# Patient Record
Sex: Male | Born: 1935 | Race: White | Hispanic: No | Marital: Single | Smoking: Former smoker
Health system: Southern US, Community
[De-identification: ages and names within clinical notes are randomized; demographics above are authoritative.]

## PROBLEM LIST (undated history)

## (undated) DIAGNOSIS — IMO0001 Reserved for inherently not codable concepts without codable children: Secondary | ICD-10-CM

## (undated) DIAGNOSIS — C801 Malignant (primary) neoplasm, unspecified: Secondary | ICD-10-CM

## (undated) DIAGNOSIS — K5730 Diverticulosis of large intestine without perforation or abscess without bleeding: Secondary | ICD-10-CM

## (undated) DIAGNOSIS — Z85118 Personal history of other malignant neoplasm of bronchus and lung: Secondary | ICD-10-CM

## (undated) DIAGNOSIS — H9190 Unspecified hearing loss, unspecified ear: Secondary | ICD-10-CM

## (undated) DIAGNOSIS — J449 Chronic obstructive pulmonary disease, unspecified: Secondary | ICD-10-CM

## (undated) DIAGNOSIS — Z87442 Personal history of urinary calculi: Secondary | ICD-10-CM

## (undated) DIAGNOSIS — E559 Vitamin D deficiency, unspecified: Secondary | ICD-10-CM

## (undated) DIAGNOSIS — D494 Neoplasm of unspecified behavior of bladder: Secondary | ICD-10-CM

## (undated) DIAGNOSIS — I499 Cardiac arrhythmia, unspecified: Secondary | ICD-10-CM

## (undated) DIAGNOSIS — T7840XA Allergy, unspecified, initial encounter: Secondary | ICD-10-CM

## (undated) DIAGNOSIS — E785 Hyperlipidemia, unspecified: Secondary | ICD-10-CM

## (undated) DIAGNOSIS — I10 Essential (primary) hypertension: Secondary | ICD-10-CM

## (undated) HISTORY — DX: Cardiac arrhythmia, unspecified: I49.9

## (undated) HISTORY — PX: PROSTATE SURGERY: SHX751

## (undated) HISTORY — PX: OTHER SURGICAL HISTORY: SHX169

## (undated) HISTORY — DX: Diverticulosis of large intestine without perforation or abscess without bleeding: K57.30

## (undated) HISTORY — DX: Essential (primary) hypertension: I10

## (undated) HISTORY — DX: Vitamin D deficiency, unspecified: E55.9

## (undated) HISTORY — DX: Allergy, unspecified, initial encounter: T78.40XA

---

## 1998-10-27 ENCOUNTER — Encounter: Payer: Self-pay | Admitting: Internal Medicine

## 1998-10-27 ENCOUNTER — Ambulatory Visit (HOSPITAL_COMMUNITY): Admission: RE | Admit: 1998-10-27 | Discharge: 1998-10-27 | Payer: Self-pay | Admitting: Internal Medicine

## 1999-11-16 ENCOUNTER — Ambulatory Visit (HOSPITAL_COMMUNITY): Admission: RE | Admit: 1999-11-16 | Discharge: 1999-11-16 | Payer: Self-pay | Admitting: Internal Medicine

## 1999-11-16 ENCOUNTER — Encounter: Payer: Self-pay | Admitting: Internal Medicine

## 2000-11-19 ENCOUNTER — Encounter: Payer: Self-pay | Admitting: Internal Medicine

## 2000-11-19 ENCOUNTER — Ambulatory Visit (HOSPITAL_COMMUNITY): Admission: RE | Admit: 2000-11-19 | Discharge: 2000-11-19 | Payer: Self-pay | Admitting: Internal Medicine

## 2001-12-31 ENCOUNTER — Encounter: Payer: Self-pay | Admitting: Internal Medicine

## 2001-12-31 ENCOUNTER — Ambulatory Visit (HOSPITAL_COMMUNITY): Admission: RE | Admit: 2001-12-31 | Discharge: 2001-12-31 | Payer: Self-pay | Admitting: Internal Medicine

## 2003-01-07 ENCOUNTER — Ambulatory Visit (HOSPITAL_COMMUNITY): Admission: RE | Admit: 2003-01-07 | Discharge: 2003-01-07 | Payer: Self-pay | Admitting: Internal Medicine

## 2004-01-14 ENCOUNTER — Ambulatory Visit (HOSPITAL_COMMUNITY): Admission: RE | Admit: 2004-01-14 | Discharge: 2004-01-14 | Payer: Self-pay | Admitting: Internal Medicine

## 2004-02-07 LAB — HM COLONOSCOPY: HM Colonoscopy: NEGATIVE

## 2004-04-21 ENCOUNTER — Ambulatory Visit: Payer: Self-pay | Admitting: Gastroenterology

## 2004-05-09 ENCOUNTER — Ambulatory Visit: Payer: Self-pay | Admitting: Gastroenterology

## 2004-05-09 HISTORY — PX: COLONOSCOPY: SHX174

## 2006-02-06 HISTORY — PX: LUNG REMOVAL, PARTIAL: SHX233

## 2006-08-22 ENCOUNTER — Ambulatory Visit (HOSPITAL_COMMUNITY): Admission: RE | Admit: 2006-08-22 | Discharge: 2006-08-22 | Payer: Self-pay | Admitting: Internal Medicine

## 2006-08-24 ENCOUNTER — Ambulatory Visit (HOSPITAL_COMMUNITY): Admission: RE | Admit: 2006-08-24 | Discharge: 2006-08-24 | Payer: Self-pay | Admitting: Internal Medicine

## 2006-08-31 ENCOUNTER — Ambulatory Visit (HOSPITAL_COMMUNITY): Admission: RE | Admit: 2006-08-31 | Discharge: 2006-08-31 | Payer: Self-pay | Admitting: Internal Medicine

## 2006-09-11 ENCOUNTER — Ambulatory Visit: Payer: Self-pay | Admitting: Thoracic Surgery

## 2006-09-21 ENCOUNTER — Inpatient Hospital Stay (HOSPITAL_COMMUNITY): Admission: RE | Admit: 2006-09-21 | Discharge: 2006-09-28 | Payer: Self-pay | Admitting: Thoracic Surgery

## 2006-09-21 ENCOUNTER — Encounter: Payer: Self-pay | Admitting: Thoracic Surgery

## 2006-09-21 ENCOUNTER — Ambulatory Visit: Payer: Self-pay | Admitting: Thoracic Surgery

## 2006-10-10 ENCOUNTER — Ambulatory Visit: Payer: Self-pay | Admitting: Thoracic Surgery

## 2006-10-10 ENCOUNTER — Encounter: Admission: RE | Admit: 2006-10-10 | Discharge: 2006-10-10 | Payer: Self-pay | Admitting: Thoracic Surgery

## 2006-10-10 ENCOUNTER — Ambulatory Visit: Payer: Self-pay | Admitting: Internal Medicine

## 2006-10-16 LAB — COMPREHENSIVE METABOLIC PANEL
ALT: 20 U/L (ref 0–53)
AST: 18 U/L (ref 0–37)
Albumin: 4.3 g/dL (ref 3.5–5.2)
Alkaline Phosphatase: 88 U/L (ref 39–117)
BUN: 15 mg/dL (ref 6–23)
CO2: 25 mEq/L (ref 19–32)
Calcium: 9.2 mg/dL (ref 8.4–10.5)
Chloride: 106 mEq/L (ref 96–112)
Creatinine, Ser: 0.83 mg/dL (ref 0.40–1.50)
Glucose, Bld: 96 mg/dL (ref 70–99)
Potassium: 4 mEq/L (ref 3.5–5.3)
Sodium: 140 mEq/L (ref 135–145)
Total Bilirubin: 0.5 mg/dL (ref 0.3–1.2)
Total Protein: 7 g/dL (ref 6.0–8.3)

## 2006-10-16 LAB — CBC WITH DIFFERENTIAL/PLATELET
Basophils Absolute: 0 10*3/uL (ref 0.0–0.1)
EOS%: 2.8 % (ref 0.0–7.0)
HCT: 36.1 % — ABNORMAL LOW (ref 38.7–49.9)
HGB: 12.8 g/dL — ABNORMAL LOW (ref 13.0–17.1)
MCH: 32.6 pg (ref 28.0–33.4)
MCV: 92.3 fL (ref 81.6–98.0)
MONO%: 7.4 % (ref 0.0–13.0)
NEUT%: 44 % (ref 40.0–75.0)

## 2006-11-07 ENCOUNTER — Ambulatory Visit: Payer: Self-pay | Admitting: Thoracic Surgery

## 2006-11-07 ENCOUNTER — Encounter: Admission: RE | Admit: 2006-11-07 | Discharge: 2006-11-07 | Payer: Self-pay | Admitting: Thoracic Surgery

## 2007-01-09 ENCOUNTER — Ambulatory Visit: Payer: Self-pay | Admitting: Thoracic Surgery

## 2007-01-09 ENCOUNTER — Encounter: Admission: RE | Admit: 2007-01-09 | Discharge: 2007-01-09 | Payer: Self-pay | Admitting: Thoracic Surgery

## 2007-04-05 ENCOUNTER — Ambulatory Visit: Payer: Self-pay | Admitting: Internal Medicine

## 2007-04-09 LAB — CBC WITH DIFFERENTIAL/PLATELET
Basophils Absolute: 0.1 10*3/uL (ref 0.0–0.1)
EOS%: 1.2 % (ref 0.0–7.0)
HCT: 40 % (ref 38.7–49.9)
HGB: 13.9 g/dL (ref 13.0–17.1)
MCH: 31.7 pg (ref 28.0–33.4)
MCV: 91.1 fL (ref 81.6–98.0)
MONO%: 7.8 % (ref 0.0–13.0)
NEUT%: 47.5 % (ref 40.0–75.0)
RDW: 13.1 % (ref 11.2–14.6)

## 2007-04-09 LAB — COMPREHENSIVE METABOLIC PANEL
AST: 21 U/L (ref 0–37)
Alkaline Phosphatase: 80 U/L (ref 39–117)
BUN: 14 mg/dL (ref 6–23)
Creatinine, Ser: 0.86 mg/dL (ref 0.40–1.50)

## 2007-04-11 ENCOUNTER — Ambulatory Visit (HOSPITAL_COMMUNITY): Admission: RE | Admit: 2007-04-11 | Discharge: 2007-04-11 | Payer: Self-pay | Admitting: Internal Medicine

## 2007-04-24 ENCOUNTER — Ambulatory Visit: Payer: Self-pay | Admitting: Thoracic Surgery

## 2007-10-04 ENCOUNTER — Ambulatory Visit: Payer: Self-pay | Admitting: Internal Medicine

## 2007-10-09 LAB — CBC WITH DIFFERENTIAL/PLATELET
BASO%: 0.7 % (ref 0.0–2.0)
Basophils Absolute: 0 10*3/uL (ref 0.0–0.1)
EOS%: 1.8 % (ref 0.0–7.0)
HGB: 13.6 g/dL (ref 13.0–17.1)
MCH: 32.7 pg (ref 28.0–33.4)
MCHC: 34.6 g/dL (ref 32.0–35.9)
MCV: 94.4 fL (ref 81.6–98.0)
MONO%: 7.7 % (ref 0.0–13.0)
RBC: 4.16 10*6/uL — ABNORMAL LOW (ref 4.20–5.71)
RDW: 12.9 % (ref 11.2–14.6)

## 2007-10-09 LAB — COMPREHENSIVE METABOLIC PANEL
ALT: 21 U/L (ref 0–53)
AST: 23 U/L (ref 0–37)
Albumin: 4.4 g/dL (ref 3.5–5.2)
Alkaline Phosphatase: 90 U/L (ref 39–117)
BUN: 12 mg/dL (ref 6–23)
Potassium: 4.1 mEq/L (ref 3.5–5.3)

## 2007-10-11 ENCOUNTER — Ambulatory Visit (HOSPITAL_COMMUNITY): Admission: RE | Admit: 2007-10-11 | Discharge: 2007-10-11 | Payer: Self-pay | Admitting: Internal Medicine

## 2007-10-30 ENCOUNTER — Ambulatory Visit: Payer: Self-pay | Admitting: Thoracic Surgery

## 2008-04-03 ENCOUNTER — Ambulatory Visit: Payer: Self-pay | Admitting: Internal Medicine

## 2008-04-07 ENCOUNTER — Ambulatory Visit (HOSPITAL_COMMUNITY): Admission: RE | Admit: 2008-04-07 | Discharge: 2008-04-07 | Payer: Self-pay | Admitting: Internal Medicine

## 2008-04-07 LAB — CBC WITH DIFFERENTIAL/PLATELET
EOS%: 1.6 % (ref 0.0–7.0)
HGB: 13.7 g/dL (ref 13.0–17.1)
MCH: 32.3 pg (ref 27.2–33.4)
MCV: 93.9 fL (ref 79.3–98.0)
MONO%: 6.8 % (ref 0.0–14.0)
NEUT#: 3.4 10*3/uL (ref 1.5–6.5)
RBC: 4.24 10*6/uL (ref 4.20–5.82)
RDW: 13 % (ref 11.0–14.6)
lymph#: 3 10*3/uL (ref 0.9–3.3)

## 2008-04-07 LAB — COMPREHENSIVE METABOLIC PANEL
ALT: 25 U/L (ref 0–53)
AST: 24 U/L (ref 0–37)
Albumin: 3.8 g/dL (ref 3.5–5.2)
Alkaline Phosphatase: 76 U/L (ref 39–117)
Calcium: 9.3 mg/dL (ref 8.4–10.5)
Chloride: 106 mEq/L (ref 96–112)
Potassium: 3.8 mEq/L (ref 3.5–5.3)
Sodium: 138 mEq/L (ref 135–145)
Total Protein: 6.6 g/dL (ref 6.0–8.3)

## 2008-10-05 ENCOUNTER — Ambulatory Visit: Payer: Self-pay | Admitting: Internal Medicine

## 2008-10-07 ENCOUNTER — Ambulatory Visit (HOSPITAL_COMMUNITY): Admission: RE | Admit: 2008-10-07 | Discharge: 2008-10-07 | Payer: Self-pay | Admitting: Internal Medicine

## 2008-10-07 LAB — CBC WITH DIFFERENTIAL/PLATELET
Basophils Absolute: 0 10*3/uL (ref 0.0–0.1)
Eosinophils Absolute: 0.2 10*3/uL (ref 0.0–0.5)
HGB: 13.1 g/dL (ref 13.0–17.1)
MONO%: 9.5 % (ref 0.0–14.0)
NEUT#: 1.8 10*3/uL (ref 1.5–6.5)
RBC: 4.11 10*6/uL — ABNORMAL LOW (ref 4.20–5.82)
RDW: 13.4 % (ref 11.0–14.6)
WBC: 4.9 10*3/uL (ref 4.0–10.3)
lymph#: 2.4 10*3/uL (ref 0.9–3.3)

## 2008-10-07 LAB — COMPREHENSIVE METABOLIC PANEL
AST: 27 U/L (ref 0–37)
Albumin: 3.9 g/dL (ref 3.5–5.2)
Alkaline Phosphatase: 77 U/L (ref 39–117)
BUN: 12 mg/dL (ref 6–23)
Calcium: 9.2 mg/dL (ref 8.4–10.5)
Chloride: 110 mEq/L (ref 96–112)
Glucose, Bld: 99 mg/dL (ref 70–99)
Potassium: 3.7 mEq/L (ref 3.5–5.3)
Sodium: 142 mEq/L (ref 135–145)
Total Protein: 6.8 g/dL (ref 6.0–8.3)

## 2008-10-21 ENCOUNTER — Ambulatory Visit: Payer: Self-pay | Admitting: Thoracic Surgery

## 2008-10-28 ENCOUNTER — Encounter: Payer: Self-pay | Admitting: Thoracic Surgery

## 2008-10-28 ENCOUNTER — Ambulatory Visit (HOSPITAL_COMMUNITY): Admission: RE | Admit: 2008-10-28 | Discharge: 2008-10-28 | Payer: Self-pay | Admitting: Thoracic Surgery

## 2008-10-28 ENCOUNTER — Ambulatory Visit: Payer: Self-pay | Admitting: Thoracic Surgery

## 2008-10-28 HISTORY — PX: FIBEROPTIC BRONCHOSCOPY: SHX5367

## 2008-11-03 ENCOUNTER — Ambulatory Visit: Payer: Self-pay | Admitting: Thoracic Surgery

## 2009-01-02 ENCOUNTER — Emergency Department (HOSPITAL_COMMUNITY): Admission: EM | Admit: 2009-01-02 | Discharge: 2009-01-02 | Payer: Self-pay | Admitting: Emergency Medicine

## 2009-04-06 ENCOUNTER — Ambulatory Visit: Payer: Self-pay | Admitting: Internal Medicine

## 2009-04-09 ENCOUNTER — Ambulatory Visit (HOSPITAL_COMMUNITY): Admission: RE | Admit: 2009-04-09 | Discharge: 2009-04-09 | Payer: Self-pay | Admitting: Internal Medicine

## 2009-04-09 LAB — COMPREHENSIVE METABOLIC PANEL
CO2: 28 mEq/L (ref 19–32)
Calcium: 9.1 mg/dL (ref 8.4–10.5)
Chloride: 106 mEq/L (ref 96–112)
Creatinine, Ser: 0.98 mg/dL (ref 0.40–1.50)
Glucose, Bld: 106 mg/dL — ABNORMAL HIGH (ref 70–99)
Sodium: 141 mEq/L (ref 135–145)
Total Bilirubin: 0.7 mg/dL (ref 0.3–1.2)
Total Protein: 6.9 g/dL (ref 6.0–8.3)

## 2009-04-09 LAB — CBC WITH DIFFERENTIAL/PLATELET
Eosinophils Absolute: 0.2 10*3/uL (ref 0.0–0.5)
HCT: 39.5 % (ref 38.4–49.9)
LYMPH%: 46.4 % (ref 14.0–49.0)
MONO#: 0.5 10*3/uL (ref 0.1–0.9)
NEUT#: 2.8 10*3/uL (ref 1.5–6.5)
NEUT%: 43.1 % (ref 39.0–75.0)
Platelets: 194 10*3/uL (ref 140–400)
WBC: 6.5 10*3/uL (ref 4.0–10.3)
lymph#: 3 10*3/uL (ref 0.9–3.3)

## 2009-04-21 ENCOUNTER — Ambulatory Visit: Payer: Self-pay | Admitting: Thoracic Surgery

## 2009-10-06 ENCOUNTER — Ambulatory Visit: Payer: Self-pay | Admitting: Internal Medicine

## 2009-10-08 ENCOUNTER — Ambulatory Visit (HOSPITAL_COMMUNITY): Admission: RE | Admit: 2009-10-08 | Discharge: 2009-10-08 | Payer: Self-pay | Admitting: Internal Medicine

## 2009-10-08 LAB — CBC WITH DIFFERENTIAL/PLATELET
Basophils Absolute: 0 10*3/uL (ref 0.0–0.1)
Eosinophils Absolute: 0.1 10*3/uL (ref 0.0–0.5)
HCT: 39.3 % (ref 38.4–49.9)
LYMPH%: 46.9 % (ref 14.0–49.0)
MCHC: 34.3 g/dL (ref 32.0–36.0)
MONO#: 0.5 10*3/uL (ref 0.1–0.9)
NEUT#: 2.4 10*3/uL (ref 1.5–6.5)
NEUT%: 42.3 % (ref 39.0–75.0)
Platelets: 188 10*3/uL (ref 140–400)
WBC: 5.8 10*3/uL (ref 4.0–10.3)

## 2009-10-08 LAB — COMPREHENSIVE METABOLIC PANEL
BUN: 11 mg/dL (ref 6–23)
CO2: 28 mEq/L (ref 19–32)
Creatinine, Ser: 0.89 mg/dL (ref 0.40–1.50)
Glucose, Bld: 98 mg/dL (ref 70–99)
Total Bilirubin: 0.8 mg/dL (ref 0.3–1.2)

## 2010-02-06 HISTORY — PX: LITHOTRIPSY: SUR834

## 2010-02-26 ENCOUNTER — Other Ambulatory Visit: Payer: Self-pay | Admitting: Internal Medicine

## 2010-02-26 DIAGNOSIS — C3490 Malignant neoplasm of unspecified part of unspecified bronchus or lung: Secondary | ICD-10-CM

## 2010-02-27 ENCOUNTER — Encounter: Payer: Self-pay | Admitting: Thoracic Surgery

## 2010-02-27 ENCOUNTER — Encounter: Payer: Self-pay | Admitting: Sports Medicine

## 2010-05-13 LAB — COMPREHENSIVE METABOLIC PANEL
ALT: 23 U/L (ref 0–53)
AST: 29 U/L (ref 0–37)
Albumin: 4 g/dL (ref 3.5–5.2)
Alkaline Phosphatase: 77 U/L (ref 39–117)
CO2: 28 mEq/L (ref 19–32)
Chloride: 107 mEq/L (ref 96–112)
Creatinine, Ser: 0.85 mg/dL (ref 0.4–1.5)
GFR calc Af Amer: >60 mL/min (ref >60)
GFR calc non Af Amer: >60 mL/min (ref >60)
Potassium: 4 mEq/L (ref 3.5–5.1)
Sodium: 143 mEq/L (ref 135–145)
Total Bilirubin: 0.7 mg/dL (ref 0.3–1.2)

## 2010-05-13 LAB — CBC
MCV: 94.5 fL (ref 78.0–100.0)
Platelets: 167 10*3/uL (ref 150–400)
RBC: 4.14 MIL/uL — ABNORMAL LOW (ref 4.22–5.81)
WBC: 6.2 10*3/uL (ref 4.0–10.5)

## 2010-05-13 LAB — CULTURE, RESPIRATORY W GRAM STAIN

## 2010-06-21 NOTE — Letter (Signed)
October 10, 2006   Lucky Cowboy, M.D.  [address], [address]  [place], [place] [address]   Re:  KEMOND, AMORIN              DOB:  03-18-1935   Dear Oneta Rack:   I saw the patient back today.  He is stage IA non-small cell lung cancer  in which we did a wedge resection and node dissection on him.  His  cancer was a T1 NO MO.  His incisions are well-healed.  He is doing well  overall.  His chest x-ray showed normal postoperative changes.  I am  referring to Dr. Arbutus Ped for his evaluation and we will see him back  again in 4 weeks with a chest x-ray.   Sincerely,   Ines Bloomer, M.D.  Electronically Signed   DPB/MEDQ  D:  10/10/2006  T:  10/10/2006  Job:  [id number]

## 2010-06-21 NOTE — Discharge Summary (Signed)
NAME:  Raymond Patrick, Raymond Patrick NO.:  [id number]   MEDICAL RECORD NO.:  [id number]          PATIENT TYPE:  INP   LOCATION:  2035                         FACILITY:  [facility]   PHYSICIAN:  Ines Bloomer, M.D. DATE OF BIRTH:  01-13-1936   DATE OF ADMISSION:  09/21/2006  DATE OF DISCHARGE:  09/28/2006                               DISCHARGE SUMMARY   HISTORY OF PRESENT ILLNESS:  The patient is a 75 year old male, former  smoker, who was referred to Dr. Edwyna Shell in thoracic surgical opinion due  to findings of a right upper lobe lesion.  Multiple studies were  obtained, including CT and PET scan.  The PET scan showed an SUV of 3.3  and a right upper lobe lesion.  Additionally, there was a small  subcarinal node that had an SUV of 2.9 that was felt not to be  significant.  It was Dr. Scheryl Darter opinion that he would best be served  by surgical resection, and he was admitted at this hospitalization for  the procedure.   PAST MEDICAL HISTORY:  1. Hypercholesterolemia.  2. Mild chronic obstructive pulmonary disease.  3. Benign prostatic hyperplasia.   MEDICATIONS PRIOR TO ADMISSION:  1. Lipitor 10 mg daily.  2. Avodart 0.5 mg daily.  3. Baby aspirin 81 mg daily.  4. Terazosin 10 mg q.h.s.  5. Vitamins.  6. Lorazepam 7.5 mg once or twice weekly.   For family history, social history, review of systems, and physical  exam, please see the history and physical done at the time of admission.   HOSPITAL COURSE:  The patient was admitted electively, and on September 21, 2006 he was taken to the operating room where he underwent the following  procedure:  Right video-assisted thoracoscopy with mini thoracotomy and  wedge resection of the right upper lobe with lymph node biopsy.  He  tolerated this procedure well and was taken to the postanesthesia care  unit in stable condition.   Postoperatively, overall he has done well.  He has maintained stable  hemodynamics.  His incisions are  healing well, without evidence of  infection.  He has been weaned from oxygen and maintains good  saturations on room air.  His pathology has returned, and it revealed an  adenocarcinoma, acinar, moderately differentiated, 1.2 cm.  The tumor  extends but not through the visceral pleura.  His parenchymal margin was  negative for tumor.  Also findings were significant for emphysematous  changes.  Multiple lymph node biopsies at level 10R, level 4, and level  11R were all negative for tumor.   The patient did have difficulty with postoperative voiding.  A urology  consultation has been obtained, and he will require a Foley at the time  of discharge due to significant retention with overflow.  Urinalysis was  negative.   Currently, he is felt to be tentatively stable for transfer to a nursing  facility on today's date, September 28, 2006.   DISCHARGE MEDICATIONS:  1. Lipitor 10 mg daily.  2. Aspirin 81 mg daily.  3. Niacin 500 mg two times daily.  4. Avodart 0.5  mg daily.  5. Hytrin 10 mg daily.  6. For pain, Tylox 1-2 q.6h. as needed.   DISCHARGE INSTRUCTIONS:  The patient received written instructions  regarding medications, activity, diet, wound care, and followup.  Followup will include Dr. Edwyna Shell in 2 weeks.  The office will arrange  this appointment.  Initially at that time he will have a chest x-ray  taken prior to his appointment to see Dr. Edwyna Shell.   </FINAL DIAGNOSIS>  Lung cancer, as described by above report of pathology, status post  right upper lobe wedge resection.   OTHER DIAGNOSES:  1. Benign prostatic hyperplasia with postoperative urinary retention,      now with Foley in place to be managed as an outpatient with voiding      trials by Dr. Lenoria Chime office.  His office number is [phone].      He is instructed to call for an appointment next week.  2. Hyperlipidemia.  3. Chronic obstructive pulmonary disease.  4. Postoperative acute blood loss anemia.  Most  recent hemoglobin and      hematocrit dated September 26, 2006 is 10.2 and 29.6, respectively.   WOUND CARE:  The patient may clean his incisions gently with soap and  water.  The sutures will be removed from the chest tube site prior to  discharge.      Rowe Clack, P.A.-C.      Ines Bloomer, M.D.  Electronically Signed    WEG/MEDQ  D:  09/28/2006  T:  09/28/2006  Job:  [id number]   cc:   Ines Bloomer, M.D.  Bertram Millard. Dahlstedt, M.D.

## 2010-06-21 NOTE — Assessment & Plan Note (Signed)
OFFICE VISIT   Raymond, Patrick  DOB:  Nov 19, 1935                                        November 07, 2006  CHART #:  [id number]   [name] returned today and his blood pressure was 121/74, pulse 90.  Respirations were 18. Sats were 94%. Incisions were well healed. Chest x-  ray showed normal postoperative changes. He is doing well six weeks  after his surgery. Will see him back again in two months.   LUNGS:  Clear to auscultation and percussion.   Raymond Patrick, M.D.  Electronically Signed   DPB/MEDQ  D:  11/07/2006  T:  11/07/2006  Job:  [id number]

## 2010-06-21 NOTE — H&P (Signed)
NAME:  Raymond Patrick, Raymond Patrick NO.:  [id number]   MEDICAL RECORD NO.:  [id number]          PATIENT TYPE:  INP   LOCATION:  NA                           FACILITY:  [facility]   PHYSICIAN:  Ines Bloomer, M.D. DATE OF BIRTH:  23-Sep-1935   DATE OF ADMISSION:  09/19/2006  DATE OF DISCHARGE:                              HISTORY & PHYSICAL   HISTORY OF PRESENT ILLNESS:  This 75 year old patient quit smoking two  years ago but was found to have a normal right upper lobe lesion with no  adenopathy.  A PET scan showed an SUV of 3.3 and a right upper lobe  lesion.  There was a small subcarinal node that had an SUV of 2.9 that  was felt to be not significant.  The pulmonary function tests showed an  FVC of 2.68 with an FEV-1 of 1.73.  He has had no hemoptysis, fevers,  chills, excessive sputum, or weight loss.   PAST MEDICAL HISTORY:  The past medical history is significant for  hypercholesterolemia and mild chronic obstructive pulmonary disease.   MEDICATIONS:  The medications include Lipitor 10 mg a day, Avodart 0.5  every other day, baby aspirin, terazosin 10 mg, one at night, aspirin 81  mg a day, and vitamins.  The patient also takes lorazepam 7.5 mg once or  twice weekly.   FAMILY HISTORY:  The family history is negative for heart disease and  cancer.   SOCIAL HISTORY:  The patient is single and is retired, quit smoking two  years ago, has a 50 pack year history.  The patient does not drink  alcohol on a regular basis.   REVIEW OF SYSTEMS:  GENERAL:  He is 165 pounds and he is 5 feet 7  inches.  CARDIAC:  No angina or atrial fibrillation.  PULMONARY:  No  wheezing, see history of present illness.  GASTROINTESTINAL:  No  gastroesophageal reflux disease, nausea, vomiting, constipation, or  diarrhea.  Previous history of diverticulosis though and hyperplastic  sigmoid polyps.  GU:  The patient has prostate disease.  No kidney  disease.  No dysuria but frequent urination.   VASCULAR:  No  claudication, deep venous thrombosis, or transient ischemic attacks.  NEUROLOGICAL:  No dizziness, headaches, blackouts, or seizures.  MUSCULOSKELETAL:  No joint pain.  PSYCHIATRIC:  No psychiatric  illnesses.  EENT:  No change in her eyesight or hearing.  HEMATOLOGICAL:  No problems with bleeding or clotting disorders.   PHYSICAL EXAMINATION:  VITAL SIGNS:  Blood pressure 135/78, pulse 81,  respirations 18, and sats were 94%.  GENERAL:  He is a thin Caucasian male in no acute distress.  HEAD:  The head is atraumatic.  EYES:  Pupils equal, reactive to light and accommodation.  Extraocular  movements are normal.  EARS:  Tympanic membranes intact.  NOSE:  There is no septal deviation.  THROAT:  The throat is without lesion.  The uvula is in the midline.  NECK:  The neck is supple without thyromegaly.  There is no  supraclavicular or axillary adenopathy, no carotid bruits.  CHEST:  The chest is  clear to auscultation and percussion.  HEART:  Regular sinus rhythm, no murmurs.  ABDOMEN:  The abdomen is soft.  There is no hepatosplenomegaly, pulse  are 2+.  EXTREMITIES:  There is no clubbing or edema.  NEUROLOGICAL:  He is oriented times three.  Sensory and motor are  intact.  Cranial nerves are intact.   IMPRESSION:  1. Right upper lobe mass, probable non-small cell lung cancer, stage      1A or 1B.  2. History of tobacco abuse.  3. Benign prostatic hypertrophy.  4. Diverticulosis.   PLAN:  The plan is for a possible right vats, possible right upper  lobectomy.      Ines Bloomer, M.D.  Electronically Signed     DPB/MEDQ  D:  09/19/2006  T:  09/20/2006  Job:  [id number]   cc:   Ines Bloomer, M.D.

## 2010-06-21 NOTE — Assessment & Plan Note (Signed)
OFFICE VISIT   Raymond Patrick, Raymond Patrick  DOB:  15-Aug-1935                                        April 21, 2009  CHART #:  [id number]   REASON FOR OFFICE VISIT:  Further surveillance for adenocarcinoma of the  right upper lobe (status post right upper lobe wedge resection on September 21, 2006.)   HISTORY OF PRESENT ILLNESS:  This is a 75 year old Caucasian male who  was found to have a right upper lobe lung mass.  He underwent right  VATS, right minithoracotomy, wedge resection of right upper lobe as well  as lymph node sampling by Dr. Edwyna Shell on September 21, 2006.  Pathology was  consistent with adenocarcinoma.  The lymph nodes were negative for  tumor.  The patient has been closely followed by both Dr. Edwyna Shell and Dr.  Arbutus Ped over the last couple of years.  He was last seen in the office  on November 03, 2008.  The patient had a recent CT scan which showed no  evidence recurrence of his cancer, but it did show questionable nodule  at the orifices of the right mainstem bronchus (mucus plug versus  nodule).  The patient underwent a bronchoscopy on October 28, 2008.  Pathology showed no malignant cells seen.  He is without complaints at  this office visit.  He has had a CAT scan of the chest done on April 09, 2009.  Again, no findings of recurrent malignancy.  In addition, he has  already been seen by Dr. Arbutus Ped last week.   PHYSICAL EXAMINATION:  GENERAL:  This is a pleasant 73-year Caucasian  male who is in no acute distress, who is alert and cooperative.  VITAL SIGNS:  Latest vital signs are as follows:  BP 113/75, pulse rate  84, respirations 18, O2 saturation 94% on room air.  CARDIOVASCULAR:  Regular rate and rhythm.  S1, S2 without murmurs,  gallops, or rubs.  PULMONARY:  Clear to auscultation bilaterally.  No rales, wheeze, or  rhonchi.  ABDOMEN:  Soft, nontender.   IMPRESSION AND PLAN:  The patient continues to show no evidence of  recurrent  malignancy from previous right upper lobe lung cancer.  He  will be seen on a p.r.n. basis by Dr. Edwyna Shell,  however, continue to be followed closely by Dr. Arbutus Ped.  The patient  has an appointment to see Dr. Arbutus Ped with a chest x-ray in September.  He is to contact our office if he has any further problems or questions.   Doree Fudge, PA   DZ/MEDQ  D:  04/21/2009  T:  04/22/2009  Job:  [id number]

## 2010-06-21 NOTE — Consult Note (Signed)
NAME:  Raymond Patrick, HERREN NO.:  [id number]   MEDICAL RECORD NO.:  [id number]          PATIENT TYPE:  INP   LOCATION:  2035                         FACILITY:  [facility]   PHYSICIAN:  Mark C. Vernie Ammons, M.D.  DATE OF BIRTH:  1935/07/10   DATE OF CONSULTATION:  DATE OF DISCHARGE:                                 CONSULTATION   Mr. Musial is a pleasant 75 year old white male patient of Dr.  Retta Diones, who was found to have a right upper lobe abnormality, and  underwent right upper lobe lobectomy.  He was found to have non-small  cell cancer. He is a patient of Dr. Lenoria Chime, who has a longstanding  history of benign prostatic hypertrophy with bladder outlet obstruction.  He has been on maximum medical management, on terazosin 10 mg and  Avodart 0.5 mg, and had been voiding without difficulty prior to his  admission.  He then underwent surgery and left the operating room with a  Foley catheter, and when that was removed 2 days ago he began to have  difficulty with significant urinary frequency, some urgency, a little  mild initial dysuria, and small, frequent voidings throughout the day.  At night, he said that he was getting up every hour.  If he took his  pain medicine, it would allow him to sleep but he would have nocturnal  enuresis.  He has not seen any hematuria. He does have a prior history  of microscopic hematuria, which has been worked up by Dr. Retta Diones, and  no significant abnormality was found.  He has no prior history of  urinary retention either.   PAST MEDICAL HISTORY:  Positive for hypercholesterolemia and chronic  obstructive pulmonary disease.   MEDICATIONS:  Lipitor, Avodart, terazosin, aspirin, vitamins, as well as  lorazepam.   ALLERGIES:  No known drug allergies.   FAMILY HISTORY:  Negative for true malignancy, renal disease.   SOCIAL HISTORY:  The patient is retired, single, and used to smoke.  He  has a 50 pack/year smoking history, and denies  alcohol use.   REVIEW OF SYSTEMS:  Negative, other than as noted above.   EXAMINATION:  VITAL SIGNS:  Temperature 98.8, blood pressure 125/75,  pulse 88.  GENERAL:  Well-developed, well-nourished white male in no apparent  distress.  HEENT:  Normocephalic, atraumatic.  Oropharynx clear.  NECK:  Supple, with midline trachea.  CHEST:  Normal respiratory effort.  CARDIOVASCULAR:  Regular rate and rhythm.  ABDOMEN:  Soft, nontender, without mass or hepatosplenomegaly.  Normal  phallus with normal glans, meatus.  His scrotum is normal, his testicles  are descended bilaterally.  There is mild atrophy of the right testicle.  . There is a large left varicocele noted.  He has normal anus and  perineum and normal rectal tone.  His prostate is 2+ to 3+ enlarged,  smooth, symmetric, and has no suspicious nodularity, induration, and no  seminal vesicle abnormalities.  SKIN:  Warm and dry.  EXTREMITIES:  Without clubbing, cyanosis or edema.  NEUROLOGIC:  He is alert and oriented with appropriate mood and affect,  and has no gross  focal neurologic deficit.   LABORATORY RESULTS:  Creatinine normal at 1.4.  His urinalysis on  admission had 21-50 red cells, otherwise negative.   IMPRESSION:  1. Benign prostatic hypertrophy with outlet obstruction on maximum      medical management with 10 mg of terazosin and 0.5 mg of Avodart.  2. Frequent small voids and nocturnal enuresis.  His history is      consistent with possible retention with overflow.  Need to rule      this out with catheterization to check a PVR.  3. His right testicular atrophy is of no significance.  4. He has a grade 4 left varicocele that is asymptomatic.  5. He has microscopic hematuria that has been worked up in the past by      Dr. Retta Diones.   PLAN:  1. Would continue current benign prostatic hypertrophy medications.  2. Send urine for UA.  3. I & O catheterization, and leave Foley if his residual is greater      than  250.  4. He will be able to go in the morning, and follow with Dr. Retta Diones      as an outpatient next week if his catheter needs to be left in      place.      Mark C. Vernie Ammons, M.D.  Electronically Signed     MCO/MEDQ  D:  09/27/2006  T:  09/27/2006  Job:  [id number]   cc:   Ines Bloomer, M.D.

## 2010-06-21 NOTE — Letter (Signed)
October 21, 2008   Mohamed K. Arbutus Ped, MD  [address]  [place], [place] [address]   Re:  Raymond Patrick, Raymond Patrick              DOB:  1935/02/25   Dear Arbutus Ped,   I saw the patient back today.  His CT scan done recently showed evidence  of recurrence of his cancer, but it did show a questionable nodule at  the orifices of the right mainstem bronchus, it could be mucus or could  be a definite nodule.  Because of this, I think he does need to have a  bronchoscopy, and I have scheduled this for September 22 at [facility].  I will let you know the result.   Ines Bloomer, M.D.  Electronically Signed   DPB/MEDQ  D:  10/21/2008  T:  10/22/2008  Job:  [id number]

## 2010-06-21 NOTE — Op Note (Signed)
NAME:  Raymond Patrick, Raymond Patrick NO.:  [id number]   MEDICAL RECORD NO.:  [id number]          PATIENT TYPE:  INP   LOCATION:  3316                         FACILITY:  [facility]   PHYSICIAN:  Ines Bloomer, M.D. DATE OF BIRTH:  1935/03/03   DATE OF PROCEDURE:  09/21/2006  DATE OF DISCHARGE:                               OPERATIVE REPORT   PREOPERATIVE DIAGNOSIS:  Right upper lobe mass.   POSTOPERATIVE DIAGNOSIS:  1. Non-small cell lung cancer right upper lobe.  2. Severe chronic obstructive pulmonary disease.   DESCRIPTION OF PROCEDURE:  After percutaneous insertion of all  monitoring lines, the patient was turned to the right lateral  thoracotomy position; and was prepped and draped in the usual sterile  manner.  Two trocar sites were made and a dual-lumen tube was inserted.  The right lung was deflated.  Two trocar sites were made in the anterior  and posterior axillary line in the seventh intercostal space.  Two  trocars were inserted.  The lesion was seen in the posterior segment of  the right upper lobe.   A small incision was made over the triangle of auscultation partially  dividing the latissimus and entering the triangle of auscultation to the  fifth intercostal space.  Through the fifth intercostal space we  palpated the lesion in the posterior segment of the right upper lobe.  Then coming anteriorly with a auto suture 60-stapler which required  several applications to do a wide resection of this lesion.  We chose to  do this rather than lobectomy because the tumor was small in size; and  the patient had a moderate-to-severe COPD.  After the lesion had been  resected, we then dissected out the hilum, dissecting out several 10-R,  4-R, and 11-R nodes from around the hilum.  They appeared to be normal  nodes.   Two chest tubes were brought into the trocar and tied; and tied in place  with #0 silk.  A Marcaine block was done in the usual fashion.  The  single OnQue  was inserted in the usual fashion. The chest was closed  with 1 pericostal drilling through the sixth rib and passed around the  fifth.  The area was closed with #1 Vicryl, 2-0 Vicryl in the  subcutaneous tissues, and Dermabond for the skin.  The patient was  returned to the recovery room in stable condition.      Ines Bloomer, M.D.  Electronically Signed     DPB/MEDQ  D:  09/21/2006  T:  09/21/2006  Job:  [id number]

## 2010-06-21 NOTE — Assessment & Plan Note (Signed)
OFFICE VISIT   [name], [name]  DOB:  02-Mar-1935                                        January 09, 2007  CHART #:  [id number]   [name] came for followup.  He is 3 months after his surgery.  He is  doing well overall.  His blood pressure is 113/77, pulse 90,  respirations 18, saturations are 92%.  He has a CT scan scheduled in  March.  His chest x-ray today showed no evidence of recurrent cancer.   Overall, he is doing well and I plan to see him back again in March  after his CT scan.   Ines Bloomer, M.D.  Electronically Signed   DPB/MEDQ  D:  01/09/2007  T:  01/09/2007  Job:  [id number]

## 2010-06-21 NOTE — Letter (Signed)
November 03, 2008   Lajuana Matte, MD  [phone] [address]  [place], [place] [address]   Re:  LEMONTE, AL              DOB:  [birth year]   Dear Dr. Shirline Frees:   The patient returned today after his bronchoscopy, and his biopsies and  washings were all negative.  There was nothing but probably a mucus plug  at the takeoff of his left upper lobe, I have informed the patient of  this.  Today, his blood pressure was 100/60, pulse 80, respirations 18,  sats were 95%.  I plan to see him back again in 6 months with another CT  scan.   Sincerely,   Ines Bloomer, M.D.  Electronically Signed   DPB/MEDQ  D:  11/03/2008  T:  11/04/2008  Job:  [id number]

## 2010-06-21 NOTE — Letter (Signed)
September 12, 2006   Lucky Cowboy, M.D.  [address]  [place], [place] [address]   Re:  JERUSALEM, Raymond Patrick              DOB:  Jan 08, 1936   Dear Annette Stable:   I saw Mr. Gehret in the office today. I appreciate you sending him for  consultation. This 75 year old patient quit smoking 2 years ago and was  found on chest x-ray to have a right upper lobe lesion, a CT scan  revealed a right upper lobe lesion with no adenopathy. A PET scan was  done that showed an SUV of 3.3 in the right upper lobe lesion with  __________; there is 1 small subcarinal node that had an SUV of 2.9 but  is probably not significant. His pulmonary function test showed an FVC  of 2.68 with a an FEV 1 of 1.73. He has had no hemoptysis, fevers,  chills, excessive sputum, or weight loss.   PAST MEDICAL HISTORY:  Significant for hypercholesterolemia and some  mild chronic obstructive pulmonary disease.   His medications include:  1. __________ 10 mg a day.  2. Lipitor 10 mg every other day.  3. Baby aspirin 81 mg a day.  4. Niacin SR 500 mg twice a day.  5. Avodart 0.5 daily.  6. Lorazepam 7.5 mg once or twice weekly.   FAMILY HISTORY:  Negative for heart disease and cancers.   SOCIAL HISTORY:  He is single. He is retired. Quit smoking 2 years ago  but smoked for 50 years. Does not drink alcohol on a regular basis.   REVIEW OF SYSTEMS:  His weight is 165 pounds, he is 5 feet 7 inches.  CARDIAC: No angina or atrial fibrillation.  PULMONARY: No wheezing. See history of present illness.  GI: No GERD, nausea, or vomiting, constipation, or diarrhea. He has had  previous history of diverticulosis and hyperplastic sigmoid polyps.  GU: No prostate disease.  VASCULAR: No claudication, DVT, TIAs.  NEUROLOGICAL: No dizziness, headaches, blackouts, or seizures.  MUSCULOSKELETAL: No joint pain or lesion.  No psychiatric illnesses. No changes in eye sight or hearing. No  problems with bleeding or clotting  disorders.   PHYSICAL EXAMINATION:  Blood pressure 125/78, pulse 81, respirations 18,  sats were 94%. HEAD, EYES, EARS, NOSE, AND THROAT: Unremarkable. NECK:  Supple without thyromegaly. There is no supraclavicular or axillary  adenopathy. No carotid bruits. CHEST: Clear to auscultation and  percussion. HEART: Regular sinus rhythm. ABDOMEN: Soft. There is no  hepatosplenomegaly. Pulses are 2 + . There is no clubbing or edema.   IMPRESSION:  1. Right upper lobe lesion, positive PET scan, rule out cancer.  2. Chronic obstructive pulmonary disease.  3. History of tobacco abuse.  4. Dyslipidemia.  5. Hypertension.   PLAN:  Right VATS, right segmentectomy or right upper lobectomy.   Ines Bloomer, M.D.  Electronically Signed   DPB/MEDQ  D:  09/12/2006  T:  09/12/2006  Job:  [id number]

## 2010-06-21 NOTE — Assessment & Plan Note (Signed)
OFFICE VISIT   Raymond Patrick, Raymond Patrick  DOB:  09/06/1935                                        October 30, 2007  CHART #:  [id number]   The patient came today now 1 year after he did his surgery.  His CT scan  showed no evidence of recurrence.  His blood pressure 104/58, pulse 81,  respirations 18, and sats were 95% .  I will see Dr. Arbutus Ped again in 6  months and with the CT scan and we will let Dr. Arbutus Ped follow him.  We  will see him again, if he has any future problems.   Ines Bloomer, M.D.  Electronically Signed   DPB/MEDQ  D:  10/30/2007  T:  10/30/2007  Job:  [id number]

## 2010-06-21 NOTE — Assessment & Plan Note (Signed)
OFFICE VISIT   Raymond Patrick, Raymond Patrick  DOB:  19-Jun-1935                                        April 24, 2007  CHART #:  [id number]   Patient came in today.  His CT scan shows no evidence of recurrence of  his cancer.   His blood pressure is 123/76, pulse 90, respirations 18, sats 96%.   He will be seeing Dr. Arbutus Ped in September with another CT scan.  We  will see him at that time.  He is doing well now, approximately eight  months since he had a wedge resection for lung cancer.   Ines Bloomer, M.D.  Electronically Signed   DPB/MEDQ  D:  04/24/2007  T:  04/24/2007  Job:  [id number]

## 2010-07-16 ENCOUNTER — Inpatient Hospital Stay (INDEPENDENT_AMBULATORY_CARE_PROVIDER_SITE_OTHER)
Admission: RE | Admit: 2010-07-16 | Discharge: 2010-07-16 | Disposition: A | Discharge status: Home / Self Care | Payer: Medicare Other | Source: Ambulatory Visit | Attending: Emergency Medicine | Admitting: Emergency Medicine

## 2010-07-16 DIAGNOSIS — M799 Soft tissue disorder, unspecified: Secondary | ICD-10-CM

## 2010-07-16 DIAGNOSIS — Z23 Encounter for immunization: Secondary | ICD-10-CM

## 2010-07-16 DIAGNOSIS — R229 Localized swelling, mass and lump, unspecified: Secondary | ICD-10-CM

## 2010-07-16 DIAGNOSIS — IMO0002 Reserved for concepts with insufficient information to code with codable children: Secondary | ICD-10-CM

## 2010-07-16 DIAGNOSIS — T148XXA Other injury of unspecified body region, initial encounter: Secondary | ICD-10-CM

## 2010-07-21 ENCOUNTER — Inpatient Hospital Stay (HOSPITAL_COMMUNITY)
Admission: RE | Admit: 2010-07-21 | Discharge: 2010-07-21 | Disposition: A | Discharge status: Home / Self Care | Payer: Medicare Other | Source: Ambulatory Visit | Attending: Family Medicine | Admitting: Family Medicine

## 2010-10-05 ENCOUNTER — Encounter (HOSPITAL_BASED_OUTPATIENT_CLINIC_OR_DEPARTMENT_OTHER): Payer: Medicare Other | Admitting: Internal Medicine

## 2010-10-05 ENCOUNTER — Ambulatory Visit (HOSPITAL_COMMUNITY)
Admission: RE | Admit: 2010-10-05 | Discharge: 2010-10-05 | Disposition: A | Discharge status: Home / Self Care | Payer: Medicare Other | Source: Ambulatory Visit | Attending: Internal Medicine | Admitting: Internal Medicine

## 2010-10-05 ENCOUNTER — Other Ambulatory Visit: Payer: Self-pay | Admitting: Internal Medicine

## 2010-10-05 ENCOUNTER — Encounter (HOSPITAL_COMMUNITY): Payer: Self-pay

## 2010-10-05 DIAGNOSIS — C3410 Malignant neoplasm of upper lobe, unspecified bronchus or lung: Secondary | ICD-10-CM

## 2010-10-05 DIAGNOSIS — C3490 Malignant neoplasm of unspecified part of unspecified bronchus or lung: Secondary | ICD-10-CM | POA: Insufficient documentation

## 2010-10-05 DIAGNOSIS — I2510 Atherosclerotic heart disease of native coronary artery without angina pectoris: Secondary | ICD-10-CM | POA: Insufficient documentation

## 2010-10-05 HISTORY — DX: Malignant (primary) neoplasm, unspecified: C80.1

## 2010-10-05 LAB — CMP (CANCER CENTER ONLY)
BUN, Bld: 11 mg/dL (ref 7–22)
CO2: 29 mEq/L (ref 18–33)
Calcium: 9.2 mg/dL (ref 8.0–10.3)
Chloride: 102 mEq/L (ref 98–108)
Creat: 0.9 mg/dl (ref 0.6–1.2)
Glucose, Bld: 100 mg/dL (ref 73–118)

## 2010-10-05 LAB — CBC WITH DIFFERENTIAL/PLATELET
Basophils Absolute: 0 10*3/uL (ref 0.0–0.1)
EOS%: 1.7 % (ref 0.0–7.0)
Eosinophils Absolute: 0.1 10*3/uL (ref 0.0–0.5)
HCT: 39.9 % (ref 38.4–49.9)
HGB: 13.7 g/dL (ref 13.0–17.1)
MCH: 32 pg (ref 27.2–33.4)
MONO#: 0.4 10*3/uL (ref 0.1–0.9)
NEUT%: 47.4 % (ref 39.0–75.0)
lymph#: 2.9 10*3/uL (ref 0.9–3.3)

## 2010-10-05 MED ORDER — IOHEXOL 300 MG/ML  SOLN
80.0000 mL | Freq: Once | INTRAMUSCULAR | Status: AC | PRN
  Administered 2010-10-05: 80 mL via INTRAVENOUS

## 2010-10-12 ENCOUNTER — Other Ambulatory Visit: Payer: Self-pay | Admitting: Internal Medicine

## 2010-10-12 ENCOUNTER — Encounter: Payer: Medicare Other | Admitting: Internal Medicine

## 2010-10-12 DIAGNOSIS — C3490 Malignant neoplasm of unspecified part of unspecified bronchus or lung: Secondary | ICD-10-CM

## 2010-11-07 ENCOUNTER — Ambulatory Visit (HOSPITAL_COMMUNITY)
Admission: RE | Admit: 2010-11-07 | Discharge: 2010-11-07 | Disposition: A | Discharge status: Home / Self Care | Payer: Medicare Other | Source: Ambulatory Visit | Attending: Urology | Admitting: Urology

## 2010-11-07 DIAGNOSIS — C3490 Malignant neoplasm of unspecified part of unspecified bronchus or lung: Secondary | ICD-10-CM | POA: Insufficient documentation

## 2010-11-07 DIAGNOSIS — N400 Enlarged prostate without lower urinary tract symptoms: Secondary | ICD-10-CM | POA: Insufficient documentation

## 2010-11-07 DIAGNOSIS — Z01812 Encounter for preprocedural laboratory examination: Secondary | ICD-10-CM | POA: Insufficient documentation

## 2010-11-07 DIAGNOSIS — I4949 Other premature depolarization: Secondary | ICD-10-CM | POA: Insufficient documentation

## 2010-11-07 DIAGNOSIS — N200 Calculus of kidney: Secondary | ICD-10-CM | POA: Insufficient documentation

## 2010-11-18 LAB — BLOOD GAS, ARTERIAL
Acid-base deficit: 1.4
O2 Content: 2
O2 Saturation: 99.4
Patient temperature: 98.6
pO2, Arterial: 141 — ABNORMAL HIGH

## 2010-11-18 LAB — BASIC METABOLIC PANEL
BUN: 11
BUN: 5 — ABNORMAL LOW
BUN: 9
CO2: 27
CO2: 28
CO2: 28
Calcium: 8 — ABNORMAL LOW
Calcium: 9.1
Calcium: 9.2
Chloride: 106
Creatinine, Ser: 0.65
Creatinine, Ser: 0.73
Creatinine, Ser: 1.32
Creatinine, Ser: 1.4
GFR calc Af Amer: >60
GFR calc Af Amer: >60
GFR calc Af Amer: >60
GFR calc non Af Amer: 50 — ABNORMAL LOW
GFR calc non Af Amer: >60
GFR calc non Af Amer: >60
Glucose, Bld: 116 — ABNORMAL HIGH
Glucose, Bld: 97
Potassium: 3.6
Potassium: 3.8
Sodium: 140
Sodium: 142

## 2010-11-18 LAB — COMPREHENSIVE METABOLIC PANEL
ALT: 19
Calcium: 8.3 — ABNORMAL LOW
GFR calc Af Amer: >60
Glucose, Bld: 146 — ABNORMAL HIGH
Sodium: 136
Total Protein: 5.7 — ABNORMAL LOW

## 2010-11-18 LAB — CBC
HCT: 28.7 — ABNORMAL LOW
HCT: 31.7 — ABNORMAL LOW
Hemoglobin: 11.1 — ABNORMAL LOW
Hemoglobin: 9.9 — ABNORMAL LOW
MCHC: 34
MCHC: 34.5
MCHC: 34.6
MCHC: 34.6
MCV: 93.1
MCV: 94.5
RBC: 3.08 — ABNORMAL LOW
RBC: 3.38 — ABNORMAL LOW
RDW: 13.5
RDW: 13.7
RDW: 13.9
WBC: 9.2

## 2010-11-18 LAB — URINE MICROSCOPIC-ADD ON

## 2010-11-18 LAB — URINALYSIS, ROUTINE W REFLEX MICROSCOPIC
Bilirubin Urine: NEGATIVE
Nitrite: NEGATIVE
Protein, ur: NEGATIVE
Specific Gravity, Urine: 1.006
Urobilinogen, UA: 0.2

## 2010-11-21 LAB — TYPE AND SCREEN
ABO/RH(D): O POS
Antibody Screen: NEGATIVE

## 2010-11-21 LAB — CBC
HCT: 38.4 — ABNORMAL LOW
Hemoglobin: 13.2
MCHC: 34.4
MCV: 93.6
RBC: 4.1 — ABNORMAL LOW
WBC: 6.6

## 2010-11-21 LAB — BLOOD GAS, ARTERIAL
Acid-base deficit: 1.1
Drawn by: 206361
O2 Saturation: 95.1
TCO2: 23.4
pCO2 arterial: 33.2 — ABNORMAL LOW
pO2, Arterial: 72 — ABNORMAL LOW

## 2010-11-21 LAB — COMPREHENSIVE METABOLIC PANEL
AST: 27
BUN: 9
CO2: 22
Calcium: 8.9
Chloride: 109
Creatinine, Ser: 0.75
GFR calc Af Amer: >60
GFR calc non Af Amer: >60
Glucose, Bld: 98
Total Bilirubin: 0.9

## 2010-11-21 LAB — URINALYSIS, ROUTINE W REFLEX MICROSCOPIC
Bilirubin Urine: NEGATIVE
Ketones, ur: NEGATIVE
Nitrite: NEGATIVE
Protein, ur: NEGATIVE
pH: 7.5

## 2010-11-21 LAB — PROTIME-INR: Prothrombin Time: 13.1

## 2010-11-21 LAB — APTT: aPTT: 20 — ABNORMAL LOW

## 2011-02-16 DIAGNOSIS — Z79899 Other long term (current) drug therapy: Secondary | ICD-10-CM | POA: Diagnosis not present

## 2011-02-16 DIAGNOSIS — E782 Mixed hyperlipidemia: Secondary | ICD-10-CM | POA: Diagnosis not present

## 2011-02-16 DIAGNOSIS — E559 Vitamin D deficiency, unspecified: Secondary | ICD-10-CM | POA: Diagnosis not present

## 2011-02-16 DIAGNOSIS — I10 Essential (primary) hypertension: Secondary | ICD-10-CM | POA: Diagnosis not present

## 2011-05-18 DIAGNOSIS — E782 Mixed hyperlipidemia: Secondary | ICD-10-CM | POA: Diagnosis not present

## 2011-05-18 DIAGNOSIS — N3000 Acute cystitis without hematuria: Secondary | ICD-10-CM | POA: Diagnosis not present

## 2011-05-18 DIAGNOSIS — I10 Essential (primary) hypertension: Secondary | ICD-10-CM | POA: Diagnosis not present

## 2011-05-18 DIAGNOSIS — E559 Vitamin D deficiency, unspecified: Secondary | ICD-10-CM | POA: Diagnosis not present

## 2011-05-18 DIAGNOSIS — Z79899 Other long term (current) drug therapy: Secondary | ICD-10-CM | POA: Diagnosis not present

## 2011-06-13 DIAGNOSIS — R3915 Urgency of urination: Secondary | ICD-10-CM | POA: Diagnosis not present

## 2011-06-13 DIAGNOSIS — R319 Hematuria, unspecified: Secondary | ICD-10-CM | POA: Diagnosis not present

## 2011-06-13 DIAGNOSIS — R300 Dysuria: Secondary | ICD-10-CM | POA: Diagnosis not present

## 2011-06-13 DIAGNOSIS — N410 Acute prostatitis: Secondary | ICD-10-CM | POA: Diagnosis not present

## 2011-07-05 DIAGNOSIS — N419 Inflammatory disease of prostate, unspecified: Secondary | ICD-10-CM | POA: Diagnosis not present

## 2011-07-10 DIAGNOSIS — N3000 Acute cystitis without hematuria: Secondary | ICD-10-CM | POA: Diagnosis not present

## 2011-07-10 DIAGNOSIS — N200 Calculus of kidney: Secondary | ICD-10-CM | POA: Diagnosis not present

## 2011-07-10 DIAGNOSIS — R3129 Other microscopic hematuria: Secondary | ICD-10-CM | POA: Diagnosis not present

## 2011-07-10 DIAGNOSIS — N401 Enlarged prostate with lower urinary tract symptoms: Secondary | ICD-10-CM | POA: Diagnosis not present

## 2011-08-31 ENCOUNTER — Emergency Department (HOSPITAL_COMMUNITY)
Admission: EM | Admit: 2011-08-31 | Discharge: 2011-08-31 | Disposition: A | Discharge status: Home / Self Care | Payer: Medicare Other | Attending: Emergency Medicine | Admitting: Emergency Medicine

## 2011-08-31 ENCOUNTER — Encounter (HOSPITAL_COMMUNITY): Payer: Self-pay | Admitting: *Deleted

## 2011-08-31 ENCOUNTER — Emergency Department (HOSPITAL_COMMUNITY): Payer: Medicare Other

## 2011-08-31 DIAGNOSIS — R109 Unspecified abdominal pain: Secondary | ICD-10-CM | POA: Diagnosis not present

## 2011-08-31 DIAGNOSIS — Z85118 Personal history of other malignant neoplasm of bronchus and lung: Secondary | ICD-10-CM | POA: Diagnosis not present

## 2011-08-31 DIAGNOSIS — Z87891 Personal history of nicotine dependence: Secondary | ICD-10-CM | POA: Diagnosis not present

## 2011-08-31 DIAGNOSIS — N200 Calculus of kidney: Secondary | ICD-10-CM | POA: Diagnosis not present

## 2011-08-31 DIAGNOSIS — R339 Retention of urine, unspecified: Secondary | ICD-10-CM | POA: Insufficient documentation

## 2011-08-31 DIAGNOSIS — N201 Calculus of ureter: Secondary | ICD-10-CM | POA: Diagnosis not present

## 2011-08-31 DIAGNOSIS — N1330 Unspecified hydronephrosis: Secondary | ICD-10-CM | POA: Diagnosis not present

## 2011-08-31 DIAGNOSIS — R1032 Left lower quadrant pain: Secondary | ICD-10-CM | POA: Diagnosis not present

## 2011-08-31 DIAGNOSIS — R310 Gross hematuria: Secondary | ICD-10-CM | POA: Diagnosis not present

## 2011-08-31 LAB — CBC WITH DIFFERENTIAL/PLATELET
Hemoglobin: 14 g/dL (ref 13.0–17.0)
Lymphocytes Relative: 8 % — ABNORMAL LOW (ref 12–46)
Lymphs Abs: 1.3 10*3/uL (ref 0.7–4.0)
Monocytes Relative: 4 % (ref 3–12)
Neutro Abs: 13.3 10*3/uL — ABNORMAL HIGH (ref 1.7–7.7)
Neutrophils Relative %: 88 % — ABNORMAL HIGH (ref 43–77)
RBC: 4.44 MIL/uL (ref 4.22–5.81)
WBC: 15.2 10*3/uL — ABNORMAL HIGH (ref 4.0–10.5)

## 2011-08-31 LAB — URINALYSIS, ROUTINE W REFLEX MICROSCOPIC
Bilirubin Urine: NEGATIVE
Leukocytes, UA: NEGATIVE
Nitrite: NEGATIVE
Specific Gravity, Urine: >1.03 — ABNORMAL HIGH (ref 1.005–1.030)
pH: 5.5 (ref 5.0–8.0)

## 2011-08-31 LAB — URINE MICROSCOPIC-ADD ON

## 2011-08-31 LAB — COMPREHENSIVE METABOLIC PANEL
ALT: 26 U/L (ref 0–53)
Alkaline Phosphatase: 81 U/L (ref 39–117)
BUN: 18 mg/dL (ref 6–23)
Chloride: 106 mEq/L (ref 96–112)
GFR calc Af Amer: 73 mL/min — ABNORMAL LOW (ref >90)
Glucose, Bld: 130 mg/dL — ABNORMAL HIGH (ref 70–99)
Potassium: 4.4 mEq/L (ref 3.5–5.1)
Sodium: 142 mEq/L (ref 135–145)
Total Bilirubin: 0.5 mg/dL (ref 0.3–1.2)

## 2011-08-31 MED ORDER — MORPHINE SULFATE 4 MG/ML IJ SOLN
4.0000 mg | Freq: Once | INTRAMUSCULAR | Status: AC
  Administered 2011-08-31: 4 mg via INTRAVENOUS
  Filled 2011-08-31: qty 1

## 2011-08-31 MED ORDER — OXYCODONE-ACETAMINOPHEN 5-325 MG PO TABS: ORAL_TABLET | ORAL | Status: AC

## 2011-08-31 MED ORDER — ONDANSETRON HCL 4 MG/2ML IJ SOLN
4.0000 mg | INTRAMUSCULAR | Status: AC
  Administered 2011-08-31: 4 mg via INTRAVENOUS
  Filled 2011-08-31: qty 2

## 2011-08-31 MED ORDER — ONDANSETRON HCL 4 MG PO TABS: 4.0000 mg | ORAL_TABLET | Freq: Three times a day (TID) | ORAL | Status: AC | PRN

## 2011-08-31 MED ORDER — SODIUM CHLORIDE 0.9 % IV BOLUS (SEPSIS)
500.0000 mL | Freq: Once | INTRAVENOUS | Status: AC
  Administered 2011-08-31: 500 mL via INTRAVENOUS

## 2011-08-31 NOTE — ED Provider Notes (Signed)
Medical screening examination/treatment/procedure(s) were conducted as a shared visit with non-physician practitioner(s) and myself.  I personally evaluated the patient during the encounter  Left flank pain with difficulty with urination since this morning. Abdomen soft and nontender Obstructive UPJ stone seen on CT scan. Discussed with urology per PA-C.  Glynn Octave, MD 08/31/11 [phone]

## 2011-08-31 NOTE — ED Notes (Signed)
Leg bag placed on Foley cath. Along with leg strap.  Clear yellow urine returned, pt tolerated  well

## 2011-08-31 NOTE — ED Notes (Signed)
Patient stated he has been feeling like he has to urinate.  Had a few catheters left over from when he had to cath himself and tried to cath himself this morning and only had a small return.  Feels like he has to urinate and if he could he might feel better.

## 2011-08-31 NOTE — ED Notes (Signed)
Pt awoke at to am with LLQ and L flank pain and urinary retention.  PT states he did cath himself to attempt to relieve urinary retention with some urine/blood (Pt had catheter left-over from problem with urinary retention post lung-surgery in 2008). He states he finished cipro for bladder infection 1 week ago.  Hx of lithotripsy 11/2010.

## 2011-08-31 NOTE — ED Provider Notes (Signed)
History     CSN: [id number]  Arrival date & time 08/31/11  0610   None     Chief Complaint  Patient presents with  . Flank Pain  . Urinary Retention    (Consider location/radiation/quality/duration/timing/severity/associated sxs/prior treatment) The history is provided by the patient.    76 year old diabetic male (Lung Ca ion remission 5 years)  in no acute distress complaining of urinary retention and left flank pain acute onset this a.m. Patient denies fever, nausea and vomiting. Left flank plain pain is rated at 8/10 and radiates to the lower left quadrant. Patient performed self-catheterization this a.m. Which produced small volume of dark urine. Patient was treated with 7 days of Cipro for UTI, course finished 7 days ago. Managed by Dahlstedt at [facility] urology, Pt had lithotripsy in 2012.    Past Medical History  Diagnosis Date  . Cancer     lung ca  . Renal disorder     renal calculi    Past Surgical History  Procedure Date  . Lung removal, partial 2008  . Lithotripsy 2012    No family history on file.  History  Substance Use Topics  . Smoking status: Former Games developer  . Smokeless tobacco: Not on file  . Alcohol Use: Yes     socially      Review of Systems  Constitutional: Negative for fever.  Eyes: Negative for visual disturbance.  Respiratory: Negative for cough and shortness of breath.   Cardiovascular: Negative for chest pain.  Gastrointestinal: Negative for nausea and vomiting.  Genitourinary: Positive for flank pain and decreased urine volume. Negative for hematuria.  Neurological: Negative for weakness.  All other systems reviewed and are negative.    Allergies  Review of patient's allergies indicates no known allergies.  Home Medications  No current outpatient prescriptions on file.  BP 137/59  Pulse 80  Temp 97.5 F (36.4 C) (Oral)  Resp 20  SpO2 94%  Physical Exam  Vitals reviewed. Constitutional: He is oriented to person,  place, and time. He appears well-developed and well-nourished. No distress.  HENT:  Head: Normocephalic.  Eyes: Conjunctivae and EOM are normal.  Cardiovascular: Normal rate.   Pulmonary/Chest: Effort normal.  Abdominal: Soft. Bowel sounds are normal. He exhibits no distension and no mass. There is tenderness. There is no rebound and no guarding.       Mild tenderness to left lower quadrant. Mild left CVA tenderness  Musculoskeletal: Normal range of motion.  Neurological: He is alert and oriented to person, place, and time.  Psychiatric: He has a normal mood and affect.    ED Course  Procedures (including critical care time)  Labs Reviewed  URINALYSIS, ROUTINE W REFLEX MICROSCOPIC - Abnormal; Notable for the following:    Color, Urine BROWN (*)  BIOCHEMICALS MAY BE AFFECTED BY COLOR   APPearance CLOUDY (*)     Specific Gravity, Urine >1.030 (*)     Hgb urine dipstick LARGE (*)     Protein, ur 100 (*)     All other components within normal limits  CBC WITH DIFFERENTIAL - Abnormal; Notable for the following:    WBC 15.2 (*)     Neutrophils Relative 88 (*)     Neutro Abs 13.3 (*)     Lymphocytes Relative 8 (*)     All other components within normal limits  COMPREHENSIVE METABOLIC PANEL - Abnormal; Notable for the following:    Glucose, Bld 130 (*)     GFR calc non Af Denyse Dago  63 (*)     GFR calc Af Amer 73 (*)     All other components within normal limits  URINE MICROSCOPIC-ADD ON - Abnormal; Notable for the following:    Squamous Epithelial / LPF FEW (*)     Bacteria, UA FEW (*)     Casts HYALINE CASTS (*)     Crystals CA OXALATE CRYSTALS (*)     All other components within normal limits  URINE CULTURE   Ct Abdomen Pelvis Wo Contrast  08/31/2011  *RADIOLOGY REPORT*  Clinical Data: Left flank pain.  Left lower quadrant pain. Dysuria.  History of renal stones.  CT ABDOMEN AND PELVIS WITHOUT CONTRAST  Technique:  Multidetector CT imaging of the abdomen and pelvis was performed  following the standard protocol without intravenous contrast.  Comparison: Radiographs 07/10/2011.  Findings: Lung Bases: Mild basilar atelectasis and scarring.  Liver:  Grossly normal.  Spleen:  Normal.  Gallbladder:  Biliary sludge layering dependently.  Common bile duct:  No calcified stones.  Pancreas:  Normal.  Adrenal glands:  Normal.  Kidneys:  Moderate left hydronephrosis is present extending to the left UPJ.  There is a 5 mm x 4 mm calculus lodged at the UPJ producing obstruction.  There is a larger left inferior pole renal collecting system calculus measuring 7 mm x 9 mm with smaller other punctate calculi in the left renal collecting system.  No right renal calculi are identified.  11 mm interpolar right renal cyst. Punctate right renal collecting system calculi are present.  Right ureter normal.  Stomach:  Grossly normal.  Small bowel:  Grossly normal.  Colon:   Prominent right-sided stool burden.  No inflammatory changes. Colonic diverticulosis.  Pelvic Genitourinary:  Decompressed urinary bladder. Even for decompressed urinary bladder, the bladder wall is markedly thickened, suggesting the possibility of chronic bladder outflow obstruction.  Inferior bladder wall measures up to 14 mm.  Prostate gland shows benign calcifications.  Left spermatic cord lipoma.  Bones:  No aggressive osseous lesions.  Likely chronic T11 and T12 compression fractures with 25% loss of vertebral body height.  Vasculature: Aortoiliac atherosclerosis.  Dilated left common iliac artery distally measuring 17 mm.  IMPRESSION:  1.  Moderate left hydroureteronephrosis with 4 mm x 5 mm obstructing left UPJ stone.  Multiple other nonobstructing left sided nonobstructing renal collecting system calculi are present with the largest stone measuring 7 mm x 9 mm in the inferior pole. 2.  Although the urinary bladder is decompressed, there appears the prominent thickening of the bladder wall, measuring up to 14 mm. Question chronic bladder  outflow obstruction or chronic cystitis. 3.  Atherosclerosis.  Original Report Authenticated By: Andreas Newport, M.D.     1. Urinary retention   2. Nephrolithiasis   3. Hydronephrosis       MDM  76 year old male complaining of urinary retention onset this morning. Patient has left CVA tenderness, with no abdominal tenderness. In and out catheter produced a very small amount of dark colored urine in the ED. UA shows red blood cells. White count of 15.2. Stone protocol CT shows mild left hydronephrosis with obstructing UPJ stone. Also bladder wall thickening to 14 mm. I will consult urology.   Patient drove to the ED but states he will walk home following administration of narcotic analgesic.  Consult from nephrology Dr. Retta Diones appreciated: He advised [place] to insert Foley catheter and DC patient with followup in his office as an outpatient tomorrow or Monday. Patient has history of urinary retention  and BPH. Discussed case with attending who agrees with plan and stability to d/c to home.  Pt verbalized understanding and agrees with care plan. Outpatient follow-up and return precautions given.     Shared Visit with Attending Dr. Manus Gunning.          Wynetta Emery, PA-C 08/31/11 [phone]

## 2011-09-01 LAB — URINE CULTURE: Culture: NO GROWTH

## 2011-09-05 DIAGNOSIS — N401 Enlarged prostate with lower urinary tract symptoms: Secondary | ICD-10-CM | POA: Diagnosis not present

## 2011-09-05 DIAGNOSIS — N201 Calculus of ureter: Secondary | ICD-10-CM | POA: Diagnosis not present

## 2011-09-05 DIAGNOSIS — R339 Retention of urine, unspecified: Secondary | ICD-10-CM | POA: Diagnosis not present

## 2011-09-05 DIAGNOSIS — N200 Calculus of kidney: Secondary | ICD-10-CM | POA: Diagnosis not present

## 2011-09-21 DIAGNOSIS — N3000 Acute cystitis without hematuria: Secondary | ICD-10-CM | POA: Diagnosis not present

## 2011-09-21 DIAGNOSIS — Z125 Encounter for screening for malignant neoplasm of prostate: Secondary | ICD-10-CM | POA: Diagnosis not present

## 2011-09-21 DIAGNOSIS — R7309 Other abnormal glucose: Secondary | ICD-10-CM | POA: Diagnosis not present

## 2011-09-21 DIAGNOSIS — I10 Essential (primary) hypertension: Secondary | ICD-10-CM | POA: Diagnosis not present

## 2011-09-21 DIAGNOSIS — Z79899 Other long term (current) drug therapy: Secondary | ICD-10-CM | POA: Diagnosis not present

## 2011-09-21 DIAGNOSIS — Z1212 Encounter for screening for malignant neoplasm of rectum: Secondary | ICD-10-CM | POA: Diagnosis not present

## 2011-09-21 DIAGNOSIS — E559 Vitamin D deficiency, unspecified: Secondary | ICD-10-CM | POA: Diagnosis not present

## 2011-09-21 DIAGNOSIS — E782 Mixed hyperlipidemia: Secondary | ICD-10-CM | POA: Diagnosis not present

## 2011-09-21 DIAGNOSIS — N529 Male erectile dysfunction, unspecified: Secondary | ICD-10-CM | POA: Diagnosis not present

## 2011-10-04 DIAGNOSIS — N200 Calculus of kidney: Secondary | ICD-10-CM | POA: Diagnosis not present

## 2011-10-04 DIAGNOSIS — N401 Enlarged prostate with lower urinary tract symptoms: Secondary | ICD-10-CM | POA: Diagnosis not present

## 2011-10-04 DIAGNOSIS — N201 Calculus of ureter: Secondary | ICD-10-CM | POA: Diagnosis not present

## 2011-10-04 DIAGNOSIS — C679 Malignant neoplasm of bladder, unspecified: Secondary | ICD-10-CM | POA: Diagnosis not present

## 2011-10-05 ENCOUNTER — Telehealth: Payer: Self-pay | Admitting: Internal Medicine

## 2011-10-05 ENCOUNTER — Other Ambulatory Visit: Payer: Self-pay | Admitting: Urology

## 2011-10-05 NOTE — Telephone Encounter (Signed)
lmonvm for pt re appts for 9/9 and 9/16 including ct appt. Schedule mailed.

## 2011-10-10 ENCOUNTER — Encounter (HOSPITAL_COMMUNITY): Payer: Self-pay | Admitting: Pharmacy Technician

## 2011-10-16 ENCOUNTER — Other Ambulatory Visit (HOSPITAL_BASED_OUTPATIENT_CLINIC_OR_DEPARTMENT_OTHER): Payer: Medicare Other

## 2011-10-16 ENCOUNTER — Ambulatory Visit (HOSPITAL_COMMUNITY)
Admission: RE | Admit: 2011-10-16 | Discharge: 2011-10-16 | Disposition: A | Discharge status: Home / Self Care | Payer: Medicare Other | Source: Ambulatory Visit | Attending: Internal Medicine | Admitting: Internal Medicine

## 2011-10-16 DIAGNOSIS — I2510 Atherosclerotic heart disease of native coronary artery without angina pectoris: Secondary | ICD-10-CM | POA: Diagnosis not present

## 2011-10-16 DIAGNOSIS — C3410 Malignant neoplasm of upper lobe, unspecified bronchus or lung: Secondary | ICD-10-CM

## 2011-10-16 DIAGNOSIS — C3490 Malignant neoplasm of unspecified part of unspecified bronchus or lung: Secondary | ICD-10-CM | POA: Diagnosis not present

## 2011-10-16 DIAGNOSIS — M948X9 Other specified disorders of cartilage, unspecified sites: Secondary | ICD-10-CM | POA: Diagnosis not present

## 2011-10-16 LAB — COMPREHENSIVE METABOLIC PANEL (CC13)
AST: 20 U/L (ref 5–34)
Albumin: 3.5 g/dL (ref 3.5–5.0)
BUN: 12 mg/dL (ref 7.0–26.0)
Calcium: 9.5 mg/dL (ref 8.4–10.4)
Chloride: 108 mEq/L — ABNORMAL HIGH (ref 98–107)
Creatinine: 0.9 mg/dL (ref 0.7–1.3)
Glucose: 100 mg/dl — ABNORMAL HIGH (ref 70–99)
Potassium: 3.9 mEq/L (ref 3.5–5.1)

## 2011-10-16 LAB — CBC WITH DIFFERENTIAL/PLATELET
Basophils Absolute: 0.1 10*3/uL (ref 0.0–0.1)
Eosinophils Absolute: 0.7 10*3/uL — ABNORMAL HIGH (ref 0.0–0.5)
HCT: 39.2 % (ref 38.4–49.9)
HGB: 13.3 g/dL (ref 13.0–17.1)
MCH: 31.5 pg (ref 27.2–33.4)
MONO#: 0.5 10*3/uL (ref 0.1–0.9)
NEUT#: 3.6 10*3/uL (ref 1.5–6.5)
NEUT%: 49.5 % (ref 39.0–75.0)
RDW: 13 % (ref 11.0–14.6)
lymph#: 2.3 10*3/uL (ref 0.9–3.3)

## 2011-10-16 MED ORDER — IOHEXOL 300 MG/ML  SOLN
80.0000 mL | Freq: Once | INTRAMUSCULAR | Status: AC | PRN
  Administered 2011-10-16: 80 mL via INTRAVENOUS

## 2011-10-23 ENCOUNTER — Telehealth: Payer: Self-pay | Admitting: Internal Medicine

## 2011-10-23 ENCOUNTER — Ambulatory Visit (HOSPITAL_BASED_OUTPATIENT_CLINIC_OR_DEPARTMENT_OTHER): Payer: Medicare Other | Admitting: Internal Medicine

## 2011-10-23 VITALS — BP 123/67 | HR 81 | Temp 96.9°F | Resp 20 | Wt 175.0 lb

## 2011-10-23 DIAGNOSIS — C3410 Malignant neoplasm of upper lobe, unspecified bronchus or lung: Secondary | ICD-10-CM

## 2011-10-23 DIAGNOSIS — C3490 Malignant neoplasm of unspecified part of unspecified bronchus or lung: Secondary | ICD-10-CM

## 2011-10-23 NOTE — Progress Notes (Signed)
[facility] [phone]   Fax:([phone]  OFFICE PROGRESS NOTE  [name], MD [address] [place] [address]  DIAGNOSIS: Stage IA (T1a., N0, MX) non-small cell lung cancer, adenocarcinoma diagnosed in July 2008.  PRIOR THERAPY: Status post wedge resection of the right upper lobe with lymph node biopsy under the care of Dr. Edwyna Shell on 09/21/2006  CURRENT THERAPY: Observation.  INTERVAL HISTORY: Raymond Patrick 76 y.o. male returns to the clinic today for annual followup visit. The patient is feeling fine today with no specific complaints. He scheduled for lithotripsy in the next few days for a kidney stone. He denied having any significant weight loss or night sweats. He denied having any chest pain, shortness breath, cough or hemoptysis. The patient has repeat CT scan of the chest performed recently and he is here for evaluation and discussion of his scan results.  MEDICAL HISTORY: Past Medical History  Diagnosis Date  . Cancer     lung ca  . Renal disorder     renal calculi    ALLERGIES:  is allergic to nitrofuran derivatives.  MEDICATIONS:  Current Outpatient Prescriptions  Medication Sig Dispense Refill  . Cholecalciferol (VITAMIN D) 2000 UNITS CAPS Take 4,000 Units by mouth daily.      . finasteride (PROSCAR) 5 MG tablet Take 5 mg by mouth at bedtime.      . Multiple Vitamin (MULTIVITAMIN WITH MINERALS) TABS Take 1 tablet by mouth daily.      . pravastatin (PRAVACHOL) 40 MG tablet Take 40 mg by mouth at bedtime.      Marland Kitchen terazosin (HYTRIN) 10 MG capsule Take 10 mg by mouth at bedtime.        SURGICAL HISTORY:  Past Surgical History  Procedure Date  . Lung removal, partial 2008  . Lithotripsy 2012    REVIEW OF SYSTEMS:  A comprehensive review of systems was negative.   PHYSICAL EXAMINATION: General appearance: alert, cooperative and no distress Neck: no adenopathy Lymph nodes: Cervical,  supraclavicular, and axillary nodes normal. Resp: clear to auscultation bilaterally Cardio: regular rate and rhythm, S1, S2 normal, no murmur, click, rub or gallop GI: soft, non-tender; bowel sounds normal; no masses,  no organomegaly Extremities: extremities normal, atraumatic, no cyanosis or edema  ECOG PERFORMANCE STATUS: 0 - Asymptomatic  Blood pressure 123/67, pulse 81, temperature 96.9 F (36.1 C), resp. rate 20, weight 175 lb (79.379 kg).  LABORATORY DATA: Lab Results  Component Value Date   WBC 7.2 10/16/2011   HGB 13.3 10/16/2011   HCT 39.2 10/16/2011   MCV 92.7 10/16/2011   PLT 182 10/16/2011      Chemistry      Component Value Date/Time   NA 141 10/16/2011 0828   NA 142 08/31/2011 0708   NA 141 10/05/2010 0918   K 3.9 10/16/2011 0828   K 4.4 08/31/2011 0708   K 4.3 10/05/2010 0918   CL 108* 10/16/2011 0828   CL 106 08/31/2011 0708   CL 102 10/05/2010 0918   CO2 23 10/16/2011 0828   CO2 26 08/31/2011 0708   CO2 29 10/05/2010 0918   BUN 12.0 10/16/2011 0828   BUN 18 08/31/2011 0708   BUN 11 10/05/2010 0918   CREATININE 0.9 10/16/2011 0828   CREATININE 1.12 08/31/2011 0708   CREATININE 0.9 10/05/2010 0918      Component Value Date/Time   CALCIUM 9.5 10/16/2011 0828   CALCIUM 9.8 08/31/2011 0708   CALCIUM 9.2 10/05/2010  0918   ALKPHOS 88 10/16/2011 0828   ALKPHOS 81 08/31/2011 0708   ALKPHOS 83 10/05/2010 0918   AST 20 10/16/2011 0828   AST 27 08/31/2011 0708   AST 31 10/05/2010 0918   ALT 17 10/16/2011 0828   ALT 26 08/31/2011 0708   BILITOT 0.70 10/16/2011 0828   BILITOT 0.5 08/31/2011 0708   BILITOT 0.80 10/05/2010 0918       RADIOGRAPHIC STUDIES: Ct Chest W Contrast  10/16/2011  *RADIOLOGY REPORT*  Clinical Data: Lung cancer.  CT CHEST WITH CONTRAST  Technique:  Multidetector CT imaging of the chest was performed following the standard protocol during bolus administration of intravenous contrast.  Contrast: 80mL OMNIPAQUE IOHEXOL 300 MG/ML  SOLN  Comparison: CT abdomen pelvis 08/31/2011 and CT  chest 10/05/2010.  Findings: No pathologically enlarged mediastinal, hilar or axillary lymph nodes.  Atherosclerotic calcification of the arterial vasculature, including coronary arteries.  Heart size normal.  No pericardial effusion.  Emphysema.  Postoperative changes are seen in the right upper lobe. A tiny faint nodular density along the minor fissure is unchanged and likely a subpleural lymph node.  Scarring in the left lower lobe.  Subpleural atelectasis in both lower lobes.  No pleural fluid. Debris is seen dependently in the lower trachea.  Incidental imaging of the upper abdomen shows residual dilatation of the upper pole collecting system on the left.  Left kidney is incompletely imaged.  A focal sclerotic lesion in the lateral aspect of the left seventh rib is unchanged and likely a bone island.  No worrisome lytic or sclerotic lesions.  Degenerative changes are seen in the spine. Mild compression deformities in the lower thoracic and upper lumbar spine are unchanged.  IMPRESSION:  1.  Postoperative changes and volume loss in the right upper lobe without evidence of recurrent or metastatic disease. 2.  Coronary artery calcification. 3.  Residual dilatation of the upper pole collecting system in the left kidney, incompletely imaged.   Original Report Authenticated By: Reyes Ivan, M.D.     ASSESSMENT: This is a very pleasant 75 years old white male with history of stage IA non-small cell lung cancer diagnosed more than 5 years ago status post wedge resection of the right upper lobe. The patient has been observation for the last 5 years with no evidence for disease recurrence.  PLAN: I discussed the scan results with the patient and recommended for him to continue on observation for now. I gave him the option of followup visit with his primary care physician and repeat imaging annually versus continuing with annual followup visit at the cancer Center with repeat CT scan of the chest. The patient  would like to continue his surveillance at the cancer Center. He would come back for followup visit in one year with repeat CT scan of the chest without contrast. He was advised to call me immediately if he has any concerning symptoms in the interval.  All questions were answered. The patient knows to call the clinic with any problems, questions or concerns. We can certainly see the patient much sooner if necessary.

## 2011-10-23 NOTE — Patient Instructions (Signed)
Your CT scan of the chest showed no evidence for disease recurrence. Followup in one year with repeat CT scan. Please call if you have any questions in the interval

## 2011-10-23 NOTE — Telephone Encounter (Signed)
Gave pt appt for 1 year for lab and CT then MD visit, September 2014

## 2011-10-25 ENCOUNTER — Encounter (HOSPITAL_COMMUNITY): Payer: Self-pay | Admitting: *Deleted

## 2011-10-25 NOTE — Pre-Procedure Instructions (Signed)
Asked to bring blue folder the day of the procedure,insurance card,I.D. driver's license,wear comfortable clothing and have a driver for the day. Asked not to take Advil,Motrin,Ibuprofen,Aleve or any NSAIDS, Aspirin, or Toradol for 72 hours prior to procedure,  No vitamins or herbal medications 7 days prior to procedure. Instructed to take laxative per doctor's office instructions and eat a light dinner the evening before procedure.   To arrive at 0645 for lithotripsy procedure.  

## 2011-10-26 DIAGNOSIS — N419 Inflammatory disease of prostate, unspecified: Secondary | ICD-10-CM | POA: Diagnosis not present

## 2011-10-30 ENCOUNTER — Ambulatory Visit (HOSPITAL_COMMUNITY)
Admission: RE | Admit: 2011-10-30 | Discharge: 2011-10-30 | Disposition: A | Discharge status: Home / Self Care | Payer: Medicare Other | Source: Ambulatory Visit | Attending: Urology | Admitting: Urology

## 2011-10-30 ENCOUNTER — Encounter (HOSPITAL_COMMUNITY)
Admission: RE | Disposition: A | Discharge status: Home / Self Care | Payer: Self-pay | Source: Ambulatory Visit | Attending: Urology

## 2011-10-30 ENCOUNTER — Other Ambulatory Visit: Payer: Self-pay | Admitting: Urology

## 2011-10-30 ENCOUNTER — Encounter (HOSPITAL_COMMUNITY): Payer: Self-pay

## 2011-10-30 ENCOUNTER — Ambulatory Visit (HOSPITAL_COMMUNITY): Payer: Medicare Other

## 2011-10-30 DIAGNOSIS — N201 Calculus of ureter: Secondary | ICD-10-CM | POA: Insufficient documentation

## 2011-10-30 SURGERY — LITHOTRIPSY, ESWL
Anesthesia: LOCAL | Laterality: Left

## 2011-10-30 MED ORDER — DIPHENHYDRAMINE HCL 25 MG PO CAPS: 25.0000 mg | ORAL_CAPSULE | ORAL | Status: DC

## 2011-10-30 MED ORDER — DIAZEPAM 5 MG PO TABS: 10.0000 mg | ORAL_TABLET | ORAL | Status: DC

## 2011-10-30 MED ORDER — CIPROFLOXACIN IN D5W 400 MG/200ML IV SOLN: 400.0000 mg | INTRAVENOUS | Status: DC

## 2011-10-30 MED ORDER — DEXTROSE-NACL 5-0.45 % IV SOLN: INTRAVENOUS | Status: DC

## 2011-10-30 NOTE — Progress Notes (Signed)
Litho truck broken, delay in procedure

## 2011-10-30 NOTE — Progress Notes (Signed)
procedure canceled due to broken truck

## 2011-10-30 NOTE — H&P (Signed)
  Urology History and Physical Exam  CC: Left kidney stone  HPI: 76 year old male presents for lithotripsy for a persistent 6 mm left sided ureteral calculus. This was first diagnosed in July 2013, and at last visit in late August, it was still present in his left upper ureter. He has also been found to have a bladder tumor and will have a TUR-BT as well as a TURP in the near future.  PMH: Past Medical History  Diagnosis Date  . Renal disorder     renal calculi  . Cancer     lung ca    PSH: Past Surgical History  Procedure Date  . Lung removal, partial 2008  . Lithotripsy 2012    Allergies: Allergies  Allergen Reactions  . Nitrofuran Derivatives Shortness Of Breath    Medications: Prescriptions prior to admission  Medication Sig Dispense Refill  . Cholecalciferol (VITAMIN D) 2000 UNITS CAPS Take 4,000 Units by mouth daily.      . finasteride (PROSCAR) 5 MG tablet Take 5 mg by mouth at bedtime.      . Multiple Vitamin (MULTIVITAMIN WITH MINERALS) TABS Take 1 tablet by mouth daily.      . pravastatin (PRAVACHOL) 40 MG tablet Take 40 mg by mouth at bedtime.      Marland Kitchen terazosin (HYTRIN) 10 MG capsule Take 10 mg by mouth at bedtime.         Social History: History   Social History  . Marital Status: Single    Spouse Name: N/A    Number of Children: N/A  . Years of Education: N/A   Occupational History  . Not on file.   Social History Main Topics  . Smoking status: Former Games developer  . Smokeless tobacco: Never Used  . Alcohol Use: Yes     socially  . Drug Use: No  . Sexually Active:    Other Topics Concern  . Not on file   Social History Narrative  . No narrative on file    Family History: History reviewed. No pertinent family history.  Review of Systems: Positive: Blood in urine, decreased FOS, BOO sx's. Negative:  A further 10 point review of systems was negative except what is listed in the HPI.  Physical Exam: @VITALS2 @ General: No acute distress.   Awake. Head:  Normocephalic.  Atraumatic. ENT:  EOMI.  Mucous membranes moist Neck:  Supple.  No lymphadenopathy. CV:  S1 present. S2 present. Regular rate. Pulmonary: Equal effort bilaterally.  Clear to auscultation bilaterally. Abdomen: Soft.  Nontender to palpation. Skin:  Normal turgor.  No visible rash. Extremity: No gross deformity of bilateral upper extremities.  No gross deformity of    bilateral lower extremities. Neurologic: Alert. Appropriate mood.   Studies:  No results found for this basename: HGB:2,WBC:2,PLT:2 in the last 72 hours  No results found for this basename: NA:2,K:2,CL:2,CO2:2,BUN:2,CREATININE:2,CALCIUM:2,MAGNESIUM:2,GFRNONAA:2,GFRAA:2 in the last 72 hours   No results found for this basename: PT:2,INR:2,APTT:2 in the last 72 hours   No components found with this basename: ABG:2    Assessment:  1. Left ureteral, renal calculi  2. BPH, bladder tumor  Plan: Left ESL

## 2011-10-31 ENCOUNTER — Encounter (HOSPITAL_COMMUNITY): Payer: Self-pay | Admitting: Pharmacy Technician

## 2011-10-31 ENCOUNTER — Encounter (HOSPITAL_COMMUNITY): Payer: Self-pay | Admitting: *Deleted

## 2011-10-31 NOTE — Pre-Procedure Instructions (Signed)
Asked to bring blue folder the day of the procedure,insurance card,I.D. driver's license,wear comfortable clothing and have a driver for the day. Asked not to take Advil,Motrin,Ibuprofen,Aleve or any NSAIDS, Aspirin, or Toradol for 72 hours prior to procedure,  No vitamins or herbal medications 7 days prior to procedure. Instructed to take laxative per doctor's office instructions and eat a light dinner the evening before procedure.   To arrive at 0530  for lithotripsy procedure. 

## 2011-11-02 ENCOUNTER — Encounter (HOSPITAL_COMMUNITY): Payer: Self-pay | Admitting: *Deleted

## 2011-11-02 ENCOUNTER — Encounter (HOSPITAL_COMMUNITY)
Admission: RE | Disposition: A | Discharge status: Home / Self Care | Payer: Self-pay | Source: Ambulatory Visit | Attending: Urology

## 2011-11-02 ENCOUNTER — Ambulatory Visit (HOSPITAL_COMMUNITY)
Admission: RE | Admit: 2011-11-02 | Discharge: 2011-11-02 | Disposition: A | Discharge status: Home / Self Care | Payer: Medicare Other | Source: Ambulatory Visit | Attending: Urology | Admitting: Urology

## 2011-11-02 ENCOUNTER — Ambulatory Visit (HOSPITAL_COMMUNITY): Payer: Medicare Other

## 2011-11-02 DIAGNOSIS — D494 Neoplasm of unspecified behavior of bladder: Secondary | ICD-10-CM | POA: Diagnosis not present

## 2011-11-02 DIAGNOSIS — N201 Calculus of ureter: Secondary | ICD-10-CM | POA: Diagnosis not present

## 2011-11-02 DIAGNOSIS — Z01818 Encounter for other preprocedural examination: Secondary | ICD-10-CM | POA: Diagnosis not present

## 2011-11-02 DIAGNOSIS — C3490 Malignant neoplasm of unspecified part of unspecified bronchus or lung: Secondary | ICD-10-CM | POA: Diagnosis not present

## 2011-11-02 DIAGNOSIS — N200 Calculus of kidney: Secondary | ICD-10-CM | POA: Diagnosis not present

## 2011-11-02 DIAGNOSIS — Z85118 Personal history of other malignant neoplasm of bronchus and lung: Secondary | ICD-10-CM | POA: Diagnosis not present

## 2011-11-02 DIAGNOSIS — N400 Enlarged prostate without lower urinary tract symptoms: Secondary | ICD-10-CM | POA: Insufficient documentation

## 2011-11-02 SURGERY — LITHOTRIPSY, ESWL
Anesthesia: LOCAL | Laterality: Left

## 2011-11-02 MED ORDER — SODIUM CHLORIDE 0.9 % IV SOLN: 250.0000 mL | INTRAVENOUS | Status: DC | PRN

## 2011-11-02 MED ORDER — SODIUM CHLORIDE 0.9 % IJ SOLN: 3.0000 mL | Freq: Two times a day (BID) | INTRAMUSCULAR | Status: DC

## 2011-11-02 MED ORDER — CIPROFLOXACIN IN D5W 400 MG/200ML IV SOLN
400.0000 mg | INTRAVENOUS | Status: AC
  Administered 2011-11-02: 400 mg via INTRAVENOUS
  Filled 2011-11-02: qty 200

## 2011-11-02 MED ORDER — ACETAMINOPHEN 650 MG RE SUPP
650.0000 mg | RECTAL | Status: DC | PRN
  Filled 2011-11-02: qty 1

## 2011-11-02 MED ORDER — OXYCODONE HCL 5 MG PO TABS: 5.0000 mg | ORAL_TABLET | ORAL | Status: DC | PRN

## 2011-11-02 MED ORDER — MORPHINE SULFATE 10 MG/ML IJ SOLN: 2.0000 mg | INTRAMUSCULAR | Status: DC | PRN

## 2011-11-02 MED ORDER — OXYCODONE-ACETAMINOPHEN 5-325 MG PO TABS: 1.0000 | ORAL_TABLET | ORAL | Status: DC | PRN

## 2011-11-02 MED ORDER — SODIUM CHLORIDE 0.9 % IJ SOLN: 3.0000 mL | INTRAMUSCULAR | Status: DC | PRN

## 2011-11-02 MED ORDER — ACETAMINOPHEN 10 MG/ML IV SOLN: 1000.0000 mg | Freq: Four times a day (QID) | INTRAVENOUS | Status: DC

## 2011-11-02 MED ORDER — ACETAMINOPHEN 325 MG PO TABS: 650.0000 mg | ORAL_TABLET | ORAL | Status: DC | PRN

## 2011-11-02 MED ORDER — DEXTROSE-NACL 5-0.45 % IV SOLN
INTRAVENOUS | Status: DC
  Administered 2011-11-02: 07:00:00 via INTRAVENOUS

## 2011-11-02 MED ORDER — ONDANSETRON HCL 4 MG/2ML IJ SOLN: 4.0000 mg | Freq: Four times a day (QID) | INTRAMUSCULAR | Status: DC | PRN

## 2011-11-02 MED ORDER — DIAZEPAM 5 MG PO TABS
10.0000 mg | ORAL_TABLET | ORAL | Status: AC
  Administered 2011-11-02: 10 mg via ORAL
  Filled 2011-11-02: qty 2

## 2011-11-02 MED ORDER — DIPHENHYDRAMINE HCL 25 MG PO CAPS
25.0000 mg | ORAL_CAPSULE | ORAL | Status: AC
  Administered 2011-11-02: 25 mg via ORAL
  Filled 2011-11-02: qty 1

## 2011-11-02 NOTE — Interval H&P Note (Signed)
History and Physical Interval Note:  11/02/2011 8:04 AM  Raymond Patrick  has presented today for surgery, with the diagnosis of LEFT URETERAL STONE  The various methods of treatment have been discussed with the patient and family. After consideration of risks, benefits and other options for treatment, the patient has consented to  Procedure(s) (LRB) with comments: EXTRACORPOREAL SHOCK WAVE LITHOTRIPSY (ESWL) (Left) as a surgical intervention .  The patient's history has been reviewed, patient examined, no change in status, stable for surgery.  I have reviewed the patient's chart and labs.  Questions were answered to the patient's satisfaction.     Chelsea Aus

## 2011-11-02 NOTE — H&P (View-Only) (Signed)
  Urology History and Physical Exam  CC: Left kidney stone  HPI: 75 year old male presents for lithotripsy for a persistent 6 mm left sided ureteral calculus. This was first diagnosed in July 2013, and at last visit in late August, it was still present in his left upper ureter. He has also been found to have a bladder tumor and will have a TUR-BT as well as a TURP in the near future.  PMH: Past Medical History  Diagnosis Date  . Renal disorder     renal calculi  . Cancer     lung ca    PSH: Past Surgical History  Procedure Date  . Lung removal, partial 2008  . Lithotripsy 2012    Allergies: Allergies  Allergen Reactions  . Nitrofuran Derivatives Shortness Of Breath    Medications: Prescriptions prior to admission  Medication Sig Dispense Refill  . Cholecalciferol (VITAMIN D) 2000 UNITS CAPS Take 4,000 Units by mouth daily.      . finasteride (PROSCAR) 5 MG tablet Take 5 mg by mouth at bedtime.      . Multiple Vitamin (MULTIVITAMIN WITH MINERALS) TABS Take 1 tablet by mouth daily.      . pravastatin (PRAVACHOL) 40 MG tablet Take 40 mg by mouth at bedtime.      . terazosin (HYTRIN) 10 MG capsule Take 10 mg by mouth at bedtime.         Social History: History   Social History  . Marital Status: Single    Spouse Name: N/A    Number of Children: N/A  . Years of Education: N/A   Occupational History  . Not on file.   Social History Main Topics  . Smoking status: Former Smoker  . Smokeless tobacco: Never Used  . Alcohol Use: Yes     socially  . Drug Use: No  . Sexually Active:    Other Topics Concern  . Not on file   Social History Narrative  . No narrative on file    Family History: History reviewed. No pertinent family history.  Review of Systems: Positive: Blood in urine, decreased FOS, BOO sx's. Negative:  A further 10 point review of systems was negative except what is listed in the HPI.  Physical Exam: @VITALS2@ General: No acute distress.   Awake. Head:  Normocephalic.  Atraumatic. ENT:  EOMI.  Mucous membranes moist Neck:  Supple.  No lymphadenopathy. CV:  S1 present. S2 present. Regular rate. Pulmonary: Equal effort bilaterally.  Clear to auscultation bilaterally. Abdomen: Soft.  Nontender to palpation. Skin:  Normal turgor.  No visible rash. Extremity: No gross deformity of bilateral upper extremities.  No gross deformity of    bilateral lower extremities. Neurologic: Alert. Appropriate mood.   Studies:  No results found for this basename: HGB:2,WBC:2,PLT:2 in the last 72 hours  No results found for this basename: NA:2,K:2,CL:2,CO2:2,BUN:2,CREATININE:2,CALCIUM:2,MAGNESIUM:2,GFRNONAA:2,GFRAA:2 in the last 72 hours   No results found for this basename: PT:2,INR:2,APTT:2 in the last 72 hours   No components found with this basename: ABG:2    Assessment:  1. Left ureteral, renal calculi  2. BPH, bladder tumor  Plan: Left ESL    

## 2011-11-21 DIAGNOSIS — N201 Calculus of ureter: Secondary | ICD-10-CM | POA: Diagnosis not present

## 2011-11-29 DIAGNOSIS — C675 Malignant neoplasm of bladder neck: Secondary | ICD-10-CM | POA: Diagnosis not present

## 2011-11-29 DIAGNOSIS — N401 Enlarged prostate with lower urinary tract symptoms: Secondary | ICD-10-CM | POA: Diagnosis not present

## 2011-11-29 DIAGNOSIS — Z87442 Personal history of urinary calculi: Secondary | ICD-10-CM | POA: Diagnosis not present

## 2011-12-01 ENCOUNTER — Other Ambulatory Visit: Payer: Self-pay | Admitting: Urology

## 2011-12-08 DIAGNOSIS — Z23 Encounter for immunization: Secondary | ICD-10-CM | POA: Diagnosis not present

## 2011-12-19 ENCOUNTER — Encounter (HOSPITAL_BASED_OUTPATIENT_CLINIC_OR_DEPARTMENT_OTHER): Payer: Self-pay | Admitting: *Deleted

## 2011-12-19 NOTE — Progress Notes (Signed)
NPO AFTER MN. ARRIVES AT 0700. NEEDS HG. CURRENT CHEST CT IN EPIC AND CHART. REVIEWED [facility] GUIDELINES. PT COUSIN WILL PICK-UP AT DISCHARGE ,  KERRY TRENT.

## 2011-12-22 DIAGNOSIS — E782 Mixed hyperlipidemia: Secondary | ICD-10-CM | POA: Diagnosis not present

## 2011-12-22 DIAGNOSIS — E559 Vitamin D deficiency, unspecified: Secondary | ICD-10-CM | POA: Diagnosis not present

## 2011-12-22 DIAGNOSIS — Z79899 Other long term (current) drug therapy: Secondary | ICD-10-CM | POA: Diagnosis not present

## 2011-12-22 DIAGNOSIS — I10 Essential (primary) hypertension: Secondary | ICD-10-CM | POA: Diagnosis not present

## 2011-12-25 ENCOUNTER — Encounter (HOSPITAL_BASED_OUTPATIENT_CLINIC_OR_DEPARTMENT_OTHER): Payer: Self-pay | Admitting: Anesthesiology

## 2011-12-25 ENCOUNTER — Ambulatory Visit (HOSPITAL_BASED_OUTPATIENT_CLINIC_OR_DEPARTMENT_OTHER)
Admission: RE | Admit: 2011-12-25 | Discharge: 2011-12-26 | Disposition: A | Discharge status: Home / Self Care | Payer: Medicare Other | Source: Ambulatory Visit | Attending: Urology | Admitting: Urology

## 2011-12-25 ENCOUNTER — Ambulatory Visit (HOSPITAL_BASED_OUTPATIENT_CLINIC_OR_DEPARTMENT_OTHER): Payer: Medicare Other | Admitting: Anesthesiology

## 2011-12-25 ENCOUNTER — Encounter (HOSPITAL_BASED_OUTPATIENT_CLINIC_OR_DEPARTMENT_OTHER): Payer: Self-pay | Admitting: *Deleted

## 2011-12-25 ENCOUNTER — Encounter (HOSPITAL_BASED_OUTPATIENT_CLINIC_OR_DEPARTMENT_OTHER)
Admission: RE | Disposition: A | Discharge status: Home / Self Care | Payer: Self-pay | Source: Ambulatory Visit | Attending: Urology

## 2011-12-25 DIAGNOSIS — C679 Malignant neoplasm of bladder, unspecified: Secondary | ICD-10-CM | POA: Insufficient documentation

## 2011-12-25 DIAGNOSIS — E785 Hyperlipidemia, unspecified: Secondary | ICD-10-CM | POA: Insufficient documentation

## 2011-12-25 DIAGNOSIS — N138 Other obstructive and reflux uropathy: Secondary | ICD-10-CM | POA: Insufficient documentation

## 2011-12-25 DIAGNOSIS — C675 Malignant neoplasm of bladder neck: Secondary | ICD-10-CM | POA: Diagnosis not present

## 2011-12-25 DIAGNOSIS — Z85118 Personal history of other malignant neoplasm of bronchus and lung: Secondary | ICD-10-CM | POA: Insufficient documentation

## 2011-12-25 DIAGNOSIS — R339 Retention of urine, unspecified: Secondary | ICD-10-CM | POA: Insufficient documentation

## 2011-12-25 DIAGNOSIS — J4489 Other specified chronic obstructive pulmonary disease: Secondary | ICD-10-CM | POA: Insufficient documentation

## 2011-12-25 DIAGNOSIS — N400 Enlarged prostate without lower urinary tract symptoms: Secondary | ICD-10-CM | POA: Diagnosis not present

## 2011-12-25 DIAGNOSIS — D494 Neoplasm of unspecified behavior of bladder: Secondary | ICD-10-CM | POA: Diagnosis not present

## 2011-12-25 DIAGNOSIS — C61 Malignant neoplasm of prostate: Secondary | ICD-10-CM | POA: Insufficient documentation

## 2011-12-25 DIAGNOSIS — N401 Enlarged prostate with lower urinary tract symptoms: Secondary | ICD-10-CM | POA: Diagnosis not present

## 2011-12-25 DIAGNOSIS — J449 Chronic obstructive pulmonary disease, unspecified: Secondary | ICD-10-CM | POA: Diagnosis not present

## 2011-12-25 HISTORY — DX: Personal history of other malignant neoplasm of bronchus and lung: Z85.118

## 2011-12-25 HISTORY — DX: Personal history of urinary calculi: Z87.442

## 2011-12-25 HISTORY — PX: TRANSURETHRAL RESECTION OF BLADDER TUMOR: SHX2575

## 2011-12-25 HISTORY — DX: Chronic obstructive pulmonary disease, unspecified: J44.9

## 2011-12-25 HISTORY — DX: Unspecified hearing loss, unspecified ear: H91.90

## 2011-12-25 HISTORY — DX: Hyperlipidemia, unspecified: E78.5

## 2011-12-25 HISTORY — PX: TRANSURETHRAL RESECTION OF PROSTATE: SHX73

## 2011-12-25 HISTORY — DX: Neoplasm of unspecified behavior of bladder: D49.4

## 2011-12-25 HISTORY — DX: Reserved for inherently not codable concepts without codable children: IMO0001

## 2011-12-25 SURGERY — TRANSURETHRAL RESECTION OF THE PROSTATE WITH GYRUS INSTRUMENTS
Anesthesia: General | Site: Prostate | Wound class: Clean Contaminated

## 2011-12-25 MED ORDER — FENTANYL CITRATE 0.05 MG/ML IJ SOLN
INTRAMUSCULAR | Status: DC | PRN
  Administered 2011-12-25: 25 ug via INTRAVENOUS
  Administered 2011-12-25: 50 ug via INTRAVENOUS
  Administered 2011-12-25: 25 ug via INTRAVENOUS

## 2011-12-25 MED ORDER — EPHEDRINE SULFATE 50 MG/ML IJ SOLN
INTRAMUSCULAR | Status: DC | PRN
  Administered 2011-12-25 (×2): 10 mg via INTRAVENOUS

## 2011-12-25 MED ORDER — ONDANSETRON HCL 4 MG/2ML IJ SOLN
4.0000 mg | INTRAMUSCULAR | Status: DC | PRN
  Filled 2011-12-25: qty 2

## 2011-12-25 MED ORDER — BELLADONNA ALKALOIDS-OPIUM 16.2-60 MG RE SUPP
RECTAL | Status: DC | PRN
  Administered 2011-12-25: 1 via RECTAL

## 2011-12-25 MED ORDER — TERAZOSIN HCL 5 MG PO CAPS
10.0000 mg | ORAL_CAPSULE | Freq: Every day | ORAL | Status: DC
  Filled 2011-12-25 (×2): qty 2

## 2011-12-25 MED ORDER — BELLADONNA ALKALOIDS-OPIUM 16.2-60 MG RE SUPP
1.0000 | Freq: Four times a day (QID) | RECTAL | Status: DC | PRN
  Filled 2011-12-25: qty 1

## 2011-12-25 MED ORDER — SIMVASTATIN 5 MG PO TABS
5.0000 mg | ORAL_TABLET | Freq: Every day | ORAL | Status: DC
Start: 2011-12-25 — End: 2011-12-26
  Administered 2011-12-25: 5 mg via ORAL
  Filled 2011-12-25 (×2): qty 1

## 2011-12-25 MED ORDER — LIDOCAINE HCL (CARDIAC) 20 MG/ML IV SOLN
INTRAVENOUS | Status: DC | PRN
  Administered 2011-12-25: 60 mg via INTRAVENOUS

## 2011-12-25 MED ORDER — CIPROFLOXACIN HCL 250 MG PO TABS
250.0000 mg | ORAL_TABLET | Freq: Two times a day (BID) | ORAL | Status: AC
  Administered 2011-12-25: 250 mg via ORAL
  Filled 2011-12-25 (×3): qty 1

## 2011-12-25 MED ORDER — SODIUM CHLORIDE 0.45 % IV SOLN
INTRAVENOUS | Status: DC
  Administered 2011-12-26: 01:00:00 via INTRAVENOUS
  Filled 2011-12-25: qty 1000

## 2011-12-25 MED ORDER — HYDROMORPHONE HCL PF 1 MG/ML IJ SOLN
0.2500 mg | INTRAMUSCULAR | Status: DC | PRN
  Filled 2011-12-25: qty 1

## 2011-12-25 MED ORDER — LACTATED RINGERS IV SOLN
INTRAVENOUS | Status: DC
  Administered 2011-12-25: 12:00:00 via INTRAVENOUS
  Filled 2011-12-25: qty 1000

## 2011-12-25 MED ORDER — SODIUM CHLORIDE 0.9 % IR SOLN
Status: DC | PRN
  Administered 2011-12-25: 12000 mL via INTRAVESICAL

## 2011-12-25 MED ORDER — CIPROFLOXACIN IN D5W 400 MG/200ML IV SOLN
400.0000 mg | INTRAVENOUS | Status: AC
  Administered 2011-12-25: 400 mg via INTRAVENOUS
  Filled 2011-12-25: qty 200

## 2011-12-25 MED ORDER — PROPOFOL 10 MG/ML IV BOLUS
INTRAVENOUS | Status: DC | PRN
  Administered 2011-12-25: 160 mg via INTRAVENOUS

## 2011-12-25 MED ORDER — STERILE WATER FOR IRRIGATION IR SOLN
Status: DC | PRN
  Administered 2011-12-25: 1

## 2011-12-25 MED ORDER — DEXAMETHASONE SODIUM PHOSPHATE 4 MG/ML IJ SOLN
INTRAMUSCULAR | Status: DC | PRN
  Administered 2011-12-25: 10 mg via INTRAVENOUS

## 2011-12-25 MED ORDER — LACTATED RINGERS IV SOLN
INTRAVENOUS | Status: DC
  Administered 2011-12-25 (×2): via INTRAVENOUS
  Filled 2011-12-25: qty 1000

## 2011-12-25 MED ORDER — ZOLPIDEM TARTRATE 5 MG PO TABS
5.0000 mg | ORAL_TABLET | Freq: Every evening | ORAL | Status: DC | PRN
  Filled 2011-12-25: qty 1

## 2011-12-25 MED ORDER — HYDROCODONE-ACETAMINOPHEN 5-325 MG PO TABS
1.0000 | ORAL_TABLET | ORAL | Status: DC | PRN
  Filled 2011-12-25: qty 2

## 2011-12-25 MED ORDER — CIPROFLOXACIN HCL 250 MG PO TABS: 250.0000 mg | ORAL_TABLET | Freq: Two times a day (BID) | ORAL | Status: DC

## 2011-12-25 MED ORDER — PROMETHAZINE HCL 25 MG/ML IJ SOLN
6.2500 mg | INTRAMUSCULAR | Status: DC | PRN
  Filled 2011-12-25: qty 1

## 2011-12-25 MED ORDER — DOCUSATE SODIUM 100 MG PO CAPS
100.0000 mg | ORAL_CAPSULE | Freq: Two times a day (BID) | ORAL | Status: DC
  Administered 2011-12-25: 100 mg via ORAL
  Filled 2011-12-25 (×2): qty 1

## 2011-12-25 SURGICAL SUPPLY — 36 items
BAG DRAIN URO-CYSTO SKYTR STRL (DRAIN) ×3 IMPLANT
BAG URINE DRAINAGE (UROLOGICAL SUPPLIES) ×3 IMPLANT
BAG URINE LEG 19OZ MD ST LTX (BAG) IMPLANT
CANISTER SUCT LVC 12 LTR MEDI- (MISCELLANEOUS) ×6 IMPLANT
CATH FOLEY 2WAY SLVR  5CC 20FR (CATHETERS)
CATH FOLEY 2WAY SLVR  5CC 22FR (CATHETERS)
CATH FOLEY 2WAY SLVR 30CC 22FR (CATHETERS) IMPLANT
CATH FOLEY 2WAY SLVR 5CC 20FR (CATHETERS) IMPLANT
CATH FOLEY 2WAY SLVR 5CC 22FR (CATHETERS) IMPLANT
CATH FOLEY 3WAY 30CC 22F (CATHETERS) ×3 IMPLANT
CATH HEMA 3WAY 30CC 24FR COUDE (CATHETERS) IMPLANT
CATH HEMA 3WAY 30CC 24FR RND (CATHETERS) IMPLANT
CLOTH BEACON ORANGE TIMEOUT ST (SAFETY) ×3 IMPLANT
DRAPE CAMERA CLOSED 9X96 (DRAPES) ×3 IMPLANT
ELECT BUTTON BIOP 24F 90D PLAS (MISCELLANEOUS) IMPLANT
ELECT BUTTON HF 24-28F 2 30DE (ELECTRODE) ×3 IMPLANT
ELECT LOOP HF 26F 30D .35MM (CUTTING LOOP) IMPLANT
ELECT LOOP MED HF 24F 12D CBL (CLIP) IMPLANT
ELECT NEEDLE 45D HF 24-28F 12D (CUTTING LOOP) IMPLANT
ELECT REM PT RETURN 9FT ADLT (ELECTROSURGICAL) ×3
ELECTRODE REM PT RTRN 9FT ADLT (ELECTROSURGICAL) ×2 IMPLANT
EVACUATOR MICROVAS BLADDER (UROLOGICAL SUPPLIES) IMPLANT
GLOVE BIO SURGEON STRL SZ8 (GLOVE) ×3 IMPLANT
GLOVE INDICATOR 6.5 STRL GRN (GLOVE) ×3 IMPLANT
GOWN PREVENTION PLUS LG XLONG (DISPOSABLE) ×3 IMPLANT
GOWN STRL REIN XL XLG (GOWN DISPOSABLE) ×3 IMPLANT
GOWN XL W/COTTON TOWEL STD (GOWNS) ×3 IMPLANT
HOLDER FOLEY CATH W/STRAP (MISCELLANEOUS) ×3 IMPLANT
IV NS IRRIG 3000ML ARTHROMATIC (IV SOLUTION) ×12 IMPLANT
KIT ASPIRATION TUBING (SET/KITS/TRAYS/PACK) ×3 IMPLANT
LOOP CUTTING 24FR OLYMPUS (CUTTING LOOP) IMPLANT
PACK CYSTOSCOPY (CUSTOM PROCEDURE TRAY) ×3 IMPLANT
PLUG CATH AND CAP STER (CATHETERS) IMPLANT
SET ASPIRATION TUBING (TUBING) IMPLANT
SYR 30ML LL (SYRINGE) ×3 IMPLANT
SYRINGE IRR TOOMEY STRL 70CC (SYRINGE) ×3 IMPLANT

## 2011-12-25 NOTE — Anesthesia Postprocedure Evaluation (Signed)
Anesthesia Post Note  Patient: Raymond Patrick  Procedure(s) Performed: Procedure(s) (LRB): TRANSURETHRAL RESECTION OF THE PROSTATE WITH GYRUS INSTRUMENTS (N/A) TRANSURETHRAL RESECTION OF BLADDER TUMOR (TURBT) (N/A)  Anesthesia type: General  Patient location: PACU  Post pain: Pain level controlled  Post assessment: Post-op Vital signs reviewed  Last Vitals:  Filed Vitals:   12/25/11 1025  BP: 114/71  Pulse: 62  Temp: 36.1 C  Resp: 12    Post vital signs: Reviewed  Level of consciousness: sedated  Complications: No apparent anesthesia complications

## 2011-12-25 NOTE — Anesthesia Procedure Notes (Signed)
Procedure Name: LMA Insertion Date/Time: 12/25/2011 8:30 AM Performed by: Fran Lowes Pre-anesthesia Checklist: Patient identified, Emergency Drugs available, Suction available and Patient being monitored Patient Re-evaluated:Patient Re-evaluated prior to inductionOxygen Delivery Method: Circle System Utilized Preoxygenation: Pre-oxygenation with 100% oxygen Intubation Type: IV induction Ventilation: Mask ventilation without difficulty LMA: LMA inserted LMA Size: 4.0 Number of attempts: 1 Airway Equipment and Method: bite block Placement Confirmation: positive ETCO2 Dental Injury: Teeth and Oropharynx as per pre-operative assessment

## 2011-12-25 NOTE — Interval H&P Note (Signed)
History and Physical Interval Note:  12/25/2011 8:17 AM  Raymond Patrick  has presented today for surgery, with the diagnosis of BENIGN PROSTATIC HYPERTROPHY, BLADDER TUMOR  The various methods of treatment have been discussed with the patient and family. After consideration of risks, benefits and other options for treatment, the patient has consented to  Procedure(s) (LRB) with comments: TRANSURETHRAL RESECTION OF THE PROSTATE WITH GYRUS INSTRUMENTS (N/A) - 2 HRS  TRANSURETHRAL RESECTION OF BLADDER TUMOR (TURBT) (N/A) as a surgical intervention .  The patient's history has been reviewed, patient examined, no change in status, stable for surgery.  I have reviewed the patient's chart and labs.  Questions were answered to the patient's satisfaction.     Chelsea Aus

## 2011-12-25 NOTE — Progress Notes (Signed)
Tolerated lunch well.  Ate soup and crackers. [place].

## 2011-12-25 NOTE — Transfer of Care (Signed)
Immediate Anesthesia Transfer of Care Note  Patient: Raymond Patrick  Procedure(s) Performed: Procedure(s) (LRB): TRANSURETHRAL RESECTION OF THE PROSTATE WITH GYRUS INSTRUMENTS (N/A) TRANSURETHRAL RESECTION OF BLADDER TUMOR (TURBT) (N/A)  Patient Location: Patient transported to PACU with oxygen via face mask at 4 Liters / Min  Anesthesia Type: General  Level of Consciousness: awake and alert   Airway & Oxygen Therapy: Patient Spontanous Breathing and Patient connected to face mask oxygen  Post-op Assessment: Report given to PACU RN and Post -op Vital signs reviewed and stable  Post vital signs: Reviewed and stable  Dentition: Teeth and oropharynx remain in pre-op condition  Complications: No apparent anesthesia complications

## 2011-12-25 NOTE — Anesthesia Preprocedure Evaluation (Signed)
Anesthesia Evaluation  Patient identified by MRN, date of birth, ID band Patient awake    Reviewed: Allergy & Precautions, H&P , NPO status , Patient's Chart, lab work & pertinent test results  Airway Mallampati: II TM Distance: >3 FB Neck ROM: Full    Dental  (+) Teeth Intact, Caps and Dental Advisory Given   Pulmonary neg pulmonary ROS, COPD S/P right upper lobectomy secondary to lung CA breath sounds clear to auscultation  Pulmonary exam normal       Cardiovascular negative cardio ROS  Rhythm:Regular Rate:Normal     Neuro/Psych negative neurological ROS  negative psych ROS   GI/Hepatic negative GI ROS, Neg liver ROS,   Endo/Other  negative endocrine ROS  Renal/GU negative Renal ROS  negative genitourinary   Musculoskeletal negative musculoskeletal ROS (+)   Abdominal   Peds negative pediatric ROS (+)  Hematology negative hematology ROS (+)   Anesthesia Other Findings   Reproductive/Obstetrics negative OB ROS                           Anesthesia Physical Anesthesia Plan  ASA: III  Anesthesia Plan: General   Post-op Pain Management:    Induction: Intravenous  Airway Management Planned: LMA  Additional Equipment:   Intra-op Plan:   Post-operative Plan: Extubation in OR  Informed Consent: I have reviewed the patients History and Physical, chart, labs and discussed the procedure including the risks, benefits and alternatives for the proposed anesthesia with the patient or authorized representative who has indicated his/her understanding and acceptance.   Dental advisory given  Plan Discussed with: CRNA  Anesthesia Plan Comments:         Anesthesia Quick Evaluation

## 2011-12-25 NOTE — H&P (Signed)
Urology History and Physical Exam  CC: Bladder tumor and enlarged prostate  HPI: 76 year old male presents for TUR-BT and TUR-P. His history is detailed below:   LEFT RENAL/URETERAL CALCULI  He underwent lithotripsy on 11/02/2011. Prior treatment, he had an upper ureteral stone on the left, and a lower pole stone. At the time of lithotripsy, the ureteral stone was in the pelvic ureter. He underwent lithotripsy of both of these. He had no significant issues following treatment, and passed multiple stones without pain.  BPH  The patient has significant lower urinary tract symptomatology with a history of urinary retention. He is on both an alpha-blocker and 5 alpha reductase inhibitor. With him having the need for a TUR-BT and with his history of urinary retention, he will be undergoing a TUR-P.  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 He is not terribly symptomatic from this, but it was recommended that he have this taken care of following treatment of his symptomatic ureteral stones.    PMH: Past Medical History  Diagnosis Date  . BPH (benign prostatic hypertrophy)   . Hyperlipidemia   . COPD (chronic obstructive pulmonary disease)   . History of kidney stones   . Bladder tumor   . Impaired hearing bilateral aids  . History of lung cancer 2008  S/P RIGHT UPPER LOPECTOMY     STAGE I  NON-SMALL CELL LUNG CANCER--  ONCOLOGIST- DR Arbutus Ped-   NO RECURRENCE    PSH: Past Surgical History  Procedure Date  . Lung removal, partial 2008  . Lithotripsy 2012  . Fiberoptic bronchoscopy 10-28-2008  . Right upper lung lobectomy 09-21-2006  DR BURNEY    NON-SMALL CELL CANCER/ SEVERE COPD    Allergies: Allergies  Allergen Reactions  . Nitrofuran Derivatives Shortness Of Breath    Medications: No prescriptions prior to  admission     Social History: History   Social History  . Marital Status: Single    Spouse Name: N/A    Number of Children: N/A  . Years of Education: N/A   Occupational History  . Not on file.   Social History Main Topics  . Smoking status: Former Smoker -- 1.0 packs/day for 35 years    Types: Cigarettes  . Smokeless tobacco: Never Used  . Alcohol Use: Yes     Comment: socially  . Drug Use: No  . Sexually Active:    Other Topics Concern  . Not on file   Social History Narrative  . No narrative on file    Family History: History reviewed. No pertinent family history.  Review of Systems: Positive: Slow stream, frequency, feeling of incomplete emptying, intermittency, nocturia Negative:   A further 10 point review of systems was negative except what is listed in the HPI.  Physical Exam: @VITALS2 @ General: No acute distress.  Awake. Head:  Normocephalic.  Atraumatic. ENT:  EOMI.  Mucous membranes moist Neck:  Supple.  No lymphadenopathy. CV:  S1 present. S2 present. Regular rate. Pulmonary: Equal effort bilaterally.  Clear to auscultation bilaterally. Abdomen: Soft.  Non tender to palpation. Skin:  Normal turgor.  No visible rash. Extremity: No gross deformity of bilateral upper extremities.  No gross deformity of    bilateral lower extremities. Neurologic: Alert. Appropriate mood.   Studies:  No results found for this basename: HGB:2,WBC:2,PLT:2 in the last 72 hours  No results found for this basename: NA:2,K:2,CL:2,CO2:2,BUN:2,CREATININE:2,CALCIUM:2,MAGNESIUM:2,GFRNONAA:2,GFRAA:2 in the last 72 hours   No results found for this basename: PT:2,INR:2,APTT:2 in the last 72 hours   No components found with this basename: ABG:2    Assessment:   1. BPH--significantly symptomatic on maximal medical therapy  2. 2 cm papillary bladder tumor  Plan: Combined TUR-BT and TUR-P with overnight stay

## 2011-12-25 NOTE — Progress Notes (Signed)
Pt transferred to [facility] via stretcher.  Pt able to transfer to bed w/ minimal assistance.  VSS.  CBI continued.  Urine clear, pink.  Foley patent.  Tolerating fluids well.  Oriented to unit. Pt states he didn't bring home meds. Will continue to monitor.

## 2011-12-25 NOTE — Progress Notes (Signed)
Assessment complete patient able to verbalise needs no complaint of pain or discomfort. Call light in reach.

## 2011-12-25 NOTE — Op Note (Signed)
Preoperative diagnosis: Bladder neck tumor, BPH  Postoperative diagnosis: Same   Procedure: TUR bladder neck tumor, 3 cm, TURP    Surgeon: Bertram Millard. Tashica Provencio, M.D.   Anesthesia: Gen.   Complications: None  Specimen(s): 1. Bladder neck lesion  2. Prostate chips  Drain(s): 22 [place], three-way Foley catheter to CBI  Indications: 76 year old male with an extensive urologic history recently including recurrent urolithiasis. Approximately 2-3 months ago, for a history of retention and hematuria, he underwent CT and cystoscopy. This was normal except for a large prostate and cystoscopically and obstructive prostate with a tumor on the median lobe. He has significant lower urinary tract symptoms, and is on finasteride and now for blocker for these. Despite this, he has had retention. With his bladder neck tumor and BPH, it was recommended that he undergo a combined procedure of TURBT and TURP. Risks and complications of the procedure have been discussed with the patient. He understands these and desires to proceed.   Technique and findings: the patient was properly identified in the holding area and received preoperative IV antibiotics. He was taken to the operating room where general anesthesia was administered with the LMA. He was placed in the dorsolithotomy position. Genitalia and perineum were prepped and draped. Proper timeout was then performed.  I then passed a 36 French resectoscope sheath with the obturator. The cystoscope was placed within this, and systematic survey of the bladder was performed. He had scattered 1+ trabeculations. Ureteral orifices were normal in configuration and location. To the right, on the intravesical median lobe, there was a tumor approximately 3 cm in size. This was fairly smooth, and not necessarily papillary. This may have been a bladder tumor, or an eccentric bladder neck growth/BPH. There was a large median lobe as well, without shoes which lateral lobes. I  passed the resectoscope element and cutting loop. The bladder neck tumor/growth was then resected down to the prostatic tissue with the cutting loop. This was then sent as "bladder neck lesion". I then resected the median lobe down to the surgical capsule, from the bladder neck all the way to the verumontanum. The verumontanum was left intact as a landmark. The smaller median lobes were then resected down to surgical capsule as well, first the left and then the right, with a small amount of anterior tissue resected. Again, all resection was taken down to the surgical capsule. Small bleeders were then likewise dilated after the majority of the prostatic tissue was resected. The chips were then irrigated from the bladder and sent as "prostate chips". I then reinspected the prostatic fossa. Smaller vessels were electrocoagulated until adequate hemostasis was identified. The scope was then removed. I passed a 22 French Foley catheter, three-way, with 30 cc balloon with the help of a catheter guide. This was hooked to CBI with normal saline. Direct the patient tolerated procedure well. He was awakened and taken to the PACU in stable condition. A B. and O. suppository was placed prior to the initiation of the procedure.

## 2011-12-26 ENCOUNTER — Encounter (HOSPITAL_BASED_OUTPATIENT_CLINIC_OR_DEPARTMENT_OTHER): Payer: Self-pay | Admitting: Urology

## 2011-12-26 LAB — POCT HEMOGLOBIN-HEMACUE: Hemoglobin: 14 g/dL (ref 13.0–17.0)

## 2011-12-26 MED ORDER — CIPROFLOXACIN HCL 250 MG PO TABS
250.0000 mg | ORAL_TABLET | Freq: Two times a day (BID) | ORAL | Status: DC
Start: 2011-12-26 — End: 2012-10-22

## 2011-12-26 NOTE — Progress Notes (Signed)
Foley cath with CBI removed without difficulty clear pink urine in drainage bag.

## 2011-12-26 NOTE — Progress Notes (Signed)
Patient ID: Raymond Patrick, male   DOB: 17-Dec-1935, 76 y.o.   MRN: [id number] 1 Day Post-Op Subjective: Patient reports having a good night. He was able to void on several occasions this morning with light pink urine. No complications or problems. We will be discharged this morning in good condition.  Objective: Vital signs in last 24 hours: Temp:  [96.8 F (36 C)-98.1 F (36.7 C)] 98.1 F (36.7 C) (11/19 0507) Pulse Rate:  [62-90] 82  (11/19 0507) Resp:  [11-18] 16  (11/19 0507) BP: (99-143)/(54-80) 99/54 mmHg (11/19 0507) SpO2:  [93 %-100 %] 94 % (11/19 0507)  Intake/Output from previous day: 11/18 0701 - 11/19 0700 In: 6810 [P.O.:1560; I.V.:850] Out: 9700 [Urine:9700] Intake/Output this shift:    Physical Exam:  Constitutional: Vital signs reviewed. WD WN in NAD   Eyes: PERRL, No scleral icterus.   Cardiovascular: RRR Pulmonary/Chest: Normal effort Abdominal: Soft. Non-tender, non-distended, bowel sounds are normal, no masses, organomegaly, or guarding present.  Extremities: No cyanosis or edema   Lab Results: No results found for this basename: HGB:3,HCT:3 in the last 72 hours BMET No results found for this basename: NA:2,K:2,CL:2,CO2:2,GLUCOSE:2,BUN:2,CREATININE:2,CALCIUM:2 in the last 72 hours No results found for this basename: LABPT:3,INR:3 in the last 72 hours No results found for this basename: LABURIN:1 in the last 72 hours Results for orders placed during the hospital encounter of 08/31/11  URINE CULTURE     Status: Normal   Collection Time   08/31/11  7:19 AM      Component Value Range Status Comment   Specimen Description URINE, CATHETERIZED   Final    Special Requests NONE   Final    Culture  Setup Time 08/31/2011 08:23   Final    Colony Count NO GROWTH   Final    Culture NO GROWTH   Final    Report Status 09/01/2011 FINAL   Final     Studies/Results: No results found.  Assessment/Plan:   Patient is doing well. He will be discharged home today  with appropriate follow up with Dr. Rodman Pickle stent.   LOS: 1 day   Raymond Patrick S 12/26/2011, 7:47 AM

## 2012-01-22 DIAGNOSIS — N200 Calculus of kidney: Secondary | ICD-10-CM | POA: Diagnosis not present

## 2012-04-24 DIAGNOSIS — Z8551 Personal history of malignant neoplasm of bladder: Secondary | ICD-10-CM | POA: Diagnosis not present

## 2012-04-24 DIAGNOSIS — N401 Enlarged prostate with lower urinary tract symptoms: Secondary | ICD-10-CM | POA: Diagnosis not present

## 2012-04-24 DIAGNOSIS — C679 Malignant neoplasm of bladder, unspecified: Secondary | ICD-10-CM | POA: Diagnosis not present

## 2012-05-29 DIAGNOSIS — I10 Essential (primary) hypertension: Secondary | ICD-10-CM | POA: Diagnosis not present

## 2012-05-29 DIAGNOSIS — N3000 Acute cystitis without hematuria: Secondary | ICD-10-CM | POA: Diagnosis not present

## 2012-05-29 DIAGNOSIS — Z79899 Other long term (current) drug therapy: Secondary | ICD-10-CM | POA: Diagnosis not present

## 2012-05-29 DIAGNOSIS — E782 Mixed hyperlipidemia: Secondary | ICD-10-CM | POA: Diagnosis not present

## 2012-09-04 DIAGNOSIS — C679 Malignant neoplasm of bladder, unspecified: Secondary | ICD-10-CM | POA: Diagnosis not present

## 2012-09-04 DIAGNOSIS — N401 Enlarged prostate with lower urinary tract symptoms: Secondary | ICD-10-CM | POA: Diagnosis not present

## 2012-09-23 DIAGNOSIS — I10 Essential (primary) hypertension: Secondary | ICD-10-CM | POA: Diagnosis not present

## 2012-09-23 DIAGNOSIS — Z79899 Other long term (current) drug therapy: Secondary | ICD-10-CM | POA: Diagnosis not present

## 2012-09-23 DIAGNOSIS — Z23 Encounter for immunization: Secondary | ICD-10-CM | POA: Diagnosis not present

## 2012-09-23 DIAGNOSIS — E782 Mixed hyperlipidemia: Secondary | ICD-10-CM | POA: Diagnosis not present

## 2012-09-23 DIAGNOSIS — Z1212 Encounter for screening for malignant neoplasm of rectum: Secondary | ICD-10-CM | POA: Diagnosis not present

## 2012-09-23 DIAGNOSIS — E559 Vitamin D deficiency, unspecified: Secondary | ICD-10-CM | POA: Diagnosis not present

## 2012-09-23 DIAGNOSIS — R7309 Other abnormal glucose: Secondary | ICD-10-CM | POA: Diagnosis not present

## 2012-09-23 DIAGNOSIS — C61 Malignant neoplasm of prostate: Secondary | ICD-10-CM | POA: Diagnosis not present

## 2012-09-26 ENCOUNTER — Telehealth: Payer: Self-pay | Admitting: *Deleted

## 2012-09-26 NOTE — Telephone Encounter (Signed)
Lab work from [employer] 09/23/12 given to Dr Donnald Garre to review.  SLJ

## 2012-10-18 ENCOUNTER — Encounter (HOSPITAL_COMMUNITY): Payer: Self-pay

## 2012-10-18 ENCOUNTER — Ambulatory Visit (HOSPITAL_COMMUNITY)
Admission: RE | Admit: 2012-10-18 | Discharge: 2012-10-18 | Disposition: A | Discharge status: Home / Self Care | Payer: Medicare Other | Source: Ambulatory Visit | Attending: Internal Medicine | Admitting: Internal Medicine

## 2012-10-18 ENCOUNTER — Other Ambulatory Visit (HOSPITAL_BASED_OUTPATIENT_CLINIC_OR_DEPARTMENT_OTHER): Payer: Medicare Other

## 2012-10-18 DIAGNOSIS — Z902 Acquired absence of lung [part of]: Secondary | ICD-10-CM | POA: Insufficient documentation

## 2012-10-18 DIAGNOSIS — J438 Other emphysema: Secondary | ICD-10-CM | POA: Diagnosis not present

## 2012-10-18 DIAGNOSIS — I700 Atherosclerosis of aorta: Secondary | ICD-10-CM | POA: Insufficient documentation

## 2012-10-18 DIAGNOSIS — C3490 Malignant neoplasm of unspecified part of unspecified bronchus or lung: Secondary | ICD-10-CM | POA: Diagnosis not present

## 2012-10-18 DIAGNOSIS — N2889 Other specified disorders of kidney and ureter: Secondary | ICD-10-CM | POA: Diagnosis not present

## 2012-10-18 LAB — CBC WITH DIFFERENTIAL/PLATELET
BASO%: 1.1 % (ref 0.0–2.0)
HCT: 41.3 % (ref 38.4–49.9)
MCHC: 34.5 g/dL (ref 32.0–36.0)
MONO#: 0.6 10*3/uL (ref 0.1–0.9)
RBC: 4.49 10*6/uL (ref 4.20–5.82)
RDW: 13.1 % (ref 11.0–14.6)
WBC: 8 10*3/uL (ref 4.0–10.3)
lymph#: 3.1 10*3/uL (ref 0.9–3.3)

## 2012-10-18 LAB — COMPREHENSIVE METABOLIC PANEL (CC13)
ALT: 23 U/L (ref 0–55)
AST: 23 U/L (ref 5–34)
CO2: 27 mEq/L (ref 22–29)
Calcium: 9.5 mg/dL (ref 8.4–10.4)
Chloride: 108 mEq/L (ref 98–109)
Potassium: 4.2 mEq/L (ref 3.5–5.1)
Sodium: 143 mEq/L (ref 136–145)
Total Protein: 6.6 g/dL (ref 6.4–8.3)

## 2012-10-22 ENCOUNTER — Ambulatory Visit (HOSPITAL_BASED_OUTPATIENT_CLINIC_OR_DEPARTMENT_OTHER): Payer: Medicare Other | Admitting: Internal Medicine

## 2012-10-22 ENCOUNTER — Telehealth: Payer: Self-pay | Admitting: Internal Medicine

## 2012-10-22 ENCOUNTER — Encounter: Payer: Self-pay | Admitting: Internal Medicine

## 2012-10-22 VITALS — BP 119/67 | HR 75 | Temp 97.3°F | Resp 18 | Ht 65.5 in | Wt 179.1 lb

## 2012-10-22 DIAGNOSIS — C3490 Malignant neoplasm of unspecified part of unspecified bronchus or lung: Secondary | ICD-10-CM | POA: Diagnosis not present

## 2012-10-22 NOTE — Telephone Encounter (Signed)
gv pt appt schedule for September 2015.  °

## 2012-10-22 NOTE — Progress Notes (Signed)
[facility] Telephone:([phone]) [phone]   Fax:([phone]) [phone]  OFFICE PROGRESS NOTE  Raymond Corwin, MD [address] [place] [address]  DIAGNOSIS: Stage IA (T1a., N0, MX) non-small cell lung cancer, adenocarcinoma diagnosed in July 2008.   PRIOR THERAPY: Status post wedge resection of the right upper lobe with lymph node biopsy under the care of Dr. Edwyna Shell on 09/21/2006   CURRENT THERAPY: Observation.  INTERVAL HISTORY: Raymond Patrick 77 y.o. male returns to the clinic today for annual followup visit with repeat CT scan of the chest. The patient is feeling fine today with no specific complaints. He denied having any significant chest pain, shortness breath, cough or hemoptysis. He denied having any weight loss or night sweats. He had repeat CT scan of the chest performed recently and he is here for evaluation and discussion of his scan results.  MEDICAL HISTORY: Past Medical History  Diagnosis Date  . BPH (benign prostatic hypertrophy)   . Hyperlipidemia   . COPD (chronic obstructive pulmonary disease)   . History of kidney stones   . Bladder tumor   . Impaired hearing bilateral aids  . History of lung cancer 2008  S/P RIGHT UPPER LOPECTOMY     STAGE I  NON-SMALL CELL LUNG CANCER--  ONCOLOGIST- DR Anjel Pardo-   NO RECURRENCE    ALLERGIES:  is allergic to nitrofuran derivatives.  MEDICATIONS:  Current Outpatient Prescriptions  Medication Sig Dispense Refill  . aspirin 81 MG tablet Take 81 mg by mouth daily.      . Cholecalciferol (VITAMIN D) 2000 UNITS CAPS Take 4,000 Units by mouth daily.      . Multiple Vitamin (MULTIVITAMIN WITH MINERALS) TABS Take 1 tablet by mouth every evening.       . Omega-3 Fatty Acids (FISH OIL) 1000 MG CAPS Take 2 capsules by mouth every evening.      . pravastatin (PRAVACHOL) 40 MG tablet Take 40 mg by mouth at bedtime.      Marland Kitchen terazosin (HYTRIN) 10 MG capsule Take 10 mg by mouth daily.       No current  facility-administered medications for this visit.    SURGICAL HISTORY:  Past Surgical History  Procedure Laterality Date  . Lung removal, partial  2008  . Lithotripsy  2012  . Fiberoptic bronchoscopy  10-28-2008  . Right upper lung lobectomy  09-21-2006  DR BURNEY    NON-SMALL CELL CANCER/ SEVERE COPD  . Transurethral resection of prostate  12/25/2011    Procedure: TRANSURETHRAL RESECTION OF THE PROSTATE WITH GYRUS INSTRUMENTS;  Surgeon: Marcine Matar, MD;  Location: [facility];  Service: Urology;  Laterality: N/A;  2 HRS   . Transurethral resection of bladder tumor  12/25/2011    Procedure: TRANSURETHRAL RESECTION OF BLADDER TUMOR (TURBT);  Surgeon: Marcine Matar, MD;  Location: [facility];  Service: Urology;  Laterality: N/A;    REVIEW OF SYSTEMS:  A comprehensive review of systems was negative.   PHYSICAL EXAMINATION: General appearance: alert, cooperative and no distress Head: Normocephalic, without obvious abnormality, atraumatic Neck: no adenopathy Lymph nodes: Cervical, supraclavicular, and axillary nodes normal. Resp: clear to auscultation bilaterally Cardio: regular rate and rhythm, S1, S2 normal, no murmur, click, rub or gallop GI: soft, non-tender; bowel sounds normal; no masses,  no organomegaly Extremities: extremities normal, atraumatic, no cyanosis or edema  ECOG PERFORMANCE STATUS: 1 - Symptomatic but completely ambulatory  Blood pressure 119/67, pulse 75, temperature 97.3 F (36.3 C), temperature source  Oral, resp. rate 18, height 5' 5.5" (1.664 m), weight 179 lb 1.6 oz (81.239 kg).  LABORATORY DATA: Lab Results  Component Value Date   WBC 8.0 10/18/2012   HGB 14.2 10/18/2012   HCT 41.3 10/18/2012   MCV 92.0 10/18/2012   PLT 177 10/18/2012      Chemistry      Component Value Date/Time   NA 143 10/18/2012 0830   NA 142 08/31/2011 0708   NA 141 10/05/2010 0918   K 4.2 10/18/2012 0830   K 4.4 08/31/2011 0708   K 4.3  10/05/2010 0918   CL 108* 10/16/2011 0828   CL 106 08/31/2011 0708   CL 102 10/05/2010 0918   CO2 27 10/18/2012 0830   CO2 26 08/31/2011 0708   CO2 29 10/05/2010 0918   BUN 10.6 10/18/2012 0830   BUN 18 08/31/2011 0708   BUN 11 10/05/2010 0918   CREATININE 0.9 10/18/2012 0830   CREATININE 1.12 08/31/2011 0708   CREATININE 0.9 10/05/2010 0918      Component Value Date/Time   CALCIUM 9.5 10/18/2012 0830   CALCIUM 9.8 08/31/2011 0708   CALCIUM 9.2 10/05/2010 0918   ALKPHOS 82 10/18/2012 0830   ALKPHOS 81 08/31/2011 0708   ALKPHOS 83 10/05/2010 0918   AST 23 10/18/2012 0830   AST 27 08/31/2011 0708   AST 31 10/05/2010 0918   ALT 23 10/18/2012 0830   ALT 26 08/31/2011 0708   ALT 28 10/05/2010 0918   BILITOT 0.71 10/18/2012 0830   BILITOT 0.5 08/31/2011 0708   BILITOT 0.80 10/05/2010 0918       RADIOGRAPHIC STUDIES: Ct Chest Wo Contrast  10/18/2012   CLINICAL DATA:  History of lung cancer diagnosed in 2008 status post surgical resection. Followup study.  EXAM: CT CHEST WITHOUT CONTRAST  TECHNIQUE: Multidetector CT imaging of the chest was performed following the standard protocol without IV contrast.  COMPARISON:  Multiple priors, most recently chest CT 10/16/2011.  FINDINGS: Mediastinum: Heart size is normal. There is no significant pericardial fluid, thickening or pericardial calcification. There is atherosclerosis of the thoracic aorta, the great vessels of the mediastinum and the coronary arteries, including calcified atherosclerotic plaque in the left anterior descending and left circumflex coronary arteries. No pathologically enlarged mediastinal or hilar lymph nodes. Please note that accurate exclusion of hilar adenopathy is limited on noncontrast CT scans. Esophagus is unremarkable in appearance.  Lungs/Pleura: Postoperative changes of wedge resection in the right upper lobe region noted. There is a background of mild to moderate centrilobular emphysema. No suspicious appearing pulmonary nodules or masses  are identified. No acute consolidative airspace disease. No pleural effusions.  Upper Abdomen: 2 mm calcification in the upper pole of the left kidney may represent a vascular calcification or a tiny nonobstructive calculus.  Musculoskeletal: Mild compression of the anterior aspects of T11 and T12 is unchanged compared to the prior study, with approximately 15% loss of anterior vertebral body height at both levels. There are no aggressive appearing lytic or blastic lesions noted in the visualized portions of the skeleton.  IMPRESSION: 1. Status post right upper lobe wedge resection without findings to suggest local recurrence of disease or metastatic disease on today's examination. 2. Moderate centrilobular emphysema again noted. 3. Atherosclerosis, including 2 vessel coronary artery disease. Assessment for potential risk factor modification, dietary therapy or pharmacologic therapy may be warranted, if clinically indicated. 4. 2 mm calcification in the upper pole of the left kidney may represent a nonobstructive calculus or a vascular  calcification.   Electronically Signed   By: Trudie Reed M.D.   On: 10/18/2012 10:12    ASSESSMENT AND PLAN: this is a very pleasant 77 years old white male with history of stage IA non-small cell lung cancer status post wedge resection of the right upper lobe and has been observation since August of 2008 with no evidence for disease recurrence. I discussed the scan results with the patient today. I recommended for him to continue on observation with repeat chest x-ray by his primary care physician in one year. I would see him back for follow up visit at that time and consider the patient for repeat CT scan of the following year. He was advised to call immediately if he has any concerning symptoms in the interval.  The patient voices understanding of current disease status and treatment options and is in agreement with the current care plan.  All questions were answered.  The patient knows to call the clinic with any problems, questions or concerns. We can certainly see the patient much sooner if necessary.

## 2012-11-27 DIAGNOSIS — Z23 Encounter for immunization: Secondary | ICD-10-CM | POA: Diagnosis not present

## 2012-12-23 ENCOUNTER — Encounter: Payer: Self-pay | Admitting: Internal Medicine

## 2012-12-23 DIAGNOSIS — E559 Vitamin D deficiency, unspecified: Secondary | ICD-10-CM | POA: Insufficient documentation

## 2012-12-23 DIAGNOSIS — J449 Chronic obstructive pulmonary disease, unspecified: Secondary | ICD-10-CM

## 2012-12-23 DIAGNOSIS — E782 Mixed hyperlipidemia: Secondary | ICD-10-CM | POA: Insufficient documentation

## 2012-12-23 DIAGNOSIS — E785 Hyperlipidemia, unspecified: Secondary | ICD-10-CM

## 2012-12-23 DIAGNOSIS — Z87891 Personal history of nicotine dependence: Secondary | ICD-10-CM | POA: Insufficient documentation

## 2012-12-23 DIAGNOSIS — I10 Essential (primary) hypertension: Secondary | ICD-10-CM

## 2012-12-24 ENCOUNTER — Encounter: Payer: Self-pay | Admitting: Emergency Medicine

## 2012-12-24 ENCOUNTER — Ambulatory Visit: Payer: Self-pay | Admitting: Physician Assistant

## 2012-12-24 ENCOUNTER — Ambulatory Visit: Payer: Medicare Other | Admitting: Emergency Medicine

## 2012-12-24 VITALS — BP 106/62 | HR 74 | Temp 98.0°F | Resp 18 | Ht 64.5 in | Wt 185.0 lb

## 2012-12-24 DIAGNOSIS — E559 Vitamin D deficiency, unspecified: Secondary | ICD-10-CM

## 2012-12-24 DIAGNOSIS — I10 Essential (primary) hypertension: Secondary | ICD-10-CM

## 2012-12-24 DIAGNOSIS — E782 Mixed hyperlipidemia: Secondary | ICD-10-CM

## 2012-12-24 DIAGNOSIS — S76012A Strain of muscle, fascia and tendon of left hip, initial encounter: Secondary | ICD-10-CM

## 2012-12-24 DIAGNOSIS — IMO0002 Reserved for concepts with insufficient information to code with codable children: Secondary | ICD-10-CM | POA: Diagnosis not present

## 2012-12-24 LAB — BASIC METABOLIC PANEL WITH GFR
BUN: 9 mg/dL (ref 6–23)
Creat: 0.93 mg/dL (ref 0.50–1.35)
GFR, Est African American: >89 mL/min
GFR, Est Non African American: 80 mL/min
Glucose, Bld: 85 mg/dL (ref 70–99)

## 2012-12-24 LAB — CBC WITH DIFFERENTIAL/PLATELET
Basophils Absolute: 0.1 10*3/uL (ref 0.0–0.1)
Eosinophils Relative: 2 % (ref 0–5)
HCT: 41.8 % (ref 39.0–52.0)
Hemoglobin: 14.7 g/dL (ref 13.0–17.0)
Lymphocytes Relative: 43 % (ref 12–46)
MCV: 91.1 fL (ref 78.0–100.0)
Monocytes Absolute: 0.6 10*3/uL (ref 0.1–1.0)
Monocytes Relative: 9 % (ref 3–12)
Neutro Abs: 3.1 10*3/uL (ref 1.7–7.7)
RDW: 13.5 % (ref 11.5–15.5)
WBC: 6.8 10*3/uL (ref 4.0–10.5)

## 2012-12-24 LAB — HEPATIC FUNCTION PANEL
ALT: 26 U/L (ref 0–53)
Albumin: 4.3 g/dL (ref 3.5–5.2)
Alkaline Phosphatase: 73 U/L (ref 39–117)
Total Protein: 6.8 g/dL (ref 6.0–8.3)

## 2012-12-24 LAB — LIPID PANEL
HDL: 42 mg/dL (ref >39)
LDL Cholesterol: 86 mg/dL (ref 0–99)
Triglycerides: 167 mg/dL — ABNORMAL HIGH (ref <150)

## 2012-12-24 NOTE — Patient Instructions (Addendum)
Sciatica with Rehab The sciatic nerve runs from the back down the leg and is responsible for sensation and control of the muscles in the back (posterior) side of the thigh, lower leg, and foot. Sciatica is a condition that is characterized by inflammation of this nerve.  SYMPTOMS   Signs of nerve damage, including numbness and/or weakness along the posterior side of the lower extremity.  Pain in the back of the thigh that may also travel down the leg.  Pain that worsens when sitting for long periods of time.  Occasionally, pain in the back or buttock. CAUSES  Inflammation of the sciatic nerve is the cause of sciatica. The inflammation is due to something irritating the nerve. Common sources of irritation include:  Sitting for long periods of time.  Direct trauma to the nerve.  Arthritis of the spine.  Herniated or ruptured disk.  Slipping of the vertebrae (spondylolithesis)  Pressure from soft tissues, such as muscles or ligament-like tissue (fascia). RISK INCREASES WITH:  Sports that place pressure or stress on the spine (football or weightlifting).  Poor strength and flexibility.  Failure to warm-up properly before activity.  Family history of low back pain or disk disorders.  Previous back injury or surgery.  Poor body mechanics, especially when lifting, or poor posture. PREVENTION   Warm up and stretch properly before activity.  Maintain physical fitness:  Strength, flexibility, and endurance.  Cardiovascular fitness.  Learn and use proper technique, especially with posture and lifting. When possible, have coach correct improper technique.  Avoid activities that place stress on the spine. PROGNOSIS If treated properly, then sciatica usually resolves within 6 weeks. However, occasionally surgery is necessary.  RELATED COMPLICATIONS   Permanent nerve damage, including pain, numbness, tingle, or weakness.  Chronic back pain.  Risks of surgery: infection,  bleeding, nerve damage, or damage to surrounding tissues. TREATMENT Treatment initially involves resting from any activities that aggravate your symptoms. The use of ice and medication may help reduce pain and inflammation. The use of strengthening and stretching exercises may help reduce pain with activity. These exercises may be performed at home or with referral to a therapist. A therapist may recommend further treatments, such as transcutaneous electronic nerve stimulation (TENS) or ultrasound. Your caregiver may recommend corticosteroid injections to help reduce inflammation of the sciatic nerve. If symptoms persist despite non-surgical (conservative) treatment, then surgery may be recommended. MEDICATION  If pain medication is necessary, then nonsteroidal anti-inflammatory medications, such as aspirin and ibuprofen, or other minor pain relievers, such as acetaminophen, are often recommended.  Do not take pain medication for 7 days before surgery.  Prescription pain relievers may be given if deemed necessary by your caregiver. Use only as directed and only as much as you need.  Ointments applied to the skin may be helpful.  Corticosteroid injections may be given by your caregiver. These injections should be reserved for the most serious cases, because they may only be given a certain number of times. HEAT AND COLD  Cold treatment (icing) relieves pain and reduces inflammation. Cold treatment should be applied for 10 to 15 minutes every 2 to 3 hours for inflammation and pain and immediately after any activity that aggravates your symptoms. Use ice packs or massage the area with a piece of ice (ice massage).  Heat treatment may be used prior to performing the stretching and strengthening activities prescribed by your caregiver, physical therapist, or athletic trainer. Use a heat pack or soak the injury in warm water. SEEK   MEDICAL CARE IF:  Treatment seems to offer no benefit, or the condition  worsens.  Any medications produce adverse side effects. EXERCISES  RANGE OF MOTION (ROM) AND STRETCHING EXERCISES - Sciatica Most people with sciatic will find that their symptoms worsen with either excessive bending forward (flexion) or arching at the low back (extension). The exercises which will help resolve your symptoms will focus on the opposite motion. Your physician, physical therapist or athletic trainer will help you determine which exercises will be most helpful to resolve your low back pain. Do not complete any exercises without first consulting with your clinician. Discontinue any exercises which worsen your symptoms until you speak to your clinician. If you have pain, numbness or tingling which travels down into your buttocks, leg or foot, the goal of the therapy is for these symptoms to move closer to your back and eventually resolve. Occasionally, these leg symptoms will get better, but your low back pain may worsen; this is typically an indication of progress in your rehabilitation. Be certain to be very alert to any changes in your symptoms and the activities in which you participated in the 24 hours prior to the change. Sharing this information with your clinician will allow him/her to most efficiently treat your condition. These exercises may help you when beginning to rehabilitate your injury. Your symptoms may resolve with or without further involvement from your physician, physical therapist or athletic trainer. While completing these exercises, remember:   Restoring tissue flexibility helps normal motion to return to the joints. This allows healthier, less painful movement and activity.  An effective stretch should be held for at least 30 seconds.  A stretch should never be painful. You should only feel a gentle lengthening or release in the stretched tissue. FLEXION RANGE OF MOTION AND STRETCHING EXERCISES: STRETCH  Flexion, Single Knee to Chest   Lie on a firm bed or floor  with both legs extended in front of you.  Keeping one leg in contact with the floor, bring your opposite knee to your chest. Hold your leg in place by either grabbing behind your thigh or at your knee.  Pull until you feel a gentle stretch in your low back. Hold __________ seconds.  Slowly release your grasp and repeat the exercise with the opposite side. Repeat __________ times. Complete this exercise __________ times per day.  STRETCH  Flexion, Double Knee to Chest  Lie on a firm bed or floor with both legs extended in front of you.  Keeping one leg in contact with the floor, bring your opposite knee to your chest.  Tense your stomach muscles to support your back and then lift your other knee to your chest. Hold your legs in place by either grabbing behind your thighs or at your knees.  Pull both knees toward your chest until you feel a gentle stretch in your low back. Hold __________ seconds.  Tense your stomach muscles and slowly return one leg at a time to the floor. Repeat __________ times. Complete this exercise __________ times per day.  STRETCH  Low Trunk Rotation   Lie on a firm bed or floor. Keeping your legs in front of you, bend your knees so they are both pointed toward the ceiling and your feet are flat on the floor.  Extend your arms out to the side. This will stabilize your upper body by keeping your shoulders in contact with the floor.  Gently and slowly drop both knees together to one side until you   feel a gentle stretch in your low back. Hold for __________ seconds.  Tense your stomach muscles to support your low back as you bring your knees back to the starting position. Repeat the exercise to the other side. Repeat __________ times. Complete this exercise __________ times per day  EXTENSION RANGE OF MOTION AND FLEXIBILITY EXERCISES: STRETCH  Extension, Prone on Elbows  Lie on your stomach on the floor, a bed will be too soft. Place your palms about shoulder  width apart and at the height of your head.  Place your elbows under your shoulders. If this is too painful, stack pillows under your chest.  Allow your body to relax so that your hips drop lower and make contact more completely with the floor.  Hold this position for __________ seconds.  Slowly return to lying flat on the floor. Repeat __________ times. Complete this exercise __________ times per day.  RANGE OF MOTION  Extension, Prone Press Ups  Lie on your stomach on the floor, a bed will be too soft. Place your palms about shoulder width apart and at the height of your head.  Keeping your back as relaxed as possible, slowly straighten your elbows while keeping your hips on the floor. You may adjust the placement of your hands to maximize your comfort. As you gain motion, your hands will come more underneath your shoulders.  Hold this position __________ seconds.  Slowly return to lying flat on the floor. Repeat __________ times. Complete this exercise __________ times per day.  STRENGTHENING EXERCISES - Sciatica  These exercises may help you when beginning to rehabilitate your injury. These exercises should be done near your "sweet spot." This is the neutral, low-back arch, somewhere between fully rounded and fully arched, that is your least painful position. When performed in this safe range of motion, these exercises can be used for people who have either a flexion or extension based injury. These exercises may resolve your symptoms with or without further involvement from your physician, physical therapist or athletic trainer. While completing these exercises, remember:   Muscles can gain both the endurance and the strength needed for everyday activities through controlled exercises.  Complete these exercises as instructed by your physician, physical therapist or athletic trainer. Progress with the resistance and repetition exercises only as your caregiver advises.  You may  experience muscle soreness or fatigue, but the pain or discomfort you are trying to eliminate should never worsen during these exercises. If this pain does worsen, stop and make certain you are following the directions exactly. If the pain is still present after adjustments, discontinue the exercise until you can discuss the trouble with your clinician. STRENGTHENING Deep Abdominals, Pelvic Tilt   Lie on a firm bed or floor. Keeping your legs in front of you, bend your knees so they are both pointed toward the ceiling and your feet are flat on the floor.  Tense your lower abdominal muscles to press your low back into the floor. This motion will rotate your pelvis so that your tail bone is scooping upwards rather than pointing at your feet or into the floor.  With a gentle tension and even breathing, hold this position for __________ seconds. Repeat __________ times. Complete this exercise __________ times per day.  STRENGTHENING  Abdominals, Crunches   Lie on a firm bed or floor. Keeping your legs in front of you, bend your knees so they are both pointed toward the ceiling and your feet are flat on the floor. Cross   your arms over your chest.  Slightly tip your chin down without bending your neck.  Tense your abdominals and slowly lift your trunk high enough to just clear your shoulder blades. Lifting higher can put excessive stress on the low back and does not further strengthen your abdominal muscles.  Control your return to the starting position. Repeat __________ times. Complete this exercise __________ times per day.  STRENGTHENING  Quadruped, Opposite UE/LE Lift  Assume a hands and knees position on a firm surface. Keep your hands under your shoulders and your knees under your hips. You may place padding under your knees for comfort.  Find your neutral spine and gently tense your abdominal muscles so that you can maintain this position. Your shoulders and hips should form a rectangle that  is parallel with the floor and is not twisted.  Keeping your trunk steady, lift your right hand no higher than your shoulder and then your left leg no higher than your hip. Make sure you are not holding your breath. Hold this position __________ seconds.  Continuing to keep your abdominal muscles tense and your back steady, slowly return to your starting position. Repeat with the opposite arm and leg. Repeat __________ times. Complete this exercise __________ times per day.  STRENGTHENING  Abdominals and Quadriceps, Straight Leg Raise   Lie on a firm bed or floor with both legs extended in front of you.  Keeping one leg in contact with the floor, bend the other knee so that your foot can rest flat on the floor.  Find your neutral spine, and tense your abdominal muscles to maintain your spinal position throughout the exercise.  Slowly lift your straight leg off the floor about 6 inches for a count of 15, making sure to not hold your breath.  Still keeping your neutral spine, slowly lower your leg all the way to the floor. Repeat this exercise with each leg __________ times. Complete this exercise __________ times per day. POSTURE AND BODY MECHANICS CONSIDERATIONS - Sciatica Keeping correct posture when sitting, standing or completing your activities will reduce the stress put on different body tissues, allowing injured tissues a chance to heal and limiting painful experiences. The following are general guidelines for improved posture. Your physician or physical therapist will provide you with any instructions specific to your needs. While reading these guidelines, remember:  The exercises prescribed by your provider will help you have the flexibility and strength to maintain correct postures.  The correct posture provides the optimal environment for your joints to work. All of your joints have less wear and tear when properly supported by a spine with good posture. This means you will  experience a healthier, less painful body.  Correct posture must be practiced with all of your activities, especially prolonged sitting and standing. Correct posture is as important when doing repetitive low-stress activities (typing) as it is when doing a single heavy-load activity (lifting). RESTING POSITIONS Consider which positions are most painful for you when choosing a resting position. If you have pain with flexion-based activities (sitting, bending, stooping, squatting), choose a position that allows you to rest in a less flexed posture. You would want to avoid curling into a fetal position on your side. If your pain worsens with extension-based activities (prolonged standing, working overhead), avoid resting in an extended position such as sleeping on your stomach. Most people will find more comfort when they rest with their spine in a more neutral position, neither too rounded nor too arched. Lying   on a non-sagging bed on your side with a pillow between your knees, or on your back with a pillow under your knees will often provide some relief. Keep in mind, being in any one position for a prolonged period of time, no matter how correct your posture, can still lead to stiffness. PROPER SITTING POSTURE In order to minimize stress and discomfort on your spine, you must sit with correct posture Sitting with good posture should be effortless for a healthy body. Returning to good posture is a gradual process. Many people can work toward this most comfortably by using various supports until they have the flexibility and strength to maintain this posture on their own. When sitting with proper posture, your ears will fall over your shoulders and your shoulders will fall over your hips. You should use the back of the chair to support your upper back. Your low back will be in a neutral position, just slightly arched. You may place a small pillow or folded towel at the base of your low back for support.  When  working at a desk, create an environment that supports good, upright posture. Without extra support, muscles fatigue and lead to excessive strain on joints and other tissues. Keep these recommendations in mind: CHAIR:   A chair should be able to slide under your desk when your back makes contact with the back of the chair. This allows you to work closely.  The chair's height should allow your eyes to be level with the upper part of your monitor and your hands to be slightly lower than your elbows. BODY POSITION  Your feet should make contact with the floor. If this is not possible, use a foot rest.  Keep your ears over your shoulders. This will reduce stress on your neck and low back. INCORRECT SITTING POSTURES   If you are feeling tired and unable to assume a healthy sitting posture, do not slouch or slump. This puts excessive strain on your back tissues, causing more damage and pain. Healthier options include:  Using more support, like a lumbar pillow.  Switching tasks to something that requires you to be upright or walking.  Talking a brief walk.  Lying down to rest in a neutral-spine position. PROLONGED STANDING WHILE SLIGHTLY LEANING FORWARD  When completing a task that requires you to lean forward while standing in one place for a long time, place either foot up on a stationary 2-4 inch high object to help maintain the best posture. When both feet are on the ground, the low back tends to lose its slight inward curve. If this curve flattens (or becomes too large), then the back and your other joints will experience too much stress, fatigue more quickly and can cause pain.  CORRECT STANDING POSTURES Proper standing posture should be assumed with all daily activities, even if they only take a few moments, like when brushing your teeth. As in sitting, your ears should fall over your shoulders and your shoulders should fall over your hips. You should keep a slight tension in your abdominal  muscles to brace your spine. Your tailbone should point down to the ground, not behind your body, resulting in an over-extended swayback posture.  INCORRECT STANDING POSTURES  Common incorrect standing postures include a forward head, locked knees and/or an excessive swayback. WALKING Walk with an upright posture. Your ears, shoulders and hips should all line-up. PROLONGED ACTIVITY IN A FLEXED POSITION When completing a task that requires you to bend forward at your   waist or lean over a low surface, try to find a way to stabilize 3 of 4 of your limbs. You can place a hand or elbow on your thigh or rest a knee on the surface you are reaching across. This will provide you more stability so that your muscles do not fatigue as quickly. By keeping your knees relaxed, or slightly bent, you will also reduce stress across your low back. CORRECT LIFTING TECHNIQUES DO :   Assume a wide stance. This will provide you more stability and the opportunity to get as close as possible to the object which you are lifting.  Tense your abdominals to brace your spine; then bend at the knees and hips. Keeping your back locked in a neutral-spine position, lift using your leg muscles. Lift with your legs, keeping your back straight.  Test the weight of unknown objects before attempting to lift them.  Try to keep your elbows locked down at your sides in order get the best strength from your shoulders when carrying an object.  Always ask for help when lifting heavy or awkward objects. INCORRECT LIFTING TECHNIQUES DO NOT:   Lock your knees when lifting, even if it is a small object.  Bend and twist. Pivot at your feet or move your feet when needing to change directions.  Assume that you cannot safely pick up a paperclip without proper posture. Document Released: 01/23/2005 Document Revised: 04/17/2011 Document Reviewed: 05/07/2008 [facility] Patient Information 2014 [place], [place]. Hip Pain The hips join the upper  legs to the lower pelvis. The bones, cartilage, tendons, and muscles of the hip joint perform a lot of work each day holding your body weight and allowing you to move around. Hip pain is a common symptom. It can range from a minor ache to severe pain on 1 or both hips. Pain may be felt on the inside of the hip joint near the groin, or the outside near the buttocks and upper thigh. There may be swelling or stiffness as well. It occurs more often when a person walks or performs activity. There are many reasons hip pain can develop. CAUSES  It is important to work with your caregiver to identify the cause since many conditions can impact the bones, cartilage, muscles, and tendons of the hips. Causes for hip pain include:  Broken (fractured) bones.  Separation of the thighbone from the hip socket (dislocation).  Torn cartilage of the hip joint.  Swelling (inflammation) of a tendon (tendonitis), the sac within the hip joint (bursitis), or a joint.  A weakening in the abdominal wall (hernia), affecting the nerves to the hip.  Arthritis in the hip joint or lining of the hip joint.  Pinched nerves in the back, hip, or upper thigh.  A bulging disc in the spine (herniated disc).  Rarely, bone infection or cancer. DIAGNOSIS  The location of your hip pain will help your caregiver understand what may be causing the pain. A diagnosis is based on your medical history, your symptoms, results from your physical exam, and results from diagnostic tests. Diagnostic tests may include X-ray exams, a computerized magnetic scan (magnetic resonance imaging, MRI), or bone scan. TREATMENT  Treatment will depend on the cause of your hip pain. Treatment may include:  Limiting activities and resting until symptoms improve.  Crutches or other walking supports (a cane or brace).  Ice, elevation, and compression.  Physical therapy or home exercises.  Shoe inserts or special shoes.  Losing weight.  Medications  to reduce  pain.  Undergoing surgery. HOME CARE INSTRUCTIONS   Only take over-the-counter or prescription medicines for pain, discomfort, or fever as directed by your caregiver.  Put ice on the injured area:  Put ice in a plastic bag.  Place a towel between your skin and the bag.  Leave the ice on for 15-20 minutes at a time, 03-04 times a day.  Keep your leg raised (elevated) when possible to lessen swelling.  Avoid activities that cause pain.  Follow specific exercises as directed by your caregiver.  Sleep with a pillow between your legs on your most comfortable side.  Record how often you have hip pain, the location of the pain, and what it feels like. This information may be helpful to you and your caregiver.  Ask your caregiver about returning to work or sports and whether you should drive.  Follow up with your caregiver for further exams, therapy, or testing as directed. SEEK MEDICAL CARE IF:   Your pain or swelling continues or worsens after 1 week.  You are feeling unwell or have chills.  You have increasing difficulty with walking.  You have a loss of sensation or other new symptoms.  You have questions or concerns. SEEK IMMEDIATE MEDICAL CARE IF:   You cannot put weight on the affected hip.  You have fallen.  You have a sudden increase in pain and swelling in your hip.  You have a fever. MAKE SURE YOU:   Understand these instructions.  Will watch your condition.  Will get help right away if you are not doing well or get worse. Document Released: 07/13/2009 Document Revised: 04/17/2011 Document Reviewed: 07/13/2009 [facility] Patient Information 2014 [place], [place]. Cholesterol Cholesterol is a type of fat. Your body needs a small amount of cholesterol, but too much can cause health problems. Certain problems include heart attacks, strokes, and not enough blood flow to your heart, brain, kidneys, or feet. You get cholesterol in 2  ways:  Naturally.  By eating certain foods. HOME CARE  Eat a low-fat diet:  Eat less eggs, whole dairy products (whole milk, cheese, and butter), fatty meats, and fried foods.  Eat more fruits, vegetables, whole-wheat breads, lean chicken, and fish.  Follow your exercise program as told by your doctor.  Keep your weight at a healthy level. Talk to your doctor about what is right for you.  Only take medicine as told by your doctor.  Get your cholesterol checked once a year or as told by your doctor. MAKE SURE YOU:  Understand these instructions.  Will watch your condition.  Will get help right away if you are not doing well or get worse. Document Released: 04/21/2008 Document Revised: 04/17/2011 Document Reviewed: 04/21/2008 [facility] Patient Information 2014 [place], [place].

## 2012-12-24 NOTE — Progress Notes (Signed)
Subjective:    Patient ID: Raymond Patrick, male    DOB: 02/18/1935, 77 y.o.   MRN: [id number]  HPI Comments: 77 yo male presents for 3 month F/U for HTN, Cholesterol, D. Deficient. He notes he is feeling well overall. He exercises regularly and thinks he may have strained left hip on elliptical. He notes rest has improved hip symptoms and is slowly increasing activity level. He denies SOB with exercise but notes with extreme exertion mild SOB but recovers quickly. He is trying to improve diet and lose wt. He did have prostate removal 11/13 and has done well since surgery. LAST LABS T 163 TG 144 H39 L 95 D 70   Hypertension  Hyperlipidemia Associated symptoms include myalgias.  Benign Prostatic Hypertrophy    Current Outpatient Prescriptions on File Prior to Visit  Medication Sig Dispense Refill  . aspirin 81 MG tablet Take 81 mg by mouth daily.      . Cholecalciferol (VITAMIN D) 2000 UNITS CAPS Take 4,000 Units by mouth daily.      . Multiple Vitamin (MULTIVITAMIN WITH MINERALS) TABS Take 1 tablet by mouth every evening.       . Omega-3 Fatty Acids (FISH OIL) 1000 MG CAPS Take 2 capsules by mouth every evening.      . pravastatin (PRAVACHOL) 40 MG tablet Take 40 mg by mouth at bedtime.      Marland Kitchen terazosin (HYTRIN) 10 MG capsule Take 10 mg by mouth daily.      . cetirizine (ZYRTEC) 10 MG tablet Take 10 mg by mouth daily.       No current facility-administered medications on file prior to visit.   ALLERGIES Nitrofuran derivatives and Macrobid   Past Medical History  Diagnosis Date  . BPH (benign prostatic hypertrophy)   . Hyperlipidemia   . COPD (chronic obstructive pulmonary disease)   . History of kidney stones   . Bladder tumor   . Impaired hearing bilateral aids  . History of lung cancer 2008  S/P RIGHT UPPER LOPECTOMY     STAGE I  NON-SMALL CELL LUNG CANCER--  ONCOLOGIST- DR MOHAMED-   NO RECURRENCE  . Hypertension   . Unspecified vitamin D deficiency   . Cancer    bladder, prostate, lung     Review of Systems  Musculoskeletal: Positive for myalgias.  All other systems reviewed and are negative.    BP 106/62  Pulse 74  Temp(Src) 98 F (36.7 C) (Temporal)  Resp 18  Ht 5' 4.5" (1.638 m)  Wt 185 lb (83.915 kg)  BMI 31.28 kg/m2     Objective:   Physical Exam  Nursing note and vitals reviewed. Constitutional: He is oriented to person, place, and time. He appears well-developed and well-nourished.  HENT:  Head: Normocephalic and atraumatic.  Eyes: Pupils are equal, round, and reactive to light.  Neck: Normal range of motion. No thyromegaly present.  Cardiovascular: Normal rate, normal heart sounds and intact distal pulses.   Occasional irreg   Pulmonary/Chest: Effort normal and breath sounds normal.  Abdominal: Soft.  Musculoskeletal: Normal range of motion. He exhibits tenderness.  MINIMAL TENDERNESS WITH ROM WITH LEFT SI AREA  Lymphadenopathy:    He has no cervical adenopathy.  Neurological: He is alert and oriented to person, place, and time. He has normal reflexes. No cranial nerve deficit.  Skin: Skin is warm and dry.  Psychiatric: He has a normal mood and affect. Judgment normal.          Assessment &  Plan:  1. 3 month F/U for HTN, Cholesterol, D. Deficient check labs 2. Probable hip strain vs sciatica, stretches explained w/c if increased symptoms for Pred

## 2012-12-25 ENCOUNTER — Ambulatory Visit: Payer: Self-pay | Admitting: Emergency Medicine

## 2013-01-06 DIAGNOSIS — Z8551 Personal history of malignant neoplasm of bladder: Secondary | ICD-10-CM | POA: Diagnosis not present

## 2013-02-26 DIAGNOSIS — H2510 Age-related nuclear cataract, unspecified eye: Secondary | ICD-10-CM | POA: Diagnosis not present

## 2013-03-25 DIAGNOSIS — R7309 Other abnormal glucose: Secondary | ICD-10-CM | POA: Insufficient documentation

## 2013-03-25 NOTE — Progress Notes (Signed)
Patient ID: Raymond Patrick, male   DOB: 1935-10-27, 78 y.o.   MRN: [id number]    This very nice 78 y.o. SWM presents for 3 month follow up with Hypertension, Hyperlipidemia, Hx/o Lung Cancer(2008), Pre-Diabetes and Vitamin D Deficiency.    HTN predates since 1998. BP has been controlled at home. Today's BP: 126/70 mmHg . Patient denies any cardiac type chest pain, palpitations, dyspnea/orthopnea/PND, dizziness, claudication, or dependent edema.   Hyperlipidemia is controlled with diet & meds.  Lab Results  Component Value Date   CHOL 161 12/24/2012   HDL 42 12/24/2012   LDLCALC 86 12/24/2012   TRIG 167* 12/24/2012   CHOLHDL 3.8 12/24/2012         Patient denies myalgias or other med SE's.    Also, the patient has history of PreDiabetes with A1c 6.2% in 2011 and normal since with last A1c of 5.5% in Aug 2014. Patient denies any symptoms of reactive hypoglycemia, diabetic polys, paresthesias or visual blurring.   Further, Patient has history of Vitamin D Deficiency of 30 in 2008 with last vitamin D of 70 in Aug. Patient supplements vitamin D without any suspected side-effects.    Medication List     aspirin 81 MG tablet  Take 81 mg by mouth daily.     cetirizine 10 MG tablet  Commonly known as:  ZYRTEC  Take 10 mg by mouth daily.     Fish Oil 1000 MG Caps  Take 2 capsules by mouth every evening.     HYDROcodone-acetaminophen 5-325 MG per tablet  Commonly known as:  NORCO  1/2 to 1 tablet every 3 to 4 hour as needed for cough or pain     levofloxacin 500 MG tablet  Commonly known as:  LEVAQUIN  Take 1 tablet (500 mg total) by mouth daily.     multivitamin with minerals Tabs tablet  Take 1 tablet by mouth every evening.     pravastatin 40 MG tablet  Commonly known as:  PRAVACHOL  Take 40 mg by mouth at bedtime.     predniSONE 20 MG tablet  Commonly known as:  DELTASONE  Take 1 tablet (20 mg total) by mouth See admin instructions. 1 tab 3 x day for 3 days, then 1 tab 2  x day for 3 days, then 1 tab 1 x day for 5 days     terazosin 10 MG capsule  Commonly known as:  HYTRIN  Take 10 mg by mouth daily.     Vitamin D 2000 UNITS Caps  Take 4,000 Units by mouth daily.        Allergies  Allergen Reactions  . Nitrofuran Derivatives Shortness Of Breath  . Macrobid [Nitrofurantoin Macrocrystal]     Headache    PMHx:   Past Medical History  Diagnosis Date  . BPH (benign prostatic hypertrophy)   . Hyperlipidemia   . COPD (chronic obstructive pulmonary disease)   . History of kidney stones   . Bladder tumor   . Impaired hearing bilateral aids  . History of lung cancer 2008  S/P RIGHT UPPER LOPECTOMY     STAGE I  NON-SMALL CELL LUNG CANCER--  ONCOLOGIST- DR MOHAMED-   NO RECURRENCE  . Hypertension   . Unspecified vitamin D deficiency   . Cancer     bladder, prostate, lung    FHx:    Reviewed / unchanged  SHx:    Reviewed / unchanged  Systems Review: Constitutional: Denies fever, chills, wt changes, headaches, insomnia, fatigue,  night sweats, change in appetite. Eyes: Denies redness, blurred vision, diplopia, discharge, itchy, watery eyes.  ENT: Denies discharge, congestion, post nasal drip, epistaxis, sore throat, earache, hearing loss, dental pain, tinnitus, vertigo, sinus pain, snoring.  CV: Denies chest pain, palpitations, irregular heartbeat, syncope, dyspnea, diaphoresis, orthopnea, PND, claudication, edema. Respiratory: c/o head and chest congestion and productive cough. Gastrointestinal: Denies dysphagia, odynophagia, heartburn, reflux, water brash, abdominal pain or cramps, nausea, vomiting, bloating, diarrhea, constipation, hematemesis, melena, hematochezia,  or hemorrhoids. Genitourinary: Denies dysuria, frequency, urgency, nocturia, hesitancy, discharge, hematuria, flank pain. Musculoskeletal: Denies arthralgias, myalgias, stiffness, jt. swelling, pain, limp, strain/sprain.  Skin: Denies pruritus, rash, hives, warts, acne, eczema, change  in skin lesion(s). Neuro: No weakness, tremor, incoordination, spasms, paresthesia, or pain. Psychiatric: Denies confusion, memory loss, or sensory loss. Endo: Denies change in weight, skin, hair change.  Heme/Lymph: No excessive bleeding, bruising, orenlarged lymph nodes.  BP: 126/70  Pulse: 88  Temp: 97.7 F (36.5 C)  Resp: 18   Estimated body mass index is 31.38 kg/(m^2) as calculated from the following:   Height as of 12/24/12: 5' 4.5" (1.638 m).   Weight as of this encounter: 185 lb 9.6 oz (84.188 kg).  On Exam: Appears well nourished - in no distress. Eyes: PERRLA, EOMs, conjunctiva no swelling or erythema. Sinuses: No frontal/maxillary tenderness ENT/Mouth: EAC's clear, TM's nl w/o erythema, bulging. Nares clear w/o erythema, swelling, exudates. Oropharynx clear without erythema or exudates. Oral hygiene is good. Tongue normal, non obstructing. Hearing intact.  Neck: Supple. Thyroid nl. Car 2+/2+ without bruits, nodes or JVD. Chest: Respirations nl with BS scattered rales, rhonchi, but no wheezing or stridor.  Cor: Heart sounds normal w/ regular rate and rhythm without sig. murmurs, gallops, clicks, or rubs. Peripheral pulses normal and equal  without edema.  Abdomen: Soft & bowel sounds normal. Non-tender w/o guarding, rebound, hernias, masses, or organomegaly.  Lymphatics: Unremarkable.  Musculoskeletal: Full ROM all peripheral extremities, joint stability, 5/5 strength, and normal gait.  Skin: Warm, dry without exposed rashes, lesions, ecchymosis apparent.  Neuro: Cranial nerves intact, reflexes equal bilaterally. Sensory-motor testing grossly intact. Tendon reflexes grossly intact.  Pysch: Alert & oriented x 3. Insight and judgement nl & appropriate. No ideations.  Assessment and Plan:  1. Hypertension - Continue monitor blood pressure at home. Continue diet/meds same.  2. Hyperlipidemia - Continue diet/meds, exercise,& lifestyle modifications. Continue monitor periodic  cholesterol/liver & renal functions   3. Pre-diabetes/Insulin Resistance - Continue diet, exercise, lifestyle modifications. Monitor appropriate labs.  3. Diabetes - continue recommend prudent low glycemic diet, weight control, regular exercise, diabetic monitoring and periodic eye exams.  4. Vitamin D Deficiency - Continue supplementation.  5- Tracheobronchitis - Rx'd Z-Pak - prednisone pulse-taper and Norco for cough  Recommended regular exercise, BP monitoring, weight control, and discussed med and SE's. Recommended labs to assess and monitor clinical status. Further disposition pending results of labs.

## 2013-03-25 NOTE — Patient Instructions (Signed)

## 2013-03-26 ENCOUNTER — Ambulatory Visit (INDEPENDENT_AMBULATORY_CARE_PROVIDER_SITE_OTHER): Payer: Medicare Other | Admitting: Internal Medicine

## 2013-03-26 ENCOUNTER — Encounter: Payer: Self-pay | Admitting: Internal Medicine

## 2013-03-26 VITALS — BP 126/70 | HR 88 | Temp 97.7°F | Resp 18 | Wt 185.6 lb

## 2013-03-26 DIAGNOSIS — R7309 Other abnormal glucose: Secondary | ICD-10-CM | POA: Diagnosis not present

## 2013-03-26 DIAGNOSIS — E782 Mixed hyperlipidemia: Secondary | ICD-10-CM

## 2013-03-26 DIAGNOSIS — Z79899 Other long term (current) drug therapy: Secondary | ICD-10-CM | POA: Diagnosis not present

## 2013-03-26 DIAGNOSIS — E785 Hyperlipidemia, unspecified: Secondary | ICD-10-CM | POA: Diagnosis not present

## 2013-03-26 DIAGNOSIS — E559 Vitamin D deficiency, unspecified: Secondary | ICD-10-CM

## 2013-03-26 DIAGNOSIS — I10 Essential (primary) hypertension: Secondary | ICD-10-CM | POA: Diagnosis not present

## 2013-03-26 DIAGNOSIS — R7303 Prediabetes: Secondary | ICD-10-CM

## 2013-03-26 LAB — CBC WITH DIFFERENTIAL/PLATELET
Basophils Absolute: 0 K/uL (ref 0.0–0.1)
Basophils Relative: 0 % (ref 0–1)
Eosinophils Absolute: 0.2 K/uL (ref 0.0–0.7)
Eosinophils Relative: 2 % (ref 0–5)
HCT: 40.6 % (ref 39.0–52.0)
Hemoglobin: 13.6 g/dL (ref 13.0–17.0)
Lymphocytes Relative: 27 % (ref 12–46)
Lymphs Abs: 2.6 K/uL (ref 0.7–4.0)
MCH: 31.2 pg (ref 26.0–34.0)
MCHC: 33.5 g/dL (ref 30.0–36.0)
MCV: 93.1 fL (ref 78.0–100.0)
Monocytes Absolute: 0.7 K/uL (ref 0.1–1.0)
Monocytes Relative: 7 % (ref 3–12)
Neutro Abs: 6.1 K/uL (ref 1.7–7.7)
Neutrophils Relative %: 64 % (ref 43–77)
Platelets: 250 K/uL (ref 150–400)
RBC: 4.36 MIL/uL (ref 4.22–5.81)
RDW: 13.1 % (ref 11.5–15.5)
WBC: 9.5 K/uL (ref 4.0–10.5)

## 2013-03-26 LAB — LIPID PANEL
CHOL/HDL RATIO: 4.6 ratio
CHOLESTEROL: 133 mg/dL (ref 0–200)
HDL: 29 mg/dL — ABNORMAL LOW (ref >39)
LDL Cholesterol: 53 mg/dL (ref 0–99)
Triglycerides: 255 mg/dL — ABNORMAL HIGH (ref <150)
VLDL: 51 mg/dL — AB (ref 0–40)

## 2013-03-26 LAB — BASIC METABOLIC PANEL WITH GFR
BUN: 11 mg/dL (ref 6–23)
CALCIUM: 9.3 mg/dL (ref 8.4–10.5)
CHLORIDE: 104 meq/L (ref 96–112)
CO2: 28 meq/L (ref 19–32)
CREATININE: 0.93 mg/dL (ref 0.50–1.35)
GFR, Est African American: >89 mL/min
GFR, Est Non African American: 80 mL/min
GLUCOSE: 120 mg/dL — AB (ref 70–99)
Potassium: 4.2 mEq/L (ref 3.5–5.3)
Sodium: 141 mEq/L (ref 135–145)

## 2013-03-26 LAB — HEPATIC FUNCTION PANEL
ALBUMIN: 4.2 g/dL (ref 3.5–5.2)
ALK PHOS: 79 U/L (ref 39–117)
ALT: 20 U/L (ref 0–53)
AST: 22 U/L (ref 0–37)
BILIRUBIN TOTAL: 0.4 mg/dL (ref 0.2–1.2)
Bilirubin, Direct: 0.1 mg/dL (ref 0.0–0.3)
Indirect Bilirubin: 0.3 mg/dL (ref 0.2–1.2)
Total Protein: 6.6 g/dL (ref 6.0–8.3)

## 2013-03-26 LAB — MAGNESIUM: Magnesium: 2.3 mg/dL (ref 1.5–2.5)

## 2013-03-26 LAB — TSH: TSH: 1 u[IU]/mL (ref 0.350–4.500)

## 2013-03-26 MED ORDER — LEVOFLOXACIN 500 MG PO TABS: 500.0000 mg | ORAL_TABLET | Freq: Every day | ORAL | Status: AC

## 2013-03-26 MED ORDER — PREDNISONE 20 MG PO TABS: 20.0000 mg | ORAL_TABLET | ORAL | Status: DC

## 2013-03-26 MED ORDER — HYDROCODONE-ACETAMINOPHEN 5-325 MG PO TABS: ORAL_TABLET | ORAL | Status: DC

## 2013-03-27 LAB — HEMOGLOBIN A1C
Hgb A1c MFr Bld: 5.7 % — ABNORMAL HIGH (ref <5.7)
MEAN PLASMA GLUCOSE: 117 mg/dL — AB (ref <117)

## 2013-03-27 LAB — INSULIN, FASTING: INSULIN FASTING, SERUM: 67 u[IU]/mL — AB (ref 3–28)

## 2013-03-27 LAB — VITAMIN D 25 HYDROXY (VIT D DEFICIENCY, FRACTURES): Vit D, 25-Hydroxy: 75 ng/mL (ref 30–89)

## 2013-05-20 ENCOUNTER — Encounter (HOSPITAL_COMMUNITY): Payer: Self-pay | Admitting: Emergency Medicine

## 2013-05-20 ENCOUNTER — Emergency Department (INDEPENDENT_AMBULATORY_CARE_PROVIDER_SITE_OTHER)
Admission: EM | Admit: 2013-05-20 | Discharge: 2013-05-20 | Disposition: A | Discharge status: Home / Self Care | Payer: Medicare Other | Source: Home / Self Care | Attending: Family Medicine | Admitting: Family Medicine

## 2013-05-20 DIAGNOSIS — J302 Other seasonal allergic rhinitis: Secondary | ICD-10-CM

## 2013-05-20 DIAGNOSIS — S39012A Strain of muscle, fascia and tendon of lower back, initial encounter: Secondary | ICD-10-CM

## 2013-05-20 DIAGNOSIS — S335XXA Sprain of ligaments of lumbar spine, initial encounter: Secondary | ICD-10-CM | POA: Diagnosis not present

## 2013-05-20 DIAGNOSIS — J309 Allergic rhinitis, unspecified: Secondary | ICD-10-CM | POA: Diagnosis not present

## 2013-05-20 LAB — POCT URINALYSIS DIP (DEVICE)
Bilirubin Urine: NEGATIVE
Glucose, UA: NEGATIVE mg/dL
Leukocytes, UA: NEGATIVE
NITRITE: NEGATIVE
PH: 7 (ref 5.0–8.0)
Protein, ur: NEGATIVE mg/dL
Specific Gravity, Urine: 1.02 (ref 1.005–1.030)
UROBILINOGEN UA: 1 mg/dL (ref 0.0–1.0)

## 2013-05-20 MED ORDER — CETIRIZINE HCL 10 MG PO TABS: 10.0000 mg | ORAL_TABLET | Freq: Every day | ORAL | Status: DC

## 2013-05-20 MED ORDER — METHYLPREDNISOLONE ACETATE 80 MG/ML IJ SUSP
INTRAMUSCULAR | Status: AC
  Filled 2013-05-20: qty 1

## 2013-05-20 MED ORDER — METHYLPREDNISOLONE ACETATE 40 MG/ML IJ SUSP
80.0000 mg | Freq: Once | INTRAMUSCULAR | Status: AC
  Administered 2013-05-20: 80 mg via INTRAMUSCULAR

## 2013-05-20 MED ORDER — TRIAMCINOLONE ACETONIDE 40 MG/ML IJ SUSP
40.0000 mg | Freq: Once | INTRAMUSCULAR | Status: AC
  Administered 2013-05-20: 40 mg via INTRAMUSCULAR

## 2013-05-20 MED ORDER — IPRATROPIUM BROMIDE 0.06 % NA SOLN: 2.0000 | Freq: Four times a day (QID) | NASAL | Status: DC

## 2013-05-20 MED ORDER — TRIAMCINOLONE ACETONIDE 40 MG/ML IJ SUSP
INTRAMUSCULAR | Status: AC
  Filled 2013-05-20: qty 1

## 2013-05-20 NOTE — ED Provider Notes (Addendum)
CSN: [id number]     Arrival date & time 05/20/13  1441 History   None    Chief Complaint  Patient presents with  . Back Pain   (Consider location/radiation/quality/duration/timing/severity/associated sxs/prior Treatment) Patient is a 78 y.o. male presenting with back pain. The history is provided by the patient.  Back Pain Location:  Lumbar spine Quality:  Stiffness Stiffness is present:  In the morning Radiates to:  Does not radiate Onset quality:  Gradual Duration:  2 days Chronicity:  New Context: recent illness   Associated symptoms: no abdominal pain, no abdominal swelling, no bladder incontinence, no bowel incontinence, no dysuria, no fever, no numbness and no weakness     Past Medical History  Diagnosis Date  . BPH (benign prostatic hypertrophy)   . Hyperlipidemia   . COPD (chronic obstructive pulmonary disease)   . History of kidney stones   . Bladder tumor   . Impaired hearing bilateral aids  . History of lung cancer 2008  S/P RIGHT UPPER LOPECTOMY     STAGE I  NON-SMALL CELL LUNG CANCER--  ONCOLOGIST- DR MOHAMED-   NO RECURRENCE  . Hypertension   . Unspecified vitamin D deficiency   . Cancer     bladder, prostate, lung   Past Surgical History  Procedure Laterality Date  . Lung removal, partial  2008  . Lithotripsy  2012  . Fiberoptic bronchoscopy  10-28-2008  . Right upper lung lobectomy  09-21-2006  DR BURNEY    NON-SMALL CELL CANCER/ SEVERE COPD  . Transurethral resection of prostate  12/25/2011    Procedure: TRANSURETHRAL RESECTION OF THE PROSTATE WITH GYRUS INSTRUMENTS;  Surgeon: Franchot Gallo, MD;  Location: [facility];  Service: Urology;  Laterality: N/A;  2 HRS   . Transurethral resection of bladder tumor  12/25/2011    Procedure: TRANSURETHRAL RESECTION OF BLADDER TUMOR (TURBT);  Surgeon: Franchot Gallo, MD;  Location: [facility];  Service: Urology;  Laterality: N/A;  . Prostate surgery      prostatectomy    Family History  Problem Relation Age of Onset  . Cancer Mother     lymphoma  . Diabetes Mother   . Hypertension Mother   . Cancer Father     lung, colon  . Cancer Brother 64    lung  . Diabetes Maternal Grandmother   . Cancer Brother 72    lung   History  Substance Use Topics  . Smoking status: Former Smoker -- 1.00 packs/day for 35 years    Types: Cigarettes    Quit date: 02/07/2004  . Smokeless tobacco: Never Used  . Alcohol Use: Yes     Comment: socially    Review of Systems  Constitutional: Negative for fever.  HENT: Positive for congestion, postnasal drip, rhinorrhea and sneezing.   Respiratory: Positive for cough. Negative for wheezing.   Gastrointestinal: Negative for nausea, vomiting, abdominal pain, diarrhea and bowel incontinence.  Genitourinary: Negative for bladder incontinence, dysuria, urgency, frequency and hematuria.  Musculoskeletal: Positive for back pain.  Neurological: Negative for weakness and numbness.    Allergies  Nitrofuran derivatives and Macrobid  Home Medications   Prior to Admission medications   Medication Sig Start Date End Date Taking? Authorizing Provider  aspirin 81 MG tablet Take 81 mg by mouth daily.    Historical Provider, MD  cetirizine (ZYRTEC) 10 MG tablet Take 10 mg by mouth daily.    Historical Provider, MD  Cholecalciferol (VITAMIN D) 2000 UNITS CAPS Take 4,000 Units by  mouth daily.    Historical Provider, MD  HYDROcodone-acetaminophen (NORCO) 5-325 MG per tablet 1/2 to 1 tablet every 3 to 4 hour as needed for cough or pain 03/26/13   [name], MD  Multiple Vitamin (MULTIVITAMIN WITH MINERALS) TABS Take 1 tablet by mouth every evening.     Historical Provider, MD  Omega-3 Fatty Acids (FISH OIL) 1000 MG CAPS Take 2 capsules by mouth every evening.    Historical Provider, MD  pravastatin (PRAVACHOL) 40 MG tablet Take 40 mg by mouth at bedtime.    Historical Provider, MD  predniSONE (DELTASONE) 20 MG tablet Take 1  tablet (20 mg total) by mouth See admin instructions. 1 tab 3 x day for 3 days, then 1 tab 2 x day for 3 days, then 1 tab 1 x day for 5 days 03/26/13   [name], MD  terazosin (HYTRIN) 10 MG capsule Take 10 mg by mouth daily. 07/22/12   Historical Provider, MD   BP 142/75  Pulse 78  Temp(Src) 98.8 F (37.1 C) (Oral)  Resp 18  SpO2 95% Physical Exam  Nursing note and vitals reviewed. Constitutional: He is oriented to person, place, and time. He appears well-developed and well-nourished.  HENT:  Right Ear: External ear normal.  Left Ear: External ear normal.  Mouth/Throat: Oropharynx is clear and moist.  Eyes: Conjunctivae are normal. Pupils are equal, round, and reactive to light.  Neck: Normal range of motion. Neck supple.  Cardiovascular: Regular rhythm and normal heart sounds.   Pulmonary/Chest: Effort normal and breath sounds normal.  Abdominal: Soft. Bowel sounds are normal. There is no tenderness.  Musculoskeletal: He exhibits tenderness.       Lumbar back: He exhibits decreased range of motion, tenderness and pain. He exhibits no swelling, no deformity and normal pulse.       Back:  Lymphadenopathy:    He has no cervical adenopathy.  Neurological: He is alert and oriented to person, place, and time.  Skin: Skin is warm and dry.    ED Course  Procedures (including critical care time) Labs Review Labs Reviewed  POCT URINALYSIS DIP (DEVICE) - Abnormal; Notable for the following:    Ketones, ur TRACE (*)    Hgb urine dipstick TRACE (*)    All other components within normal limits    Results for orders placed during the hospital encounter of 05/20/13  POCT URINALYSIS DIP (DEVICE)      Result Value Ref Range   Glucose, UA NEGATIVE  NEGATIVE mg/dL   Bilirubin Urine NEGATIVE  NEGATIVE   Ketones, ur TRACE (*) NEGATIVE mg/dL   Specific Gravity, Urine 1.020  1.005 - 1.030   Hgb urine dipstick TRACE (*) NEGATIVE   pH 7.0  5.0 - 8.0   Protein, ur NEGATIVE  NEGATIVE  mg/dL   Urobilinogen, UA 1.0  0.0 - 1.0 mg/dL   Nitrite NEGATIVE  NEGATIVE   Leukocytes, UA NEGATIVE  NEGATIVE   Imaging Review No results found.   MDM   1. Seasonal allergic rhinitis   2. Low back strain        Billy Fischer, MD 05/20/13 Quail, MD 05/30/13 1016

## 2013-05-20 NOTE — ED Notes (Signed)
Pt  Has  Multiple  Symptoms  To  Include  Back  api  r   Side  With  Some  Weakness  As  Well  As    Fatigue  On  Exertion       The  Back pain started  Last  Pm   -  He  Has    Has  Symptoms of  Sinus  Congestion /  Drainage  And  Cough      For a  Few  Days

## 2013-05-27 ENCOUNTER — Ambulatory Visit (HOSPITAL_COMMUNITY)
Admission: RE | Admit: 2013-05-27 | Discharge: 2013-05-27 | Disposition: A | Discharge status: Home / Self Care | Payer: Medicare Other | Source: Ambulatory Visit | Attending: Physician Assistant | Admitting: Physician Assistant

## 2013-05-27 ENCOUNTER — Encounter: Payer: Self-pay | Admitting: Physician Assistant

## 2013-05-27 ENCOUNTER — Ambulatory Visit (INDEPENDENT_AMBULATORY_CARE_PROVIDER_SITE_OTHER): Payer: Medicare Other | Admitting: Physician Assistant

## 2013-05-27 VITALS — BP 100/62 | HR 88 | Temp 98.1°F | Resp 16 | Wt 180.0 lb

## 2013-05-27 DIAGNOSIS — J441 Chronic obstructive pulmonary disease with (acute) exacerbation: Secondary | ICD-10-CM

## 2013-05-27 DIAGNOSIS — M549 Dorsalgia, unspecified: Secondary | ICD-10-CM | POA: Diagnosis not present

## 2013-05-27 DIAGNOSIS — M4120 Other idiopathic scoliosis, site unspecified: Secondary | ICD-10-CM | POA: Insufficient documentation

## 2013-05-27 DIAGNOSIS — R0600 Dyspnea, unspecified: Secondary | ICD-10-CM

## 2013-05-27 DIAGNOSIS — R0602 Shortness of breath: Secondary | ICD-10-CM | POA: Diagnosis not present

## 2013-05-27 DIAGNOSIS — R0609 Other forms of dyspnea: Secondary | ICD-10-CM | POA: Diagnosis not present

## 2013-05-27 DIAGNOSIS — J4489 Other specified chronic obstructive pulmonary disease: Secondary | ICD-10-CM | POA: Insufficient documentation

## 2013-05-27 DIAGNOSIS — J449 Chronic obstructive pulmonary disease, unspecified: Secondary | ICD-10-CM | POA: Insufficient documentation

## 2013-05-27 DIAGNOSIS — R0989 Other specified symptoms and signs involving the circulatory and respiratory systems: Secondary | ICD-10-CM

## 2013-05-27 DIAGNOSIS — J438 Other emphysema: Secondary | ICD-10-CM | POA: Diagnosis not present

## 2013-05-27 MED ORDER — IPRATROPIUM-ALBUTEROL 0.5-2.5 (3) MG/3ML IN SOLN
3.0000 mL | Freq: Once | RESPIRATORY_TRACT | Status: AC
  Administered 2013-05-27: 3 mL via RESPIRATORY_TRACT

## 2013-05-27 MED ORDER — ALBUTEROL SULFATE HFA 108 (90 BASE) MCG/ACT IN AERS: 1.0000 | INHALATION_SPRAY | Freq: Four times a day (QID) | RESPIRATORY_TRACT | Status: DC | PRN

## 2013-05-27 MED ORDER — UMECLIDINIUM-VILANTEROL 62.5-25 MCG/INH IN AEPB: INHALATION_SPRAY | RESPIRATORY_TRACT | Status: DC

## 2013-05-27 MED ORDER — AZITHROMYCIN 250 MG PO TABS: ORAL_TABLET | ORAL | Status: DC

## 2013-05-27 MED ORDER — DEXAMETHASONE SODIUM PHOSPHATE 10 MG/ML IJ SOLN
10.0000 mg | Freq: Once | INTRAMUSCULAR | Status: AC
  Administered 2013-05-27: 10 mg via INTRAMUSCULAR

## 2013-05-27 NOTE — Progress Notes (Signed)
   Subjective:    Patient ID: Raymond Patrick, male    DOB: Oct 24, 1935, 78 y.o.   MRN: [id number]  HPI 78 y.o. with history of COPD, lung cancer presents with continuing chest congestion/SOB. He was treated for an infection in Feb with levaquin and states he felt better for 2-4 weeks but recently he has been having a cough with scant mucus production, if he does it is white, he states for the last 2 weeks he has been having SOB, with center back pain (feels like a fist punching him), and extreme fatigue with climbing stairs. He went to the ER 05/20/13 for lower back pain/allergies and was given medication and 2 shots but states he still does not feel well. They did not get an EKG or CXR at that time.  CO2 94%  Review of Systems  Constitutional: Positive for activity change and fatigue. Negative for fever, chills, diaphoresis, appetite change and unexpected weight change.  HENT: Negative.   Eyes: Negative.   Respiratory: Positive for cough and shortness of breath. Negative for apnea, choking, chest tightness, wheezing and stridor.   Cardiovascular: Negative for chest pain, palpitations and leg swelling.  Gastrointestinal: Negative.   Genitourinary: Negative.   Musculoskeletal: Positive for back pain (between in shoulders, worse with exertion and standing, better at rest). Negative for arthralgias, gait problem, joint swelling, myalgias and neck pain.  Skin: Negative.   Neurological: Negative.        Objective:   Physical Exam  Nursing note and vitals reviewed. Constitutional: He is oriented to person, place, and time. He appears well-developed and well-nourished.  HENT:  Head: Normocephalic and atraumatic.  Eyes: Pupils are equal, round, and reactive to light.  Neck: Normal range of motion. No thyromegaly present.  Cardiovascular: Normal rate and intact distal pulses.   Murmur (systolic ) heard. Occasional irreg   Pulmonary/Chest: Effort normal. He has decreased breath sounds.   Abdominal: Soft.  Musculoskeletal: Normal range of motion. He exhibits tenderness.  MINIMAL TENDERNESS WITH ROM WITH LEFT SI AREA  Lymphadenopathy:    He has no cervical adenopathy.  Neurological: He is alert and oriented to person, place, and time. He has normal reflexes. No cranial nerve deficit.  Skin: Skin is warm and dry.  Psychiatric: He has a normal mood and affect. Judgment normal.       Assessment & Plan:  SOB/Fatigue/back pain- COPD vs CAD vs infection/cancer- will get EKG r/o ACS, get CXR, treat COPD, zpak/samples of anoro ellipta, breathing treatment in the office EKG does not show any acute ST changes- patient is still at high risk for ACS- with risk factors, upper back pain, fatigue, SOB with exertion, will refer to cardio  He will go to the ER if any worseing SOB or any worsening upper back pain/chest pain  OVER 40 minutes of exam, counseling, chart review, referral performed

## 2013-05-27 NOTE — Patient Instructions (Signed)
Chronic Obstructive Pulmonary Disease  Chronic obstructive pulmonary disease (COPD) is a common lung condition in which airflow from the lungs is limited. COPD is a general term that can be used to describe many different lung problems that limit airflow, including both chronic bronchitis and emphysema.  If you have COPD, your lung function will probably never return to normal, but there are measures you can take to improve lung function and make yourself feel better.   CAUSES   · Smoking (common).    · Exposure to secondhand smoke.    · Genetic problems.  · Chronic inflammatory lung diseases or recurrent infections.  SYMPTOMS   · Shortness of breath, especially with physical activity.    · Deep, persistent (chronic) cough with a large amount of thick mucus.    · Wheezing.    · Rapid breaths (tachypnea).    · Gray or bluish discoloration (cyanosis) of the skin, especially in fingers, toes, or lips.    · Fatigue.    · Weight loss.    · Frequent infections or episodes when breathing symptoms become much worse (exacerbations).    · Chest tightness.  DIAGNOSIS   Your healthcare provider will take a medical history and perform a physical examination to make the initial diagnosis.  Additional tests for COPD may include:   · Lung (pulmonary) function tests.  · Chest X-ray.  · CT scan.  · Blood tests.  TREATMENT   Treatment available to help you feel better when you have COPD include:   · Inhaler and nebulizer medicines. These help manage the symptoms of COPD and make your breathing more comfortable  · Supplemental oxygen. Supplemental oxygen is only helpful if you have a low oxygen level in your blood.    · Exercise and physical activity. These are beneficial for nearly all people with COPD. Some people may also benefit from a pulmonary rehabilitation program.  HOME CARE INSTRUCTIONS   · Take all medicines (inhaled or pills) as directed by your health care provider.  · Only take over-the-counter or prescription medicines  for pain, fever, or discomfort as directed by your health care provider.    · Avoid over-the-counter medicines or cough syrups that dry up your airway (such as antihistamines) and slow down the elimination of secretions unless instructed otherwise by your healthcare provider.    · If you are a smoker, the most important thing that you can do is stop smoking. Continuing to smoke will cause further lung damage and breathing trouble. Ask your health care provider for help with quitting smoking. He or she can direct you to community resources or hospitals that provide support.  · Avoid exposure to irritants such as smoke, chemicals, and fumes that aggravate your breathing.  · Use oxygen therapy and pulmonary rehabilitation if directed by your health care provider. If you require home oxygen therapy, ask your healthcare provider whether you should purchase a pulse oximeter to measure your oxygen level at home.    · Avoid contact with individuals who have a contagious illness.  · Avoid extreme temperature and humidity changes.  · Eat healthy foods. Eating smaller, more frequent meals and resting before meals may help you maintain your strength.  · Stay active, but balance activity with periods of rest. Exercise and physical activity will help you maintain your ability to do things you want to do.  · Preventing infection and hospitalization is very important when you have COPD. Make sure to receive all the vaccines your health care provider recommends, especially the pneumococcal and influenza vaccines. Ask your healthcare provider whether you   need a pneumonia vaccine.  · Learn and use relaxation techniques to manage stress.  · Learn and use controlled breathing techniques as directed by your health care provider. Controlled breathing techniques include:    · Pursed lip breathing. Start by breathing in (inhaling) through your nose for 1 second. Then, purse your lips as if you were going to whistle and breathe out (exhale)  through the pursed lips for 2 seconds.    · Diaphragmatic breathing. Start by putting one hand on your abdomen just above your waist. Inhale slowly through your nose. The hand on your abdomen should move out. Then purse your lips and exhale slowly. You should be able to feel the hand on your abdomen moving in as you exhale.    · Learn and use controlled coughing to clear mucus from your lungs. Controlled coughing is a series of short, progressive coughs. The steps of controlled coughing are:    1. Lean your head slightly forward.    2. Breathe in deeply using diaphragmatic breathing.    3. Try to hold your breath for 3 seconds.    4. Keep your mouth slightly open while coughing twice.    5. Spit any mucus out into a tissue.    6. Rest and repeat the steps once or twice as needed.  SEEK MEDICAL CARE IF:   · You are coughing up more mucus than usual.    · There is a change in the color or thickness of your mucus.    · Your breathing is more labored than usual.    · Your breathing is faster than usual.    SEEK IMMEDIATE MEDICAL CARE IF:   · You have shortness of breath while you are resting.    · You have shortness of breath that prevents you from:  · Being able to talk.    · Performing your usual physical activities.    · You have chest pain lasting longer than 5 minutes.    · Your skin color is more cyanotic than usual.  · You measure low oxygen saturations for longer than 5 minutes with a pulse oximeter.  MAKE SURE YOU:   · Understand these instructions.  · Will watch your condition.  · Will get help right away if you are not doing well or get worse.  Document Released: 11/02/2004 Document Revised: 11/13/2012 Document Reviewed: 09/19/2012  [employer]® Patient Information ©2014 [employer].

## 2013-06-18 ENCOUNTER — Encounter: Payer: Self-pay | Admitting: Cardiovascular Disease

## 2013-06-18 ENCOUNTER — Ambulatory Visit (INDEPENDENT_AMBULATORY_CARE_PROVIDER_SITE_OTHER): Payer: Medicare Other | Admitting: Cardiovascular Disease

## 2013-06-18 VITALS — BP 132/70 | HR 92 | Ht 65.0 in | Wt 180.3 lb

## 2013-06-18 DIAGNOSIS — I10 Essential (primary) hypertension: Secondary | ICD-10-CM

## 2013-06-18 DIAGNOSIS — E785 Hyperlipidemia, unspecified: Secondary | ICD-10-CM

## 2013-06-18 NOTE — Progress Notes (Signed)
06/18/2013 Raymond Patrick   Mar 02, 1935  [id number]  Primary Physician MCKEOWN,WILLIAM DAVID, MD Primary Cardiologist: Lorretta Harp MD [name]   HPI:  Raymond Patrick is a delightful 78 year old mildly overweight single Caucasian male with no children referred by Dr. Melford Aase for cardiovascular evaluation. He lives alone. He is retired. 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 His cardiovascular risk factor profile from October 4 50 pack years of tobacco abuse having quit 10 years ago. He has treated hypertension and hyperlipidemia. There is no family history of heart disease. He is never have a heart attack or stroke. Does get mild dyspnea. His past medical history otherwise is remarkable for having undergone a VATS procedure by Dr. Baldemar Friday for what turned out to be malignancy. Dr. Earlie Server is his oncologist. He has had no recurrence.   Current Outpatient Prescriptions  Medication Sig Dispense Refill  . albuterol (PROVENTIL HFA;VENTOLIN HFA) 108 (90 BASE) MCG/ACT inhaler Inhale 1-2 puffs into the lungs every 6 (six) hours as needed for wheezing or shortness of breath.  18 g  2  . aspirin 81 MG tablet Take 81 mg by mouth daily.      Marland Kitchen azithromycin (ZITHROMAX) 250 MG tablet 2 tablets by mouth today then one tablet daily for 4 days.  6 tablet  1  . cetirizine (ZYRTEC) 10 MG tablet Take 1 tablet (10 mg total) by mouth daily. One tab daily for allergies  30 tablet  1  . Cholecalciferol (VITAMIN D) 2000 UNITS CAPS Take 4,000 Units by mouth daily.      Marland Kitchen HYDROcodone-acetaminophen (NORCO) 5-325 MG per tablet 1/2 to 1 tablet every 3 to 4 hour as needed for cough or pain  50 tablet  0  . ipratropium (ATROVENT) 0.06 % nasal spray Place 2 sprays into both nostrils 4 (four) times daily.  15 mL  12  . Multiple Vitamin (MULTIVITAMIN WITH MINERALS) TABS Take 1 tablet by mouth every evening.       . Omega-3 Fatty Acids (FISH OIL) 1000 MG CAPS Take 2 capsules by  mouth every evening.      . pravastatin (PRAVACHOL) 40 MG tablet Take 40 mg by mouth at bedtime.      . predniSONE (DELTASONE) 20 MG tablet Take 1 tablet (20 mg total) by mouth See admin instructions. 1 tab 3 x day for 3 days, then 1 tab 2 x day for 3 days, then 1 tab 1 x day for 5 days  20 tablet  0  . terazosin (HYTRIN) 10 MG capsule Take 10 mg by mouth daily.      Marland Kitchen Umeclidinium-Vilanterol (ANORO ELLIPTA) 62.5-25 MCG/INH AEPB One inhalation daily  60 each  0  . vitamin C (ASCORBIC ACID) 500 MG tablet Take 500 mg by mouth daily.       No current facility-administered medications for this visit.    Allergies  Allergen Reactions  . Nitrofuran Derivatives Shortness Of Breath  . Macrobid [Nitrofurantoin Macrocrystal]     Headache    History   Social History  . Marital Status: Single    Spouse Name: N/A    Number of Children: N/A  . Years of Education: N/A   Occupational History  . Not on file.   Social History Main Topics  . Smoking status: Former Smoker -- 1.00 packs/day for 35 years    Types: Cigarettes    Quit date: 02/07/2004  . Smokeless tobacco: Never Used  .  Alcohol Use: Yes     Comment: socially  . Drug Use: No  . Sexual Activity: Not on file   Other Topics Concern  . Not on file   Social History Narrative  . No narrative on file     Review of Systems: General: negative for chills, fever, night sweats or weight changes.  Cardiovascular: negative for chest pain, dyspnea on exertion, edema, orthopnea, palpitations, paroxysmal nocturnal dyspnea or shortness of breath Dermatological: negative for rash Respiratory: negative for cough or wheezing Urologic: negative for hematuria Abdominal: negative for nausea, vomiting, diarrhea, bright red blood per rectum, melena, or hematemesis Neurologic: negative for visual changes, syncope, or dizziness All other systems reviewed and are otherwise negative except as noted above.    Blood pressure 132/70, pulse 92, height  5\' 5"  (1.651 m), weight 180 lb 4.8 oz (81.784 kg).  General appearance: alert and no distress Neck: no adenopathy, no carotid bruit, no JVD, supple, symmetrical, trachea midline and thyroid not enlarged, symmetric, no tenderness/mass/nodules Lungs: clear to auscultation bilaterally Heart: regular rate and rhythm, S1, S2 normal, no murmur, click, rub or gallop Extremities: extremities normal, atraumatic, no cyanosis or edema and 2+ pedal pulses bilaterally  EKG normal sinus rhythm at 92 without ST or T wave changes  ASSESSMENT AND PLAN:   Hypertension Controlled on current medications  Hyperlipidemia On statin therapy with recent lipid profile performed 03/26/13 revealing a total cholesterol of 133, LDL of 53 an HDL of [place] MD [facility] 06/18/2013 3:45 PM

## 2013-06-18 NOTE — Patient Instructions (Signed)
Follow up with Dr Berry as needed.  

## 2013-06-18 NOTE — Assessment & Plan Note (Signed)
Controlled on current medications 

## 2013-06-18 NOTE — Assessment & Plan Note (Signed)
On statin therapy with recent lipid profile performed 03/26/13 revealing a total cholesterol of 133, LDL of 53 an HDL of 29

## 2013-06-27 ENCOUNTER — Encounter: Payer: Self-pay | Admitting: Physician Assistant

## 2013-06-27 ENCOUNTER — Ambulatory Visit (INDEPENDENT_AMBULATORY_CARE_PROVIDER_SITE_OTHER): Payer: Medicare Other | Admitting: Physician Assistant

## 2013-06-27 VITALS — BP 110/78 | HR 60 | Temp 97.7°F | Resp 16 | Wt 178.0 lb

## 2013-06-27 DIAGNOSIS — E785 Hyperlipidemia, unspecified: Secondary | ICD-10-CM

## 2013-06-27 DIAGNOSIS — I10 Essential (primary) hypertension: Secondary | ICD-10-CM

## 2013-06-27 DIAGNOSIS — Z79899 Other long term (current) drug therapy: Secondary | ICD-10-CM | POA: Diagnosis not present

## 2013-06-27 DIAGNOSIS — J449 Chronic obstructive pulmonary disease, unspecified: Secondary | ICD-10-CM | POA: Diagnosis not present

## 2013-06-27 DIAGNOSIS — R7303 Prediabetes: Secondary | ICD-10-CM

## 2013-06-27 DIAGNOSIS — E559 Vitamin D deficiency, unspecified: Secondary | ICD-10-CM

## 2013-06-27 DIAGNOSIS — R7309 Other abnormal glucose: Secondary | ICD-10-CM | POA: Diagnosis not present

## 2013-06-27 LAB — CBC WITH DIFFERENTIAL/PLATELET
Basophils Absolute: 0 10*3/uL (ref 0.0–0.1)
Basophils Relative: 0 % (ref 0–1)
EOS PCT: 1 % (ref 0–5)
Eosinophils Absolute: 0.1 10*3/uL (ref 0.0–0.7)
HEMATOCRIT: 42.2 % (ref 39.0–52.0)
Hemoglobin: 14.6 g/dL (ref 13.0–17.0)
LYMPHS ABS: 2.6 10*3/uL (ref 0.7–4.0)
LYMPHS PCT: 33 % (ref 12–46)
MCH: 31.4 pg (ref 26.0–34.0)
MCHC: 34.6 g/dL (ref 30.0–36.0)
MCV: 90.8 fL (ref 78.0–100.0)
MONO ABS: 0.6 10*3/uL (ref 0.1–1.0)
MONOS PCT: 7 % (ref 3–12)
Neutro Abs: 4.7 10*3/uL (ref 1.7–7.7)
Neutrophils Relative %: 59 % (ref 43–77)
Platelets: 183 10*3/uL (ref 150–400)
RBC: 4.65 MIL/uL (ref 4.22–5.81)
RDW: 14.4 % (ref 11.5–15.5)
WBC: 7.9 10*3/uL (ref 4.0–10.5)

## 2013-06-27 NOTE — Patient Instructions (Signed)
Chronic Obstructive Pulmonary Disease Chronic obstructive pulmonary disease (COPD) is a common lung condition in which airflow from the lungs is limited. COPD is a general term that can be used to describe many different lung problems that limit airflow, including both chronic bronchitis and emphysema. If you have COPD, your lung function will probably never return to normal, but there are measures you can take to improve lung function and make yourself feel better.  CAUSES   Smoking (common).   Exposure to secondhand smoke.   Genetic problems.  Chronic inflammatory lung diseases or recurrent infections. SYMPTOMS   Shortness of breath, especially with physical activity.   Deep, persistent (chronic) cough with a large amount of thick mucus.   Wheezing.   Rapid breaths (tachypnea).   Gray or bluish discoloration (cyanosis) of the skin, especially in fingers, toes, or lips.   Fatigue.   Weight loss.   Frequent infections or episodes when breathing symptoms become much worse (exacerbations).   Chest tightness. DIAGNOSIS  Your healthcare provider will take a medical history and perform a physical examination to make the initial diagnosis. Additional tests for COPD may include:   Lung (pulmonary) function tests.  Chest X-ray.  CT scan.  Blood tests. TREATMENT  Treatment available to help you feel better when you have COPD include:   Inhaler and nebulizer medicines. These help manage the symptoms of COPD and make your breathing more comfortable  Supplemental oxygen. Supplemental oxygen is only helpful if you have a low oxygen level in your blood.   Exercise and physical activity. These are beneficial for nearly all people with COPD. Some people may also benefit from a pulmonary rehabilitation program. HOME CARE INSTRUCTIONS   Take all medicines (inhaled or pills) as directed by your health care provider.  Only take over-the-counter or prescription medicines  for pain, fever, or discomfort as directed by your health care provider.   Avoid over-the-counter medicines or cough syrups that dry up your airway (such as antihistamines) and slow down the elimination of secretions unless instructed otherwise by your healthcare provider.   If you are a smoker, the most important thing that you can do is stop smoking. Continuing to smoke will cause further lung damage and breathing trouble. Ask your health care provider for help with quitting smoking. He or she can direct you to community resources or hospitals that provide support.  Avoid exposure to irritants such as smoke, chemicals, and fumes that aggravate your breathing.  Use oxygen therapy and pulmonary rehabilitation if directed by your health care provider. If you require home oxygen therapy, ask your healthcare provider whether you should purchase a pulse oximeter to measure your oxygen level at home.   Avoid contact with individuals who have a contagious illness.  Avoid extreme temperature and humidity changes.  Eat healthy foods. Eating smaller, more frequent meals and resting before meals may help you maintain your strength.  Stay active, but balance activity with periods of rest. Exercise and physical activity will help you maintain your ability to do things you want to do.  Preventing infection and hospitalization is very important when you have COPD. Make sure to receive all the vaccines your health care provider recommends, especially the pneumococcal and influenza vaccines. Ask your healthcare provider whether you need a pneumonia vaccine.  Learn and use relaxation techniques to manage stress.  Learn and use controlled breathing techniques as directed by your health care provider. Controlled breathing techniques include:   Pursed lip breathing. Start by breathing   in (inhaling) through your nose for 1 second. Then, purse your lips as if you were going to whistle and breathe out (exhale)  through the pursed lips for 2 seconds.   Diaphragmatic breathing. Start by putting one hand on your abdomen just above your waist. Inhale slowly through your nose. The hand on your abdomen should move out. Then purse your lips and exhale slowly. You should be able to feel the hand on your abdomen moving in as you exhale.   Learn and use controlled coughing to clear mucus from your lungs. Controlled coughing is a series of short, progressive coughs. The steps of controlled coughing are:  1. Lean your head slightly forward.  2. Breathe in deeply using diaphragmatic breathing.  3. Try to hold your breath for 3 seconds.  4. Keep your mouth slightly open while coughing twice.  5. Spit any mucus out into a tissue.  6. Rest and repeat the steps once or twice as needed. SEEK MEDICAL CARE IF:   You are coughing up more mucus than usual.   There is a change in the color or thickness of your mucus.   Your breathing is more labored than usual.   Your breathing is faster than usual.  SEEK IMMEDIATE MEDICAL CARE IF:   You have shortness of breath while you are resting.   You have shortness of breath that prevents you from:  Being able to talk.   Performing your usual physical activities.   You have chest pain lasting longer than 5 minutes.   Your skin color is more cyanotic than usual.  You measure low oxygen saturations for longer than 5 minutes with a pulse oximeter. MAKE SURE YOU:   Understand these instructions.  Will watch your condition.  Will get help right away if you are not doing well or get worse. Document Released: 11/02/2004 Document Revised: 11/13/2012 Document Reviewed: 09/19/2012 [facility] Patient Information 2014 [place], [place].    Bad carbs also include fruit juice, alcohol, and sweet tea. These are empty calories that do not signal to your brain that you are full.   Please remember the good carbs are still carbs which convert into sugar. So please  measure them out no more than 1/2-1 cup of rice, oatmeal, pasta, and beans.  Veggies are however free foods! Pile them on.   I like lean protein at every meal such as chicken, [place], pork chops, cottage cheese, etc. Just do not fry these meats and please center your meal around vegetable, the meats should be a side dish.   No all fruit is created equal. Please see the list below, the fruit at the bottom is higher in sugars than the fruit at the top

## 2013-06-27 NOTE — Progress Notes (Signed)
HPI 78 y.o. male  presents for 3 month follow up with hypertension, hyperlipidemia, prediabetes and vitamin D. His blood pressure has been controlled at home, today their BP is BP: 110/78 mmHg He does not workout. He denies chest pain, shortness of breath, dizziness.  He is on cholesterol medication and denies myalgias. His cholesterol is at goal. The cholesterol last visit was:   Lab Results  Component Value Date   CHOL 133 03/26/2013   HDL 29* 03/26/2013   LDLCALC 53 03/26/2013   TRIG 255* 03/26/2013   CHOLHDL 4.6 03/26/2013   He has been working on diet and exercise for prediabetes, and denies polydipsia, polyuria and visual disturbances. Last A1C in the office was:  Lab Results  Component Value Date   HGBA1C 5.7* 03/26/2013   Patient is on Vitamin D supplement.   Recently saw Dr. Gwenlyn Found and he states he is doing well.  Was recently treated for COPD exacerbation and he states his breathing is much better.    Current Medications:  Current Outpatient Prescriptions on File Prior to Visit  Medication Sig Dispense Refill  . albuterol (PROVENTIL HFA;VENTOLIN HFA) 108 (90 BASE) MCG/ACT inhaler Inhale 1-2 puffs into the lungs every 6 (six) hours as needed for wheezing or shortness of breath.  18 g  2  . aspirin 81 MG tablet Take 81 mg by mouth daily.      . cetirizine (ZYRTEC) 10 MG tablet Take 1 tablet (10 mg total) by mouth daily. One tab daily for allergies  30 tablet  1  . Cholecalciferol (VITAMIN D) 2000 UNITS CAPS Take 4,000 Units by mouth daily.      Marland Kitchen ipratropium (ATROVENT) 0.06 % nasal spray Place 2 sprays into both nostrils 4 (four) times daily.  15 mL  12  . Multiple Vitamin (MULTIVITAMIN WITH MINERALS) TABS Take 1 tablet by mouth every evening.       . Omega-3 Fatty Acids (FISH OIL) 1000 MG CAPS Take 2 capsules by mouth every evening.      . pravastatin (PRAVACHOL) 40 MG tablet Take 40 mg by mouth at bedtime.      Marland Kitchen terazosin (HYTRIN) 10 MG capsule Take 10 mg by mouth daily.      Marland Kitchen  Umeclidinium-Vilanterol (ANORO ELLIPTA) 62.5-25 MCG/INH AEPB One inhalation daily  60 each  0  . vitamin C (ASCORBIC ACID) 500 MG tablet Take 500 mg by mouth daily.       No current facility-administered medications on file prior to visit.   Medical History:  Past Medical History  Diagnosis Date  . BPH (benign prostatic hypertrophy)   . Hyperlipidemia   . COPD (chronic obstructive pulmonary disease)   . History of kidney stones   . Bladder tumor   . Impaired hearing bilateral aids  . History of lung cancer 2008  S/P RIGHT UPPER LOPECTOMY     STAGE I  NON-SMALL CELL LUNG CANCER--  ONCOLOGIST- DR MOHAMED-   NO RECURRENCE  . Hypertension   . Unspecified vitamin D deficiency   . Cancer     bladder, prostate, lung   Allergies:  Allergies  Allergen Reactions  . Nitrofuran Derivatives Shortness Of Breath  . Macrobid [Nitrofurantoin Macrocrystal]     Headache     Review of Systems: [X]  = complains of  [ ]  = denies  General: Fatigue [ ]  Fever [ ]  Chills [ ]  Weakness [ ]   Insomnia [ ]  Eyes: Redness [ ]  Blurred vision [ ]  Diplopia [ ]   ENT: Congestion [ ]  Sinus Pain [ ]  Post Nasal Drip [ ]  Sore Throat [ ]  Earache [ ]   Cardiac: Chest pain/pressure [ ]  SOB [ ]  Orthopnea [ ]   Palpitations [ ]   Paroxysmal nocturnal dyspnea[ ]  Claudication [ ]  Edema [ ]   Pulmonary: Cough [ ]  Wheezing[ ]   SOB [ ]   Snoring [ ]   GI: Nausea [ ]  Vomiting[ ]  Dysphagia[ ]  Heartburn[ ]  Abdominal pain [ ]  Constipation [ ] ; Diarrhea [ ] ; BRBPR [ ]  Melena[ ]  GU: Hematuria[ ]  Dysuria [ ]  Nocturia[ ]  Urgency [ ]   Hesitancy [ ]  Discharge [ ]  Neuro: Headaches[ ]  Vertigo[ ]  Paresthesias[ ]  Spasm [ ]  Speech changes [ ]  Incoordination [ ]   Ortho: Arthritis [ ]  Joint pain [ ]  Muscle pain [ ]  Joint swelling [ ]  Back Pain [ ]  Skin:  Rash [ ]   Pruritis [ ]  Change in skin lesion [ ]   Psych: Depression[ ]  Anxiety[ ]  Confusion [ ]  Memory loss [ ]   Heme/Lypmh: Bleeding [ ]  Bruising [ ]  Enlarged lymph nodes [ ]   Endocrine: Visual  blurring [ ]  Paresthesia [ ]  Polyuria [ ]  Polydypsea [ ]    Heat/cold intolerance [ ]  Hypoglycemia [ ]   Family history- Review and unchanged Social history- Review and unchanged Physical Exam: BP 110/78  Pulse 60  Temp(Src) 97.7 F (36.5 C)  Resp 16  Wt 178 lb (80.74 kg) Wt Readings from Last 3 Encounters:  06/27/13 178 lb (80.74 kg)  06/18/13 180 lb 4.8 oz (81.784 kg)  05/27/13 180 lb (81.647 kg)   General Appearance: Well nourished, in no apparent distress. Eyes: PERRLA, EOMs, conjunctiva no swelling or erythema Sinuses: No Frontal/maxillary tenderness ENT/Mouth: Ext aud canals clear, TMs without erythema, bulging. No erythema, swelling, or exudate on post pharynx.  Tonsils not swollen or erythematous. Hearing normal.  Neck: Supple, thyroid normal.  Respiratory: Respiratory effort normal, BS equal bilaterally without rales, rhonchi, wheezing or stridor.  Cardio: RRR with no MRGs. Brisk peripheral pulses without edema.  Abdomen: Soft, + BS.  Non tender, no guarding, rebound, hernias, masses. Lymphatics: Non tender without lymphadenopathy.  Musculoskeletal: Full ROM, 5/5 strength, normal gait.  Skin: Warm, dry without rashes, lesions, ecchymosis.  Neuro: Cranial nerves intact. Normal muscle tone, no cerebellar symptoms. Sensation intact.  Psych: Awake and oriented X 3, normal affect, Insight and Judgment appropriate.   Assessment and Plan:  Hypertension: Continue medication, monitor blood pressure at home. Continue DASH diet. Cholesterol: Continue diet and exercise. Check cholesterol.  Pre-diabetes-Continue diet and exercise. Check A1C Vitamin D Def- check level and continue medications.   Continue diet and meds as discussed. Further disposition pending results of labs.  Vicie Mutters 10:57 AM

## 2013-06-28 LAB — BASIC METABOLIC PANEL WITH GFR
BUN: 11 mg/dL (ref 6–23)
CHLORIDE: 106 meq/L (ref 96–112)
CO2: 27 mEq/L (ref 19–32)
Calcium: 9.4 mg/dL (ref 8.4–10.5)
Creat: 0.86 mg/dL (ref 0.50–1.35)
GFR, Est African American: >89 mL/min
GFR, Est Non African American: 85 mL/min
GLUCOSE: 83 mg/dL (ref 70–99)
Potassium: 4.5 mEq/L (ref 3.5–5.3)
Sodium: 141 mEq/L (ref 135–145)

## 2013-06-28 LAB — HEPATIC FUNCTION PANEL
ALBUMIN: 4 g/dL (ref 3.5–5.2)
ALT: 25 U/L (ref 0–53)
AST: 20 U/L (ref 0–37)
Alkaline Phosphatase: 67 U/L (ref 39–117)
BILIRUBIN TOTAL: 0.9 mg/dL (ref 0.2–1.2)
Bilirubin, Direct: 0.2 mg/dL (ref 0.0–0.3)
Indirect Bilirubin: 0.7 mg/dL (ref 0.2–1.2)
Total Protein: 6.7 g/dL (ref 6.0–8.3)

## 2013-06-28 LAB — MAGNESIUM: MAGNESIUM: 2.2 mg/dL (ref 1.5–2.5)

## 2013-06-28 LAB — TSH: TSH: 1.054 u[IU]/mL (ref 0.350–4.500)

## 2013-06-28 LAB — LIPID PANEL
Cholesterol: 162 mg/dL (ref 0–200)
HDL: 45 mg/dL (ref >39)
LDL Cholesterol: 92 mg/dL (ref 0–99)
Total CHOL/HDL Ratio: 3.6 Ratio
Triglycerides: 126 mg/dL (ref <150)
VLDL: 25 mg/dL (ref 0–40)

## 2013-06-28 LAB — INSULIN, FASTING: Insulin fasting, serum: 9 u[IU]/mL (ref 3–28)

## 2013-06-28 LAB — HEMOGLOBIN A1C
HEMOGLOBIN A1C: 5.5 % (ref <5.7)
Mean Plasma Glucose: 111 mg/dL (ref <117)

## 2013-07-11 DIAGNOSIS — R339 Retention of urine, unspecified: Secondary | ICD-10-CM | POA: Diagnosis not present

## 2013-07-11 DIAGNOSIS — C679 Malignant neoplasm of bladder, unspecified: Secondary | ICD-10-CM | POA: Diagnosis not present

## 2013-07-11 DIAGNOSIS — C670 Malignant neoplasm of trigone of bladder: Secondary | ICD-10-CM | POA: Diagnosis not present

## 2013-09-01 ENCOUNTER — Other Ambulatory Visit: Payer: Self-pay | Admitting: *Deleted

## 2013-09-01 MED ORDER — PRAVASTATIN SODIUM 40 MG PO TABS: 40.0000 mg | ORAL_TABLET | Freq: Every day | ORAL | Status: DC

## 2013-09-28 NOTE — Patient Instructions (Addendum)
Recommend the book "The END of DIETING" by Dr Baker Janus   and the book "The END of DIABETES " by Dr Excell Seltzer  At [email or web address] - get book & Audio CD's      Being diabetic has a  300% increased risk for heart attack, stroke, cancer, and alzheimer- type vascular dementia. It is very important that you work harder with diet by avoiding all foods that are white except chicken & fish. Avoid white rice (brown & wild rice is OK), white potatoes (sweetpotatoes in moderation is OK), White bread or wheat bread or anything made out of white flour like bagels, donuts, rolls, buns, biscuits, cakes, pastries, cookies, pizza crust, and pasta (made from white flour & egg whites) - vegetarian pasta or spinach or wheat pasta is OK. Multigrain breads like Arnold's or [employer], or multigrain sandwich thins or flatbreads.  Diet, exercise and weight loss can reverse and cure diabetes in the early stages.  Diet, exercise and weight loss is very important in the control and prevention of complications of diabetes which affects every system in your body, ie. Brain - dementia/stroke, eyes - glaucoma/blindness, heart - heart attack/heart failure, kidneys - dialysis, stomach - gastric paralysis, intestines - malabsorption, nerves - severe painful neuritis, circulation - gangrene & loss of a leg(s), and finally cancer and Alzheimers.    I recommend avoid fried & greasy foods,  sweets/candy, white rice (brown or wild rice or Quinoa is OK), white potatoes (sweet potatoes are OK) - anything made from white flour - bagels, doughnuts, rolls, buns, biscuits,white and wheat breads, pizza crust and traditional pasta made of white flour & egg white(vegetarian pasta or spinach or wheat pasta is OK).  Multi-grain bread is OK - like multi-grain flat bread or sandwich thins. Avoid alcohol in excess. Exercise is also important.    Eat all the vegetables you want - avoid meat, especially red meat and dairy - especially cheese.  Cheese  is the most concentrated form of trans-fats which is the worst thing to clog up our arteries. Veggie cheese is OK which can be found in the fresh produce section at [employer] or [employer] or [employer]  Preventive Care for Adults A healthy lifestyle and preventive care can promote health and wellness. Preventive health guidelines for men include the following key practices:  A routine yearly physical is a good way to check with your health care provider about your health and preventative screening. It is a chance to share any concerns and updates on your health and to receive a thorough exam.  Visit your dentist for a routine exam and preventative care every 6 months. Brush your teeth twice a day and floss once a day. Good oral hygiene prevents tooth decay and gum disease.  The frequency of eye exams is based on your age, health, family medical history, use of contact lenses, and other factors. Follow your health care provider's recommendations for frequency of eye exams.  Eat a healthy diet. Foods such as vegetables, fruits, whole grains, low-fat dairy products, and lean protein foods contain the nutrients you need without too many calories. Decrease your intake of foods high in solid fats, added sugars, and salt. Eat the right amount of calories for you.Get information about a proper diet from your health care provider, if necessary.  Regular physical exercise is one of the most important things you can do for your health. Most adults should get at least 150 minutes of moderate-intensity exercise (any activity that  increases your heart rate and causes you to sweat) each week. In addition, most adults need muscle-strengthening exercises on 2 or more days a week.  Maintain a healthy weight. The body mass index (BMI) is a screening tool to identify possible weight problems. It provides an estimate of body fat based on height and weight. Your health care provider can find your BMI and can help you  achieve or maintain a healthy weight.For adults 20 years and older:  A BMI below 18.5 is considered underweight.  A BMI of 18.5 to 24.9 is normal.  A BMI of 25 to 29.9 is considered overweight.  A BMI of 30 and above is considered obese.  Maintain normal blood lipids and cholesterol levels by exercising and minimizing your intake of saturated fat. Eat a balanced diet with plenty of fruit and vegetables. Blood tests for lipids and cholesterol should begin at age 20 and be repeated every 5 years. If your lipid or cholesterol levels are high, you are over 50, or you are at high risk for heart disease, you may need your cholesterol levels checked more frequently.Ongoing high lipid and cholesterol levels should be treated with medicines if diet and exercise are not working.  If you smoke, find out from your health care provider how to quit. If you do not use tobacco, do not start.  Lung cancer screening is recommended for adults aged 72-80 years who are at high risk for developing lung cancer because of a history of smoking. A yearly low-dose CT scan of the lungs is recommended for people who have at least a 30-pack-year history of smoking and are a current smoker or have quit within the past 15 years. A pack year of smoking is smoking an average of 1 pack of cigarettes a day for 1 year (for example: 1 pack a day for 30 years or 2 packs a day for 15 years). Yearly screening should continue until the smoker has stopped smoking for at least 15 years. Yearly screening should be stopped for people who develop a health problem that would prevent them from having lung cancer treatment.  If you choose to drink alcohol, do not have more than 2 drinks per day. One drink is considered to be 12 ounces (355 mL) of beer, 5 ounces (148 mL) of wine, or 1.5 ounces (44 mL) of liquor.  Avoid use of street drugs. Do not share needles with anyone. Ask for help if you need support or instructions about stopping the use of  drugs.  High blood pressure causes heart disease and increases the risk of stroke. Your blood pressure should be checked at least every 1-2 years. Ongoing high blood pressure should be treated with medicines, if weight loss and exercise are not effective.  If you are 28-64 years old, ask your health care provider if you should take aspirin to prevent heart disease.  Diabetes screening involves taking a blood sample to check your fasting blood sugar level. This should be done once every 3 years, after age 13, if you are within normal weight and without risk factors for diabetes. Testing should be considered at a younger age or be carried out more frequently if you are overweight and have at least 1 risk factor for diabetes.  Colorectal cancer can be detected and often prevented. Most routine colorectal cancer screening begins at the age of 78 and continues through age 56. However, your health care provider may recommend screening at an earlier age if you have risk  factors for colon cancer. On a yearly basis, your health care provider may provide home test kits to check for hidden blood in the stool. Use of a small camera at the end of a tube to directly examine the colon (sigmoidoscopy or colonoscopy) can detect the earliest forms of colorectal cancer. Talk to your health care provider about this at age 48, when routine screening begins. Direct exam of the colon should be repeated every 5-10 years through age 60, unless early forms of precancerous polyps or small growths are found.  People who are at an increased risk for hepatitis B should be screened for this virus. You are considered at high risk for hepatitis B if:  You were born in a country where hepatitis B occurs often. Talk with your health care provider about which countries are considered high risk.  Your parents were born in a high-risk country and you have not received a shot to protect against hepatitis B (hepatitis B vaccine).  You have  HIV or AIDS.  You use needles to inject street drugs.  You live with, or have sex with, someone who has hepatitis B.  You are a man who has sex with other men (MSM).  You get hemodialysis treatment.  You take certain medicines for conditions such as cancer, organ transplantation, and autoimmune conditions.  Hepatitis C blood testing is recommended for all people born from [birth year] through 1965 and any individual with known risks for hepatitis C.  Practice safe sex. Use condoms and avoid high-risk sexual practices to reduce the spread of sexually transmitted infections (STIs). STIs include gonorrhea, chlamydia, syphilis, trichomonas, herpes, HPV, and human immunodeficiency virus (HIV). Herpes, HIV, and HPV are viral illnesses that have no cure. They can result in disability, cancer, and death.  If you are at risk of being infected with HIV, it is recommended that you take a prescription medicine daily to prevent HIV infection. This is called preexposure prophylaxis (PrEP). You are considered at risk if:  You are a man who has sex with other men (MSM) and have other risk factors.  You are a heterosexual man, are sexually active, and are at increased risk for HIV infection.  You take drugs by injection.  You are sexually active with a partner who has HIV.  Talk with your health care provider about whether you are at high risk of being infected with HIV. If you choose to begin PrEP, you should first be tested for HIV. You should then be tested every 3 months for as long as you are taking PrEP.  A one-time screening for abdominal aortic aneurysm (AAA) and surgical repair of large AAAs by ultrasound are recommended for men ages 51 to 11 years who are current or former smokers.  Healthy men should no longer receive prostate-specific antigen (PSA) blood tests as part of routine cancer screening. Talk with your health care provider about prostate cancer screening.  Testicular cancer screening is  not recommended for adult males who have no symptoms. Screening includes self-exam, a health care provider exam, and other screening tests. Consult with your health care provider about any symptoms you have or any concerns you have about testicular cancer.  Use sunscreen. Apply sunscreen liberally and repeatedly throughout the day. You should seek shade when your shadow is shorter than you. Protect yourself by wearing long sleeves, pants, a wide-brimmed hat, and sunglasses year round, whenever you are outdoors.  Once a month, do a whole-body skin exam, using a mirror to look  at the skin on your back. Tell your health care provider about new moles, moles that have irregular borders, moles that are larger than a pencil eraser, or moles that have changed in shape or color.  Stay current with required vaccines (immunizations).  Influenza vaccine. All adults should be immunized every year.  Tetanus, diphtheria, and acellular pertussis (Td, Tdap) vaccine. An adult who has not previously received Tdap or who does not know his vaccine status should receive 1 dose of Tdap. This initial dose should be followed by tetanus and diphtheria toxoids (Td) booster doses every 10 years. Adults with an unknown or incomplete history of completing a 3-dose immunization series with Td-containing vaccines should begin or complete a primary immunization series including a Tdap dose. Adults should receive a Td booster every 10 years.  Varicella vaccine. An adult without evidence of immunity to varicella should receive 2 doses or a second dose if he has previously received 1 dose.  Human papillomavirus (HPV) vaccine. Males aged 29-21 years who have not received the vaccine previously should receive the 3-dose series. Males aged 22-26 years may be immunized. Immunization is recommended through the age of 26 years for any male who has sex with males and did not get any or all doses earlier. Immunization is recommended for any  person with an immunocompromised condition through the age of 29 years if he did not get any or all doses earlier. During the 3-dose series, the second dose should be obtained 4-8 weeks after the first dose. The third dose should be obtained 24 weeks after the first dose and 16 weeks after the second dose.  Zoster vaccine. One dose is recommended for adults aged 38 years or older unless certain conditions are present.  Measles, mumps, and rubella (MMR) vaccine. Adults born before [birth year] generally are considered immune to measles and mumps. Adults born in [birth year] or later should have 1 or more doses of MMR vaccine unless there is a contraindication to the vaccine or there is laboratory evidence of immunity to each of the three diseases. A routine second dose of MMR vaccine should be obtained at least 28 days after the first dose for students attending postsecondary schools, health care workers, or international travelers. People who received inactivated measles vaccine or an unknown type of measles vaccine during 1963-1967 should receive 2 doses of MMR vaccine. People who received inactivated mumps vaccine or an unknown type of mumps vaccine before 1979 and are at high risk for mumps infection should consider immunization with 2 doses of MMR vaccine. Unvaccinated health care workers born before [birth year] who lack laboratory evidence of measles, mumps, or rubella immunity or laboratory confirmation of disease should consider measles and mumps immunization with 2 doses of MMR vaccine or rubella immunization with 1 dose of MMR vaccine.  Pneumococcal 13-valent conjugate (PCV13) vaccine. When indicated, a person who is uncertain of his immunization history and has no record of immunization should receive the PCV13 vaccine. An adult aged 68 years or older who has certain medical conditions and has not been previously immunized should receive 1 dose of PCV13 vaccine. This PCV13 should be followed with a dose of pneumococcal  polysaccharide (PPSV23) vaccine. The PPSV23 vaccine dose should be obtained at least 8 weeks after the dose of PCV13 vaccine. An adult aged 95 years or older who has certain medical conditions and previously received 1 or more doses of PPSV23 vaccine should receive 1 dose of PCV13. The PCV13 vaccine dose should be obtained 1  or more years after the last PPSV23 vaccine dose.  Pneumococcal polysaccharide (PPSV23) vaccine. When PCV13 is also indicated, PCV13 should be obtained first. All adults aged 59 years and older should be immunized. An adult younger than age 29 years who has certain medical conditions should be immunized. Any person who resides in a nursing home or long-term care facility should be immunized. An adult smoker should be immunized. People with an immunocompromised condition and certain other conditions should receive both PCV13 and PPSV23 vaccines. People with human immunodeficiency virus (HIV) infection should be immunized as soon as possible after diagnosis. Immunization during chemotherapy or radiation therapy should be avoided. Routine use of PPSV23 vaccine is not recommended for American Indians, [place] Natives, or people younger than 65 years unless there are medical conditions that require PPSV23 vaccine. When indicated, people who have unknown immunization and have no record of immunization should receive PPSV23 vaccine. One-time revaccination 5 years after the first dose of PPSV23 is recommended for people aged 19-64 years who have chronic kidney failure, nephrotic syndrome, asplenia, or immunocompromised conditions. People who received 1-2 doses of PPSV23 before age 20 years should receive another dose of PPSV23 vaccine at age 87 years or later if at least 5 years have passed since the previous dose. Doses of PPSV23 are not needed for people immunized with PPSV23 at or after age 23 years.  Meningococcal vaccine. Adults with asplenia or persistent complement component deficiencies  should receive 2 doses of quadrivalent meningococcal conjugate (MenACWY-D) vaccine. The doses should be obtained at least 2 months apart. Microbiologists working with certain meningococcal bacteria, [place] recruits, people at risk during an outbreak, and people who travel to or live in countries with a high rate of meningitis should be immunized. A first-year college student up through age 30 years who is living in a residence hall should receive a dose if he did not receive a dose on or after his 16th birthday. Adults who have certain high-risk conditions should receive one or more doses of vaccine.  Hepatitis A vaccine. Adults who wish to be protected from this disease, have certain high-risk conditions, work with hepatitis A-infected animals, work in hepatitis A research labs, or travel to or work in countries with a high rate of hepatitis A should be immunized. Adults who were previously unvaccinated and who anticipate close contact with an international adoptee during the first 60 days after arrival in the [place] from a country with a high rate of hepatitis A should be immunized.  Hepatitis B vaccine. Adults should be immunized if they wish to be protected from this disease, have certain high-risk conditions, may be exposed to blood or other infectious body fluids, are household contacts or sex partners of hepatitis B positive people, are clients or workers in certain care facilities, or travel to or work in countries with a high rate of hepatitis B.  Haemophilus influenzae type b (Hib) vaccine. A previously unvaccinated person with asplenia or sickle cell disease or having a scheduled splenectomy should receive 1 dose of Hib vaccine. Regardless of previous immunization, a recipient of a hematopoietic stem cell transplant should receive a 3-dose series 6-12 months after his successful transplant. Hib vaccine is not recommended for adults with HIV infection. Preventive Service / Frequency Ages  13 and over  Blood pressure check.** / Every 1 to 2 years.  Lipid and cholesterol check.**/ Every 5 years beginning at age 33.  Lung cancer screening. / Every year if you are aged 51-80 years  and have a 30-pack-year history of smoking and currently smoke or have quit within the past 15 years. Yearly screening is stopped once you have quit smoking for at least 15 years or develop a health problem that would prevent you from having lung cancer treatment.  Fecal occult blood test (FOBT) of stool. / Every year beginning at age 51 and continuing until age 72. You may not have to do this test if you get a colonoscopy every 10 years.  Flexible sigmoidoscopy** or colonoscopy.** / Every 5 years for a flexible sigmoidoscopy or every 10 years for a colonoscopy beginning at age 58 and continuing until age 64.  Hepatitis C blood test.** / For all people born from [birth year] through 1963 and any individual with known risks for hepatitis C.  Abdominal aortic aneurysm (AAA) screening.** / Screening for ages 44 to 46 years who are current or former smokers or have history of hypertension  Skin self-exam. / Monthly.  Influenza vaccine. / Every year.  Tetanus, diphtheria, and acellular pertussis (Tdap/Td) vaccine.** / 1 dose of Td every 10 years.  Varicella vaccine.** / Consult your health care provider.  Zoster vaccine.** / 1 dose for adults aged 37 years or older.  Pneumococcal 13-valent conjugate (PCV13) vaccine.** / Consult your health care provider.  Pneumococcal polysaccharide (PPSV23) vaccine.** / 1 dose for all adults aged 73 years and older.  Meningococcal vaccine.** / Consult your health care provider.  Hepatitis A vaccine.** / Consult your health care provider.  Hepatitis B vaccine.** / Consult your health care provider.  Haemophilus influenzae type b (Hib) vaccine.** / Consult your health care provider. **Family history and personal history of risk and conditions may change your health care  provider's recommendations. Document Released: 03/21/2001 Document Revised: 01/28/2013 Document Reviewed: 06/20/2010 [facility] Patient Information 2015 [place], [place]. This information is not intended to replace advice given to you by your health care provider. Make sure you discuss any questions you have with your health care provider.

## 2013-09-28 NOTE — Progress Notes (Deleted)
Patient ID: Raymond Patrick, male   DOB: [birth year], 78 y.o.   MRN: [id number]   Annual Screening Comprehensive Examination  This very nice 78 y.o.SWM presents for complete physical.  Patient has been followed for HTN,  Prediabetes, Hyperlipidemia, and Vitamin D Deficiency. Patient was treated w/RULobectomy and radiation in 2008 for Lung Cancer and is followed by Raymond Raymond Patrick.   HTN predates since 1998. Patient's BP has been controlled at home.Today's BP: 126/68 mmHg. Patient denies any cardiac symptoms as chest pain, palpitations, shortness of breath, dizziness or ankle swelling.   Patient's hyperlipidemia is controlled with diet and medications. Patient denies myalgias or other medication SE's. Last lipids were  Cholesterol162; HDL 45; LDL 92; Triglycerides 126 on 06/27/2013.   Patient has prediabetes since    and patient denies reactive hypoglycemic symptoms, visual blurring, diabetic polys or paresthesias. Last A1c was  5.5% on 06/27/2013.   Finally, patient has history of Vitamin D Deficiency of 30 in 2008  and last vitamin D was 75 on 03/26/2013.   Medication Sig  . albuterol  HFA  inhaler Inhale 1-2 puffs into the lungs every 6 (six) hours as needed   . aspirin 81 MG tablet Take 81 mg by mouth daily.  . cetirizine (ZYRTEC) 10 MG tablet Take 1 tablet (10 mg total) by mouth daily. One tab daily for allergies  . VITAMIN D 2000 UNITS CAPS Take 4,000 Units by mouth daily.  Marland Kitchen iATROVENT) 0 nasal spray Place 2 sprays into both nostrils 4 (four) times daily.  . MULTIVITAMIN WITH MINERALS Take 1 tablet by mouth every evening.   Marland Kitchen FISH OIL 1000 MG CAPS Take 1 capsule by mouth every evening.   . pravastatin  40 MG tablet Take 1 tablet (40 mg total) by mouth at bedtime.  Marland Kitchen terazosin (HYTRIN) 10 MG capsule Take 10 mg by mouth daily.  Raymond Patrick ELLIPTA) 62.5-25  One inhalation daily  . vitamin C 500 MG tablet Take 500 mg by mouth daily.    Allergies  Allergen Reactions  . Nitrofuran Derivatives  Shortness Of Breath  . Macrobid [Nitrofurantoin Macrocrystal]     Headache   Past Medical History  Diagnosis Date  . BPH (benign prostatic hypertrophy)   . Hyperlipidemia   . COPD (chronic obstructive pulmonary disease)   . History of kidney stones   . Bladder tumor   . Impaired hearing bilateral aids  . History of lung cancer 2008  S/P RULobectomy    STAGE I  NON-SMALL CELL LUNG CANCER - Raymond Patrick-   NO RECURRENCE  . Hypertension   . Unspecified vitamin D deficiency    Past Surgical History  Procedure Laterality Date  . Lung removal, partial  2008  . Lithotripsy  2012  . Fiberoptic bronchoscopy  10-28-2008  . Right upper lung lobectomy  09-21-2006  Raymond Patrick    NON-SMALL CELL CANCER/ SEVERE COPD  . Transurethral resection of prostate  12/25/2011    Procedure: TRANSURETHRAL RESECTION OF THE PROSTATE WITH GYRUS INSTRUMENTS;  Surgeon: Raymond Gallo, MD;  Location: [facility];  Service: Urology;  Laterality: N/A;  2 HRS   . Transurethral resection of bladder tumor  12/25/2011    Procedure: TRANSURETHRAL RESECTION OF BLADDER TUMOR (TURBT);  Surgeon: Raymond Gallo, MD;  Location: [facility];  Service: Urology;  Laterality: N/A;  . Prostate surgery      prostatectomy   Family History  Problem Relation Age of Onset  . Cancer Mother  Non-Hodgkin's Lymphoma  . Diabetes Mother   . Hypertension Mother   . Cancer Father     Colon cancer w/ metastasis to lung & bone  . Cancer Brother 70    Lung cancer  . Diabetes Maternal Grandmother   . Heart failure Maternal Grandmother   . Cancer Brother 65    Non-small cell Lung cancer  . Pneumonia Maternal Grandfather    History   Social History  . Marital Status: Single    Spouse Name: N/A    Number of Children: N/A  . Years of Education: N/A   Occupational History  . Retired Optometrist from [employer]  . Smoking status: Former Smoker -- 1.00  packs/day for 35 years    Types: Cigarettes    Quit date: 02/07/2004  . Smokeless tobacco: Never Used  . Alcohol Use: Yes     Comment: socially  . Drug Use: No  . Sexual Activity: Not on file    ROS Constitutional: Denies fever, chills, weight loss/gain, headaches, insomnia, fatigue, night sweats or change in appetite. Eyes: Denies redness, blurred vision, diplopia, discharge, itchy or watery eyes.  ENT: Denies discharge, congestion, post nasal drip, epistaxis, sore throat, earache, hearing loss, dental pain, Tinnitus, Vertigo, Sinus pain or snoring.  Cardio: Denies chest pain, palpitations, irregular heartbeat, syncope, dyspnea, diaphoresis, orthopnea, PND, claudication or edema Respiratory: denies cough, dyspnea, DOE, pleurisy, hoarseness, laryngitis or wheezing.  Gastrointestinal: Denies dysphagia, heartburn, reflux, water brash, pain, cramps, nausea, vomiting, bloating, diarrhea, constipation, hematemesis, melena, hematochezia, jaundice or hemorrhoids Genitourinary: Denies dysuria, frequency, urgency, nocturia, hesitancy, discharge, hematuria or flank pain Musculoskeletal: Denies arthralgia, myalgia, stiffness, Jt. Swelling, pain, limp or strain/sprain. Denies Falls. Skin: Denies puritis, rash, hives, warts, acne, eczema or change in skin lesion Neuro: No weakness, tremor, incoordination, spasms, paresthesia or pain Psychiatric: Denies confusion, memory loss or sensory loss. Denies Depression. Endocrine: Denies change in weight, skin, hair change, nocturia, and paresthesia, diabetic polys, visual blurring or hyper / hypo glycemic episodes.  Heme/Lymph: No excessive bleeding, bruising or enlarged lymph nodes.   Physical Exam  BP 126/68  Pulse 60  Temp(Src) 97.5 F (36.4 C) (Temporal)  Resp 16  Ht 5' 4.25" (1.632 m)  Wt 179 lb 6.4 oz (81.375 kg)  BMI 30.55 kg/m2  General Appearance: Well nourished, in no apparent distress. Eyes: PERRLA, EOMs, conjunctiva no swelling or  erythema, normal fundi and vessels. Sinuses: No frontal/maxillary tenderness ENT/Mouth: EACs patent / TMs  nl. Nares clear without erythema, swelling, mucoid exudates. Oral hygiene is good. No erythema, swelling, or exudate. Tongue normal, non-obstructing. Tonsils not swollen or erythematous. Hearing normal.  Neck: Supple, thyroid normal. No bruits, nodes or JVD. Respiratory: Respiratory effort normal.  BS equal and clear bilateral without rales, rhonci, wheezing or stridor. Cardio: Heart sounds are normal with regular rate and rhythm and no murmurs, rubs or gallops. Peripheral pulses are normal and equal bilaterally without edema. No aortic or femoral bruits. Chest: symmetric with normal excursions and percussion.  Abdomen: Flat, soft, with bowl sounds. Nontender, no guarding, rebound, hernias, masses, or organomegaly.  Lymphatics: Non tender without lymphadenopathy.  Genitourinary: No hernias.Testes nl. DRE - prostate nl for age - smooth & firm w/o nodules. Musculoskeletal: Full ROM all peripheral extremities, joint stability, 5/5 strength, and normal gait. Skin: Warm and dry without rashes, lesions, cyanosis, clubbing or  ecchymosis.  Neuro: Cranial nerves intact, reflexes equal bilaterally. Normal muscle tone, no cerebellar symptoms. Sensation intact.  Pysch: Awake and  oriented X 3with normal affect, insight and judgment appropriate.   Assessment and Plan  1. Annual Screening Examination 2. Hypertension  3. Hyperlipidemia 4. Pre Diabetes 5. Vitamin D Deficiency 6. Lung Cancer, Hx  Continue prudent diet as discussed, weight control, BP monitoring, regular exercise, and medications as discussed.  Discussed med effects and SE's. Routine screening labs and tests as requested with regular follow-up as recommended.

## 2013-09-29 ENCOUNTER — Ambulatory Visit (INDEPENDENT_AMBULATORY_CARE_PROVIDER_SITE_OTHER): Payer: Medicare Other | Admitting: Internal Medicine

## 2013-09-29 ENCOUNTER — Encounter: Payer: Self-pay | Admitting: Internal Medicine

## 2013-09-29 VITALS — BP 126/68 | HR 60 | Temp 97.5°F | Resp 16 | Ht 64.25 in | Wt 179.4 lb

## 2013-09-29 DIAGNOSIS — Z0000 Encounter for general adult medical examination without abnormal findings: Secondary | ICD-10-CM

## 2013-09-29 DIAGNOSIS — Z1331 Encounter for screening for depression: Secondary | ICD-10-CM

## 2013-09-29 DIAGNOSIS — Z1212 Encounter for screening for malignant neoplasm of rectum: Secondary | ICD-10-CM

## 2013-09-29 DIAGNOSIS — Z125 Encounter for screening for malignant neoplasm of prostate: Secondary | ICD-10-CM

## 2013-09-29 DIAGNOSIS — E559 Vitamin D deficiency, unspecified: Secondary | ICD-10-CM | POA: Diagnosis not present

## 2013-09-29 DIAGNOSIS — R7303 Prediabetes: Secondary | ICD-10-CM

## 2013-09-29 DIAGNOSIS — R7309 Other abnormal glucose: Secondary | ICD-10-CM

## 2013-09-29 DIAGNOSIS — Z79899 Other long term (current) drug therapy: Secondary | ICD-10-CM | POA: Diagnosis not present

## 2013-09-29 DIAGNOSIS — I10 Essential (primary) hypertension: Secondary | ICD-10-CM | POA: Diagnosis not present

## 2013-09-29 DIAGNOSIS — E782 Mixed hyperlipidemia: Secondary | ICD-10-CM

## 2013-09-29 DIAGNOSIS — Z789 Other specified health status: Secondary | ICD-10-CM | POA: Diagnosis not present

## 2013-09-29 LAB — CBC WITH DIFFERENTIAL/PLATELET
BASOS ABS: 0 10*3/uL (ref 0.0–0.1)
BASOS PCT: 0 % (ref 0–1)
EOS ABS: 0.1 10*3/uL (ref 0.0–0.7)
Eosinophils Relative: 1 % (ref 0–5)
HCT: 40.9 % (ref 39.0–52.0)
Hemoglobin: 14.4 g/dL (ref 13.0–17.0)
Lymphocytes Relative: 40 % (ref 12–46)
Lymphs Abs: 3 10*3/uL (ref 0.7–4.0)
MCH: 32.3 pg (ref 26.0–34.0)
MCHC: 35.2 g/dL (ref 30.0–36.0)
MCV: 91.7 fL (ref 78.0–100.0)
Monocytes Absolute: 0.5 10*3/uL (ref 0.1–1.0)
Monocytes Relative: 7 % (ref 3–12)
NEUTROS ABS: 4 10*3/uL (ref 1.7–7.7)
Neutrophils Relative %: 52 % (ref 43–77)
Platelets: 189 10*3/uL (ref 150–400)
RBC: 4.46 MIL/uL (ref 4.22–5.81)
RDW: 13.6 % (ref 11.5–15.5)
WBC: 7.6 10*3/uL (ref 4.0–10.5)

## 2013-09-29 LAB — HEMOGLOBIN A1C
Hgb A1c MFr Bld: 5.6 % (ref <5.7)
MEAN PLASMA GLUCOSE: 114 mg/dL (ref <117)

## 2013-09-29 NOTE — Progress Notes (Signed)
Patient ID: Raymond Patrick, male   DOB: 01-23-1936, 78 y.o.   MRN: [id number]  ANNUAL COMPREHENSIVE EXAMINATION  This very nice 78 y.o.SWM presents for complete physical. Patient has been followed for HTN, Prediabetes, Hyperlipidemia, and Vitamin D Deficiency. Patient was treated w/RULobectomy and radiation in 2008 for Lung Cancer and he  is followed by Dr Earlie Server.   HTN predates since 1998. Patient's BP has been controlled at home.Today's BP: 126/68 mmHg. Patient denies any cardiac symptoms as chest pain, palpitations, shortness of breath, dizziness or ankle swelling.   Patient's hyperlipidemia is controlled with diet and medications. Patient denies myalgias or other medication SE's. Last lipids were at goal with Cholesterol162; HDL 45; LDL 92; Triglycerides 126 on 06/27/2013.   Patient has prediabetes since  2011 with A1c 6.2% and patient denies reactive hypoglycemic symptoms, visual blurring, diabetic polys or paresthesias. Last A1c was 5.5% on 06/27/2013.   Finally, patient has history of Vitamin D Deficiency of 30 in 2008 and last vitamin D was 75 on 03/26/2013.  Medication Sig  . albuterol  HFA inhaler Inhale 1-2 puffs into the lungs every 6 (six) hours as needed   . aspirin 81 MG tablet Take 81 mg by mouth daily.  . cetirizine (ZYRTEC) 10 MG tablet Take 1 tablet (10 mg total) by mouth daily. One tab daily for allergies  . VITAMIN D 2000 UNITS CAPS Take 4,000 Units by mouth daily.  Marland Kitchen iATROVENT nasal spray Place 2 sprays into both nostrils 4 (four) times daily.  . MULTIVITAMIN WITH MINERALS Take 1 tablet by mouth every evening.   Marland Kitchen FISH OIL) 1000 MG Take 1 capsule by mouth every evening.   . pravastatin  40 MG tablet Take 1 tablet (40 mg total) by mouth at bedtime.  Marland Kitchen terazosin 10 MG capsule Take 10 mg by mouth daily.  [name] ELLIPTA) 62.5-25 INH  One inhalation daily  . vitamin C 500 MG Take 500 mg by mouth daily.   Allergies  Allergen Reactions  . Nitrofuran Derivatives Shortness  Of Breath  . Macrobid [Nitrofurantoin Macrocrystal]     Headache   Past Medical History  Diagnosis Date  . BPH (benign prostatic hypertrophy)   . Hyperlipidemia   . COPD (chronic obstructive pulmonary disease)   . History of kidney stones   . Bladder tumor   . Impaired hearing bilateral aids  . History of lung cancer 2008  S/P RIGHT UPPER LOPECTOMY     STAGE I  NON-SMALL CELL LUNG CANCER--  ONCOLOGIST- DR MOHAMED-   NO RECURRENCE  . Hypertension   . Unspecified vitamin D deficiency   . Cancer     bladder, prostate, lung   Past Surgical History  Procedure Laterality Date  . Lung removal, partial  2008  . Lithotripsy  2012  . Fiberoptic bronchoscopy  10-28-2008  . Right upper lung lobectomy  09-21-2006  DR BURNEY    NON-SMALL CELL CANCER/ SEVERE COPD  . Transurethral resection of prostate  12/25/2011    Procedure: TRANSURETHRAL RESECTION OF THE PROSTATE WITH GYRUS INSTRUMENTS;  Surgeon: Franchot Gallo, MD;  Location: [facility];  Service: Urology;  Laterality: N/A;  2 HRS   . Transurethral resection of bladder tumor  12/25/2011    Procedure: TRANSURETHRAL RESECTION OF BLADDER TUMOR (TURBT);  Surgeon: Franchot Gallo, MD;  Location: [facility];  Service: Urology;  Laterality: N/A;  . Prostate surgery      prostatectomy   Family History  Problem Relation Age of  Onset  . Cancer Mother     Non-Hodgkin's Lymphoma  . Diabetes Mother   . Hypertension Mother   . Cancer Father     Colon cancer w/ metastasis to lung & bone  . Cancer Brother 73    Lung cancer  . Diabetes Maternal Grandmother   . Heart failure Maternal Grandmother   . Cancer Brother 65    Non-small cell Lung cancer  . Pneumonia Maternal Grandfather    History   Social History  . Marital Status: Single    Spouse Name: N/A    Number of Children: N/A  . Years of Education: N/A   Occupational History  . Not on file.   Social History Main Topics  . Smoking status: Former  Smoker -- 1.00 packs/day for 35 years    Types: Cigarettes    Quit date: 02/07/2004  . Smokeless tobacco: Never Used  . Alcohol Use: Yes     Comment: socially  . Drug Use: No  . Sexual Activity: Not on file    ROS Constitutional: Denies fever, chills, weight loss/gain, headaches, insomnia, fatigue, night sweats or change in appetite. Eyes: Denies redness, blurred vision, diplopia, discharge, itchy or watery eyes.  ENT: Denies discharge, congestion, post nasal drip, epistaxis, sore throat, earache, hearing loss, dental pain, Tinnitus, Vertigo, Sinus pain or snoring.  Cardio: Denies chest pain, palpitations, irregular heartbeat, syncope, dyspnea, diaphoresis, orthopnea, PND, claudication or edema Respiratory: denies cough, dyspnea, DOE, pleurisy, hoarseness, laryngitis or wheezing.  Gastrointestinal: Denies dysphagia, heartburn, reflux, water brash, pain, cramps, nausea, vomiting, bloating, diarrhea, constipation, hematemesis, melena, hematochezia, jaundice or hemorrhoids Genitourinary: Denies dysuria, frequency, urgency, nocturia, hesitancy, discharge, hematuria or flank pain Musculoskeletal: Denies arthralgia, myalgia, stiffness, Jt. Swelling, pain, limp or strain/sprain. Denies Falls. Skin: Denies puritis, rash, hives, warts, acne, eczema or change in skin lesion Neuro: No weakness, tremor, incoordination, spasms, paresthesia or pain Psychiatric: Denies confusion, memory loss or sensory loss. Denies Depression. Endocrine: Denies change in weight, skin, hair change, nocturia, and paresthesia, diabetic polys, visual blurring or hyper / hypo glycemic episodes.  Heme/Lymph: No excessive bleeding, bruising or enlarged lymph nodes.   Physical Exam  BP 126/68  Pulse 60  Temp(Src) 97.5 F (36.4 C) (Temporal)  Resp 16  Ht 5' 4.25" (1.632 m)  Wt 179 lb 6.4 oz (81.375 kg)  BMI 30.55 kg/m2  General Appearance: Well nourished, in no apparent distress. Eyes: PERRLA, EOMs, conjunctiva no  swelling or erythema, normal fundi and vessels. Sinuses: No frontal/maxillary tenderness ENT/Mouth: EACs patent / TMs  nl. Nares clear without erythema, swelling, mucoid exudates. Oral hygiene is good. No erythema, swelling, or exudate. Tongue normal, non-obstructing. Tonsils not swollen or erythematous. Hearing normal.  Neck: Supple, thyroid normal. No bruits, nodes or JVD. Respiratory: Respiratory effort normal.  BS equal and clear bilateral without rales, rhonci, wheezing or stridor. Cardio: Heart sounds are normal with regular rate and rhythm and no murmurs, rubs or gallops. Peripheral pulses are normal and equal bilaterally without edema. No aortic or femoral bruits. Chest: symmetric with normal excursions and percussion.  Abdomen: Flat, soft, with bowl sounds. Nontender, no guarding, rebound, hernias, masses, or organomegaly.  Lymphatics: Non tender without lymphadenopathy.  Genitourinary: No hernias.Testes nl. DRE - prostate nl for age - smooth & firm w/o nodules. Musculoskeletal: Full ROM all peripheral extremities, joint stability, 5/5 strength, and normal gait. Skin: Warm and dry without rashes, lesions, cyanosis, clubbing or  ecchymosis.  Neuro: Cranial nerves intact, reflexes equal bilaterally. Normal muscle tone, no cerebellar  symptoms. Sensation intact.  Pysch: Awake and oriented X 3with normal affect, insight and judgment appropriate.  Assessment and Plan  1. Annual Screening Examination 2. Hypertension  3. Hyperlipidemia 4. Pre Diabetes 5. Vitamin D Deficiency 6. Lung Cancer, Hx (2008)  Continue prudent diet as discussed, weight control, BP monitoring, regular exercise, and medications as discussed.  Discussed med effects and SE's. Routine screening labs and tests as requested with regular follow-up as recommended.

## 2013-09-30 LAB — URINALYSIS, MICROSCOPIC ONLY
BACTERIA UA: NONE SEEN
Casts: NONE SEEN
Crystals: NONE SEEN
SQUAMOUS EPITHELIAL / LPF: NONE SEEN

## 2013-09-30 LAB — BASIC METABOLIC PANEL WITH GFR
BUN: 11 mg/dL (ref 6–23)
CALCIUM: 9.5 mg/dL (ref 8.4–10.5)
CO2: 26 mEq/L (ref 19–32)
CREATININE: 0.94 mg/dL (ref 0.50–1.35)
Chloride: 105 mEq/L (ref 96–112)
GFR, EST NON AFRICAN AMERICAN: 79 mL/min
GFR, Est African American: >89 mL/min
Glucose, Bld: 95 mg/dL (ref 70–99)
Potassium: 4.1 mEq/L (ref 3.5–5.3)
Sodium: 139 mEq/L (ref 135–145)

## 2013-09-30 LAB — HEPATIC FUNCTION PANEL
ALBUMIN: 4.4 g/dL (ref 3.5–5.2)
ALT: 17 U/L (ref 0–53)
AST: 20 U/L (ref 0–37)
Alkaline Phosphatase: 75 U/L (ref 39–117)
Bilirubin, Direct: 0.2 mg/dL (ref 0.0–0.3)
Indirect Bilirubin: 0.6 mg/dL (ref 0.2–1.2)
TOTAL PROTEIN: 6.7 g/dL (ref 6.0–8.3)
Total Bilirubin: 0.8 mg/dL (ref 0.2–1.2)

## 2013-09-30 LAB — MICROALBUMIN / CREATININE URINE RATIO
Creatinine, Urine: 20.3 mg/dL
MICROALB/CREAT RATIO: 28.1 mg/g (ref 0.0–30.0)
Microalb, Ur: 0.57 mg/dL (ref 0.00–1.89)

## 2013-09-30 LAB — LIPID PANEL
Cholesterol: 132 mg/dL (ref 0–200)
HDL: 39 mg/dL — ABNORMAL LOW (ref >39)
LDL Cholesterol: 70 mg/dL (ref 0–99)
TRIGLYCERIDES: 116 mg/dL (ref <150)
Total CHOL/HDL Ratio: 3.4 Ratio
VLDL: 23 mg/dL (ref 0–40)

## 2013-09-30 LAB — PSA: PSA: 0.41 ng/mL (ref <=4.00)

## 2013-09-30 LAB — MAGNESIUM: Magnesium: 2 mg/dL (ref 1.5–2.5)

## 2013-09-30 LAB — INSULIN, FASTING: INSULIN FASTING, SERUM: 6.1 u[IU]/mL (ref 2.0–19.6)

## 2013-09-30 LAB — VITAMIN D 25 HYDROXY (VIT D DEFICIENCY, FRACTURES): Vit D, 25-Hydroxy: 83 ng/mL (ref 30–89)

## 2013-09-30 LAB — TSH: TSH: 1.181 u[IU]/mL (ref 0.350–4.500)

## 2013-10-20 ENCOUNTER — Other Ambulatory Visit (HOSPITAL_BASED_OUTPATIENT_CLINIC_OR_DEPARTMENT_OTHER): Payer: Medicare Other

## 2013-10-20 ENCOUNTER — Telehealth: Payer: Self-pay | Admitting: Internal Medicine

## 2013-10-20 ENCOUNTER — Other Ambulatory Visit: Payer: Medicare Other

## 2013-10-20 DIAGNOSIS — C3490 Malignant neoplasm of unspecified part of unspecified bronchus or lung: Secondary | ICD-10-CM | POA: Diagnosis not present

## 2013-10-20 LAB — CBC WITH DIFFERENTIAL/PLATELET
BASO%: 0.4 % (ref 0.0–2.0)
Basophils Absolute: 0 10*3/uL (ref 0.0–0.1)
EOS%: 1.3 % (ref 0.0–7.0)
Eosinophils Absolute: 0.1 10*3/uL (ref 0.0–0.5)
HCT: 41.8 % (ref 38.4–49.9)
HGB: 14.3 g/dL (ref 13.0–17.1)
LYMPH%: 35 % (ref 14.0–49.0)
MCH: 32.2 pg (ref 27.2–33.4)
MCHC: 34.2 g/dL (ref 32.0–36.0)
MCV: 94.1 fL (ref 79.3–98.0)
MONO#: 0.6 10*3/uL (ref 0.1–0.9)
MONO%: 8.2 % (ref 0.0–14.0)
NEUT#: 3.7 10*3/uL (ref 1.5–6.5)
NEUT%: 55.1 % (ref 39.0–75.0)
NRBC: 0 % (ref 0–0)
Platelets: 176 10*3/uL (ref 140–400)
RBC: 4.44 10*6/uL (ref 4.20–5.82)
RDW: 12.8 % (ref 11.0–14.6)
WBC: 6.7 10*3/uL (ref 4.0–10.3)
lymph#: 2.4 10*3/uL (ref 0.9–3.3)

## 2013-10-20 LAB — COMPREHENSIVE METABOLIC PANEL (CC13)
ALBUMIN: 3.8 g/dL (ref 3.5–5.0)
ALK PHOS: 76 U/L (ref 40–150)
ALT: 20 U/L (ref 0–55)
AST: 22 U/L (ref 5–34)
Anion Gap: 9 mEq/L (ref 3–11)
BILIRUBIN TOTAL: 0.47 mg/dL (ref 0.20–1.20)
BUN: 12.6 mg/dL (ref 7.0–26.0)
CO2: 24 mEq/L (ref 22–29)
Calcium: 9.2 mg/dL (ref 8.4–10.4)
Chloride: 112 mEq/L — ABNORMAL HIGH (ref 98–109)
Creatinine: 0.9 mg/dL (ref 0.7–1.3)
Glucose: 92 mg/dl (ref 70–140)
Potassium: 4.1 mEq/L (ref 3.5–5.1)
SODIUM: 144 meq/L (ref 136–145)
TOTAL PROTEIN: 6.8 g/dL (ref 6.4–8.3)

## 2013-10-20 NOTE — Telephone Encounter (Signed)
pt called to r/s missed appt...done....pt ok adn aware

## 2013-10-22 ENCOUNTER — Ambulatory Visit (HOSPITAL_COMMUNITY)
Admission: RE | Admit: 2013-10-22 | Discharge: 2013-10-22 | Disposition: A | Discharge status: Home / Self Care | Payer: Medicare Other | Source: Ambulatory Visit | Attending: Internal Medicine | Admitting: Internal Medicine

## 2013-10-22 ENCOUNTER — Ambulatory Visit (HOSPITAL_BASED_OUTPATIENT_CLINIC_OR_DEPARTMENT_OTHER): Payer: Medicare Other | Admitting: Internal Medicine

## 2013-10-22 ENCOUNTER — Telehealth: Payer: Self-pay | Admitting: Internal Medicine

## 2013-10-22 ENCOUNTER — Encounter: Payer: Self-pay | Admitting: Internal Medicine

## 2013-10-22 ENCOUNTER — Other Ambulatory Visit: Payer: Self-pay | Admitting: Internal Medicine

## 2013-10-22 VITALS — BP 119/76 | HR 73 | Temp 97.9°F | Resp 18 | Ht 64.25 in | Wt 181.3 lb

## 2013-10-22 DIAGNOSIS — Z85118 Personal history of other malignant neoplasm of bronchus and lung: Secondary | ICD-10-CM | POA: Insufficient documentation

## 2013-10-22 DIAGNOSIS — C3490 Malignant neoplasm of unspecified part of unspecified bronchus or lung: Secondary | ICD-10-CM

## 2013-10-22 DIAGNOSIS — Z9889 Other specified postprocedural states: Secondary | ICD-10-CM | POA: Diagnosis not present

## 2013-10-22 NOTE — Telephone Encounter (Signed)
Pt confirmed labs/ov per 09/16 POF, gave pt AVS....KJ

## 2013-10-22 NOTE — Progress Notes (Signed)
[place] [phone]   Fax:([phone]) [place], MD [place] [address]  DIAGNOSIS: Stage IA (T1a., N0, MX) non-small cell lung cancer, adenocarcinoma diagnosed in July 2008.   PRIOR THERAPY: Status post wedge resection of the right upper lobe with lymph node biopsy under the care of Dr. Arlyce Dice on 09/21/2006   CURRENT THERAPY: Observation.  INTERVAL HISTORY: Raymond Patrick 78 y.o. male returns to the clinic today for annual followup visit. The patient is feeling fine today with no specific complaints. He denied having any significant chest pain, shortness of breath, cough or hemoptysis. He denied having any weight loss or night sweats. The patient had chest x-ray by his primary care physician 6 months ago and showed no significant abnormality.  He has repeat CBC and comprehensive metabolic panel performed recently and he is here for evaluation and discussion of his lab results.  MEDICAL HISTORY: Past Medical History  Diagnosis Date  . BPH (benign prostatic hypertrophy)   . Hyperlipidemia   . COPD (chronic obstructive pulmonary disease)   . History of kidney stones   . Bladder tumor   . Impaired hearing bilateral aids  . History of lung cancer 2008  S/P RIGHT UPPER LOPECTOMY     STAGE I  NON-SMALL CELL LUNG CANCER--  ONCOLOGIST- DR Tanyia Grabbe-   NO RECURRENCE  . Hypertension   . Unspecified vitamin D deficiency   . Cancer     bladder, prostate, lung    ALLERGIES:  is allergic to nitrofuran derivatives and macrobid.  MEDICATIONS:  Current Outpatient Prescriptions  Medication Sig Dispense Refill  . albuterol (PROVENTIL HFA;VENTOLIN HFA) 108 (90 BASE) MCG/ACT inhaler Inhale 1-2 puffs into the lungs every 6 (six) hours as needed for wheezing or shortness of breath.  18 g  2  . aspirin 81 MG tablet Take 81 mg by mouth daily.      . cetirizine (ZYRTEC) 10 MG tablet Take 1  tablet (10 mg total) by mouth daily. One tab daily for allergies  30 tablet  1  . Cholecalciferol (VITAMIN D) 2000 UNITS CAPS Take 4,000 Units by mouth daily.      Marland Kitchen ipratropium (ATROVENT) 0.06 % nasal spray Place 2 sprays into both nostrils 4 (four) times daily.  15 mL  12  . Multiple Vitamin (MULTIVITAMIN WITH MINERALS) TABS Take 1 tablet by mouth every evening.       . Omega-3 Fatty Acids (FISH OIL) 1000 MG CAPS Take 1 capsule by mouth every evening.       . pravastatin (PRAVACHOL) 40 MG tablet Take 1 tablet (40 mg total) by mouth at bedtime.  90 tablet  3  . terazosin (HYTRIN) 10 MG capsule Take 10 mg by mouth daily.      Marland Kitchen Umeclidinium-Vilanterol (ANORO ELLIPTA) 62.5-25 MCG/INH AEPB One inhalation daily  60 each  0  . vitamin C (ASCORBIC ACID) 500 MG tablet Take 500 mg by mouth daily.       No current facility-administered medications for this visit.    SURGICAL HISTORY:  Past Surgical History  Procedure Laterality Date  . Lung removal, partial  2008  . Lithotripsy  2012  . Fiberoptic bronchoscopy  10-28-2008  . Right upper lung lobectomy  09-21-2006  DR BURNEY    NON-SMALL CELL CANCER/ SEVERE COPD  . Transurethral resection of prostate  12/25/2011    Procedure: TRANSURETHRAL RESECTION OF THE PROSTATE WITH GYRUS  INSTRUMENTS;  Surgeon: Franchot Gallo, MD;  Location: [facility];  Service: Urology;  Laterality: N/A;  2 HRS   . Transurethral resection of bladder tumor  12/25/2011    Procedure: TRANSURETHRAL RESECTION OF BLADDER TUMOR (TURBT);  Surgeon: Franchot Gallo, MD;  Location: [facility];  Service: Urology;  Laterality: N/A;  . Prostate surgery      prostatectomy    REVIEW OF SYSTEMS:  A comprehensive review of systems was negative.   PHYSICAL EXAMINATION: General appearance: alert, cooperative and no distress Head: Normocephalic, without obvious abnormality, atraumatic Neck: no adenopathy Lymph nodes: Cervical, supraclavicular, and  axillary nodes normal. Resp: clear to auscultation bilaterally Cardio: regular rate and rhythm, S1, S2 normal, no murmur, click, rub or gallop GI: soft, non-tender; bowel sounds normal; no masses,  no organomegaly Extremities: extremities normal, atraumatic, no cyanosis or edema  ECOG PERFORMANCE STATUS: 1 - Symptomatic but completely ambulatory  Blood pressure 119/76, pulse 73, temperature 97.9 F (36.6 C), temperature source Oral, resp. rate 18, height 5' 4.25" (1.632 m), weight 181 lb 4.8 oz (82.237 kg).  LABORATORY DATA: Lab Results  Component Value Date   WBC 6.7 10/20/2013   HGB 14.3 10/20/2013   HCT 41.8 10/20/2013   MCV 94.1 10/20/2013   PLT 176 10/20/2013      Chemistry      Component Value Date/Time   NA 144 10/20/2013 1554   NA 139 09/29/2013 1232   NA 141 10/05/2010 0918   K 4.1 10/20/2013 1554   K 4.1 09/29/2013 1232   K 4.3 10/05/2010 0918   CL 105 09/29/2013 1232   CL 108* 10/16/2011 0828   CL 102 10/05/2010 0918   CO2 24 10/20/2013 1554   CO2 26 09/29/2013 1232   CO2 29 10/05/2010 0918   BUN 12.6 10/20/2013 1554   BUN 11 09/29/2013 1232   BUN 11 10/05/2010 0918   CREATININE 0.9 10/20/2013 1554   CREATININE 0.94 09/29/2013 1232   CREATININE 1.12 08/31/2011 0708      Component Value Date/Time   CALCIUM 9.2 10/20/2013 1554   CALCIUM 9.5 09/29/2013 1232   CALCIUM 9.2 10/05/2010 0918   ALKPHOS 76 10/20/2013 1554   ALKPHOS 75 09/29/2013 1232   ALKPHOS 83 10/05/2010 0918   AST 22 10/20/2013 1554   AST 20 09/29/2013 1232   AST 31 10/05/2010 0918   ALT 20 10/20/2013 1554   ALT 17 09/29/2013 1232   ALT 28 10/05/2010 0918   BILITOT 0.47 10/20/2013 1554   BILITOT 0.8 09/29/2013 1232   BILITOT 0.80 10/05/2010 0918       RADIOGRAPHIC STUDIES:  ASSESSMENT AND PLAN: this is a very pleasant 78 years old white male with history of stage IA non-small cell lung cancer status post wedge resection of the right upper lobe and has been observation since August of 2008 with no evidence for disease  recurrence. I discussed the scan results with the patient today.  I recommended for him to continue on observation with repeat chest x-ray today as well as repeat chest x-ray and blood work in one year. He would come back for followup visit at that time. He was advised to call immediately if he has any concerning symptoms in the interval. The patient voices understanding of current disease status and treatment options and is in agreement with the current care plan.  All questions were answered. The patient knows to call the clinic with any problems, questions or concerns. We can certainly see the  patient much sooner if necessary.  Disclaimer: This note was dictated with voice recognition software. Similar sounding words can inadvertently be transcribed and may be missed upon review.

## 2013-12-01 ENCOUNTER — Other Ambulatory Visit (INDEPENDENT_AMBULATORY_CARE_PROVIDER_SITE_OTHER): Payer: Medicare Other | Admitting: *Deleted

## 2013-12-01 ENCOUNTER — Other Ambulatory Visit: Payer: Self-pay | Admitting: *Deleted

## 2013-12-01 DIAGNOSIS — Z23 Encounter for immunization: Secondary | ICD-10-CM | POA: Diagnosis not present

## 2013-12-01 DIAGNOSIS — I159 Secondary hypertension, unspecified: Secondary | ICD-10-CM

## 2013-12-01 DIAGNOSIS — Z1212 Encounter for screening for malignant neoplasm of rectum: Secondary | ICD-10-CM

## 2013-12-01 LAB — POC HEMOCCULT BLD/STL (HOME/3-CARD/SCREEN)
Card #2 Fecal Occult Blod, POC: NEGATIVE
Card #3 Fecal Occult Blood, POC: NEGATIVE
FECAL OCCULT BLD: NEGATIVE

## 2013-12-01 MED ORDER — TERAZOSIN HCL 10 MG PO CAPS: 10.0000 mg | ORAL_CAPSULE | Freq: Every day | ORAL | Status: DC

## 2013-12-22 ENCOUNTER — Encounter: Payer: Self-pay | Admitting: Physician Assistant

## 2013-12-22 ENCOUNTER — Ambulatory Visit (INDEPENDENT_AMBULATORY_CARE_PROVIDER_SITE_OTHER): Payer: Medicare Other | Admitting: Physician Assistant

## 2013-12-22 VITALS — BP 130/80 | HR 92 | Temp 98.1°F | Resp 18 | Wt 186.2 lb

## 2013-12-22 DIAGNOSIS — J209 Acute bronchitis, unspecified: Secondary | ICD-10-CM | POA: Diagnosis not present

## 2013-12-22 DIAGNOSIS — T24002A Burn of unspecified degree of unspecified site of left lower limb, except ankle and foot, initial encounter: Secondary | ICD-10-CM

## 2013-12-22 MED ORDER — AZITHROMYCIN 250 MG PO TABS: ORAL_TABLET | ORAL | Status: DC

## 2013-12-22 MED ORDER — BENZONATATE 100 MG PO CAPS: 200.0000 mg | ORAL_CAPSULE | Freq: Three times a day (TID) | ORAL | Status: DC | PRN

## 2013-12-22 MED ORDER — PREDNISONE 10 MG PO TABS: ORAL_TABLET | ORAL | Status: AC

## 2013-12-22 NOTE — Patient Instructions (Addendum)
-   Rest and stay hydrated.  Make sure you drink plenty of fluids to make sure urine is clear when you urinate.  Water will help thin out mucous. - Take Mucinex over the counter to thin out and cough up the thick mucous.  Please follow directions on box. -Take Albuterol as prescribed to help open up airways. -Take Z-Pak as prescribed.   -Take tessalon perles for cough as prescribed. -Take the prednisone as prescribed for inflammation.  Make sure you pay attention to what you are eating as it can cause weight gain.    -Left Leg Wound  -Apply vasoline on wound and wrap in plastic wrap and use tape to hold in place.   -When scab comes off, make appt that day to see [place] to get medication.  -If you notice the redness spreading or develop fever, chills, swelling, more pain during  movement, night sweats, SOB, CP abdominal pain, nausea, vomiting, then go to the ER  immediately.  If you are not better in 10-14 days, then please call the office.   Please keep appt on 01/20/14 for follow up.   Acute Bronchitis Bronchitis is when the airways that extend from the windpipe into the lungs get red, puffy, and painful (inflamed). Bronchitis often causes thick spit (mucus) to develop. This leads to a cough. A cough is the most common symptom of bronchitis. In acute bronchitis, the condition usually begins suddenly and goes away over time (usually in 2 weeks). Smoking, allergies, and asthma can make bronchitis worse. Repeated episodes of bronchitis may cause more lung problems.  Most common cause of Bronchitis is viruses (rhinovirus, coronavirus, RSV).  Therefore, not requiring an antibiotic; as antibiotics only treat bacterial infections.  HOME CARE  Rest.  Drink enough fluids to keep your pee (urine) clear or pale yellow (unless you need to limit fluids as told by your doctor).  Only take over-the-counter or prescription medicines as told by your doctor.  Avoid smoking and secondhand smoke. These can make  bronchitis worse. If you are a smoker, think about using nicotine gum or skin patches. Quitting smoking will help your lungs heal faster.  Reduce the chance of getting bronchitis again by:  Washing your hands often.  Avoiding people with cold symptoms.  Trying not to touch your hands to your mouth, nose, or eyes.  Follow up with your doctor as told. GET HELP IF: Your symptoms do not improve after 1 week of treatment. Symptoms include:  Cough.  Fever.  Coughing up thick spit.  Body aches.  Chest congestion.  Chills.  Shortness of breath.  Sore throat. GET HELP RIGHT AWAY IF:   You have an increased fever.  You have chills.  You have severe shortness of breath.  You have bloody thick spit (sputum).  You throw up (vomit) often.  You lose too much body fluid (dehydration).  You have a severe headache.  You faint. MAKE SURE YOU:   Understand these instructions.  Will watch your condition.  Will get help right away if you are not doing well or get worse. Document Released: 07/12/2007 Document Revised: 09/25/2012 Document Reviewed: 07/16/2012 [facility] Patient Information 2015 [place], [place]. This information is not intended to replace advice given to you by your health care provider. Make sure you discuss any questions you have with your health care provider.
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
## 2013-12-30 ENCOUNTER — Encounter: Payer: Self-pay | Admitting: Physician Assistant

## 2013-12-30 ENCOUNTER — Ambulatory Visit (INDEPENDENT_AMBULATORY_CARE_PROVIDER_SITE_OTHER): Payer: Medicare Other | Admitting: Physician Assistant

## 2013-12-30 VITALS — BP 118/62 | HR 88 | Temp 98.0°F | Resp 16 | Ht 65.0 in | Wt 183.0 lb

## 2013-12-30 DIAGNOSIS — T24109D Burn of first degree of unspecified site of unspecified lower limb, except ankle and foot, subsequent encounter: Secondary | ICD-10-CM

## 2013-12-30 DIAGNOSIS — J209 Acute bronchitis, unspecified: Secondary | ICD-10-CM

## 2013-12-30 MED ORDER — SILVER SULFADIAZINE 1 % EX CREA: 1.0000 "application " | TOPICAL_CREAM | Freq: Two times a day (BID) | CUTANEOUS | Status: DC
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
## 2014-01-14 DIAGNOSIS — C670 Malignant neoplasm of trigone of bladder: Secondary | ICD-10-CM | POA: Diagnosis not present

## 2014-01-20 ENCOUNTER — Ambulatory Visit (INDEPENDENT_AMBULATORY_CARE_PROVIDER_SITE_OTHER): Payer: Medicare Other | Admitting: Physician Assistant

## 2014-01-20 ENCOUNTER — Encounter: Payer: Self-pay | Admitting: Physician Assistant

## 2014-01-20 VITALS — BP 120/68 | HR 60 | Temp 97.7°F | Resp 16 | Ht 65.0 in | Wt 182.0 lb

## 2014-01-20 DIAGNOSIS — E669 Obesity, unspecified: Secondary | ICD-10-CM

## 2014-01-20 DIAGNOSIS — Z85118 Personal history of other malignant neoplasm of bronchus and lung: Secondary | ICD-10-CM | POA: Diagnosis not present

## 2014-01-20 DIAGNOSIS — Z1331 Encounter for screening for depression: Secondary | ICD-10-CM

## 2014-01-20 DIAGNOSIS — E559 Vitamin D deficiency, unspecified: Secondary | ICD-10-CM | POA: Diagnosis not present

## 2014-01-20 DIAGNOSIS — R7309 Other abnormal glucose: Secondary | ICD-10-CM | POA: Diagnosis not present

## 2014-01-20 DIAGNOSIS — E782 Mixed hyperlipidemia: Secondary | ICD-10-CM

## 2014-01-20 DIAGNOSIS — R7303 Prediabetes: Secondary | ICD-10-CM

## 2014-01-20 DIAGNOSIS — Z0001 Encounter for general adult medical examination with abnormal findings: Secondary | ICD-10-CM

## 2014-01-20 DIAGNOSIS — R6889 Other general symptoms and signs: Secondary | ICD-10-CM | POA: Diagnosis not present

## 2014-01-20 DIAGNOSIS — Z23 Encounter for immunization: Secondary | ICD-10-CM | POA: Diagnosis not present

## 2014-01-20 DIAGNOSIS — J449 Chronic obstructive pulmonary disease, unspecified: Secondary | ICD-10-CM

## 2014-01-20 DIAGNOSIS — Z9181 History of falling: Secondary | ICD-10-CM

## 2014-01-20 DIAGNOSIS — I10 Essential (primary) hypertension: Secondary | ICD-10-CM | POA: Diagnosis not present

## 2014-01-20 DIAGNOSIS — Z79899 Other long term (current) drug therapy: Secondary | ICD-10-CM

## 2014-01-20 DIAGNOSIS — Z902 Acquired absence of lung [part of]: Secondary | ICD-10-CM

## 2014-01-20 LAB — CBC WITH DIFFERENTIAL/PLATELET
BASOS PCT: 1 % (ref 0–1)
Basophils Absolute: 0.1 10*3/uL (ref 0.0–0.1)
EOS PCT: 2 % (ref 0–5)
Eosinophils Absolute: 0.1 10*3/uL (ref 0.0–0.7)
HEMATOCRIT: 40.3 % (ref 39.0–52.0)
Hemoglobin: 14.1 g/dL (ref 13.0–17.0)
LYMPHS PCT: 42 % (ref 12–46)
Lymphs Abs: 2.9 10*3/uL (ref 0.7–4.0)
MCH: 31.7 pg (ref 26.0–34.0)
MCHC: 35 g/dL (ref 30.0–36.0)
MCV: 90.6 fL (ref 78.0–100.0)
MONO ABS: 0.6 10*3/uL (ref 0.1–1.0)
MONOS PCT: 8 % (ref 3–12)
MPV: 10.2 fL (ref 9.4–12.4)
NEUTROS ABS: 3.3 10*3/uL (ref 1.7–7.7)
Neutrophils Relative %: 47 % (ref 43–77)
Platelets: 210 10*3/uL (ref 150–400)
RBC: 4.45 MIL/uL (ref 4.22–5.81)
RDW: 13.7 % (ref 11.5–15.5)
WBC: 7 10*3/uL (ref 4.0–10.5)

## 2014-01-20 LAB — BASIC METABOLIC PANEL WITH GFR
BUN: 12 mg/dL (ref 6–23)
CHLORIDE: 106 meq/L (ref 96–112)
CO2: 25 mEq/L (ref 19–32)
CREATININE: 0.92 mg/dL (ref 0.50–1.35)
Calcium: 9.1 mg/dL (ref 8.4–10.5)
GFR, Est African American: >89 mL/min
GFR, Est Non African American: 81 mL/min
GLUCOSE: 90 mg/dL (ref 70–99)
Potassium: 4.2 mEq/L (ref 3.5–5.3)
Sodium: 141 mEq/L (ref 135–145)

## 2014-01-20 LAB — LIPID PANEL
Cholesterol: 138 mg/dL (ref 0–200)
HDL: 40 mg/dL (ref >39)
LDL CALC: 74 mg/dL (ref 0–99)
Total CHOL/HDL Ratio: 3.5 Ratio
Triglycerides: 120 mg/dL (ref <150)
VLDL: 24 mg/dL (ref 0–40)

## 2014-01-20 LAB — TSH: TSH: 1.089 u[IU]/mL (ref 0.350–4.500)

## 2014-01-20 LAB — HEMOGLOBIN A1C
HEMOGLOBIN A1C: 5.9 % — AB (ref <5.7)
MEAN PLASMA GLUCOSE: 123 mg/dL — AB (ref <117)

## 2014-01-20 LAB — HEPATIC FUNCTION PANEL
ALK PHOS: 69 U/L (ref 39–117)
ALT: 20 U/L (ref 0–53)
AST: 21 U/L (ref 0–37)
Albumin: 4.2 g/dL (ref 3.5–5.2)
Bilirubin, Direct: 0.1 mg/dL (ref 0.0–0.3)
Indirect Bilirubin: 0.7 mg/dL (ref 0.2–1.2)
TOTAL PROTEIN: 6.4 g/dL (ref 6.0–8.3)
Total Bilirubin: 0.8 mg/dL (ref 0.2–1.2)

## 2014-01-20 LAB — MAGNESIUM: Magnesium: 2.1 mg/dL (ref 1.5–2.5)

## 2014-01-20 NOTE — Patient Instructions (Signed)
Preventative Care for Adults, Male       REGULAR HEALTH EXAMS:  A routine yearly physical is a good way to check in with your primary care provider about your health and preventive screening. It is also an opportunity to share updates about your health and any concerns you have, and receive a thorough all-over exam.   Most health insurance companies pay for at least some preventative services.  Check with your health plan for specific coverages.  WHAT PREVENTATIVE SERVICES DO MEN NEED?  Adult men should have their weight and blood pressure checked regularly.   Men age 35 and older should have their cholesterol levels checked regularly.  Beginning at age 50 and continuing to age 75, men should be screened for colorectal cancer.  Certain people should may need continued testing until age 85.  Other cancer screening may include exams for testicular and prostate cancer.  Updating vaccinations is part of preventative care.  Vaccinations help protect against diseases such as the flu.  Lab tests are generally done as part of preventative care to screen for anemia and blood disorders, to screen for problems with the kidneys and liver, to screen for bladder problems, to check blood sugar, and to check your cholesterol level.  Preventative services generally include counseling about diet, exercise, avoiding tobacco, drugs, excessive alcohol consumption, and sexually transmitted infections.    GENERAL RECOMMENDATIONS FOR GOOD HEALTH:  Healthy diet:  Eat a variety of foods, including fruit, vegetables, animal or vegetable protein, such as meat, fish, chicken, and eggs, or beans, lentils, tofu, and grains, such as rice.  Drink plenty of water daily.  Decrease saturated fat in the diet, avoid lots of red meat, processed foods, sweets, fast foods, and fried foods.  Exercise:  Aerobic exercise helps maintain good heart health. At least 30-40 minutes of moderate-intensity exercise is recommended.  For example, a brisk walk that increases your heart rate and breathing. This should be done on most days of the week.   Find a type of exercise or a variety of exercises that you enjoy so that it becomes a part of your daily life.  Examples are running, walking, swimming, water aerobics, and biking.  For motivation and support, explore group exercise such as aerobic class, spin class, Zumba, Yoga,or  martial arts, etc.    Set exercise goals for yourself, such as a certain weight goal, walk or run in a race such as a 5k walk/run.  Speak to your primary care provider about exercise goals.  Disease prevention:  If you smoke or chew tobacco, find out from your caregiver how to quit. It can literally save your life, no matter how long you have been a tobacco user. If you do not use tobacco, never begin.   Maintain a healthy diet and normal weight. Increased weight leads to problems with blood pressure and diabetes.   The Body Mass Index or BMI is a way of measuring how much of your body is fat. Having a BMI above 27 increases the risk of heart disease, diabetes, hypertension, stroke and other problems related to obesity. Your caregiver can help determine your BMI and based on it develop an exercise and dietary program to help you achieve or maintain this important measurement at a healthful level.  High blood pressure causes heart and blood vessel problems.  Persistent high blood pressure should be treated with medicine if weight loss and exercise do not work.   Fat and cholesterol leaves deposits in your arteries   that can block them. This causes heart disease and vessel disease elsewhere in your body.  If your cholesterol is found to be high, or if you have heart disease or certain other medical conditions, then you may need to have your cholesterol monitored frequently and be treated with medication.   Ask if you should have a stress test if your history suggests this. A stress test is a test done on  a treadmill that looks for heart disease. This test can find disease prior to there being a problem.  Avoid drinking alcohol in excess (more than two drinks per day).  Avoid use of street drugs. Do not share needles with anyone. Ask for professional help if you need assistance or instructions on stopping the use of alcohol, cigarettes, and/or drugs.  Brush your teeth twice a day with fluoride toothpaste, and floss once a day. Good oral hygiene prevents tooth decay and gum disease. The problems can be painful, unattractive, and can cause other health problems. Visit your dentist for a routine oral and dental check up and preventive care every 6-12 months.   Look at your skin regularly.  Use a mirror to look at your back. Notify your caregivers of changes in moles, especially if there are changes in shapes, colors, a size larger than a pencil eraser, an irregular border, or development of new moles.  Safety:  Use seatbelts 100% of the time, whether driving or as a passenger.  Use safety devices such as hearing protection if you work in environments with loud noise or significant background noise.  Use safety glasses when doing any work that could send debris in to the eyes.  Use a helmet if you ride a bike or motorcycle.  Use appropriate safety gear for contact sports.  Talk to your caregiver about gun safety.  Use sunscreen with a SPF (or skin protection factor) of 15 or greater.  Lighter skinned people are at a greater risk of skin cancer. Don't forget to also wear sunglasses in order to protect your eyes from too much damaging sunlight. Damaging sunlight can accelerate cataract formation.   Practice safe sex. Use condoms. Condoms are used for birth control and to help reduce the spread of sexually transmitted infections (or STIs).  Some of the STIs are gonorrhea (the clap), chlamydia, syphilis, trichomonas, herpes, HPV (human papilloma virus) and HIV (human immunodeficiency virus) which causes AIDS.  The herpes, HIV and HPV are viral illnesses that have no cure. These can result in disability, cancer and death.   Keep carbon monoxide and smoke detectors in your home functioning at all times. Change the batteries every 6 months or use a model that plugs into the wall.   Vaccinations:  Stay up to date with your tetanus shots and other required immunizations. You should have a booster for tetanus every 10 years. Be sure to get your flu shot every year, since 5%-20% of the [place] population comes down with the flu. The flu vaccine changes each year, so being vaccinated once is not enough. Get your shot in the fall, before the flu season peaks.   Other vaccines to consider:  Pneumococcal vaccine to protect against certain types of pneumonia.  This is normally recommended for adults age 65 or older.  However, adults younger than 76 years old with certain underlying conditions such as diabetes, heart or lung disease should also receive the vaccine.  Shingles vaccine to protect against Varicella Zoster if you are older than age 60, or younger   than 76 years old with certain underlying illness.  Hepatitis A vaccine to protect against a form of infection of the liver by a virus acquired from food.  Hepatitis B vaccine to protect against a form of infection of the liver by a virus acquired from blood or body fluids, particularly if you work in health care.  If you plan to travel internationally, check with your local health department for specific vaccination recommendations.  Cancer Screening:  Most routine colon cancer screening begins at the age of 50. On a yearly basis, doctors may provide special easy to use take-home tests to check for hidden blood in the stool. Sigmoidoscopy or colonoscopy can detect the earliest forms of colon cancer and is life saving. These tests use a small camera at the end of a tube to directly examine the colon. Speak to your caregiver about this at age 50, when routine  screening begins (and is repeated every 5 years unless early forms of pre-cancerous polyps or small growths are found).   At the age of 50 men usually start screening for prostate cancer every year. Screening may begin at a younger age for those with higher risk. Those at higher risk include African-Americans or having a family history of prostate cancer. There are two types of tests for prostate cancer:   Prostate-specific antigen (PSA) testing. Recent studies raise questions about prostate cancer using PSA and you should discuss this with your caregiver.   Digital rectal exam (in which your doctor's lubricated and gloved finger feels for enlargement of the prostate through the anus).   Screening for testicular cancer.  Do a monthly exam of your testicles. Gently roll each testicle between your thumb and fingers, feeling for any abnormal lumps. The best time to do this is after a hot shower or bath when the tissues are looser. Notify your caregivers of any lumps, tenderness or changes in size or shape immediately.     

## 2014-01-20 NOTE — Progress Notes (Signed)
MEDICARE ANNUAL WELLNESS VISIT AND FOLLOW UP Assessment:   1. Essential hypertension - continue medications, DASH diet, exercise and monitor at home. Call if greater than 130/80.  - CBC with Differential - BASIC METABOLIC PANEL WITH GFR - Hepatic function panel - TSH  2. S/P partial lobectomy of lung Continue follow up   3. History of lung cancer Continue follow up   4. Chronic obstructive pulmonary disease, unspecified COPD, unspecified chronic bronchitis type controlled  5. Prediabetes Discussed general issues about diabetes pathophysiology and management., Educational material distributed., Suggested low cholesterol diet., Encouraged aerobic exercise., Discussed foot care., Reminded to get yearly retinal exam. - Hemoglobin A1c - HM DIABETES FOOT EXAM  6. Hyperlipidemia -continue medications, check lipids, decrease fatty foods, increase activity.  - Lipid panel - Vit D  25 hydroxy (rtn osteoporosis monitoring)  7. Vitamin D deficiency  8. Medication management - Magnesium  9. Obesity Obesity with co morbidities- long discussion about weight loss, diet, and exercise  10. Encounter for general adult medical examination with abnormal findings  11. At low risk for fall  12. Need for prophylactic vaccination against Streptococcus pneumoniae (pneumococcus) - Pneumococcal conjugate vaccine 13-valent IM  13. Screening for depression negative   Plan:   During the course of the visit the patient was educated and counseled about appropriate screening and preventive services including:    Pneumococcal vaccine   Influenza vaccine  Td vaccine  Screening electrocardiogram  Colorectal cancer screening  Diabetes screening  Glaucoma screening  Nutrition counseling   Screening recommendations, referrals: Vaccinations: Please see documentation below and orders this visit.  Nutrition assessed and recommended  Colonoscopy due 2016 Recommended yearly  ophthalmology/optometry visit for glaucoma screening and checkup Recommended yearly dental visit for hygiene and checkup Advanced directives - requested  Conditions/risks identified: BMI: Discussed weight loss, diet, and increase physical activity.  Increase physical activity: AHA recommends 150 minutes of physical activity a week.  Medications reviewed Diabetes is at goal, ACE/ARB therapy: No, Reason not on Ace Inhibitor/ARB therapy:  preDM Urinary Incontinence is not an issue: discussed non pharmacology and pharmacology options.  Fall risk: low- discussed PT, home fall assessment, medications.    Subjective:  Raymond Patrick is a 78 y.o. male who presents for Medicare Annual Wellness Visit and 3 month follow up for HTN, hyperlipidemia, prediabetes, and vitamin D Def.  Date of last medicare wellness visit was is unknown.  His blood pressure has been controlled at home, today their BP is BP: 120/68 mmHg He does workout, line dance and goes to the gym but since recent bronchitis has not started back at the gym. He denies chest pain, shortness of breath, dizziness.  He is on cholesterol medication, pravastatin 40mg  QD and denies myalgias. His cholesterol is at goal. The cholesterol last visit was:   Lab Results  Component Value Date   CHOL 132 09/29/2013   HDL 39* 09/29/2013   LDLCALC 70 09/29/2013   TRIG 116 09/29/2013   CHOLHDL 3.4 09/29/2013   He has been working on diet and exercise for prediabetes, and denies paresthesia of the feet, polydipsia, polyuria and visual disturbances. Last A1C in the office was:  Lab Results  Component Value Date   HGBA1C 5.6 09/29/2013   Patient is on Vitamin D supplement.   Lab Results  Component Value Date   VD25OH 55 09/29/2013     He had RUL lung cancer in 2008 s/p lobectomy and radiation, follows with [facility].  He has COPD- uses albuterol rarely.  Recent burn to his left leg that is healing well.  BMI is Body mass index is 30.29  kg/(m^2)., he is working on diet and exercise. Wt Readings from Last 3 Encounters:  01/20/14 182 lb (82.555 kg)  12/30/13 183 lb (83.008 kg)  12/22/13 186 lb 3.2 oz (84.46 kg)    Names of Other Physician/Practitioners you currently use: 1. [facility] here for primary care 2.  Dr. Barrie Dunker, dentist, last visit 3 months ago Patient Care Team: [name], MD as PCP - General (Internal Medicine) Jorja Loa, MD as Consulting Physician (Urology) Curt Bears, MD as Consulting Physician (Oncology) Lorretta Harp, MD as Consulting Physician (Cardiology) Inda Castle, MD as Consulting Physician (Gastroenterology) Melissa Noon, OD as Referring Physician (Optometry)  Medication Review: Current Outpatient Prescriptions on File Prior to Visit  Medication Sig Dispense Refill  . albuterol (PROVENTIL HFA;VENTOLIN HFA) 108 (90 BASE) MCG/ACT inhaler Inhale 1-2 puffs into the lungs every 6 (six) hours as needed for wheezing or shortness of breath. 18 g 2  . aspirin 81 MG tablet Take 81 mg by mouth daily.    . benzonatate (TESSALON PERLES) 100 MG capsule Take 2 capsules (200 mg total) by mouth 3 (three) times daily as needed for cough. 120 capsule 0  . cetirizine (ZYRTEC) 10 MG tablet Take 1 tablet (10 mg total) by mouth daily. One tab daily for allergies 30 tablet 1  . Cholecalciferol (VITAMIN D) 2000 UNITS CAPS Take 4,000 Units by mouth daily.    . Multiple Vitamin (MULTIVITAMIN WITH MINERALS) TABS Take 1 tablet by mouth every evening.     . Omega-3 Fatty Acids (FISH OIL) 1000 MG CAPS Take 1 capsule by mouth every evening.     . pravastatin (PRAVACHOL) 40 MG tablet Take 1 tablet (40 mg total) by mouth at bedtime. 90 tablet 3  . silver sulfADIAZINE (SILVADENE) 1 % cream Apply 1 application topically 2 (two) times daily. 50 g 2  . terazosin (HYTRIN) 10 MG capsule Take 1 capsule (10 mg total) by mouth daily. 90 capsule 0  . vitamin C (ASCORBIC ACID)  500 MG tablet Take 500 mg by mouth daily.     No current facility-administered medications on file prior to visit.    Current Problems (verified) Patient Active Problem List   Diagnosis Date Noted  . Encounter for long-term (current) use of other medications 03/25/2013  . Prediabetes 03/25/2013  . Hyperlipidemia   . COPD (chronic obstructive pulmonary disease)   . Hypertension   . Vitamin D Deficiency   . Malignant neoplasm of bronchus and lung, unspecified site 10/23/2011    Screening Tests Health Maintenance  Topic Date Due  . ZOSTAVAX  11/15/1997  . INFLUENZA VACCINE  09/06/2013  . COLONOSCOPY  05/10/2014  . TETANUS/TDAP  03/09/2020  . PNEUMOCOCCAL POLYSACCHARIDE VACCINE AGE 27 AND OVER  Completed    Immunization History  Administered Date(s) Administered  . Influenza-Unspecified 11/06/2012  . Pneumococcal-Unspecified 02/06/2002, 09/23/2012  . Tdap 03/09/2010    Preventative care: Last colonoscopy: 2006  CXR 10/2013  Prior vaccinations: TD or Tdap: 2012  Influenza: Oct/2015  Pneumococcal: 2014 Prevnar13: DUE TODAY Shingles/Zostavax:  Price 110 dollars    Medication List       This list is accurate as of: 01/20/14 11:03 AM.  Always use your most recent med list.               albuterol 108 (90 BASE) MCG/ACT inhaler  Commonly known as:  PROVENTIL HFA;VENTOLIN HFA  Inhale 1-2 puffs into the lungs every 6 (six) hours as needed for wheezing or shortness of breath.     aspirin 81 MG tablet  Take 81 mg by mouth daily.     benzonatate 100 MG capsule  Commonly known as:  TESSALON PERLES  Take 2 capsules (200 mg total) by mouth 3 (three) times daily as needed for cough.     cetirizine 10 MG tablet  Commonly known as:  ZYRTEC  Take 1 tablet (10 mg total) by mouth daily. One tab daily for allergies     Fish Oil 1000 MG Caps  Take 1 capsule by mouth every evening.     multivitamin with minerals Tabs tablet  Take 1 tablet by mouth every evening.      pravastatin 40 MG tablet  Commonly known as:  PRAVACHOL  Take 1 tablet (40 mg total) by mouth at bedtime.     silver sulfADIAZINE 1 % cream  Commonly known as:  SILVADENE  Apply 1 application topically 2 (two) times daily.     terazosin 10 MG capsule  Commonly known as:  HYTRIN  Take 1 capsule (10 mg total) by mouth daily.     vitamin C 500 MG tablet  Commonly known as:  ASCORBIC ACID  Take 500 mg by mouth daily.     Vitamin D 2000 UNITS Caps  Take 4,000 Units by mouth daily.        Past Surgical History  Procedure Laterality Date  . Lung removal, partial  2008  . Lithotripsy  2012  . Fiberoptic bronchoscopy  10-28-2008  . Right upper lung lobectomy  09-21-2006  DR BURNEY    NON-SMALL CELL CANCER/ SEVERE COPD  . Transurethral resection of prostate  12/25/2011    Procedure: TRANSURETHRAL RESECTION OF THE PROSTATE WITH GYRUS INSTRUMENTS;  Surgeon: Franchot Gallo, MD;  Location: [facility];  Service: Urology;  Laterality: N/A;  2 HRS   . Transurethral resection of bladder tumor  12/25/2011    Procedure: TRANSURETHRAL RESECTION OF BLADDER TUMOR (TURBT);  Surgeon: Franchot Gallo, MD;  Location: [facility];  Service: Urology;  Laterality: N/A;  . Prostate surgery      prostatectomy   Family History  Problem Relation Age of Onset  . Cancer Mother     Non-Hodgkin's Lymphoma  . Diabetes Mother   . Hypertension Mother   . Cancer Father     Colon cancer w/ metastasis to lung & bone  . Cancer Brother 24    Lung cancer  . Diabetes Maternal Grandmother   . Heart failure Maternal Grandmother   . Cancer Brother 65    Non-small cell Lung cancer  . Pneumonia Maternal Grandfather     History reviewed: allergies, current medications, past family history, past medical history, past social history, past surgical history and problem list   Risk Factors: Tobacco History  Substance Use Topics  . Smoking status: Former Smoker -- 1.00 packs/day  for 35 years    Types: Cigarettes    Quit date: 02/07/2004  . Smokeless tobacco: Never Used  . Alcohol Use: Yes     Comment: socially   He does not smoke.  Patient is a former smoker. Are there smokers in your home (other than you)?  No  Alcohol Current alcohol use: social drinker  Caffeine Current caffeine use: coffee 2 /day  Exercise Current exercise: walking  Nutrition/Diet Current diet: in general, a "healthy" diet    Cardiac  risk factors: advanced age (older than 33 for men, 25 for women), dyslipidemia, hypertension, male gender, obesity (BMI >= 30 kg/m2) and sedentary lifestyle.  Depression Screen (Note: if answer to either of the following is "Yes", a more complete depression screening is indicated)   Q1: Over the past two weeks, have you felt down, depressed or hopeless? No  Q2: Over the past two weeks, have you felt little interest or pleasure in doing things? No  Have you lost interest or pleasure in daily life? No  Do you often feel hopeless? No  Do you cry easily over simple problems? No  Activities of Daily Living In your present state of health, do you have any difficulty performing the following activities?:  Driving? No Managing money?  No Feeding yourself? No Getting from bed to chair? No Climbing a flight of stairs? No Preparing food and eating?: No Bathing or showering? No Getting dressed: No Getting to the toilet? No Using the toilet:No Moving around from place to place: No In the past year have you fallen or had a near fall?:No   Are you sexually active?  Yes  Do you have more than one partner?  No  Vision Difficulties: Yes- wears reading glasses  Hearing Difficulties: Yes Do you often ask people to speak up or repeat themselves? Yes Do you experience ringing or noises in your ears? No Do you have difficulty understanding soft or whispered voices? Yes  Cognition  Do you feel that you have a problem with memory?No  Do you often misplace  items? No  Do you feel safe at home?  Yes  Advanced directives Does patient have a [place]? Yes Does patient have a Living Will? Yes   Objective:   Blood pressure 120/68, pulse 60, temperature 97.7 F (36.5 C), resp. rate 16, height 5\' 5"  (1.651 m), weight 182 lb (82.555 kg). Body mass index is 30.29 kg/(m^2).  General appearance: alert, no distress, WD/WN, male Cognitive Testing  Alert? Yes  Normal Appearance?Yes  Oriented to person? Yes  Place? Yes   Time? Yes  Recall of three objects?  Yes  Can perform simple calculations? Yes  Displays appropriate judgment?Yes  Can read the correct time from a watch face?Yes  HEENT: normocephalic, sclerae anicteric, TMs pearly, nares patent, no discharge or erythema, pharynx normal Oral cavity: MMM, no lesions Neck: supple, no lymphadenopathy, no thyromegaly, no masses Heart: RRR, normal S1, S2, no murmurs Lungs: CTA bilaterally, no wheezes, rhonchi, or rales Abdomen: +bs, soft, non tender, non distended, no masses, no hepatomegaly, no splenomegaly Musculoskeletal: nontender, no swelling, no obvious deformity Extremities: no edema, no cyanosis, no clubbing Pulses: 2+ symmetric, upper and lower extremities, normal cap refill Neurological: alert, oriented x 3, CN2-12 intact, strength normal upper extremities and lower extremities, sensation normal throughout, DTRs 2+ throughout, no cerebellar signs, gait normal Psychiatric: normal affect, behavior normal, pleasant   Medicare Attestation I have personally reviewed: The patient's medical and social history Their use of alcohol, tobacco or illicit drugs Their current medications and supplements The patient's functional ability including ADLs,fall risks, home safety risks, cognitive, and hearing and visual impairment Diet and physical activities Evidence for depression or mood disorders  The patient's weight, height, BMI, and visual acuity have been recorded in the  chart.  I have made referrals, counseling, and provided education to the patient based on review of the above and I have provided the patient with a written personalized care plan for preventive services.  Vicie Mutters, PA-C   01/20/2014

## 2014-01-21 LAB — VITAMIN D 25 HYDROXY (VIT D DEFICIENCY, FRACTURES): VIT D 25 HYDROXY: 66 ng/mL (ref 30–100)

## 2014-03-08 ENCOUNTER — Other Ambulatory Visit: Payer: Self-pay | Admitting: Internal Medicine

## 2014-04-14 ENCOUNTER — Encounter: Payer: Self-pay | Admitting: Gastroenterology

## 2014-04-27 ENCOUNTER — Encounter: Payer: Self-pay | Admitting: Internal Medicine

## 2014-04-27 ENCOUNTER — Ambulatory Visit (INDEPENDENT_AMBULATORY_CARE_PROVIDER_SITE_OTHER): Payer: Medicare Other | Admitting: Internal Medicine

## 2014-04-27 VITALS — BP 104/68 | HR 52 | Temp 97.9°F | Resp 16 | Ht 64.5 in | Wt 179.7 lb

## 2014-04-27 DIAGNOSIS — E782 Mixed hyperlipidemia: Secondary | ICD-10-CM

## 2014-04-27 DIAGNOSIS — R7309 Other abnormal glucose: Secondary | ICD-10-CM | POA: Diagnosis not present

## 2014-04-27 DIAGNOSIS — E559 Vitamin D deficiency, unspecified: Secondary | ICD-10-CM

## 2014-04-27 DIAGNOSIS — Z79899 Other long term (current) drug therapy: Secondary | ICD-10-CM | POA: Diagnosis not present

## 2014-04-27 DIAGNOSIS — R7303 Prediabetes: Secondary | ICD-10-CM

## 2014-04-27 DIAGNOSIS — I10 Essential (primary) hypertension: Secondary | ICD-10-CM

## 2014-04-27 LAB — BASIC METABOLIC PANEL WITH GFR
BUN: 13 mg/dL (ref 6–23)
CO2: 26 mEq/L (ref 19–32)
Calcium: 9.3 mg/dL (ref 8.4–10.5)
Chloride: 108 mEq/L (ref 96–112)
Creat: 1.01 mg/dL (ref 0.50–1.35)
GFR, EST AFRICAN AMERICAN: 83 mL/min
GFR, EST NON AFRICAN AMERICAN: 72 mL/min
Glucose, Bld: 94 mg/dL (ref 70–99)
Potassium: 4.4 mEq/L (ref 3.5–5.3)
SODIUM: 142 meq/L (ref 135–145)

## 2014-04-27 LAB — CBC WITH DIFFERENTIAL/PLATELET
Basophils Absolute: 0.1 10*3/uL (ref 0.0–0.1)
Basophils Relative: 1 % (ref 0–1)
EOS ABS: 0.1 10*3/uL (ref 0.0–0.7)
Eosinophils Relative: 2 % (ref 0–5)
HEMATOCRIT: 42.3 % (ref 39.0–52.0)
Hemoglobin: 14.2 g/dL (ref 13.0–17.0)
Lymphocytes Relative: 41 % (ref 12–46)
Lymphs Abs: 2.6 10*3/uL (ref 0.7–4.0)
MCH: 30.9 pg (ref 26.0–34.0)
MCHC: 33.6 g/dL (ref 30.0–36.0)
MCV: 92.2 fL (ref 78.0–100.0)
MONOS PCT: 7 % (ref 3–12)
MPV: 10.6 fL (ref 8.6–12.4)
Monocytes Absolute: 0.4 10*3/uL (ref 0.1–1.0)
NEUTROS PCT: 49 % (ref 43–77)
Neutro Abs: 3.1 10*3/uL (ref 1.7–7.7)
Platelets: 190 10*3/uL (ref 150–400)
RBC: 4.59 MIL/uL (ref 4.22–5.81)
RDW: 13.1 % (ref 11.5–15.5)
WBC: 6.4 10*3/uL (ref 4.0–10.5)

## 2014-04-27 LAB — HEPATIC FUNCTION PANEL
ALBUMIN: 4.3 g/dL (ref 3.5–5.2)
ALK PHOS: 78 U/L (ref 39–117)
ALT: 15 U/L (ref 0–53)
AST: 19 U/L (ref 0–37)
Bilirubin, Direct: 0.1 mg/dL (ref 0.0–0.3)
Indirect Bilirubin: 0.6 mg/dL (ref 0.2–1.2)
TOTAL PROTEIN: 6.3 g/dL (ref 6.0–8.3)
Total Bilirubin: 0.7 mg/dL (ref 0.2–1.2)

## 2014-04-27 LAB — LIPID PANEL
Cholesterol: 144 mg/dL (ref 0–200)
HDL: 42 mg/dL (ref >=40)
LDL Cholesterol: 84 mg/dL (ref 0–99)
TRIGLYCERIDES: 92 mg/dL (ref <150)
Total CHOL/HDL Ratio: 3.4 Ratio
VLDL: 18 mg/dL (ref 0–40)

## 2014-04-27 LAB — HEMOGLOBIN A1C
Hgb A1c MFr Bld: 5.7 % — ABNORMAL HIGH (ref <5.7)
Mean Plasma Glucose: 117 mg/dL — ABNORMAL HIGH (ref <117)

## 2014-04-27 LAB — MAGNESIUM: Magnesium: 2.2 mg/dL (ref 1.5–2.5)

## 2014-04-27 LAB — TSH: TSH: 1.361 u[IU]/mL (ref 0.350–4.500)

## 2014-04-27 NOTE — Patient Instructions (Signed)

## 2014-04-27 NOTE — Progress Notes (Signed)
Patient ID: Raymond Patrick, male   DOB: Aug 09, 1935, 79 y.o.   MRN: [id number]   This very nice 79 y.o. SWM presents for 3 month follow up with Hypertension, Hyperlipidemia, Pre-Diabetes and Vitamin D Deficiency.  Patient also is in remission from Lung Cancer tx'd by RULobectomy in 2008 and is followed by Dr Julien Nordmann. In addition patient has likewise hx of Bladder & Prostate cancers in remission followed by Dr Diona Fanti.    Patient is treated for HTN since 2008  & BP has been controlled at home. Today's BP: 104/68 mmHg. Patient has had no complaints of any cardiac type chest pain, palpitations, dyspnea/orthopnea/PND, dizziness, claudication, or dependent edema.   Hyperlipidemia is controlled with diet & meds. Patient denies myalgias or other med SE's. Last Lipids were  Chol 138; HDL 40; LDL 74; Trig 120 on 01/20/2014.   Also, the patient has history of PreDiabetes since June 2011 with A1c 6.2% and then 5.3% in May 2006 and he's has had no symptoms of reactive hypoglycemia, diabetic polys, paresthesias or visual blurring.  Last A1c was 5.9% on 01/20/2014.    Further, the patient also has history of Vitamin D Deficiency of 30 in 2008  and supplements vitamin D without any suspected side-effects. Last vitamin D was D 66 on 01/20/2014.  Medication Sig  . albuterol  HFA inhaler Inhale 1-2 puffs into the lungs every 6 (six) hours as needed for wheezing or shortness of breath.  Marland Kitchen aspirin 81 MG tablet Take 81 mg by mouth daily.  . cetirizine  10 MG tablet Take 1 tablet (10 mg total) by mouth daily. One tab daily for allergies  . VITAMIN D 2000 UNITS CAPS Take 4,000 Units by mouth daily.  . MULTIVIT W/ MIN Take 1 tablet by mouth every evening.   Marland Kitchen FISH OIL 1000 MG CAPS Take 1 capsule by mouth every evening.   . pravastatin  40 MG tablet Take 1 tablet (40 mg total) by mouth at bedtime.  Marland Kitchen SILVADENE 1 % crm Apply 1 application topically 2 (two) times daily.  Marland Kitchen terazosin  10 MG capsule TAKE ONE CAPSULE BY  MOUTH ONCE DAILY.  . vitamin C  500 MG tablet Take 500 mg by mouth daily.   Allergies  Allergen Reactions  . Nitrofuran Derivatives Shortness Of Breath  . Macrobid [Nitrofurantoin Macrocrystal]     Headache   PMHx:   Past Medical History  Diagnosis Date  . BPH (benign prostatic hypertrophy)   . Hyperlipidemia   . COPD (chronic obstructive pulmonary disease)   . History of kidney stones   . Bladder tumor   . Impaired hearing bilateral aids  . History of lung cancer 2008  S/P RIGHT UPPER LOPECTOMY     STAGE I  NON-SMALL CELL LUNG CANCER--  ONCOLOGIST- DR MOHAMED-   NO RECURRENCE  . Hypertension   . Unspecified vitamin D deficiency   . Cancer     bladder, prostate, lung   Immunization History  Administered Date(s) Administered  . Influenza-Unspecified 11/06/2012  . Pneumococcal Conjugate-13 01/20/2014  . Pneumococcal-Unspecified 02/06/2002, 09/23/2012  . Tdap 03/09/2010   Past Surgical History  Procedure Laterality Date  . Lung removal, partial  2008  . Lithotripsy  2012  . Fiberoptic bronchoscopy  10-28-2008  . Right upper lung lobectomy  09-21-2006  DR BURNEY    NON-SMALL CELL CANCER/ SEVERE COPD  . Transurethral resection of prostate  12/25/2011    Procedure: TRANSURETHRAL RESECTION OF THE PROSTATE WITH GYRUS INSTRUMENTS;  Surgeon:  Franchot Gallo, MD;  Location: [facility];  Service: Urology;  Laterality: N/A;  2 HRS   . Transurethral resection of bladder tumor  12/25/2011    Procedure: TRANSURETHRAL RESECTION OF BLADDER TUMOR (TURBT);  Surgeon: Franchot Gallo, MD;  Location: [facility];  Service: Urology;  Laterality: N/A;  . Prostate surgery      prostatectomy   FHx:    Reviewed / unchanged  SHx:    Reviewed / unchanged  Systems Review:  Constitutional: Denies fever, chills, wt changes, headaches, insomnia, fatigue, night sweats, change in appetite. Eyes: Denies redness, blurred vision, diplopia, discharge, itchy, watery  eyes.  ENT: Denies discharge, congestion, post nasal drip, epistaxis, sore throat, earache, hearing loss, dental pain, tinnitus, vertigo, sinus pain, snoring.  CV: Denies chest pain, palpitations, irregular heartbeat, syncope, dyspnea, diaphoresis, orthopnea, PND, claudication or edema. Respiratory: denies cough, dyspnea, DOE, pleurisy, hoarseness, laryngitis, wheezing.  Gastrointestinal: Denies dysphagia, odynophagia, heartburn, reflux, water brash, abdominal pain or cramps, nausea, vomiting, bloating, diarrhea, constipation, hematemesis, melena, hematochezia  or hemorrhoids. Genitourinary: Denies dysuria, frequency, urgency, nocturia, hesitancy, discharge, hematuria or flank pain. Musculoskeletal: Denies arthralgias, myalgias, stiffness, jt. swelling, pain, limping or strain/sprain.  Skin: Denies pruritus, rash, hives, warts, acne, eczema or change in skin lesion(s). Neuro: No weakness, tremor, incoordination, spasms, paresthesia or pain. Psychiatric: Denies confusion, memory loss or sensory loss. Endo: Denies change in weight, skin or hair change.  Heme/Lymph: No excessive bleeding, bruising or enlarged lymph nodes.  Physical Exam  BP 104/68  Pulse 52  Temp 97.9 F   Resp 16  Ht 5' 4.5"   Wt 179 lb 11.2 oz     BMI 30.38   Appears well nourished and in no distress. Eyes: PERRLA, EOMs, conjunctiva no swelling or erythema. Sinuses: No frontal/maxillary tenderness ENT/Mouth: EAC's clear, TM's nl w/o erythema, bulging. Nares clear w/o erythema, swelling, exudates. Oropharynx clear without erythema or exudates. Oral hygiene is good. Tongue normal, non obstructing. Hearing intact.  Neck: Supple. Thyroid nl. Car 2+/2+ without bruits, nodes or JVD. Chest: Respirations nl with BS clear & equal w/o rales, rhonchi, wheezing or stridor.  Cor: Heart sounds normal w/ regular rate and rhythm without sig. murmurs, gallops, clicks, or rubs. Peripheral pulses normal and equal  without edema.  Abdomen:  Soft & bowel sounds normal. Non-tender w/o guarding, rebound, hernias, masses, or organomegaly.  Lymphatics: Unremarkable.  Musculoskeletal: Full ROM all peripheral extremities, joint stability, 5/5 strength, and normal gait.  Skin: Warm, dry without exposed rashes, lesions or ecchymosis apparent.  Neuro: Cranial nerves intact, reflexes equal bilaterally. Sensory-motor testing grossly intact. Tendon reflexes grossly intact.  Pysch: Alert & oriented x 3.  Insight and judgement nl & appropriate. No ideations.  Assessment and Plan:  1. Essential hypertension  - TSH  2. Hyperlipidemia  - Lipid panel  3. Prediabetes  - Hemoglobin A1c - Insulin, random  4. Vitamin D deficiency  - Vit D  25 hydroxy (rtn osteoporosis monitoring)  5. Medication management - CBC with Differential/Platelet - BASIC METABOLIC PANEL WITH GFR - Hepatic function panel - Magnesium  6. Vitamin D Deficiency  - Continue supplementation.   Recommended regular exercise, BP monitoring, weight control, and discussed med and SE's. Recommended labs to assess and monitor clinical status. Further disposition pending results of labs.

## 2014-04-28 LAB — INSULIN, RANDOM: INSULIN: 7.2 u[IU]/mL (ref 2.0–19.6)

## 2014-04-28 LAB — VITAMIN D 25 HYDROXY (VIT D DEFICIENCY, FRACTURES): Vit D, 25-Hydroxy: 53 ng/mL (ref 30–100)

## 2014-05-25 DIAGNOSIS — C670 Malignant neoplasm of trigone of bladder: Secondary | ICD-10-CM | POA: Diagnosis not present

## 2014-06-09 ENCOUNTER — Other Ambulatory Visit: Payer: Self-pay | Admitting: Internal Medicine

## 2014-08-03 ENCOUNTER — Encounter: Payer: Self-pay | Admitting: Internal Medicine

## 2014-08-03 ENCOUNTER — Ambulatory Visit (INDEPENDENT_AMBULATORY_CARE_PROVIDER_SITE_OTHER): Payer: Medicare Other | Admitting: Internal Medicine

## 2014-08-03 VITALS — BP 118/80 | HR 68 | Temp 98.0°F | Resp 16 | Ht 65.0 in | Wt 178.0 lb

## 2014-08-03 DIAGNOSIS — E663 Overweight: Secondary | ICD-10-CM

## 2014-08-03 DIAGNOSIS — E559 Vitamin D deficiency, unspecified: Secondary | ICD-10-CM | POA: Diagnosis not present

## 2014-08-03 DIAGNOSIS — R7309 Other abnormal glucose: Secondary | ICD-10-CM

## 2014-08-03 DIAGNOSIS — I10 Essential (primary) hypertension: Secondary | ICD-10-CM

## 2014-08-03 DIAGNOSIS — E782 Mixed hyperlipidemia: Secondary | ICD-10-CM

## 2014-08-03 DIAGNOSIS — Z79899 Other long term (current) drug therapy: Secondary | ICD-10-CM | POA: Diagnosis not present

## 2014-08-03 DIAGNOSIS — Z23 Encounter for immunization: Secondary | ICD-10-CM | POA: Diagnosis not present

## 2014-08-03 DIAGNOSIS — E669 Obesity, unspecified: Secondary | ICD-10-CM

## 2014-08-03 DIAGNOSIS — R7303 Prediabetes: Secondary | ICD-10-CM

## 2014-08-03 LAB — HEPATIC FUNCTION PANEL
ALK PHOS: 72 U/L (ref 39–117)
ALT: 18 U/L (ref 0–53)
AST: 21 U/L (ref 0–37)
Albumin: 4.2 g/dL (ref 3.5–5.2)
BILIRUBIN DIRECT: 0.1 mg/dL (ref 0.0–0.3)
BILIRUBIN TOTAL: 0.8 mg/dL (ref 0.2–1.2)
Indirect Bilirubin: 0.7 mg/dL (ref 0.2–1.2)
Total Protein: 6.6 g/dL (ref 6.0–8.3)

## 2014-08-03 LAB — CBC WITH DIFFERENTIAL/PLATELET
BASOS PCT: 1 % (ref 0–1)
Basophils Absolute: 0.1 10*3/uL (ref 0.0–0.1)
EOS ABS: 0.1 10*3/uL (ref 0.0–0.7)
Eosinophils Relative: 2 % (ref 0–5)
HCT: 42.4 % (ref 39.0–52.0)
Hemoglobin: 14.2 g/dL (ref 13.0–17.0)
LYMPHS ABS: 2.6 10*3/uL (ref 0.7–4.0)
Lymphocytes Relative: 40 % (ref 12–46)
MCH: 30.9 pg (ref 26.0–34.0)
MCHC: 33.5 g/dL (ref 30.0–36.0)
MCV: 92.2 fL (ref 78.0–100.0)
MPV: 10.5 fL (ref 8.6–12.4)
Monocytes Absolute: 0.5 10*3/uL (ref 0.1–1.0)
Monocytes Relative: 7 % (ref 3–12)
NEUTROS PCT: 50 % (ref 43–77)
Neutro Abs: 3.3 10*3/uL (ref 1.7–7.7)
Platelets: 187 10*3/uL (ref 150–400)
RBC: 4.6 MIL/uL (ref 4.22–5.81)
RDW: 13.7 % (ref 11.5–15.5)
WBC: 6.5 10*3/uL (ref 4.0–10.5)

## 2014-08-03 LAB — BASIC METABOLIC PANEL WITH GFR
BUN: 11 mg/dL (ref 6–23)
CO2: 27 mEq/L (ref 19–32)
Calcium: 9.6 mg/dL (ref 8.4–10.5)
Chloride: 106 mEq/L (ref 96–112)
Creat: 0.92 mg/dL (ref 0.50–1.35)
GFR, Est Non African American: 81 mL/min
Glucose, Bld: 93 mg/dL (ref 70–99)
Potassium: 4.2 mEq/L (ref 3.5–5.3)
SODIUM: 142 meq/L (ref 135–145)

## 2014-08-03 LAB — LIPID PANEL
Cholesterol: 171 mg/dL (ref 0–200)
HDL: 42 mg/dL (ref >=40)
LDL CALC: 97 mg/dL (ref 0–99)
TRIGLYCERIDES: 161 mg/dL — AB (ref <150)
Total CHOL/HDL Ratio: 4.1 Ratio
VLDL: 32 mg/dL (ref 0–40)

## 2014-08-03 LAB — HEMOGLOBIN A1C
Hgb A1c MFr Bld: 5.7 % — ABNORMAL HIGH (ref <5.7)
MEAN PLASMA GLUCOSE: 117 mg/dL — AB (ref <117)

## 2014-08-03 LAB — TSH: TSH: 0.979 u[IU]/mL (ref 0.350–4.500)

## 2014-08-03 LAB — MAGNESIUM: MAGNESIUM: 2.1 mg/dL (ref 1.5–2.5)

## 2014-08-03 NOTE — Progress Notes (Signed)
Patient ID: Raymond Patrick, male   DOB: 1935/10/03, 79 y.o.   MRN: [id number]  Assessment and Plan:  Hypertension:  -Continue medication,  -monitor blood pressure at home.  -Continue DASH diet.   -Reminder to go to the ER if any CP, SOB, nausea, dizziness, severe HA, changes vision/speech, left arm numbness and tingling, and jaw pain.  Cholesterol: -Continue diet and exercise.  -Check cholesterol.   Pre-diabetes: -Continue diet and exercise.  -Check A1C  Vitamin D Def: -check level -continue medications.   Continue diet and meds as discussed. Further disposition pending results of labs.  HPI 79 y.o. male  presents for 3 month follow up with hypertension, hyperlipidemia, prediabetes and vitamin D.   His blood pressure has been controlled at home, today their BP is BP: 118/80 mmHg.   He does workout.  He does workout several times per week and is doing a lot of weight lifting and is also doing a lot of crunches.   He denies chest pain, shortness of breath, dizziness.   He is on cholesterol medication and denies myalgias. His cholesterol is at goal. The cholesterol last visit was:   Lab Results  Component Value Date   CHOL 144 04/27/2014   HDL 42 04/27/2014   LDLCALC 84 04/27/2014   TRIG 92 04/27/2014   CHOLHDL 3.4 04/27/2014     He has been working on diet and exercise for prediabetes, and denies foot ulcerations, hyperglycemia, hypoglycemia , increased appetite, nausea, paresthesia of the feet, polydipsia, polyuria, visual disturbances, vomiting and weight loss. Last A1C in the office was:  Lab Results  Component Value Date   HGBA1C 5.7* 04/27/2014    Patient is on Vitamin D supplement.  Lab Results  Component Value Date   VD25OH 53 04/27/2014      Current Medications:  Current Outpatient Prescriptions on File Prior to Visit  Medication Sig Dispense Refill  . aspirin 81 MG tablet Take 81 mg by mouth daily.    . cetirizine (ZYRTEC) 10 MG tablet Take 1 tablet (10  mg total) by mouth daily. One tab daily for allergies 30 tablet 1  . Cholecalciferol (VITAMIN D) 2000 UNITS CAPS Take 8,000 Units by mouth daily.     . Multiple Vitamin (MULTIVITAMIN WITH MINERALS) TABS Take 1 tablet by mouth every evening.     . Omega-3 Fatty Acids (FISH OIL) 1000 MG CAPS Take 1 capsule by mouth every evening.     Marland Kitchen OVER THE COUNTER MEDICATION VitaPulse 1 tab every other day.    . pravastatin (PRAVACHOL) 40 MG tablet Take 1 tablet (40 mg total) by mouth at bedtime. 90 tablet 3  . terazosin (HYTRIN) 10 MG capsule TAKE ONE CAPSULE BY MOUTH ONCE DAILY 90 capsule 0  . vitamin C (ASCORBIC ACID) 500 MG tablet Take 500 mg by mouth daily.    Marland Kitchen albuterol (PROVENTIL HFA;VENTOLIN HFA) 108 (90 BASE) MCG/ACT inhaler Inhale 1-2 puffs into the lungs every 6 (six) hours as needed for wheezing or shortness of breath. (Patient not taking: Reported on 08/03/2014) 18 g 2   No current facility-administered medications on file prior to visit.    Medical History:  Past Medical History  Diagnosis Date  . BPH (benign prostatic hypertrophy)   . Hyperlipidemia   . COPD (chronic obstructive pulmonary disease)   . History of kidney stones   . Bladder tumor   . Impaired hearing bilateral aids  . History of lung cancer 2008  S/P RIGHT UPPER LOPECTOMY  STAGE I  NON-SMALL CELL LUNG CANCER--  ONCOLOGIST- DR MOHAMED-   NO RECURRENCE  . Hypertension   . Unspecified vitamin D deficiency   . Cancer     bladder, prostate, lung    Allergies:  Allergies  Allergen Reactions  . Nitrofuran Derivatives Shortness Of Breath  . Macrobid [Nitrofurantoin Macrocrystal]     Headache     Review of Systems:  Review of Systems  Constitutional: Negative for fever, chills and malaise/fatigue.  HENT: Negative for congestion, ear pain and sore throat.   Eyes: Positive for discharge (tearing and burning, and itching). Negative for blurred vision.  Respiratory: Negative for cough, shortness of breath and wheezing.    Cardiovascular: Negative for chest pain, palpitations and leg swelling.  Gastrointestinal: Positive for constipation. Negative for heartburn, nausea, vomiting, diarrhea, blood in stool and melena.  Genitourinary: Negative.   Skin: Negative.   Neurological: Negative for dizziness, sensory change and headaches.  Psychiatric/Behavioral: Negative for depression. The patient is not nervous/anxious and does not have insomnia.     Family history- Review and unchanged  Social history- Review and unchanged  Physical Exam: BP 118/80 mmHg  Pulse 68  Temp(Src) 98 F (36.7 C) (Temporal)  Resp 16  Ht '5\' 5"'$  (1.651 m)  Wt 178 lb (80.74 kg)  BMI 29.62 kg/m2 Wt Readings from Last 3 Encounters:  08/03/14 178 lb (80.74 kg)  04/27/14 179 lb 11.2 oz (81.511 kg)  01/20/14 182 lb (82.555 kg)    General Appearance: Well nourished well developed, in no apparent distress. Eyes: PERRLA, EOMs, conjunctiva no swelling or erythema ENT/Mouth: Ear canals normal without obstruction, swelling, erythma, discharge.  TMs normal bilaterally.  Oropharynx moist, clear, without exudate, or postoropharyngeal swelling. Neck: Supple, thyroid normal,no cervical adenopathy  Respiratory: Respiratory effort normal, Breath sounds clear A&P without rhonchi, wheeze, or rale.  No retractions, no accessory usage. Cardio: RRR with no MRGs. Brisk peripheral pulses without edema.  Abdomen: Soft, + BS,  Non tender, no guarding, rebound, hernias, masses. Musculoskeletal: Full ROM, 5/5 strength, Normal gait Skin: Warm, dry without rashes, lesions, ecchymosis.  Neuro: Awake and oriented X 3, Cranial nerves intact. Normal muscle tone, no cerebellar symptoms. Psych: Normal affect, Insight and Judgment appropriate.    FORCUCCI, Azoria Abbett, PA-C 10:48 AM [facility]

## 2014-08-03 NOTE — Patient Instructions (Signed)
Herpes Zoster Virus Vaccine What is this medicine? HERPES ZOSTER VIRUS VACCINE (HUR peez ZOS ter vahy ruhs vak SEEN) is a vaccine. It is used to prevent shingles in adults 77 years old and over. This vaccine is not used to treat shingles or nerve pain from shingles. This medicine may be used for other purposes; ask your health care provider or pharmacist if you have questions. COMMON BRAND NAME(S): Varivax, Zostavax What should I tell my health care provider before I take this medicine? They need to know if you have any of these conditions: -cancer like leukemia or lymphoma -immune system problems or therapy -infection with fever -tuberculosis -an unusual or allergic reaction to vaccines, neomycin, gelatin, other medicines, foods, dyes, or preservatives -pregnant or trying to get pregnant -breast-feeding How should I use this medicine? This vaccine is for injection under the skin. It is given by a health care professional. Talk to your pediatrician regarding the use of this medicine in children. This medicine is not approved for use in children. Overdosage: If you think you have taken too much of this medicine contact a poison control center or emergency room at once. NOTE: This medicine is only for you. Do not share this medicine with others. What if I miss a dose? This does not apply. What may interact with this medicine? Do not take this medicine with any of the following medications: -adalimumab -anakinra -etanercept -infliximab -medicines to treat cancer -medicines that suppress your immune system This medicine may also interact with the following medications: -immunoglobulins -steroid medicines like prednisone or cortisone This list may not describe all possible interactions. Give your health care provider a list of all the medicines, herbs, non-prescription drugs, or dietary supplements you use. Also tell them if you smoke, drink alcohol, or use illegal drugs. Some items may  interact with your medicine. What should I watch for while using this medicine? Visit your doctor for regular check ups. This vaccine, like all vaccines, may not fully protect everyone. After receiving this vaccine it may be possible to pass chickenpox infection to others. Avoid people with immune system problems, pregnant women who have not had chickenpox, and newborns of women who have not had chickenpox. Talk to your doctor for more information. What side effects may I notice from receiving this medicine? Side effects that you should report to your doctor or health care professional as soon as possible: -allergic reactions like skin rash, itching or hives, swelling of the face, lips, or tongue -breathing problems -feeling faint or lightheaded, falls -fever, flu-like symptoms -pain, tingling, numbness in the hands or feet -swelling of the ankles, feet, hands -unusually weak or tired Side effects that usually do not require medical attention (report to your doctor or health care professional if they continue or are bothersome): -aches or pains -chickenpox-like rash -diarrhea -headache -loss of appetite -nausea, vomiting -redness, pain, swelling at site where injected -runny nose This list may not describe all possible side effects. Call your doctor for medical advice about side effects. You may report side effects to FDA at 1-800-FDA-1088. Where should I keep my medicine? This drug is given in a hospital or clinic and will not be stored at home. NOTE: This sheet is a summary. It may not cover all possible information. If you have questions about this medicine, talk to your doctor, pharmacist, or health care provider.  2015, Elsevier/Gold Standard. (2009-07-12 17:43:50)  

## 2014-08-04 LAB — INSULIN, RANDOM: Insulin: 5.3 u[IU]/mL (ref 2.0–19.6)

## 2014-08-04 LAB — VITAMIN D 25 HYDROXY (VIT D DEFICIENCY, FRACTURES): Vit D, 25-Hydroxy: 64 ng/mL (ref 30–100)

## 2014-09-11 ENCOUNTER — Other Ambulatory Visit: Payer: Self-pay | Admitting: Internal Medicine

## 2014-10-07 ENCOUNTER — Encounter: Payer: Self-pay | Admitting: Internal Medicine

## 2014-10-20 ENCOUNTER — Telehealth: Payer: Self-pay | Admitting: Internal Medicine

## 2014-10-20 ENCOUNTER — Other Ambulatory Visit (HOSPITAL_BASED_OUTPATIENT_CLINIC_OR_DEPARTMENT_OTHER): Payer: Medicare Other

## 2014-10-20 DIAGNOSIS — C3490 Malignant neoplasm of unspecified part of unspecified bronchus or lung: Secondary | ICD-10-CM

## 2014-10-20 DIAGNOSIS — Z85118 Personal history of other malignant neoplasm of bronchus and lung: Secondary | ICD-10-CM | POA: Diagnosis present

## 2014-10-20 LAB — COMPREHENSIVE METABOLIC PANEL (CC13)
ALBUMIN: 3.8 g/dL (ref 3.5–5.0)
ALT: 19 U/L (ref 0–55)
ANION GAP: 6 meq/L (ref 3–11)
AST: 25 U/L (ref 5–34)
Alkaline Phosphatase: 78 U/L (ref 40–150)
BUN: 11.9 mg/dL (ref 7.0–26.0)
CHLORIDE: 111 meq/L — AB (ref 98–109)
CO2: 26 mEq/L (ref 22–29)
Calcium: 9.3 mg/dL (ref 8.4–10.4)
Creatinine: 1 mg/dL (ref 0.7–1.3)
EGFR: 77 mL/min/{1.73_m2} — AB (ref >90)
Glucose: 100 mg/dl (ref 70–140)
Potassium: 4.2 mEq/L (ref 3.5–5.1)
Sodium: 143 mEq/L (ref 136–145)
Total Bilirubin: 0.61 mg/dL (ref 0.20–1.20)
Total Protein: 6.4 g/dL (ref 6.4–8.3)

## 2014-10-20 LAB — CBC WITH DIFFERENTIAL/PLATELET
BASO%: 0.9 % (ref 0.0–2.0)
BASOS ABS: 0.1 10*3/uL (ref 0.0–0.1)
EOS ABS: 0.1 10*3/uL (ref 0.0–0.5)
EOS%: 1.4 % (ref 0.0–7.0)
HEMATOCRIT: 41 % (ref 38.4–49.9)
HEMOGLOBIN: 14 g/dL (ref 13.0–17.1)
LYMPH#: 2.8 10*3/uL (ref 0.9–3.3)
LYMPH%: 30.6 % (ref 14.0–49.0)
MCH: 31.5 pg (ref 27.2–33.4)
MCHC: 34.2 g/dL (ref 32.0–36.0)
MCV: 92.1 fL (ref 79.3–98.0)
MONO#: 0.5 10*3/uL (ref 0.1–0.9)
MONO%: 5.6 % (ref 0.0–14.0)
NEUT#: 5.6 10*3/uL (ref 1.5–6.5)
NEUT%: 61.5 % (ref 39.0–75.0)
PLATELETS: 179 10*3/uL (ref 140–400)
RBC: 4.45 10*6/uL (ref 4.20–5.82)
RDW: 13.4 % (ref 11.0–14.6)
WBC: 9.2 10*3/uL (ref 4.0–10.3)

## 2014-10-20 NOTE — Telephone Encounter (Signed)
Pt came in concerning Chest CT per MD states he thought he was supposed to have this, sent msg to MD and then will contact pt... KJ

## 2014-10-22 NOTE — Telephone Encounter (Signed)
Received msg from [facility] advising pt does need chest x-ray, lft msg for pt confirming this to be done before his 09/19 MD visit, advised this was a walkin and please call our office confirming he received this message.... KJ

## 2014-10-23 ENCOUNTER — Ambulatory Visit (HOSPITAL_COMMUNITY)
Admission: RE | Admit: 2014-10-23 | Discharge: 2014-10-23 | Disposition: A | Discharge status: Home / Self Care | Payer: Medicare Other | Source: Ambulatory Visit | Attending: Internal Medicine | Admitting: Internal Medicine

## 2014-10-23 ENCOUNTER — Other Ambulatory Visit: Payer: Self-pay | Admitting: Medical Oncology

## 2014-10-23 DIAGNOSIS — C3490 Malignant neoplasm of unspecified part of unspecified bronchus or lung: Secondary | ICD-10-CM | POA: Diagnosis not present

## 2014-10-23 DIAGNOSIS — I10 Essential (primary) hypertension: Secondary | ICD-10-CM | POA: Diagnosis not present

## 2014-10-23 DIAGNOSIS — Z87891 Personal history of nicotine dependence: Secondary | ICD-10-CM | POA: Insufficient documentation

## 2014-10-23 DIAGNOSIS — Z85118 Personal history of other malignant neoplasm of bronchus and lung: Secondary | ICD-10-CM | POA: Diagnosis not present

## 2014-10-26 ENCOUNTER — Encounter: Payer: Self-pay | Admitting: Internal Medicine

## 2014-10-26 ENCOUNTER — Telehealth: Payer: Self-pay | Admitting: Internal Medicine

## 2014-10-26 ENCOUNTER — Ambulatory Visit (HOSPITAL_BASED_OUTPATIENT_CLINIC_OR_DEPARTMENT_OTHER): Payer: Medicare Other | Admitting: Internal Medicine

## 2014-10-26 VITALS — BP 107/67 | HR 71 | Temp 98.1°F | Resp 18 | Ht 65.0 in | Wt 175.1 lb

## 2014-10-26 DIAGNOSIS — Z85118 Personal history of other malignant neoplasm of bronchus and lung: Secondary | ICD-10-CM | POA: Diagnosis not present

## 2014-10-26 DIAGNOSIS — Z902 Acquired absence of lung [part of]: Secondary | ICD-10-CM

## 2014-10-26 NOTE — Progress Notes (Signed)
[facility] Telephone:([phone]) [phone]   Fax:(336) [place], [place] [phone]  DIAGNOSIS: Stage IA (T1a., N0, MX) non-small cell lung cancer, adenocarcinoma diagnosed in July 2008.   PRIOR THERAPY: Status post wedge resection of the right upper lobe with lymph node biopsy under the care of Dr. Arlyce Dice on 09/21/2006   CURRENT THERAPY: Observation.  INTERVAL HISTORY: Raymond Patrick 79 y.o. male returns to the clinic today for annual followup visit. The patient has been observation for the last 18 years. He is feeling fine today with no specific complaints. He denied having any significant chest pain, shortness of breath, cough or hemoptysis. He denied having any weight loss or night sweats. He has repeat CBC and comprehensive metabolic panel as well as chest x-ray performed recently and he is here for evaluation and discussion of his lab and imaging results.  MEDICAL HISTORY: Past Medical History  Diagnosis Date  . BPH (benign prostatic hypertrophy)   . Hyperlipidemia   . COPD (chronic obstructive pulmonary disease)   . History of kidney stones   . Bladder tumor   . Impaired hearing bilateral aids  . History of lung cancer 2008  S/P RIGHT UPPER LOPECTOMY     STAGE I  NON-SMALL CELL LUNG CANCER--  ONCOLOGIST- DR Travas Schexnayder-   NO RECURRENCE  . Hypertension   . Unspecified vitamin D deficiency   . Cancer     bladder, prostate, lung    ALLERGIES:  is allergic to nitrofuran derivatives and macrobid.  MEDICATIONS:  Current Outpatient Prescriptions  Medication Sig Dispense Refill  . aspirin 81 MG tablet Take 81 mg by mouth daily.    . cetirizine (ZYRTEC) 10 MG tablet Take 1 tablet (10 mg total) by mouth daily. One tab daily for allergies 30 tablet 1  . Cholecalciferol (VITAMIN D) 2000 UNITS CAPS Take 8,000 Units by mouth daily.     . Multiple Vitamin (MULTIVITAMIN WITH MINERALS) TABS  Take 1 tablet by mouth every evening.     . Omega-3 Fatty Acids (FISH OIL) 1000 MG CAPS Take 1 capsule by mouth every evening.     . terazosin (HYTRIN) 10 MG capsule TAKE ONE CAPSULE BY MOUTH ONCE DAILY 90 capsule 1  . vitamin C (ASCORBIC ACID) 500 MG tablet Take 500 mg by mouth daily.    Marland Kitchen albuterol (PROVENTIL HFA;VENTOLIN HFA) 108 (90 BASE) MCG/ACT inhaler Inhale 1-2 puffs into the lungs every 6 (six) hours as needed for wheezing or shortness of breath. (Patient not taking: Reported on 08/03/2014) 18 g 2  . OVER THE COUNTER MEDICATION VitaPulse 1 tab every other day.    . pravastatin (PRAVACHOL) 40 MG tablet Take 1 tablet (40 mg total) by mouth at bedtime. 90 tablet 3   No current facility-administered medications for this visit.    SURGICAL HISTORY:  Past Surgical History  Procedure Laterality Date  . Lung removal, partial  2008  . Lithotripsy  2012  . Fiberoptic bronchoscopy  10-28-2008  . Right upper lung lobectomy  09-21-2006  DR BURNEY    NON-SMALL CELL CANCER/ SEVERE COPD  . Transurethral resection of prostate  12/25/2011    Procedure: TRANSURETHRAL RESECTION OF THE PROSTATE WITH GYRUS INSTRUMENTS;  Surgeon: Franchot Gallo, MD;  Location: [facility];  Service: Urology;  Laterality: N/A;  2 HRS   . Transurethral resection of bladder tumor  12/25/2011    Procedure: TRANSURETHRAL RESECTION  OF BLADDER TUMOR (TURBT);  Surgeon: Franchot Gallo, MD;  Location: [facility];  Service: Urology;  Laterality: N/A;  . Prostate surgery      prostatectomy    REVIEW OF SYSTEMS:  A comprehensive review of systems was negative.   PHYSICAL EXAMINATION: General appearance: alert, cooperative and no distress Head: Normocephalic, without obvious abnormality, atraumatic Neck: no adenopathy Lymph nodes: Cervical, supraclavicular, and axillary nodes normal. Resp: clear to auscultation bilaterally Cardio: regular rate and rhythm, S1, S2 normal, no murmur, click,  rub or gallop GI: soft, non-tender; bowel sounds normal; no masses,  no organomegaly Extremities: extremities normal, atraumatic, no cyanosis or edema  ECOG PERFORMANCE STATUS: 1 - Symptomatic but completely ambulatory  Blood pressure 107/67, pulse 71, temperature 98.1 F (36.7 C), temperature source Oral, resp. rate 18, height '5\' 5"'$  (1.651 m), weight 175 lb 1.6 oz (79.425 kg), SpO2 98 %.  LABORATORY DATA: Lab Results  Component Value Date   WBC 9.2 10/20/2014   HGB 14.0 10/20/2014   HCT 41.0 10/20/2014   MCV 92.1 10/20/2014   PLT 179 10/20/2014      Chemistry      Component Value Date/Time   NA 143 10/20/2014 0848   NA 142 08/03/2014 1116   NA 141 10/05/2010 0918   K 4.2 10/20/2014 0848   K 4.2 08/03/2014 1116   K 4.3 10/05/2010 0918   CL 106 08/03/2014 1116   CL 108* 10/16/2011 0828   CL 102 10/05/2010 0918   CO2 26 10/20/2014 0848   CO2 27 08/03/2014 1116   CO2 29 10/05/2010 0918   BUN 11.9 10/20/2014 0848   BUN 11 08/03/2014 1116   BUN 11 10/05/2010 0918   CREATININE 1.0 10/20/2014 0848   CREATININE 0.92 08/03/2014 1116   CREATININE 1.12 08/31/2011 0708      Component Value Date/Time   CALCIUM 9.3 10/20/2014 0848   CALCIUM 9.6 08/03/2014 1116   CALCIUM 9.2 10/05/2010 0918   ALKPHOS 78 10/20/2014 0848   ALKPHOS 72 08/03/2014 1116   ALKPHOS 83 10/05/2010 0918   AST 25 10/20/2014 0848   AST 21 08/03/2014 1116   AST 31 10/05/2010 0918   ALT 19 10/20/2014 0848   ALT 18 08/03/2014 1116   ALT 28 10/05/2010 0918   BILITOT 0.61 10/20/2014 0848   BILITOT 0.8 08/03/2014 1116   BILITOT 0.80 10/05/2010 0918       RADIOGRAPHIC STUDIES: Dg Chest 2 View  10/23/2014   CLINICAL DATA:  f/u lung cancer Hx of lung cancer. Routine follow up. No chest complaints. Pt takes HTN meds. Not diabetic. Quit smoking in 2006. Smoked for 20 years prior to quitting.  EXAM: CHEST  2 VIEW  COMPARISON:  10/22/2013  FINDINGS: Right upper lung pulmonary anastomosis staples are stable from  prior exam.  Lungs are mildly hyperexpanded. There is no lung consolidation or edema. No mass or nodule.  Cardiac silhouette is normal in size. No mediastinal or hilar masses or evidence of adenopathy.  Bony thorax is demineralized. No osteoblastic or osteolytic lesions.  IMPRESSION: 1. No acute cardiopulmonary disease. 2. Stable appearance from the prior study.   Electronically Signed   By: Lajean Manes M.D.   On: 10/23/2014 15:10   ASSESSMENT AND PLAN: this is a very pleasant 79 years old white male with history of stage IA non-small cell lung cancer status post wedge resection of the right upper lobe and has been observation since August of 2008 with no evidence for disease recurrence.  The recent chest x-ray showed no acute cardiopulmonary disease. I discussed the lab and results with the patient today.  I recommended for him to continue on observation with repeat chest x-ray today as well as repeat chest x-ray and blood work in one year. He was advised to call immediately if he has any concerning symptoms in the interval. The patient voices understanding of current disease status and treatment options and is in agreement with the current care plan.  All questions were answered. The patient knows to call the clinic with any problems, questions or concerns. We can certainly see the patient much sooner if necessary.  Disclaimer: This note was dictated with voice recognition software. Similar sounding words can inadvertently be transcribed and may be missed upon review.

## 2014-10-26 NOTE — Telephone Encounter (Signed)
per pof to sch pt appt-gave pt copy of avs °

## 2014-10-29 ENCOUNTER — Encounter: Payer: Self-pay | Admitting: Gastroenterology

## 2014-11-03 ENCOUNTER — Ambulatory Visit (INDEPENDENT_AMBULATORY_CARE_PROVIDER_SITE_OTHER): Payer: Medicare Other | Admitting: Internal Medicine

## 2014-11-03 ENCOUNTER — Encounter: Payer: Self-pay | Admitting: Internal Medicine

## 2014-11-03 VITALS — BP 140/62 | HR 68 | Temp 97.7°F | Resp 16 | Ht 64.5 in | Wt 174.2 lb

## 2014-11-03 DIAGNOSIS — Z1389 Encounter for screening for other disorder: Secondary | ICD-10-CM

## 2014-11-03 DIAGNOSIS — Z86010 Personal history of colonic polyps: Secondary | ICD-10-CM

## 2014-11-03 DIAGNOSIS — R7303 Prediabetes: Secondary | ICD-10-CM

## 2014-11-03 DIAGNOSIS — Z0000 Encounter for general adult medical examination without abnormal findings: Secondary | ICD-10-CM

## 2014-11-03 DIAGNOSIS — E669 Obesity, unspecified: Secondary | ICD-10-CM | POA: Diagnosis not present

## 2014-11-03 DIAGNOSIS — I10 Essential (primary) hypertension: Secondary | ICD-10-CM | POA: Diagnosis not present

## 2014-11-03 DIAGNOSIS — Z1331 Encounter for screening for depression: Secondary | ICD-10-CM

## 2014-11-03 DIAGNOSIS — Z789 Other specified health status: Secondary | ICD-10-CM

## 2014-11-03 DIAGNOSIS — Z1212 Encounter for screening for malignant neoplasm of rectum: Secondary | ICD-10-CM

## 2014-11-03 DIAGNOSIS — Z85118 Personal history of other malignant neoplasm of bronchus and lung: Secondary | ICD-10-CM | POA: Diagnosis not present

## 2014-11-03 DIAGNOSIS — Z6829 Body mass index (BMI) 29.0-29.9, adult: Secondary | ICD-10-CM | POA: Diagnosis not present

## 2014-11-03 DIAGNOSIS — R6889 Other general symptoms and signs: Secondary | ICD-10-CM | POA: Diagnosis not present

## 2014-11-03 DIAGNOSIS — Z79899 Other long term (current) drug therapy: Secondary | ICD-10-CM

## 2014-11-03 DIAGNOSIS — E663 Overweight: Secondary | ICD-10-CM | POA: Insufficient documentation

## 2014-11-03 DIAGNOSIS — R7309 Other abnormal glucose: Secondary | ICD-10-CM | POA: Diagnosis not present

## 2014-11-03 DIAGNOSIS — E782 Mixed hyperlipidemia: Secondary | ICD-10-CM | POA: Diagnosis not present

## 2014-11-03 DIAGNOSIS — E559 Vitamin D deficiency, unspecified: Secondary | ICD-10-CM | POA: Diagnosis not present

## 2014-11-03 DIAGNOSIS — N320 Bladder-neck obstruction: Secondary | ICD-10-CM

## 2014-11-03 DIAGNOSIS — Z0001 Encounter for general adult medical examination with abnormal findings: Secondary | ICD-10-CM | POA: Diagnosis not present

## 2014-11-03 DIAGNOSIS — Z23 Encounter for immunization: Secondary | ICD-10-CM

## 2014-11-03 DIAGNOSIS — Z125 Encounter for screening for malignant neoplasm of prostate: Secondary | ICD-10-CM

## 2014-11-03 DIAGNOSIS — Z9181 History of falling: Secondary | ICD-10-CM

## 2014-11-03 LAB — CBC WITH DIFFERENTIAL/PLATELET
BASOS ABS: 0.1 10*3/uL (ref 0.0–0.1)
Basophils Relative: 1 % (ref 0–1)
EOS ABS: 0.1 10*3/uL (ref 0.0–0.7)
EOS PCT: 2 % (ref 0–5)
HEMATOCRIT: 42.3 % (ref 39.0–52.0)
Hemoglobin: 14.5 g/dL (ref 13.0–17.0)
LYMPHS ABS: 2.9 10*3/uL (ref 0.7–4.0)
LYMPHS PCT: 43 % (ref 12–46)
MCH: 31.3 pg (ref 26.0–34.0)
MCHC: 34.3 g/dL (ref 30.0–36.0)
MCV: 91.2 fL (ref 78.0–100.0)
MONO ABS: 0.5 10*3/uL (ref 0.1–1.0)
MONOS PCT: 7 % (ref 3–12)
MPV: 10.2 fL (ref 8.6–12.4)
Neutro Abs: 3.2 10*3/uL (ref 1.7–7.7)
Neutrophils Relative %: 47 % (ref 43–77)
PLATELETS: 197 10*3/uL (ref 150–400)
RBC: 4.64 MIL/uL (ref 4.22–5.81)
RDW: 13.6 % (ref 11.5–15.5)
WBC: 6.8 10*3/uL (ref 4.0–10.5)

## 2014-11-03 LAB — HEPATIC FUNCTION PANEL
ALBUMIN: 4 g/dL (ref 3.6–5.1)
ALT: 19 U/L (ref 9–46)
AST: 22 U/L (ref 10–35)
Alkaline Phosphatase: 78 U/L (ref 40–115)
BILIRUBIN TOTAL: 0.6 mg/dL (ref 0.2–1.2)
Bilirubin, Direct: 0.1 mg/dL (ref <=0.2)
Indirect Bilirubin: 0.5 mg/dL (ref 0.2–1.2)
TOTAL PROTEIN: 6.4 g/dL (ref 6.1–8.1)

## 2014-11-03 LAB — LIPID PANEL
Cholesterol: 154 mg/dL (ref 125–200)
HDL: 41 mg/dL (ref >=40)
LDL CALC: 92 mg/dL (ref <130)
TRIGLYCERIDES: 103 mg/dL (ref <150)
Total CHOL/HDL Ratio: 3.8 Ratio (ref <=5.0)
VLDL: 21 mg/dL (ref <30)

## 2014-11-03 LAB — BASIC METABOLIC PANEL WITH GFR
BUN: 15 mg/dL (ref 7–25)
CALCIUM: 9.4 mg/dL (ref 8.6–10.3)
CO2: 29 mmol/L (ref 20–31)
CREATININE: 0.99 mg/dL (ref 0.70–1.18)
Chloride: 105 mmol/L (ref 98–110)
GFR, EST AFRICAN AMERICAN: 85 mL/min (ref >=60)
GFR, Est Non African American: 74 mL/min (ref >=60)
GLUCOSE: 89 mg/dL (ref 65–99)
Potassium: 4.1 mmol/L (ref 3.5–5.3)
Sodium: 141 mmol/L (ref 135–146)

## 2014-11-03 LAB — MAGNESIUM: MAGNESIUM: 2.1 mg/dL (ref 1.5–2.5)

## 2014-11-03 LAB — TSH: TSH: 1.231 u[IU]/mL (ref 0.350–4.500)

## 2014-11-03 NOTE — Patient Instructions (Signed)
Recommend Adult Low dose Aspirin or   coated  Aspirin 81 mg daily   To reduce risk of Colon Cancer 20 %,   Skin Cancer 26 % ,   Melanoma 46%   and   Pancreatic cancer 60%  ++++++++++++++++++  Vitamin D goal   is between 70-100.   Please make sure that you are taking your Vitamin D as directed.   It is very important as a natural anti-inflammatory   helping hair, skin, and nails, as well as reducing stroke and heart attack risk.   It helps your bones and helps with mood.  It also decreases numerous cancer risks so please take it as directed.   Low Vit D is associated with a 200-300% higher risk for CANCER   and 200-300% higher risk for HEART   ATTACK  &  STROKE.   .....................................Marland Kitchen  It is also associated with higher death rate at younger ages,   autoimmune diseases like Rheumatoid arthritis, Lupus, Multiple Sclerosis.     Also many other serious conditions, like depression, Alzheimer's  Dementia, infertility, muscle aches, fatigue, fibromyalgia - just to name a few.  +++++++++++++++++++  Recommend the book "The END of DIETING" by Dr Excell Seltzer   & the book "The END of DIABETES " by Dr Excell Seltzer  At [email or web address] - get book & Audio CD's     Being diabetic has a  300% increased risk for heart attack, stroke, cancer, and alzheimer- type vascular dementia. It is very important that you work harder with diet by avoiding all foods that are white. Avoid white rice (Mouhamad Teed & wild rice is OK), white potatoes (sweetpotatoes in moderation is OK), White bread or wheat bread or anything made out of white flour like bagels, donuts, rolls, buns, biscuits, cakes, pastries, cookies, pizza crust, and pasta (made from white flour & egg whites) - vegetarian pasta or spinach or wheat pasta is OK. Multigrain breads like Arnold's or Pepperidge Farm, or multigrain sandwich thins or flatbreads.  Diet, exercise and weight loss can reverse and cure diabetes in the early  stages.  Diet, exercise and weight loss is very important in the control and prevention of complications of diabetes which affects every system in your body, ie. Brain - dementia/stroke, eyes - glaucoma/blindness, heart - heart attack/heart failure, kidneys - dialysis, stomach - gastric paralysis, intestines - malabsorption, nerves - severe painful neuritis, circulation - gangrene & loss of a leg(s), and finally cancer and Alzheimers.    I recommend avoid fried & greasy foods,  sweets/candy, white rice (Deberah Adolf or wild rice or Quinoa is OK), white potatoes (sweet potatoes are OK) - anything made from white flour - bagels, doughnuts, rolls, buns, biscuits,white and wheat breads, pizza crust and traditional pasta made of white flour & egg white(vegetarian pasta or spinach or wheat pasta is OK).  Multi-grain bread is OK - like multi-grain flat bread or sandwich thins. Avoid alcohol in excess. Exercise is also important.    Eat all the vegetables you want - avoid meat, especially red meat and dairy - especially cheese.  Cheese is the most concentrated form of trans-fats which is the worst thing to clog up our arteries. Veggie cheese is OK which can be found in the fresh produce section at [facility] or [employer] or [employer]  ++++++++++++++++++++++++++  .preventm

## 2014-11-03 NOTE — Progress Notes (Signed)
Patient ID: Raymond Patrick, male   DOB: 02-07-1935, 79 y.o.   MRN: [id number]  MEDICARE ANNUAL WELLNESS VISIT & Comprehensive Evaluation,Examination & management  Assessment:   1. Essential hypertension  - Microalbumin / creatinine urine ratio - EKG 12-Lead - [place], RETROPERITNL ABD,  LTD - TSH  2. Hyperlipidemia  - Lipid panel  3. Prediabetes  - Hemoglobin A1c - Insulin, random  4. Vitamin D deficiency  - Vit D  25 hydroxy   5. Obesity   6. History of lung cancer   7. Screening for rectal cancer  - POC Hemoccult Bld/Stl   - Refer for f/u Colonscopy  8. Prostate cancer screening  - PSA  9. Bladder neck obstruction  - PSA  10. Need for prophylactic vaccination and inoculation against influenza  - Flu vaccine HIGH DOSE PF (Fluzone High dose)  11. Encounter for general adult medical examination with abnormal findings   12. Medication management  - Urinalysis, Routine w reflex microscopic (not at [facility]) - CBC with Differential/Platelet - BASIC METABOLIC PANEL WITH GFR - Hepatic function panel - Magnesium  13. BMI 29.0-29.9,adult  Plan:   During the course of the visit the patient was educated and counseled about appropriate screening and preventive services including:    Pneumococcal vaccine   Influenza vaccine  Td vaccine  Screening electrocardiogram  Bone densitometry screening  Colorectal cancer screening  Diabetes screening  Glaucoma screening  Nutrition counseling   Advanced directives: requested  Screening recommendations, referrals: Vaccinations: Immunization History  Administered Date(s) Administered  . Influenza, High Dose Seasonal PF 11/03/2014  . Influenza-Unspecified 11/06/2012  . Pneumococcal Conjugate-13 01/20/2014  . Pneumococcal-23 02/06/2002, 09/23/2012  . Tdap 03/09/2010  . Zoster 08/03/2014  Hep B vaccine not indicated  Nutrition assessed and recommended  Colonoscopy 05/09/2004 Recommended yearly  ophthalmology/optometry visit for glaucoma screening and checkup Recommended yearly dental visit for hygiene and checkup Advanced directives - No - Given forms today  Conditions/risks identified: BMI: Discussed weight loss, diet, and increase physical activity.  Increase physical activity: AHA recommends 150 minutes of physical activity a week.  Medications reviewed PreDiabetes is at goal, ACE/ARB therapy: Not Indicated Urinary Incontinence is not an issue: discussed non pharmacology and pharmacology options.  Fall risk: low- discussed PT, home fall assessment, medications.   Subjective:      Raymond Patrick presents for [facility] Annual Wellness Visit and presents for a comprehensive evaluation, examination and management of multiple medical co-morbidities.  Date of last medicare wellness visit was 09/30/2013. This very nice 79 y.o. Single WM presents for  follow up with Hypertension, Hyperlipidemia, Pre-Diabetes and Vitamin D Deficiency. Patient had RUL lobectomy by VATS in 2008 for Primary Lung Cancer and has been followed by Dr Julien Nordmann. Also in 2013 he had TURP & TUBR for Bladder and Prostate Cancer and is followed by Dr Nira Conn.      Patient is treated for HTN since 1998 & BP has been controlled at home. Today's BP: 140/62 mmHg. Patient has had no complaints of any cardiac type chest pain, palpitations, dyspnea/orthopnea/PND, dizziness, claudication, or dependent edema.     Hyperlipidemia is controlled with diet & meds. Patient denies myalgias or other med SE's. Last Lipids were at goal -  Cholesterol 171; HDL 42; LDL 97; Triglycerides 161 on 08/03/2014.     Also, the patient has history of PreDiabetes since 2011 with A1c 6.2%, then dropping to 5.3% in 2012 and back up to 5.8% in 2013. He has had no symptoms of reactive  hypoglycemia, diabetic polys, paresthesias or visual blurring.  Last A1c was  5.7% on 08/03/2014.     Further, the patient also has history of Vitamin D Deficiency of 30 in  2008 and supplements vitamin D without any suspected side-effects. Last Vitamin D was 64 on 08/03/2014.   Names of Other Physician/Practitioners you currently use: 1. [facility] here for primary care 2. Dr Melissa Noon, eye doctor, last visit 2015 3. Dr Jerold Coombe, DDS, dentist, last visit 2015  Patient Care Team: [name], MD as PCP - General (Internal Medicine) Franchot Gallo, MD as Consulting Physician (Urology) Curt Bears, MD as Consulting Physician (Oncology) Lorretta Harp, MD as Consulting Physician (Cardiology) Inda Castle, MD as Consulting Physician (Gastroenterology) Melissa Noon, OD as Referring Physician (Optometry)  Medication Review: Medication Sig  . albuterol  HFA  inhaler Inhale 1-2 puffs  every 6 (six) hours as needed .  Marland Kitchen aspirin 81 MG tablet Take  daily.  . cetirizine (ZYRTEC) 10 MG tablet One tab daily for allergies  . VITAMIN D 2000 UNITS  Take 8,000 Units  daily.   . Multiple Vitamin w/ MINERALS Take 1 tab every evening.   Marland Kitchen FISH OIL 1000 MG CAPS Take 1 cap every evening.   . VitaPulse  1 tab every other day.  . pravastatin  40 MG tablet Take 1 tablet  at bedtime.  Marland Kitchen terazosin  10 MG capsule TAKE ONE CAP ONCE DAILY  . vitamin C  500 MG tablet Take 500 mg daily.   Allergies  Allergen Reactions  . Nitrofuran Derivatives Shortness Of Breath  . Macrobid [Nitrofurantoin Macrocrystal]     Headache   Current Problems (verified) Patient Active Problem List   Diagnosis Date Noted  . BMI 29.0-29.9,adult 11/03/2014  . History of lung cancer 01/20/2014  . Obesity 01/20/2014  . Medication management 03/25/2013  . Prediabetes 03/25/2013  . Hyperlipidemia   . COPD (chronic obstructive pulmonary disease)   . Hypertension   . Vitamin D deficiency     Screening Tests Health Maintenance  Topic Date Due  . INFLUENZA VACCINE  09/07/2014  . TETANUS/TDAP  03/09/2020  . ZOSTAVAX  Completed  . PNA vac Low  Risk Adult  Completed    Immunization History  Administered Date(s) Administered  . Influenza, High Dose Seasonal PF 11/03/2014  . Influenza-Unspecified 11/06/2012  . Pneumococcal Conjugate-13 01/20/2014  . Pneumococcal-Unspecified 02/06/2002, 09/23/2012  . Tdap 03/09/2010  . Zoster 08/03/2014    Preventative care: Last colonoscopy: 05/09/2004  Past Medical History  Diagnosis Date  . BPH (benign prostatic hypertrophy)   . Hyperlipidemia   . COPD (chronic obstructive pulmonary disease)   . History of kidney stones   . Bladder tumor   . Impaired hearing bilateral aids  . History of lung cancer 2008  S/P RIGHT UPPER LOPECTOMY     STAGE I  NON-SMALL CELL LUNG CANCER--  ONCOLOGIST- DR MOHAMED-   NO RECURRENCE  . Hypertension   . Unspecified vitamin D deficiency   . Cancer     bladder, prostate, lung    Past Surgical History  Procedure Laterality Date  . Lung removal, partial  2008  . Lithotripsy  2012  . Fiberoptic bronchoscopy  10-28-2008  . Right upper lung lobectomy  09-21-2006  DR BURNEY    NON-SMALL CELL CANCER/ SEVERE COPD  . Transurethral resection of prostate  12/25/2011    Procedure: TRANSURETHRAL RESECTION OF THE PROSTATE WITH GYRUS INSTRUMENTS;  Surgeon: Franchot Gallo,  MD;  Location: [place];  Service: Urology;  Laterality: N/A;  2 HRS   . Transurethral resection of bladder tumor  12/25/2011    Procedure: TRANSURETHRAL RESECTION OF BLADDER TUMOR (TURBT);  Surgeon: Franchot Gallo, MD;  [facility];  Service: Urology;  Laterality: N/A;  . Prostate surgery      prostatectomy    Risk Factors: Tobacco Social History  Substance Use Topics  . Smoking status: Former Smoker -- 1.00 packs/day for 35 years    Types: Cigarettes    Quit date: 02/07/2004  . Smokeless tobacco: Never Used  . Alcohol Use: Yes     Comment: socially   He does not smoke.  Patient is a former smoker (quit 2006) Are there smokers in your  home (other than you)?  No  Alcohol Current alcohol use: social drinker  Caffeine Current caffeine use: coffee 1 cup /day  Exercise Current exercise: walking and Gym 3 x/wk, linedances  Nutrition/Diet Current diet: in general, a "healthy" diet    Cardiac risk factors: advanced age (older than 87 for men, 13 for women), dyslipidemia, hypertension, male gender, sedentary lifestyle and smoking/ tobacco exposure.  Depression Screen (Note: if answer to either of the following is "Yes", a more complete depression screening is indicated)   Q1: Over the past two weeks, have you felt down, depressed or hopeless? No  Q2: Over the past two weeks, have you felt little interest or pleasure in doing things? No  Have you lost interest or pleasure in daily life? No  Do you often feel hopeless? No  Do you cry easily over simple problems? No  Activities of Daily Living In your present state of health, do you have any difficulty performing the following activities?:  Driving? No Managing money?  No Feeding yourself? No Getting from bed to chair? No Climbing a flight of stairs? No Preparing food and eating?: No Bathing or showering? No Getting dressed: No Getting to the toilet? No Using the toilet:No Moving around from place to place: No In the past year have you fallen or had a near fall?:No   Are you sexually active?  No  Do you have more than one partner?  No  Vision Difficulties: No  Hearing Difficulties: No Do you often ask people to speak up or repeat themselves? No Do you experience ringing or noises in your ears? No Do you have difficulty understanding soft or whispered voices? No  Cognition  Do you feel that you have a problem with memory?No  Do you often misplace items? No  Do you feel safe at home?  Yes  Advanced directives Does patient have a [place]? No - given forms Does patient have a Living Will? No - given forms  ROS: Constitutional:  Denies fever, chills, weight loss/gain, headaches, insomnia, fatigue, night sweats or change in appetite. Eyes: Denies redness, blurred vision, diplopia, discharge, itchy or watery eyes.  ENT: Denies discharge, congestion, post nasal drip, epistaxis, sore throat, earache, hearing loss, dental pain, Tinnitus, Vertigo, Sinus pain or snoring.  Cardio: Denies chest pain, palpitations, irregular heartbeat, syncope, dyspnea, diaphoresis, orthopnea, PND, claudication or edema Respiratory: denies cough, dyspnea, DOE, pleurisy, hoarseness, laryngitis or wheezing.  Gastrointestinal: Denies dysphagia, heartburn, reflux, water brash, pain, cramps, nausea, vomiting, bloating, diarrhea, constipation, hematemesis, melena, hematochezia, jaundice or hemorrhoids Genitourinary: Denies dysuria, frequency, urgency, discharge, hematuria or flank pain, but has nocturia x 2-3 and (+) hesitancy, Musculoskeletal: Denies arthralgia, myalgia, stiffness, Jt. Swelling,  pain, limp or strain/sprain. Denies Falls. Skin: Denies puritis, rash, hives, warts, acne, eczema or change in skin lesion Neuro: No weakness, tremor, incoordination, spasms, paresthesia or pain Psychiatric: Denies confusion, memory loss or sensory loss. Denies Depression. Endocrine: Denies change in weight, skin, hair change, nocturia, and paresthesia, diabetic polys, visual blurring or hyper / hypo glycemic episodes.  Heme/Lymph: No excessive bleeding, bruising or enlarged lymph nodes.  Objective:     BP 140/62 mmHg  Pulse 68  Temp(Src) 97.7 F (36.5 C)  Resp 16  Ht 5' 4.5" (1.638 m)  Wt 174 lb 3.2 oz (79.017 kg)  BMI 29.45 kg/m2  General Appearance:  Alert  WD/WN, male  in no apparent distress. Eyes: PERRLA, EOMs nl, conjunctiva normal, normal fundi and vessels. Sinuses: No frontal/maxillary tenderness ENT/Mouth: EACs patent / TMs  nl. Nares clear without erythema, swelling, mucoid exudates. Oral hygiene is good. No erythema, swelling, or exudate.  Tongue normal, non-obstructing. Tonsils not swollen or erythematous. Hearing normal.  Neck: Supple, thyroid normal. No bruits, nodes or JVD. Respiratory: Respiratory effort normal.  BS equal and clear bilateral without rales, rhonci, wheezing or stridor. Cardio: Heart sounds are normal with regular rate and rhythm and no murmurs, rubs or gallops. Peripheral pulses are normal and equal bilaterally without edema. No aortic or femoral bruits. Chest: symmetric with normal excursions and percussion.  Abdomen: Flat, soft, with nl bowel sounds. Nontender, no guarding, rebound, hernias, masses, or organomegaly.  Lymphatics: Non tender without lymphadenopathy.  Genitourinary: No hernias.Testes nl. DRE - prostate nl for age - smooth & firm w/o nodules. Musculoskeletal: Full ROM all peripheral extremities, joint stability, 5/5 strength, and normal gait. Skin: Warm and dry without rashes, lesions, cyanosis, clubbing or  ecchymosis.  Neuro: Cranial nerves intact, reflexes equal bilaterally. Normal muscle tone, no cerebellar symptoms. Sensation intact.  Pysch: Alert and oriented X 3 with normal affect, insight and judgment appropriate.   Cognitive Testing  Alert? Yes  Normal Appearance? Yes  Oriented to person? Yes  Place? Yes   Time? Yes  Recall of three objects?  Yes  Can perform simple calculations? Yes  Displays appropriate judgment? Yes  Can read the correct time from a watch/clock? Yes  Medicare Attestation I have personally reviewed: The patient's medical and social history Their use of alcohol, tobacco or illicit drugs Their current medications and supplements The patient's functional ability including ADLs,fall risks, home safety risks, cognitive, and hearing and visual impairment Diet and physical activities Evidence for depression or mood disorders  The patient's weight, height, BMI, and visual acuity have been recorded in the chart.  I have made referrals, counseling, and provided  education to the patient based on review of the above and I have provided the patient with a written personalized care plan for preventive services.  Over 40 minutes of exam, counseling, chart review was performed.  MCKEOWN,WILLIAM DAVID, MD   11/03/2014

## 2014-11-04 LAB — URINALYSIS, ROUTINE W REFLEX MICROSCOPIC
BILIRUBIN URINE: NEGATIVE
Glucose, UA: NEGATIVE
KETONES UR: NEGATIVE
Leukocytes, UA: NEGATIVE
NITRITE: NEGATIVE
PH: 7 (ref 5.0–8.0)
Specific Gravity, Urine: 1.02 (ref 1.001–1.035)

## 2014-11-04 LAB — HEMOGLOBIN A1C
Hgb A1c MFr Bld: 5.7 % — ABNORMAL HIGH (ref <5.7)
MEAN PLASMA GLUCOSE: 117 mg/dL — AB (ref <117)

## 2014-11-04 LAB — INSULIN, RANDOM: INSULIN: 7 u[IU]/mL (ref 2.0–19.6)

## 2014-11-04 LAB — MICROALBUMIN / CREATININE URINE RATIO
CREATININE, URINE: 188.4 mg/dL
MICROALB UR: 2.5 mg/dL — AB (ref <2.0)
MICROALB/CREAT RATIO: 13.3 mg/g (ref 0.0–30.0)

## 2014-11-04 LAB — URINALYSIS, MICROSCOPIC ONLY
BACTERIA UA: NONE SEEN [HPF]
CASTS: NONE SEEN [LPF]
Crystals: NONE SEEN [HPF]
SQUAMOUS EPITHELIAL / LPF: NONE SEEN [HPF] (ref <=5)
YEAST: NONE SEEN [HPF]

## 2014-11-04 LAB — VITAMIN D 25 HYDROXY (VIT D DEFICIENCY, FRACTURES): Vit D, 25-Hydroxy: 62 ng/mL (ref 30–100)

## 2014-11-04 LAB — PSA: PSA: 0.48 ng/mL (ref <=4.00)

## 2014-11-13 ENCOUNTER — Encounter: Payer: Self-pay | Admitting: Gastroenterology

## 2014-11-30 DIAGNOSIS — N200 Calculus of kidney: Secondary | ICD-10-CM | POA: Diagnosis not present

## 2014-11-30 DIAGNOSIS — R310 Gross hematuria: Secondary | ICD-10-CM | POA: Diagnosis not present

## 2014-11-30 DIAGNOSIS — C670 Malignant neoplasm of trigone of bladder: Secondary | ICD-10-CM | POA: Diagnosis not present

## 2014-11-30 DIAGNOSIS — R3129 Other microscopic hematuria: Secondary | ICD-10-CM | POA: Diagnosis not present

## 2014-11-30 DIAGNOSIS — N201 Calculus of ureter: Secondary | ICD-10-CM | POA: Diagnosis not present

## 2014-12-13 ENCOUNTER — Other Ambulatory Visit: Payer: Self-pay | Admitting: Internal Medicine

## 2015-01-06 ENCOUNTER — Ambulatory Visit (AMBULATORY_SURGERY_CENTER): Payer: Self-pay | Admitting: *Deleted

## 2015-01-06 VITALS — Ht 66.0 in | Wt 179.8 lb

## 2015-01-06 DIAGNOSIS — Z1211 Encounter for screening for malignant neoplasm of colon: Secondary | ICD-10-CM

## 2015-01-06 MED ORDER — NA SULFATE-K SULFATE-MG SULF 17.5-3.13-1.6 GM/177ML PO SOLN: 1.0000 | Freq: Once | ORAL | Status: DC

## 2015-01-06 NOTE — Progress Notes (Signed)
No egg or soy allergy No issues with past sedation No diet pills No home 02 use

## 2015-01-13 DIAGNOSIS — N201 Calculus of ureter: Secondary | ICD-10-CM | POA: Diagnosis not present

## 2015-01-13 DIAGNOSIS — C670 Malignant neoplasm of trigone of bladder: Secondary | ICD-10-CM | POA: Diagnosis not present

## 2015-01-18 ENCOUNTER — Telehealth: Payer: Self-pay | Admitting: Gastroenterology

## 2015-01-18 ENCOUNTER — Encounter: Payer: Self-pay | Admitting: *Deleted

## 2015-01-18 NOTE — Telephone Encounter (Signed)
Patient is coughing and has temperature of 100.3. Rescheduled procedure to 01/26/15 at 10:30 AM. New instructions mailed to patient.

## 2015-01-20 ENCOUNTER — Ambulatory Visit (INDEPENDENT_AMBULATORY_CARE_PROVIDER_SITE_OTHER): Payer: Medicare Other | Admitting: Internal Medicine

## 2015-01-20 ENCOUNTER — Encounter: Payer: PRIVATE HEALTH INSURANCE | Admitting: Gastroenterology

## 2015-01-20 ENCOUNTER — Encounter: Payer: Self-pay | Admitting: Internal Medicine

## 2015-01-20 VITALS — BP 136/62 | HR 94 | Temp 98.4°F | Resp 20 | Ht 64.5 in | Wt 174.0 lb

## 2015-01-20 DIAGNOSIS — J069 Acute upper respiratory infection, unspecified: Secondary | ICD-10-CM

## 2015-01-20 MED ORDER — PREDNISONE 20 MG PO TABS: ORAL_TABLET | ORAL | Status: DC

## 2015-01-20 MED ORDER — PROMETHAZINE-DM 6.25-15 MG/5ML PO SYRP: 5.0000 mL | ORAL_SOLUTION | Freq: Four times a day (QID) | ORAL | Status: DC | PRN

## 2015-01-20 MED ORDER — ERYTHROMYCIN 5 MG/GM OP OINT: 1.0000 "application " | TOPICAL_OINTMENT | Freq: Two times a day (BID) | OPHTHALMIC | Status: DC

## 2015-01-20 MED ORDER — AZITHROMYCIN 250 MG PO TABS: ORAL_TABLET | ORAL | Status: DC

## 2015-01-20 NOTE — Patient Instructions (Signed)
Please take zyrtec nightly at bedtime until this resolves.  Please use your ipatropium nasal spray three times daily as needed for congestion.  Please use eye ointment twice daily on your eye.    Take the zpak until it is gone  Please take the prednisone until it is gone  Use your inhaler every six hours as needed for shortness of breath and coughing.    Please use saline in your nose as often as possible.

## 2015-01-20 NOTE — Progress Notes (Signed)
Patient ID: Raymond Patrick, male   DOB: 1935-03-15, 79 y.o.   MRN: [id number]  HPI  Patient presents to the office for evaluation of cough.  It has been going on for 5 days.  Patient reports night > day, wet, worse with lying down, yellow phlegm production.  They also endorse change in voice, chills, postnasal drip, shortness of breath, sputum production and nasal congestion. .  They have tried mucinex, tylenol, inhaler.  They report that nothing has worked.  They admits to other sick contacts.  Review of Systems  Constitutional: Negative for fever, chills and malaise/fatigue.  HENT: Positive for congestion, ear pain and sore throat.   Respiratory: Positive for cough, sputum production, shortness of breath and wheezing.   Cardiovascular: Negative for chest pain, palpitations and leg swelling.  Skin: Negative.   Neurological: Negative for headaches.    PE:  Filed Vitals:   01/20/15 1527  BP: 136/62  Pulse: 94  Temp: 98.4 F (36.9 C)  Resp: 20    General:  Alert and non-toxic, WDWN, NAD HEENT: NCAT, PERLA, EOM normal, no occular discharge or erythema.  Nasal mucosal edema with sinus tenderness to palpation.  Oropharynx clear with minimal oropharyngeal edema and erythema.  Mucous membranes moist and pink. Neck:  Cervical adenopathy Chest:  RRR no MRGs.  Lungs clear to auscultation A&P with no wheezes rhonchi or rales.   Abdomen: +BS x 4 quadrants, soft, non-tender, no guarding, rigidity, or rebound. Skin: warm and dry no rash Neuro: A&Ox4, CN II-XII grossly intact  Assessment and Plan:   1. Acute URI -zyrtec -ipatropium nasal spray -prednisone -schedule 6 hr nebs -zpak -nasal saline -phenergan dextromethorphan

## 2015-01-26 ENCOUNTER — Encounter: Payer: Self-pay | Admitting: Gastroenterology

## 2015-01-26 ENCOUNTER — Ambulatory Visit (AMBULATORY_SURGERY_CENTER): Payer: Medicare Other | Admitting: Gastroenterology

## 2015-01-26 VITALS — BP 154/71 | HR 68 | Temp 96.5°F | Resp 24 | Ht 66.0 in | Wt 179.0 lb

## 2015-01-26 DIAGNOSIS — D120 Benign neoplasm of cecum: Secondary | ICD-10-CM | POA: Diagnosis not present

## 2015-01-26 DIAGNOSIS — I10 Essential (primary) hypertension: Secondary | ICD-10-CM | POA: Diagnosis not present

## 2015-01-26 DIAGNOSIS — Z1211 Encounter for screening for malignant neoplasm of colon: Secondary | ICD-10-CM

## 2015-01-26 DIAGNOSIS — D123 Benign neoplasm of transverse colon: Secondary | ICD-10-CM | POA: Diagnosis not present

## 2015-01-26 DIAGNOSIS — D122 Benign neoplasm of ascending colon: Secondary | ICD-10-CM | POA: Diagnosis not present

## 2015-01-26 DIAGNOSIS — J449 Chronic obstructive pulmonary disease, unspecified: Secondary | ICD-10-CM | POA: Diagnosis not present

## 2015-01-26 MED ORDER — SODIUM CHLORIDE 0.9 % IV SOLN: 500.0000 mL | INTRAVENOUS | Status: DC

## 2015-01-26 NOTE — Patient Instructions (Signed)
YOU HAD AN ENDOSCOPIC PROCEDURE TODAY AT [facility]:   Refer to the procedure report that was given to you for any specific questions about what was found during the examination.  If the procedure report does not answer your questions, please call your gastroenterologist to clarify.  If you requested that your care partner not be given the details of your procedure findings, then the procedure report has been included in a sealed envelope for you to review at your convenience later.  YOU SHOULD EXPECT: Some feelings of bloating in the abdomen. Passage of more gas than usual.  Walking can help get rid of the air that was put into your GI tract during the procedure and reduce the bloating. If you had a lower endoscopy (such as a colonoscopy or flexible sigmoidoscopy) you may notice spotting of blood in your stool or on the toilet paper. If you underwent a bowel prep for your procedure, you may not have a normal bowel movement for a few days.  Please Note:  You might notice some irritation and congestion in your nose or some drainage.  This is from the oxygen used during your procedure.  There is no need for concern and it should clear up in a day or so.  SYMPTOMS TO REPORT IMMEDIATELY:   Following lower endoscopy (colonoscopy or flexible sigmoidoscopy):  Excessive amounts of blood in the stool  Significant tenderness or worsening of abdominal pains  Swelling of the abdomen that is new, acute  Fever of 100F or higher   For urgent or emergent issues, a gastroenterologist can be reached at any hour by calling [phone].   DIET: Your first meal following the procedure should be a small meal and then it is ok to progress to your normal diet. Heavy or fried foods are harder to digest and may make you feel nauseous or bloated.  Likewise, meals heavy in dairy and vegetables can increase bloating.  Drink plenty of fluids but you should avoid alcoholic beverages for 24  hours.  ACTIVITY:  You should plan to take it easy for the rest of today and you should NOT DRIVE or use heavy machinery until tomorrow (because of the sedation medicines used during the test).    FOLLOW UP: Our staff will call the number listed on your records the next business day following your procedure to check on you and address any questions or concerns that you may have regarding the information given to you following your procedure. If we do not reach you, we will leave a message.  However, if you are feeling well and you are not experiencing any problems, there is no need to return our call.  We will assume that you have returned to your regular daily activities without incident.  If any biopsies were taken you will be contacted by phone or by letter within the next 1-3 weeks.  Please call us at [phone] if you have not heard about the biopsies in 3 weeks.    SIGNATURES/CONFIDENTIALITY: You and/or your care partner have signed paperwork which will be entered into your electronic medical record.  These signatures attest to the fact that that the information above on your After Visit Summary has been reviewed and is understood.  Full responsibility of the confidentiality of this discharge information lies with you and/or your care-partner.  Hold Aspirin, Advil, Aleve, NSAIDS x2 weeks Polyps/ Diverticulosis handout given

## 2015-01-26 NOTE — Progress Notes (Signed)
To PACU  Awake and alert, report to RN

## 2015-01-26 NOTE — Progress Notes (Signed)
Called to room to assist during endoscopic procedure.  Patient ID and intended procedure confirmed with present staff. Received instructions for my participation in the procedure from the performing physician.  

## 2015-01-26 NOTE — Progress Notes (Signed)
Patient did not bring his inhaler today.

## 2015-01-26 NOTE — Op Note (Signed)
[place]  [employer] [place], [address]   COLONOSCOPY PROCEDURE REPORT  PATIENT: Tylique, Aull  MR#: [id number] BIRTHDATE: 1935/11/01 , 27  yrs. old GENDER: male ENDOSCOPIST: Yetta Flock, MD REFERRED BY: [name] MD PROCEDURE DATE:  01/26/2015 PROCEDURE:   Colonoscopy, screening, Colonoscopy with snare polypectomy, and Colonoscopy with biopsy First Screening Colonoscopy - Avg.  risk and is 50 yrs.  old or older - No.  Prior Negative Screening - Now for repeat screening. 10 or more years since last screening  History of Adenoma - Now for follow-up colonoscopy & has been > or = to 3 yrs.  N/A  Polyps removed today? Yes ASA CLASS:   Class III INDICATIONS:Screening for colonic neoplasia and Colorectal Neoplasm Risk Assessment for this procedure is average risk. MEDICATIONS: Propofol 400 mg IV  DESCRIPTION OF PROCEDURE:   After the risks benefits and alternatives of the procedure were thoroughly explained, informed consent was obtained.  The digital rectal exam revealed no abnormalities of the rectum.   The [id number]  endoscope was introduced through the anus and advanced to the cecum, which was identified by both the appendix and ileocecal valve. No adverse events experienced.   The quality of the prep was adequate  The instrument was then slowly withdrawn as the colon was fully examined. Estimated blood loss is zero unless otherwise noted in this procedure report.     COLON FINDINGS: There was severe diverticulosis with muscular hypertrophy of the sigmoid colon, along with significant looping, making for a technically challenging cecal intubation.  A 8-59m sessile polyp was noted in the cecum and removed with hot snare.  A 8-180msessile polyp was noted in the ascending colon and removed with hot snare.  A 26m68messile polyp was noted in the ascending colon and removed with cold forceps.  Due to looping it was technically  challenging to maintain good positioning for polypectomies in the right colon.  A 3mm75mssile polyp was noted in the hepatic flexure and removed with cold forceps.  A 26mm 109msile polyp was noted in the transverse colon and removed with cold forceps.  The remainder of the examined colon was normal. Retroflexed views revealed internal hemorrhoids. The time to cecum = 7.6 Withdrawal time = 33.1   The scope was withdrawn and the procedure completed. COMPLICATIONS: There were no immediate complications.  ENDOSCOPIC IMPRESSION: Severe diverticulosis of the sigmoid colon, with significant looping during cecal intubation Multiple colon polyps removed as outlined above  RECOMMENDATIONS: No aspirin or NSAIDs for 2 weeks post-polypectomy Resume diet Resume medications Await pathology results  eSigned:  SteveYetta Flock12/20/2016 11:03 AM   cc: WilliUnk Pintothe patient   PATIENT NAME:  GarneCorin, Tilly [id number]

## 2015-01-27 ENCOUNTER — Telehealth: Payer: Self-pay | Admitting: *Deleted

## 2015-01-27 NOTE — Telephone Encounter (Signed)
  Follow up Call-  Call back number 01/26/2015  Post procedure Call Back phone  # [phone]  Permission to leave phone message Yes     Patient questions:  Do you have a fever, pain , or abdominal swelling? No. Pain Score  0 *  Have you tolerated food without any problems? Yes.    Have you been able to return to your normal activities? Yes.    Do you have any questions about your discharge instructions: Diet   No. Medications  No. Follow up visit  No.  Do you have questions or concerns about your Care? No.  Actions: * If pain score is 4 or above: No action needed, pain <4.

## 2015-02-03 ENCOUNTER — Encounter: Payer: Self-pay | Admitting: Gastroenterology

## 2015-02-05 ENCOUNTER — Encounter: Payer: Self-pay | Admitting: *Deleted

## 2015-02-10 ENCOUNTER — Encounter: Payer: Self-pay | Admitting: Internal Medicine

## 2015-02-10 ENCOUNTER — Ambulatory Visit (INDEPENDENT_AMBULATORY_CARE_PROVIDER_SITE_OTHER): Payer: PPO | Admitting: Internal Medicine

## 2015-02-10 VITALS — BP 118/62 | HR 72 | Temp 98.0°F | Resp 16 | Ht 64.0 in | Wt 174.0 lb

## 2015-02-10 DIAGNOSIS — I10 Essential (primary) hypertension: Secondary | ICD-10-CM | POA: Diagnosis not present

## 2015-02-10 DIAGNOSIS — Z0001 Encounter for general adult medical examination with abnormal findings: Secondary | ICD-10-CM

## 2015-02-10 DIAGNOSIS — Z0000 Encounter for general adult medical examination without abnormal findings: Secondary | ICD-10-CM

## 2015-02-10 DIAGNOSIS — Z79899 Other long term (current) drug therapy: Secondary | ICD-10-CM

## 2015-02-10 DIAGNOSIS — E782 Mixed hyperlipidemia: Secondary | ICD-10-CM

## 2015-02-10 DIAGNOSIS — R7303 Prediabetes: Secondary | ICD-10-CM

## 2015-02-10 DIAGNOSIS — E669 Obesity, unspecified: Secondary | ICD-10-CM

## 2015-02-10 DIAGNOSIS — Z6829 Body mass index (BMI) 29.0-29.9, adult: Secondary | ICD-10-CM | POA: Diagnosis not present

## 2015-02-10 DIAGNOSIS — J449 Chronic obstructive pulmonary disease, unspecified: Secondary | ICD-10-CM | POA: Diagnosis not present

## 2015-02-10 DIAGNOSIS — Z85118 Personal history of other malignant neoplasm of bronchus and lung: Secondary | ICD-10-CM | POA: Diagnosis not present

## 2015-02-10 DIAGNOSIS — R6889 Other general symptoms and signs: Secondary | ICD-10-CM

## 2015-02-10 DIAGNOSIS — E559 Vitamin D deficiency, unspecified: Secondary | ICD-10-CM | POA: Diagnosis not present

## 2015-02-10 MED ORDER — CETIRIZINE HCL 10 MG PO TABS: 10.0000 mg | ORAL_TABLET | Freq: Every day | ORAL | Status: DC

## 2015-02-10 NOTE — Progress Notes (Signed)
Patient ID: EBERT FORRESTER, male   DOB: August 13, 1935, 80 y.o.   MRN: [id number]  MEDICARE ANNUAL WELLNESS VISIT AND FOLLOW UP Assessment:    1. Essential hypertension -cont monitor -DASH diet -call office greater than 150/90 at home  2. Hyperlipidemia -cont pravastatin -diet and exercise  3. Prediabetes -cont diet and exercise  4. Vitamin D deficiency -cont vit D  5. Medication management    6. History of lung cancer -monitored by Oncology  7. Chronic obstructive pulmonary disease, unspecified COPD type (HCC) -albuterol prn  8. Obesity -diet and exercise  9. BMI 29.0-29.9,adult   10. Medicare annual wellness visit, subsequent -due next year January 2018     Over 30 minutes of exam, counseling, chart review, and critical decision making was performed  Plan:   During the course of the visit the patient was educated and counseled about appropriate screening and preventive services including:    Pneumococcal vaccine   Influenza vaccine  Prevnar 13  Td vaccine  Screening electrocardiogram  Colorectal cancer screening  Diabetes screening  Glaucoma screening  Nutrition counseling   Conditions/risks identified: BMI: Discussed weight loss, diet, and increase physical activity.  Increase physical activity: AHA recommends 150 minutes of physical activity a week.  Medications reviewed Diabetes is at goal, ACE/ARB therapy: No, Reason not on Ace Inhibitor/ARB therapy:  Not indicated Urinary Incontinence is not an issue: discussed non pharmacology and pharmacology options.  Fall risk: low- discussed PT, home fall assessment, medications.    Subjective:  Raymond Patrick is a 80 y.o. male who presents for Medicare Annual Wellness Visit and 3 month follow up for HTN, hyperlipidemia, prediabetes, and vitamin D Def.  Date of last medicare wellness visit was is unknown.  His blood pressure has been controlled at home, today their BP is BP: 118/62 mmHg He  does workout. He denies chest pain, shortness of breath, dizziness.   He is on cholesterol medication and denies myalgias. His cholesterol is at goal. The cholesterol last visit was:   Lab Results  Component Value Date   CHOL 154 11/03/2014   HDL 41 11/03/2014   LDLCALC 92 11/03/2014   TRIG 103 11/03/2014   CHOLHDL 3.8 11/03/2014   He has been working on diet and exercise for prediabetes, and denies foot ulcerations, hyperglycemia, hypoglycemia , increased appetite, nausea, paresthesia of the feet, polydipsia, polyuria, visual disturbances, vomiting and weight loss. Last A1C in the office was:  Lab Results  Component Value Date   HGBA1C 5.7* 11/03/2014   Last GFR NonAA   Lab Results  Component Value Date   [facility] 74 11/03/2014   AA  Lab Results  Component Value Date   GFRAA 85 11/03/2014   Patient is on Vitamin D supplement.   Lab Results  Component Value Date   VD25OH 62 11/03/2014      Medication Review: Current Outpatient Prescriptions on File Prior to Visit  Medication Sig Dispense Refill  . albuterol (PROVENTIL HFA;VENTOLIN HFA) 108 (90 BASE) MCG/ACT inhaler Inhale 1-2 puffs into the lungs every 6 (six) hours as needed for wheezing or shortness of breath. 18 g 2  . aspirin 81 MG tablet Take 81 mg by mouth daily.    . Cholecalciferol (VITAMIN D) 2000 UNITS CAPS Take 8,000 Units by mouth daily.     . Multiple Vitamin (MULTIVITAMIN WITH MINERALS) TABS Take 1 tablet by mouth every evening.     . Omega-3 Fatty Acids (FISH OIL) 1000 MG CAPS Take 1 capsule by mouth  every evening.     Marland Kitchen OVER THE COUNTER MEDICATION Reported on 01/26/2015    . pravastatin (PRAVACHOL) 40 MG tablet TAKE ONE TABLET BY MOUTH AT BEDTIME 90 tablet 1  . terazosin (HYTRIN) 10 MG capsule TAKE ONE CAPSULE BY MOUTH ONCE DAILY 90 capsule 1  . vitamin C (ASCORBIC ACID) 500 MG tablet Take 500 mg by mouth daily.     No current facility-administered medications on file prior to visit.    Current  Problems (verified) Patient Active Problem List   Diagnosis Date Noted  . BMI 29.0-29.9,adult 11/03/2014  . Medicare annual wellness visit, subsequent 11/03/2014  . History of lung cancer 01/20/2014  . Obesity 01/20/2014  . Medication management 03/25/2013  . Prediabetes 03/25/2013  . Hyperlipidemia   . COPD (chronic obstructive pulmonary disease) ([place])   . Hypertension   . Vitamin D deficiency     Screening Tests Immunization History  Administered Date(s) Administered  . Influenza, High Dose Seasonal PF 11/03/2014  . Influenza-Unspecified 11/06/2012  . Pneumococcal Conjugate-13 01/20/2014  . Pneumococcal-Unspecified 02/06/2002, 09/23/2012  . Tdap 03/09/2010  . Zoster 08/03/2014    Preventative care: Last colonoscopy: 01/26/15  Prior vaccinations: TD or Tdap: 2012 Influenza: 2016  Pneumococcal: 2014 Prevnar13: 2015 Shingles/Zostavax: 2016  Names of Other Physician/Practitioners you currently use: 1. [facility] here for primary care 2. Dr. Delman Cheadle, eye doctor, last visit 2015 3. Dr. Derenda Mis, dentist, last visit 2016 Patient Care Team: [name], MD as PCP - General (Internal Medicine) Franchot Gallo, MD as Consulting Physician (Urology) Curt Bears, MD as Consulting Physician (Oncology) Lorretta Harp, MD as Consulting Physician (Cardiology) Inda Castle, MD as Consulting Physician (Gastroenterology) Melissa Noon, OD as Referring Physician (Optometry)  Past Surgical History  Procedure Laterality Date  . Lung removal, partial  2008  . Lithotripsy  2012  . Fiberoptic bronchoscopy  10-28-2008  . Right upper lung lobectomy  09-21-2006  DR BURNEY    NON-SMALL CELL CANCER/ SEVERE COPD  . Transurethral resection of prostate  12/25/2011    Procedure: TRANSURETHRAL RESECTION OF THE PROSTATE WITH GYRUS INSTRUMENTS;  Surgeon: Franchot Gallo, MD;  Location: [facility];  Service: Urology;  Laterality:  N/A;  2 HRS   . Transurethral resection of bladder tumor  12/25/2011    Procedure: TRANSURETHRAL RESECTION OF BLADDER TUMOR (TURBT);  Surgeon: Franchot Gallo, MD;  Location: [facility];  Service: Urology;  Laterality: N/A;  . Prostate surgery      prostatectomy  . Colonoscopy  05-09-2004    HPP and tics Deatra Ina    Family History  Problem Relation Age of Onset  . Cancer Mother     Non-Hodgkin's Lymphoma  . Diabetes Mother   . Hypertension Mother   . Colon polyps Mother   . Cancer Father     Colon cancer w/ metastasis to lung & bone  . Colon cancer Father   . Cancer Brother 59    Lung cancer  . Diabetes Maternal Grandmother   . Heart failure Maternal Grandmother   . Cancer Brother 65    Non-small cell Lung cancer  . Pneumonia Maternal Grandfather   . Esophageal cancer Neg Hx   . Rectal cancer Neg Hx   . Stomach cancer Neg Hx    Social History  Substance Use Topics  . Smoking status: Former Smoker -- 1.00 packs/day for 35 years    Types: Cigarettes    Quit date: 02/07/2004  . Smokeless tobacco: Never  Used  . Alcohol Use: 0.0 oz/week    0 Standard drinks or equivalent per week     Comment: socially    MEDICARE WELLNESS OBJECTIVES: Tobacco use: He does not smoke.  Patient is a former smoker. If yes, counseling given Alcohol Current alcohol use: social drinker Osteoporosis: hypogonadism, History of fracture in the past year: no Fall risk: Low Risk Hearing: normal Visual acuity: normal,  does perform annual eye exam Diet: in general, a "healthy" diet   Physical activity: Current Exercise Habits:: Home exercise routine, Type of exercise: strength training/weights;walking, Time (Minutes): 60, Frequency (Times/Week): 3, Weekly Exercise (Minutes/Week): 180, Intensity: Moderate Cardiac risk factors: Cardiac Risk Factors include: advanced age (>23mn, >>10women);dyslipidemia;diabetes mellitus;hypertension;male gender;obesity (BMI >30kg/m2) Depression/mood  screen:   Depression screen [facility]2/9 02/10/2015  Decreased Interest 0  Down, Depressed, Hopeless 0  PHQ - 2 Score 0    ADLs:  In your present state of health, do you have any difficulty performing the following activities: 02/10/2015  Hearing? N  Vision? N  Difficulty concentrating or making decisions? N  Walking or climbing stairs? N  Dressing or bathing? N  Doing errands, shopping? N  Preparing Food and eating ? N  Using the Toilet? N  In the past six months, have you accidently leaked urine? N  Do you have problems with loss of bowel control? N  Managing your Medications? N  Managing your Finances? N  Housekeeping or managing your Housekeeping? N     Cognitive Testing  Alert? Yes  Normal Appearance?Yes  Oriented to person? Yes  Place? Yes   Time? Yes  Recall of three objects?  Yes  Can perform simple calculations? Yes  Displays appropriate judgment?Yes  Can read the correct time from a watch face?Yes  EOL planning: Does patient have an advance directive?: No Would patient like information on creating an advanced directive?: No - patient declined information   Objective:   Today's Vitals   02/10/15 0930  BP: 118/62  Pulse: 72  Temp: 98 F (36.7 C)  TempSrc: Temporal  Resp: 16  Height: '5\' 4"'$  (1.626 m)  Weight: 174 lb (78.926 kg)   Body mass index is 29.85 kg/(m^2).  General appearance: alert, no distress, WD/WN, male HEENT: normocephalic, sclerae anicteric, TMs pearly, nares patent, no discharge or erythema, pharynx normal Oral cavity: MMM, no lesions Neck: supple, no lymphadenopathy, no thyromegaly, no masses Heart: RRR, normal S1, S2, no murmurs Lungs: CTA bilaterally, no wheezes, rhonchi, or rales Abdomen: +bs, soft, non tender, non distended, no masses, no hepatomegaly, no splenomegaly Musculoskeletal: nontender, no swelling, no obvious deformity Extremities: no edema, no cyanosis, no clubbing Pulses: 2+ symmetric, upper and lower extremities, normal cap  refill Neurological: alert, oriented x 3, CN2-12 intact, strength normal upper extremities and lower extremities, sensation normal throughout, DTRs 2+ throughout, no cerebellar signs, gait normal Psychiatric: normal affect, behavior normal, pleasant   Medicare Attestation I have personally reviewed: The patient's medical and social history Their use of alcohol, tobacco or illicit drugs Their current medications and supplements The patient's functional ability including ADLs,fall risks, home safety risks, cognitive, and hearing and visual impairment Diet and physical activities Evidence for depression or mood disorders  The patient's weight, height, BMI, and visual acuity have been recorded in the chart.  I have made referrals, counseling, and provided education to the patient based on review of the above and I have provided the patient with a written personalized care plan for preventive services.  Starlyn Skeans, PA-C   02/10/2015

## 2015-02-27 ENCOUNTER — Encounter: Payer: Self-pay | Admitting: *Deleted

## 2015-03-18 ENCOUNTER — Other Ambulatory Visit: Payer: Self-pay | Admitting: Internal Medicine

## 2015-03-22 ENCOUNTER — Other Ambulatory Visit: Payer: Self-pay | Admitting: *Deleted

## 2015-03-22 MED ORDER — PRAVASTATIN SODIUM 40 MG PO TABS: 40.0000 mg | ORAL_TABLET | Freq: Every day | ORAL | Status: DC

## 2015-03-22 MED ORDER — TERAZOSIN HCL 10 MG PO CAPS: 10.0000 mg | ORAL_CAPSULE | Freq: Every day | ORAL | Status: DC

## 2015-03-23 DIAGNOSIS — Z0000 Encounter for general adult medical examination without abnormal findings: Secondary | ICD-10-CM | POA: Diagnosis not present

## 2015-03-23 DIAGNOSIS — R310 Gross hematuria: Secondary | ICD-10-CM | POA: Diagnosis not present

## 2015-03-23 DIAGNOSIS — N200 Calculus of kidney: Secondary | ICD-10-CM | POA: Diagnosis not present

## 2015-04-06 DIAGNOSIS — Z0000 Encounter for general adult medical examination without abnormal findings: Secondary | ICD-10-CM | POA: Diagnosis not present

## 2015-04-06 DIAGNOSIS — N200 Calculus of kidney: Secondary | ICD-10-CM | POA: Diagnosis not present

## 2015-04-19 ENCOUNTER — Encounter: Payer: Self-pay | Admitting: *Deleted

## 2015-04-21 DIAGNOSIS — H1013 Acute atopic conjunctivitis, bilateral: Secondary | ICD-10-CM | POA: Diagnosis not present

## 2015-04-21 DIAGNOSIS — H2513 Age-related nuclear cataract, bilateral: Secondary | ICD-10-CM | POA: Diagnosis not present

## 2015-04-28 DIAGNOSIS — H1013 Acute atopic conjunctivitis, bilateral: Secondary | ICD-10-CM | POA: Diagnosis not present

## 2015-05-21 ENCOUNTER — Ambulatory Visit: Payer: Self-pay | Admitting: Internal Medicine

## 2015-06-04 ENCOUNTER — Ambulatory Visit (INDEPENDENT_AMBULATORY_CARE_PROVIDER_SITE_OTHER): Payer: PPO | Admitting: Internal Medicine

## 2015-06-04 ENCOUNTER — Encounter: Payer: Self-pay | Admitting: Internal Medicine

## 2015-06-04 VITALS — BP 116/66 | HR 76 | Temp 97.0°F | Resp 16 | Ht 64.5 in | Wt 176.0 lb

## 2015-06-04 DIAGNOSIS — E782 Mixed hyperlipidemia: Secondary | ICD-10-CM

## 2015-06-04 DIAGNOSIS — Z79899 Other long term (current) drug therapy: Secondary | ICD-10-CM

## 2015-06-04 DIAGNOSIS — I10 Essential (primary) hypertension: Secondary | ICD-10-CM

## 2015-06-04 DIAGNOSIS — R7303 Prediabetes: Secondary | ICD-10-CM | POA: Diagnosis not present

## 2015-06-04 DIAGNOSIS — E559 Vitamin D deficiency, unspecified: Secondary | ICD-10-CM

## 2015-06-04 DIAGNOSIS — R7309 Other abnormal glucose: Secondary | ICD-10-CM | POA: Diagnosis not present

## 2015-06-04 LAB — HEMOGLOBIN A1C
Hgb A1c MFr Bld: 5.6 % (ref <5.7)
MEAN PLASMA GLUCOSE: 114 mg/dL

## 2015-06-04 LAB — CBC WITH DIFFERENTIAL/PLATELET
BASOS ABS: 65 {cells}/uL (ref 0–200)
Basophils Relative: 1 %
EOS ABS: 130 {cells}/uL (ref 15–500)
Eosinophils Relative: 2 %
HCT: 43.2 % (ref 38.5–50.0)
Hemoglobin: 14.8 g/dL (ref 13.2–17.1)
LYMPHS PCT: 52 %
Lymphs Abs: 3380 cells/uL (ref 850–3900)
MCH: 31.2 pg (ref 27.0–33.0)
MCHC: 34.3 g/dL (ref 32.0–36.0)
MCV: 90.9 fL (ref 80.0–100.0)
MONOS PCT: 6 %
MPV: 10.5 fL (ref 7.5–12.5)
Monocytes Absolute: 390 cells/uL (ref 200–950)
NEUTROS PCT: 39 %
Neutro Abs: 2535 cells/uL (ref 1500–7800)
PLATELETS: 200 10*3/uL (ref 140–400)
RBC: 4.75 MIL/uL (ref 4.20–5.80)
RDW: 13.2 % (ref 11.0–15.0)
WBC: 6.5 10*3/uL (ref 3.8–10.8)

## 2015-06-04 LAB — BASIC METABOLIC PANEL WITH GFR
BUN: 9 mg/dL (ref 7–25)
CO2: 26 mmol/L (ref 20–31)
CREATININE: 0.96 mg/dL (ref 0.70–1.18)
Calcium: 9.6 mg/dL (ref 8.6–10.3)
Chloride: 106 mmol/L (ref 98–110)
GFR, EST AFRICAN AMERICAN: 88 mL/min (ref >=60)
GFR, Est Non African American: 76 mL/min (ref >=60)
GLUCOSE: 94 mg/dL (ref 65–99)
Potassium: 4.3 mmol/L (ref 3.5–5.3)
Sodium: 141 mmol/L (ref 135–146)

## 2015-06-04 LAB — HEPATIC FUNCTION PANEL
ALBUMIN: 4.1 g/dL (ref 3.6–5.1)
ALT: 17 U/L (ref 9–46)
AST: 20 U/L (ref 10–35)
Alkaline Phosphatase: 76 U/L (ref 40–115)
BILIRUBIN TOTAL: 0.6 mg/dL (ref 0.2–1.2)
Bilirubin, Direct: 0.1 mg/dL (ref <=0.2)
Indirect Bilirubin: 0.5 mg/dL (ref 0.2–1.2)
TOTAL PROTEIN: 6.4 g/dL (ref 6.1–8.1)

## 2015-06-04 LAB — LIPID PANEL
Cholesterol: 166 mg/dL (ref 125–200)
HDL: 47 mg/dL (ref >=40)
LDL Cholesterol: 101 mg/dL (ref <130)
TRIGLYCERIDES: 89 mg/dL (ref <150)
Total CHOL/HDL Ratio: 3.5 Ratio (ref <=5.0)
VLDL: 18 mg/dL (ref <30)

## 2015-06-04 LAB — MAGNESIUM: MAGNESIUM: 2.1 mg/dL (ref 1.5–2.5)

## 2015-06-04 LAB — TSH: TSH: 1.21 m[IU]/L (ref 0.40–4.50)

## 2015-06-04 NOTE — Patient Instructions (Signed)

## 2015-06-04 NOTE — Progress Notes (Signed)
Patient ID: Raymond Patrick, male   DOB: 1935/06/01, 80 y.o.   MRN: [id number]   This very nice 80 y.o. Single WM presents for 6 month follow up with Hypertension, Hyperlipidemia, Pre-Diabetes and Vitamin D Deficiency. O(ther significant hx is that of RUL Lung Cancer in 2008 and Bladder & Prostate Cancer in 2014.   Patient is treated for HTN  Circa 1998 & BP has been controlled at home. Today's BP: 116/66 mmHg. Patient has had no complaints of any cardiac type chest pain, palpitations, dyspnea/orthopnea/PND, dizziness, claudication, or dependent edema.   Hyperlipidemia is controlled with diet & meds. Patient denies myalgias or other med SE's. Last Lipids were at goal with Cholesterol 154; HDL 41; LDL 92; Triglycerides 103 on 11/03/2014.   Also, the patient has history of PreDiabetes and has had no symptoms of reactive hypoglycemia, diabetic polys, paresthesias or visual blurring.  Last A1c was  5.7% on 11/03/2014.   Further, the patient also has history of Vitamin D Deficiency of "30" in 2008 and supplements vitamin D without any suspected side-effects. Last vitamin D was 62 on 11/03/2014.   Medication Sig  . albuterol  HFA inhaler Inhale 1-2 puffs into the lungs every 6 (six) hours as needed for wheezing or shortness of breath.  Marland Kitchen aspirin 81 MG tablet Take 81 mg by mouth daily.  . cetirizine (ZYRTEC) 10 MG  Take 1 tablet (10 mg total) by mouth daily. One tab daily for allergies  . VITAMIN D 2000 UNITS  Take 8,000 Units by mouth daily.   . MULTI-VIT w/ MINERALS Take 1 tablet by mouth every evening.   . Omega-3 FISH OIL 1000 MG  Take 1 capsule by mouth every evening.   . pravastatin \ 40 MG  Take 1 tablet (40 mg total) by mouth at bedtime.  Marland Kitchen terazosin  10 MG  Take 1 capsule (10 mg total) by mouth daily.  . vitamin C 500 MG t Take 500 mg by mouth daily.   Allergies  Allergen Reactions  . Nitrofuran Derivatives Shortness Of Breath    macrobid   PMHx:   Past Medical History  Diagnosis Date   . BPH (benign prostatic hypertrophy)   . Hyperlipidemia   . COPD (chronic obstructive pulmonary disease) ([place])   . History of kidney stones   . Bladder tumor   . Impaired hearing bilateral aids  . History of lung cancer 2008  S/P RIGHT UPPER LOPECTOMY     STAGE I  NON-SMALL CELL LUNG CANCER--  ONCOLOGIST- DR MOHAMED-   NO RECURRENCE  . Hypertension   . Unspecified vitamin D deficiency   . Cancer ([place])     bladder,, lung  . Allergy     seasonal  . Irregular heart rate     per pt heart will skip a beat    Immunization History  Administered Date(s) Administered  . Influenza, High Dose Seasonal PF 11/03/2014  . Influenza-Unspecified 11/06/2012  . Pneumococcal Conjugate-13 01/20/2014  . Pneumococcal-Unspecified 02/06/2002, 09/23/2012  . Tdap 03/09/2010  . Zoster 08/03/2014   Past Surgical History  Procedure Laterality Date  . Lung removal, partial  2008  . Lithotripsy  2012  . Fiberoptic bronchoscopy  10-28-2008  . RULobectomy - NON-SMALL CELL CA  09-21-2006  DR Arlyce Dice        Procedure: TRANSURETHRAL RESECTION OF PROSTATE ;: Franchot Gallo, MD; -  12/25/2011        Procedure: TRANSURETHRAL RESECTION OF BLADDER TUMOR (TURBT);  Surgeon: Franchot Gallo, MD -  12/25/2011       . Colonoscopy  -  HPP and tics Deatra Ina   05-09-2004   FHx:    Reviewed / unchanged  SHx:    Reviewed / unchanged  Systems Review:  Constitutional: Denies fever, chills, wt changes, headaches, insomnia, fatigue, night sweats, change in appetite. Eyes: Denies redness, blurred vision, diplopia, discharge, itchy, watery eyes.  ENT: Denies discharge, congestion, post nasal drip, epistaxis, sore throat, earache, hearing loss, dental pain, tinnitus, vertigo, sinus pain, snoring.  CV: Denies chest pain, palpitations, irregular heartbeat, syncope, dyspnea, diaphoresis, orthopnea, PND, claudication or edema. Respiratory: denies cough, dyspnea, DOE, pleurisy, hoarseness, laryngitis, wheezing.  Gastrointestinal:  Denies dysphagia, odynophagia, heartburn, reflux, water brash, abdominal pain or cramps, nausea, vomiting, bloating, diarrhea, constipation, hematemesis, melena, hematochezia  or hemorrhoids. Genitourinary: Denies dysuria, frequency, urgency, nocturia, hesitancy, discharge, hematuria or flank pain. Musculoskeletal: Denies arthralgias, myalgias, stiffness, jt. swelling, pain, limping or strain/sprain.  Skin: Denies pruritus, rash, hives, warts, acne, eczema or change in skin lesion(s). Neuro: No weakness, tremor, incoordination, spasms, paresthesia or pain. Psychiatric: Denies confusion, memory loss or sensory loss. Endo: Denies change in weight, skin or hair change.  Heme/Lymph: No excessive bleeding, bruising or enlarged lymph nodes.  Physical Exam  BP 116/66 mmHg  Pulse 76  Temp(Src) 97 F (36.1 C)  Resp 16  Ht 5' 4.5" (1.638 m)  Wt 176 lb (79.833 kg)  BMI 29.75 kg/m2  Appears well nourished and in no distress. Eyes: PERRLA, EOMs, conjunctiva no swelling or erythema. Sinuses: No frontal/maxillary tenderness ENT/Mouth: EAC's clear, TM's nl w/o erythema, bulging. Nares clear w/o erythema, swelling, exudates. Oropharynx clear without erythema or exudates. Oral hygiene is good. Tongue normal, non obstructing. Hearing intact.  Neck: Supple. Thyroid nl. Car 2+/2+ without bruits, nodes or JVD. Chest: Respirations nl with BS clear & equal w/o rales, rhonchi, wheezing or stridor.  Cor: Heart sounds normal w/ regular rate and rhythm without sig. murmurs, gallops, clicks, or rubs. Peripheral pulses normal and equal  without edema.  Abdomen: Soft & bowel sounds normal. Non-tender w/o guarding, rebound, hernias, masses, or organomegaly.  Lymphatics: Unremarkable.  Musculoskeletal: Full ROM all peripheral extremities, joint stability, 5/5 strength, and normal gait.  Skin: Warm, dry without exposed rashes, lesions or ecchymosis apparent.  Neuro: Cranial nerves intact, reflexes equal bilaterally.  Sensory-motor testing grossly intact. Tendon reflexes grossly intact.  Pysch: Alert & oriented x 3.  Insight and judgement nl & appropriate. No ideations.  Assessment and Plan:  1. Essential hypertension  - TSH  2. Hyperlipidemia  - Lipid panel - TSH  3. Prediabetes  - Hemoglobin A1c - Insulin, random  4. Vitamin D deficiency  - VITAMIN D 25 Hydroxy   5. Medication management  - CBC with Differential/Platelet - BASIC METABOLIC PANEL WITH GFR - Hepatic function panel - Magnesium   Recommended regular exercise, BP monitoring, weight control, and discussed med and SE's. Recommended labs to assess and monitor clinical status. Further disposition pending results of labs. Over 30 minutes of exam, counseling, chart review was performed

## 2015-06-05 LAB — VITAMIN D 25 HYDROXY (VIT D DEFICIENCY, FRACTURES): Vit D, 25-Hydroxy: 63 ng/mL (ref 30–100)

## 2015-06-05 LAB — INSULIN, RANDOM: INSULIN: 9.2 u[IU]/mL (ref 2.0–19.6)

## 2015-09-06 ENCOUNTER — Ambulatory Visit (INDEPENDENT_AMBULATORY_CARE_PROVIDER_SITE_OTHER): Payer: PPO | Admitting: Physician Assistant

## 2015-09-06 ENCOUNTER — Encounter: Payer: Self-pay | Admitting: Physician Assistant

## 2015-09-06 VITALS — BP 116/74 | HR 74 | Temp 97.5°F | Resp 16 | Ht 64.5 in | Wt 169.2 lb

## 2015-09-06 DIAGNOSIS — Z79899 Other long term (current) drug therapy: Secondary | ICD-10-CM | POA: Diagnosis not present

## 2015-09-06 DIAGNOSIS — E559 Vitamin D deficiency, unspecified: Secondary | ICD-10-CM | POA: Diagnosis not present

## 2015-09-06 DIAGNOSIS — E782 Mixed hyperlipidemia: Secondary | ICD-10-CM

## 2015-09-06 DIAGNOSIS — R079 Chest pain, unspecified: Secondary | ICD-10-CM

## 2015-09-06 DIAGNOSIS — I10 Essential (primary) hypertension: Secondary | ICD-10-CM

## 2015-09-06 DIAGNOSIS — J449 Chronic obstructive pulmonary disease, unspecified: Secondary | ICD-10-CM

## 2015-09-06 MED ORDER — ALBUTEROL SULFATE HFA 108 (90 BASE) MCG/ACT IN AERS: 1.0000 | INHALATION_SPRAY | Freq: Four times a day (QID) | RESPIRATORY_TRACT | 2 refills | Status: DC | PRN

## 2015-09-06 MED ORDER — AZITHROMYCIN 250 MG PO TABS: ORAL_TABLET | ORAL | 1 refills | Status: AC

## 2015-09-06 MED ORDER — PREDNISONE 20 MG PO TABS: ORAL_TABLET | ORAL | 0 refills | Status: DC

## 2015-09-06 NOTE — Progress Notes (Signed)
Patient ID: Raymond Patrick, male   DOB: 07-23-1935, 79 y.o.   MRN: [id number]  Assessment and Plan:  Hypertension:  -Continue medication,  -monitor blood pressure at home.  -Continue DASH diet.   -Reminder to go to the ER if any CP, SOB, nausea, dizziness, severe HA, changes vision/speech, left arm numbness and tingling, and jaw pain.  Cholesterol: -Continue diet and exercise.  -Check cholesterol.   Pre-diabetes: -Continue diet and exercise.  -Check A1C  Vitamin D Def: -check level -continue medications.   COPD Check xray, refill albuterol Traveling to [place] will give meds to for if he has exacerbation there  Chest pain Atypical chest pain with exertion possible stable angina versus COPD, treat COPD and get EKG/refer to cardio for stress test due to risk factors chol, age, smoking history.    Continue diet and meds as discussed. Further disposition pending results of labs.  HPI 80 y.o. male  presents for 3 month follow up with hypertension, hyperlipidemia, prediabetes and vitamin D.   His blood pressure has been controlled at home, today their BP is BP: 116/74.  He does workout.  He does workout several times per week and is doing a lot of weight lifting and is also doing a lot of crunches.   He denies shortness of breath, dizziness. For past month with over exertion has had left sided chest discomfort and pain tightness between his shoulder blades, "feels like something sitting on his chest" x 1-2 days constant, no radiation, no accompaniments. Going to [place] in Oct.    He is on cholesterol medication and denies myalgias. His cholesterol is at goal. The cholesterol last visit was:   Lab Results  Component Value Date   CHOL 166 06/04/2015   HDL 47 06/04/2015   LDLCALC 101 06/04/2015   TRIG 89 06/04/2015   CHOLHDL 3.5 06/04/2015     He has been working on diet and exercise for prediabetes, and denies foot ulcerations, hyperglycemia, hypoglycemia , increased appetite,  nausea, paresthesia of the feet, polydipsia, polyuria, visual disturbances, vomiting and weight loss. Last A1C in the office was:  Lab Results  Component Value Date   HGBA1C 5.6 06/04/2015    Patient is on Vitamin D supplement.  Lab Results  Component Value Date   VD25OH 63 06/04/2015      Current Medications:  Current Outpatient Prescriptions on File Prior to Visit  Medication Sig Dispense Refill  . albuterol (PROVENTIL HFA;VENTOLIN HFA) 108 (90 BASE) MCG/ACT inhaler Inhale 1-2 puffs into the lungs every 6 (six) hours as needed for wheezing or shortness of breath. 18 g 2  . aspirin 81 MG tablet Take 81 mg by mouth daily.    . cetirizine (ZYRTEC) 10 MG tablet Take 1 tablet (10 mg total) by mouth daily. One tab daily for allergies 30 tablet 3  . Cholecalciferol (VITAMIN D) 2000 UNITS CAPS Take 8,000 Units by mouth daily.     . Multiple Vitamin (MULTIVITAMIN WITH MINERALS) TABS Take 1 tablet by mouth every evening.     . Omega-3 Fatty Acids (FISH OIL) 1000 MG CAPS Take 1 capsule by mouth every evening.     Marland Kitchen OVER THE COUNTER MEDICATION Reported on 01/26/2015    . pravastatin (PRAVACHOL) 40 MG tablet Take 1 tablet (40 mg total) by mouth at bedtime. 90 tablet 1  . terazosin (HYTRIN) 10 MG capsule Take 1 capsule (10 mg total) by mouth daily. 90 capsule 1  . vitamin C (ASCORBIC ACID) 500 MG tablet Take  500 mg by mouth daily.     No current facility-administered medications on file prior to visit.     Medical History:  Past Medical History:  Diagnosis Date  . Allergy    seasonal  . Bladder tumor   . BPH (benign prostatic hypertrophy)   . Cancer ([place])    bladder,, lung  . COPD (chronic obstructive pulmonary disease) ([place])   . History of kidney stones   . History of lung cancer 2008  S/P RIGHT UPPER LOPECTOMY    STAGE I  NON-SMALL CELL LUNG CANCER--  ONCOLOGIST- DR MOHAMED-   NO RECURRENCE  . Hyperlipidemia   . Hypertension   . Impaired hearing bilateral aids  . Irregular heart  rate    per pt heart will skip a beat   . Unspecified vitamin D deficiency     Allergies:  Allergies  Allergen Reactions  . Nitrofuran Derivatives Shortness Of Breath    macrobid     Review of Systems:  Review of Systems  Constitutional: Negative for chills, fever and malaise/fatigue.  HENT: Negative for congestion, ear pain and sore throat.   Eyes: Negative for blurred vision and discharge.  Respiratory: Negative for cough, shortness of breath and wheezing.   Cardiovascular: Positive for chest pain. Negative for palpitations and leg swelling.  Gastrointestinal: Positive for constipation. Negative for blood in stool, diarrhea, heartburn, melena, nausea and vomiting.  Genitourinary: Negative.   Skin: Negative.   Neurological: Negative for dizziness, sensory change and headaches.  Psychiatric/Behavioral: Negative for depression. The patient is not nervous/anxious and does not have insomnia.     Family history- Review and unchanged  Social history- Review and unchanged  Physical Exam: BP 116/74   Pulse 74   Temp 97.5 F (36.4 C) (Temporal)   Resp 16   Ht 5' 4.5" (1.638 m)   Wt 169 lb 3.2 oz (76.7 kg)   SpO2 95%   BMI 28.59 kg/m  Wt Readings from Last 3 Encounters:  09/06/15 169 lb 3.2 oz (76.7 kg)  06/04/15 176 lb (79.8 kg)  02/10/15 174 lb (78.9 kg)    General Appearance: Well nourished well developed, in no apparent distress. Eyes: PERRLA, EOMs, conjunctiva no swelling or erythema ENT/Mouth: Ear canals normal without obstruction, swelling, erythma, discharge.  TMs normal bilaterally.  Oropharynx moist, clear, without exudate, or postoropharyngeal swelling. Neck: Supple, thyroid normal,no cervical adenopathy  Respiratory: Respiratory effort normal, Breath sounds clear A&P with mild wheezing without rhonchi, or rale.  No retractions, no accessory usage. Non tender chest wall.  Cardio: RRR with no MRGs. Brisk peripheral pulses without edema.  Abdomen: Soft, + BS,  Non  tender, no guarding, rebound, hernias, masses. Musculoskeletal: Full ROM, 5/5 strength, Normal gait Skin: Warm, dry without rashes, lesions, ecchymosis.  Neuro: Awake and oriented X 3, Cranial nerves intact. Normal muscle tone, no cerebellar symptoms. Psych: Normal affect, Insight and Judgment appropriate.    Vicie Mutters, PA-C 2:55 PM [facility]

## 2015-09-07 LAB — CBC WITH DIFFERENTIAL/PLATELET
BASOS PCT: 1 %
Basophils Absolute: 66 cells/uL (ref 0–200)
EOS ABS: 132 {cells}/uL (ref 15–500)
Eosinophils Relative: 2 %
HEMATOCRIT: 38 % — AB (ref 38.5–50.0)
Hemoglobin: 12.9 g/dL — ABNORMAL LOW (ref 13.2–17.1)
Lymphocytes Relative: 41 %
Lymphs Abs: 2706 cells/uL (ref 850–3900)
MCH: 31.5 pg (ref 27.0–33.0)
MCHC: 33.9 g/dL (ref 32.0–36.0)
MCV: 92.7 fL (ref 80.0–100.0)
MONO ABS: 462 {cells}/uL (ref 200–950)
MPV: 10.2 fL (ref 7.5–12.5)
Monocytes Relative: 7 %
NEUTROS ABS: 3234 {cells}/uL (ref 1500–7800)
Neutrophils Relative %: 49 %
PLATELETS: 190 10*3/uL (ref 140–400)
RBC: 4.1 MIL/uL — AB (ref 4.20–5.80)
RDW: 13.7 % (ref 11.0–15.0)
WBC: 6.6 10*3/uL (ref 3.8–10.8)

## 2015-09-07 LAB — BASIC METABOLIC PANEL WITH GFR
BUN: 10 mg/dL (ref 7–25)
CHLORIDE: 107 mmol/L (ref 98–110)
CO2: 24 mmol/L (ref 20–31)
Calcium: 9.2 mg/dL (ref 8.6–10.3)
Creat: 0.85 mg/dL (ref 0.70–1.18)
GFR, EST NON AFRICAN AMERICAN: 84 mL/min (ref >=60)
GFR, Est African American: >89 mL/min (ref >=60)
Glucose, Bld: 87 mg/dL (ref 65–99)
POTASSIUM: 4 mmol/L (ref 3.5–5.3)
Sodium: 142 mmol/L (ref 135–146)

## 2015-09-07 LAB — HEPATIC FUNCTION PANEL
ALBUMIN: 4 g/dL (ref 3.6–5.1)
ALK PHOS: 64 U/L (ref 40–115)
ALT: 15 U/L (ref 9–46)
AST: 22 U/L (ref 10–35)
BILIRUBIN INDIRECT: 0.5 mg/dL (ref 0.2–1.2)
BILIRUBIN TOTAL: 0.7 mg/dL (ref 0.2–1.2)
Bilirubin, Direct: 0.2 mg/dL (ref <=0.2)
Total Protein: 5.9 g/dL — ABNORMAL LOW (ref 6.1–8.1)

## 2015-09-07 LAB — LIPID PANEL
CHOL/HDL RATIO: 3 ratio (ref <=5.0)
Cholesterol: 127 mg/dL (ref 125–200)
HDL: 42 mg/dL (ref >=40)
LDL CALC: 70 mg/dL (ref <130)
TRIGLYCERIDES: 77 mg/dL (ref <150)
VLDL: 15 mg/dL (ref <30)

## 2015-09-07 LAB — TSH: TSH: 0.8 mIU/L (ref 0.40–4.50)

## 2015-09-18 ENCOUNTER — Other Ambulatory Visit: Payer: Self-pay | Admitting: Internal Medicine

## 2015-09-29 ENCOUNTER — Ambulatory Visit (INDEPENDENT_AMBULATORY_CARE_PROVIDER_SITE_OTHER): Payer: PPO | Admitting: Cardiovascular Disease

## 2015-09-29 ENCOUNTER — Encounter: Payer: Self-pay | Admitting: Cardiovascular Disease

## 2015-09-29 DIAGNOSIS — R079 Chest pain, unspecified: Secondary | ICD-10-CM

## 2015-09-29 DIAGNOSIS — I10 Essential (primary) hypertension: Secondary | ICD-10-CM | POA: Diagnosis not present

## 2015-09-29 DIAGNOSIS — E782 Mixed hyperlipidemia: Secondary | ICD-10-CM | POA: Diagnosis not present

## 2015-09-29 NOTE — Patient Instructions (Signed)

## 2015-09-29 NOTE — Assessment & Plan Note (Signed)
History of hypertension with blood pressure measured today at 98/62. He is not on antihypertensive medications

## 2015-09-29 NOTE — Assessment & Plan Note (Signed)
History of hyperlipidemia on statin therapy with recent lipid profile performed 09/06/15 revealed total cholesterol 127, LDL 70 and HDL of 42

## 2015-09-29 NOTE — Assessment & Plan Note (Signed)
History of atypical chest pain approximately a month ago which has not recurred. The pain did not sound anginal. If he has recurrent chest pain we will obtain a functional study.

## 2015-09-29 NOTE — Progress Notes (Signed)
09/29/2015 Raymond Patrick   1935/06/05  [id number]  Primary Physician MCKEOWN,WILLIAM DAVID, MD Primary Cardiologist: Lorretta Harp MD [name]  HPI:  Raymond Patrick is a delightful 80 year old mildly overweight single Caucasian male with no children referred by Dr. Melford Aase for cardiovascular evaluation. I last saw him in the office 06/18/13.  He lives alone. He is retired. 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 His cardiovascular risk factor profile from October 4 50 pack years of tobacco abuse having quit 10 years ago. He has treated hypertension and hyperlipidemia. There is no family history of heart disease. He is never have a heart attack or stroke. Does get mild dyspnea. His past medical history otherwise is remarkable for having undergone a VATS procedure by Dr. Baldemar Friday for what turned out to be malignancy. Dr. Earlie Server is his oncologist. He has had no recurrence. Since I saw him last he is remaining stable up until a month ago when he noticed some left precordial chest pain off and on for about a day and a half which spontaneously resolved and has not recurred.    Current Outpatient Prescriptions  Medication Sig Dispense Refill  . albuterol (PROVENTIL HFA;VENTOLIN HFA) 108 (90 Base) MCG/ACT inhaler Inhale 1-2 puffs into the lungs every 6 (six) hours as needed for wheezing or shortness of breath. 18 g 2  . aspirin 81 MG tablet Take 81 mg by mouth daily.    . cetirizine (ZYRTEC) 10 MG tablet Take 1 tablet (10 mg total) by mouth daily. One tab daily for allergies 30 tablet 3  . Cholecalciferol (VITAMIN D) 2000 UNITS CAPS Take 8,000 Units by mouth daily.     . Multiple Vitamin (MULTIVITAMIN WITH MINERALS) TABS Take 1 tablet by mouth every evening.     . Omega-3 Fatty Acids (FISH OIL) 1000 MG CAPS Take 1 capsule by mouth every evening.     Marland Kitchen OVER THE COUNTER MEDICATION Reported on 01/26/2015    . pravastatin (PRAVACHOL) 40 MG tablet Take 1  tablet (40 mg total) by mouth at bedtime. 90 tablet 1  . predniSONE (DELTASONE) 20 MG tablet 2 tablets daily for 3 days, 1 tablet daily for 4 days. 10 tablet 0  . terazosin (HYTRIN) 10 MG capsule TAKE 1 CAPSULE(10 MG) BY MOUTH DAILY 90 capsule 1  . vitamin C (ASCORBIC ACID) 500 MG tablet Take 500 mg by mouth daily.     No current facility-administered medications for this visit.     Allergies  Allergen Reactions  . Nitrofuran Derivatives Shortness Of Breath    macrobid    Social History   Social History  . Marital status: Single    Spouse name: N/A  . Number of children: N/A  . Years of education: N/A   Occupational History  . Not on file.   Social History Main Topics  . Smoking status: Former Smoker    Packs/day: 1.00    Years: 35.00    Types: Cigarettes    Quit date: 02/07/2004  . Smokeless tobacco: Never Used  . Alcohol use 0.0 oz/week     Comment: socially  . Drug use: No  . Sexual activity: Not on file   Other Topics Concern  . Not on file   Social History Narrative  . No narrative on file     Review of Systems: General: negative for chills, fever, night sweats or weight changes.  Cardiovascular: negative for chest pain, dyspnea on  exertion, edema, orthopnea, palpitations, paroxysmal nocturnal dyspnea or shortness of breath Dermatological: negative for rash Respiratory: negative for cough or wheezing Urologic: negative for hematuria Abdominal: negative for nausea, vomiting, diarrhea, bright red blood per rectum, melena, or hematemesis Neurologic: negative for visual changes, syncope, or dizziness All other systems reviewed and are otherwise negative except as noted above.    Blood pressure 98/62, pulse 71, height '5\' 4"'$  (1.626 m), weight 165 lb (74.8 kg).  General appearance: alert and no distress Neck: no adenopathy, no carotid bruit, no JVD, supple, symmetrical, trachea midline and thyroid not enlarged, symmetric, no tenderness/mass/nodules Lungs: clear  to auscultation bilaterally Heart: regular rate and rhythm, S1, S2 normal, no murmur, click, rub or gallop Extremities: extremities normal, atraumatic, no cyanosis or edema  EKG sinus rhythm at 71 without ST or T-wave changes. I Pursley reviewed this EKG  ASSESSMENT AND PLAN:   Hyperlipidemia History of hyperlipidemia on statin therapy with recent lipid profile performed 09/06/15 revealed total cholesterol 127, LDL 70 and HDL of 42  Hypertension History of hypertension with blood pressure measured today at 98/62. He is not on antihypertensive medications  Chest pain History of atypical chest pain approximately a month ago which has not recurred. The pain did not sound anginal. If he has recurrent chest pain we will obtain a functional study.      Lorretta Harp MD FACP,FACC,FAHA, [facility] 09/29/2015 12:01 PM

## 2015-10-20 DIAGNOSIS — C670 Malignant neoplasm of trigone of bladder: Secondary | ICD-10-CM | POA: Diagnosis not present

## 2015-10-22 ENCOUNTER — Other Ambulatory Visit (HOSPITAL_BASED_OUTPATIENT_CLINIC_OR_DEPARTMENT_OTHER): Payer: PPO

## 2015-10-22 ENCOUNTER — Ambulatory Visit (HOSPITAL_COMMUNITY)
Admission: RE | Admit: 2015-10-22 | Discharge: 2015-10-22 | Disposition: A | Discharge status: Home / Self Care | Payer: PPO | Source: Ambulatory Visit | Attending: Internal Medicine | Admitting: Internal Medicine

## 2015-10-22 DIAGNOSIS — Z902 Acquired absence of lung [part of]: Secondary | ICD-10-CM | POA: Diagnosis not present

## 2015-10-22 DIAGNOSIS — I10 Essential (primary) hypertension: Secondary | ICD-10-CM

## 2015-10-22 DIAGNOSIS — Z8546 Personal history of malignant neoplasm of prostate: Secondary | ICD-10-CM | POA: Diagnosis not present

## 2015-10-22 DIAGNOSIS — C3491 Malignant neoplasm of unspecified part of right bronchus or lung: Secondary | ICD-10-CM | POA: Diagnosis not present

## 2015-10-22 DIAGNOSIS — Z85118 Personal history of other malignant neoplasm of bronchus and lung: Secondary | ICD-10-CM | POA: Insufficient documentation

## 2015-10-22 DIAGNOSIS — Z8551 Personal history of malignant neoplasm of bladder: Secondary | ICD-10-CM

## 2015-10-22 LAB — COMPREHENSIVE METABOLIC PANEL
ALT: 18 U/L (ref 0–55)
AST: 21 U/L (ref 5–34)
Albumin: 3.7 g/dL (ref 3.5–5.0)
Alkaline Phosphatase: 90 U/L (ref 40–150)
Anion Gap: 8 mEq/L (ref 3–11)
BUN: 11.6 mg/dL (ref 7.0–26.0)
CALCIUM: 9.2 mg/dL (ref 8.4–10.4)
CHLORIDE: 110 meq/L — AB (ref 98–109)
CO2: 25 mEq/L (ref 22–29)
Creatinine: 0.9 mg/dL (ref 0.7–1.3)
EGFR: 82 mL/min/{1.73_m2} — AB (ref >90)
Glucose: 96 mg/dl (ref 70–140)
POTASSIUM: 4.4 meq/L (ref 3.5–5.1)
SODIUM: 143 meq/L (ref 136–145)
Total Bilirubin: 0.57 mg/dL (ref 0.20–1.20)
Total Protein: 6.6 g/dL (ref 6.4–8.3)

## 2015-10-22 LAB — CBC WITH DIFFERENTIAL/PLATELET
BASO%: 1.1 % (ref 0.0–2.0)
Basophils Absolute: 0.1 10*3/uL (ref 0.0–0.1)
EOS%: 1.8 % (ref 0.0–7.0)
Eosinophils Absolute: 0.1 10*3/uL (ref 0.0–0.5)
HEMATOCRIT: 41.6 % (ref 38.4–49.9)
HGB: 13.9 g/dL (ref 13.0–17.1)
LYMPH%: 44.4 % (ref 14.0–49.0)
MCH: 31.1 pg (ref 27.2–33.4)
MCHC: 33.4 g/dL (ref 32.0–36.0)
MCV: 93.1 fL (ref 79.3–98.0)
MONO#: 0.4 10*3/uL (ref 0.1–0.9)
MONO%: 7.6 % (ref 0.0–14.0)
NEUT#: 2.6 10*3/uL (ref 1.5–6.5)
NEUT%: 45.1 % (ref 39.0–75.0)
Platelets: 163 10*3/uL (ref 140–400)
RBC: 4.47 10*6/uL (ref 4.20–5.82)
RDW: 13.3 % (ref 11.0–14.6)
WBC: 5.8 10*3/uL (ref 4.0–10.3)
lymph#: 2.6 10*3/uL (ref 0.9–3.3)

## 2015-10-25 ENCOUNTER — Other Ambulatory Visit: Payer: PRIVATE HEALTH INSURANCE

## 2015-10-26 ENCOUNTER — Encounter: Payer: Self-pay | Admitting: Internal Medicine

## 2015-10-26 ENCOUNTER — Ambulatory Visit (HOSPITAL_BASED_OUTPATIENT_CLINIC_OR_DEPARTMENT_OTHER): Payer: PPO | Admitting: Internal Medicine

## 2015-10-26 VITALS — BP 115/47 | HR 64 | Temp 97.6°F | Resp 18 | Ht 64.0 in | Wt 166.2 lb

## 2015-10-26 DIAGNOSIS — Z85118 Personal history of other malignant neoplasm of bronchus and lung: Secondary | ICD-10-CM

## 2015-10-26 NOTE — Progress Notes (Signed)
[facility] [phone]   Fax:([phone]) [place], MD [place] [address]  DIAGNOSIS: Stage IA (T1a., N0, MX) non-small cell lung cancer, adenocarcinoma diagnosed in July 2008.   PRIOR THERAPY: Status post wedge resection of the right upper lobe with lymph node biopsy under the care of Dr. Arlyce Dice on 09/21/2006   CURRENT THERAPY: Observation.  INTERVAL HISTORY: Raymond Patrick 80 y.o. male returns to the clinic today for annual followup visit. The patient has been observation for the last 9 years. He is feeling fine today with no specific complaints. He denied having any significant chest pain, shortness of breath, cough or hemoptysis. He denied having any weight loss or night sweats. He has repeat CBC and comprehensive metabolic panel as well as chest x-ray performed recently and he is here for evaluation and discussion of his lab and imaging results.  MEDICAL HISTORY: Past Medical History:  Diagnosis Date  . Allergy    seasonal  . Bladder tumor   . BPH (benign prostatic hypertrophy)   . Cancer ([place])    bladder,, lung  . COPD (chronic obstructive pulmonary disease) ([place])   . History of kidney stones   . History of lung cancer 2008  S/P RIGHT UPPER LOPECTOMY    STAGE I  NON-SMALL CELL LUNG CANCER--  ONCOLOGIST- DR Raisha Brabender-   NO RECURRENCE  . Hyperlipidemia   . Hypertension   . Impaired hearing bilateral aids  . Irregular heart rate    per pt heart will skip a beat   . Unspecified vitamin D deficiency     ALLERGIES:  is allergic to nitrofuran derivatives.  MEDICATIONS:  Current Outpatient Prescriptions  Medication Sig Dispense Refill  . albuterol (PROVENTIL HFA;VENTOLIN HFA) 108 (90 Base) MCG/ACT inhaler Inhale 1-2 puffs into the lungs every 6 (six) hours as needed for wheezing or shortness of breath. 18 g 2  . aspirin 81 MG tablet Take 81 mg by mouth daily.    .  cetirizine (ZYRTEC) 10 MG tablet Take 1 tablet (10 mg total) by mouth daily. One tab daily for allergies 30 tablet 3  . Cholecalciferol (VITAMIN D) 2000 UNITS CAPS Take 8,000 Units by mouth daily.     . Multiple Vitamin (MULTIVITAMIN WITH MINERALS) TABS Take 1 tablet by mouth every evening.     . Omega-3 Fatty Acids (FISH OIL) 1000 MG CAPS Take 1 capsule by mouth every evening.     Marland Kitchen OVER THE COUNTER MEDICATION Reported on 01/26/2015    . pravastatin (PRAVACHOL) 40 MG tablet Take 1 tablet (40 mg total) by mouth at bedtime. 90 tablet 1  . predniSONE (DELTASONE) 20 MG tablet 2 tablets daily for 3 days, 1 tablet daily for 4 days. 10 tablet 0  . terazosin (HYTRIN) 10 MG capsule TAKE 1 CAPSULE(10 MG) BY MOUTH DAILY 90 capsule 1  . vitamin C (ASCORBIC ACID) 500 MG tablet Take 500 mg by mouth daily.     No current facility-administered medications for this visit.     SURGICAL HISTORY:  Past Surgical History:  Procedure Laterality Date  . COLONOSCOPY  05-09-2004   HPP and tics [facility]   . FIBEROPTIC BRONCHOSCOPY  10-28-2008  . LITHOTRIPSY  2012  . LUNG REMOVAL, PARTIAL  2008  . PROSTATE SURGERY     prostatectomy  . RIGHT UPPER LUNG LOBECTOMY  09-21-2006  DR BURNEY   NON-SMALL CELL CANCER/ SEVERE COPD  .  TRANSURETHRAL RESECTION OF BLADDER TUMOR  12/25/2011   Procedure: TRANSURETHRAL RESECTION OF BLADDER TUMOR (TURBT);  Surgeon: Franchot Gallo, MD;  [facility];  Service: Urology;  Laterality: N/A;  . TRANSURETHRAL RESECTION OF PROSTATE  12/25/2011   Procedure: TRANSURETHRAL RESECTION OF THE PROSTATE WITH GYRUS INSTRUMENTS;  Surgeon: Franchot Gallo, MD;  Location: [facility];  Service: Urology;  Laterality: N/A;  2 HRS     REVIEW OF SYSTEMS:  A comprehensive review of systems was negative.   PHYSICAL EXAMINATION: General appearance: alert, cooperative and no distress Head: Normocephalic, without obvious abnormality, atraumatic Neck: no  adenopathy Lymph nodes: Cervical, supraclavicular, and axillary nodes normal. Resp: clear to auscultation bilaterally Cardio: regular rate and rhythm, S1, S2 normal, no murmur, click, rub or gallop GI: soft, non-tender; bowel sounds normal; no masses,  no organomegaly Extremities: extremities normal, atraumatic, no cyanosis or edema  ECOG PERFORMANCE STATUS: 1 - Symptomatic but completely ambulatory  Blood pressure (!) 115/47, pulse 64, temperature 97.6 F (36.4 C), temperature source Oral, resp. rate 18, height '5\' 4"'$  (1.626 m), weight 166 lb 3.2 oz (75.4 kg), SpO2 98 %.  LABORATORY DATA: Lab Results  Component Value Date   WBC 5.8 10/22/2015   HGB 13.9 10/22/2015   HCT 41.6 10/22/2015   MCV 93.1 10/22/2015   PLT 163 10/22/2015      Chemistry      Component Value Date/Time   NA 143 10/22/2015 0900   K 4.4 10/22/2015 0900   CL 107 09/06/2015 1512   CL 108 (H) 10/16/2011 0828   CO2 25 10/22/2015 0900   BUN 11.6 10/22/2015 0900   CREATININE 0.9 10/22/2015 0900      Component Value Date/Time   CALCIUM 9.2 10/22/2015 0900   ALKPHOS 90 10/22/2015 0900   AST 21 10/22/2015 0900   ALT 18 10/22/2015 0900   BILITOT 0.57 10/22/2015 0900       RADIOGRAPHIC STUDIES: Dg Chest 1 View  Result Date: 10/22/2015 CLINICAL DATA:  Right lung cancer.  Status post partial lobectomy. EXAM: CHEST 1 VIEW COMPARISON:  10/23/2014 FINDINGS: Postoperative changes in the right upper lobe. Lungs are clear. Heart is normal size. No effusions or acute bony abnormality. Mild rightward scoliosis in the thoracic spine. IMPRESSION: No active disease. Electronically Signed   By: Rolm Baptise M.D.   On: 10/22/2015 10:51   ASSESSMENT AND PLAN: this is a very pleasant 80 years old white male with history of stage IA non-small cell lung cancer status post wedge resection of the right upper lobe and has been observation since August of 2008 with no evidence for disease recurrence. The recent chest x-ray showed no  acute cardiopulmonary disease. I discussed the lab and results with the patient today.  I recommended for the patient to continue on observation with routine follow-up visit with his primary care physician at this point. I'll be happy to see in the future if needed. He was advised to call immediately if he has any concerning symptoms in the interval. The patient voices understanding of current disease status and treatment options and is in agreement with the current care plan.  All questions were answered. The patient knows to call the clinic with any problems, questions or concerns. We can certainly see the patient much sooner if necessary.  Disclaimer: This note was dictated with voice recognition software. Similar sounding words can inadvertently be transcribed and may be missed upon review.

## 2015-11-01 ENCOUNTER — Observation Stay (HOSPITAL_COMMUNITY)
Admission: EM | Admit: 2015-11-01 | Discharge: 2015-11-02 | Disposition: A | Discharge status: Home / Self Care | Payer: PPO | Attending: Cardiology | Admitting: Cardiology

## 2015-11-01 ENCOUNTER — Encounter (HOSPITAL_COMMUNITY): Payer: Self-pay | Admitting: *Deleted

## 2015-11-01 ENCOUNTER — Other Ambulatory Visit: Payer: Self-pay

## 2015-11-01 ENCOUNTER — Emergency Department (HOSPITAL_COMMUNITY): Payer: PPO

## 2015-11-01 DIAGNOSIS — Z85118 Personal history of other malignant neoplasm of bronchus and lung: Secondary | ICD-10-CM | POA: Insufficient documentation

## 2015-11-01 DIAGNOSIS — E785 Hyperlipidemia, unspecified: Secondary | ICD-10-CM | POA: Diagnosis not present

## 2015-11-01 DIAGNOSIS — R079 Chest pain, unspecified: Secondary | ICD-10-CM

## 2015-11-01 DIAGNOSIS — Z87891 Personal history of nicotine dependence: Secondary | ICD-10-CM | POA: Insufficient documentation

## 2015-11-01 DIAGNOSIS — Z902 Acquired absence of lung [part of]: Secondary | ICD-10-CM | POA: Insufficient documentation

## 2015-11-01 DIAGNOSIS — N400 Enlarged prostate without lower urinary tract symptoms: Secondary | ICD-10-CM | POA: Insufficient documentation

## 2015-11-01 DIAGNOSIS — R7303 Prediabetes: Secondary | ICD-10-CM | POA: Insufficient documentation

## 2015-11-01 DIAGNOSIS — J449 Chronic obstructive pulmonary disease, unspecified: Secondary | ICD-10-CM | POA: Insufficient documentation

## 2015-11-01 DIAGNOSIS — Z7982 Long term (current) use of aspirin: Secondary | ICD-10-CM | POA: Diagnosis not present

## 2015-11-01 DIAGNOSIS — I249 Acute ischemic heart disease, unspecified: Secondary | ICD-10-CM

## 2015-11-01 DIAGNOSIS — R0789 Other chest pain: Secondary | ICD-10-CM | POA: Diagnosis not present

## 2015-11-01 DIAGNOSIS — I200 Unstable angina: Secondary | ICD-10-CM | POA: Diagnosis not present

## 2015-11-01 DIAGNOSIS — E782 Mixed hyperlipidemia: Secondary | ICD-10-CM | POA: Diagnosis present

## 2015-11-01 DIAGNOSIS — I10 Essential (primary) hypertension: Secondary | ICD-10-CM | POA: Diagnosis not present

## 2015-11-01 LAB — I-STAT TROPONIN, ED: Troponin i, poc: 0 ng/mL (ref 0.00–0.08)

## 2015-11-01 LAB — PROTIME-INR
INR: 1.14
Prothrombin Time: 14.6 seconds (ref 11.4–15.2)

## 2015-11-01 LAB — HEPARIN LEVEL (UNFRACTIONATED): Heparin Unfractionated: 0.61 IU/mL (ref 0.30–0.70)

## 2015-11-01 LAB — CBC WITH DIFFERENTIAL/PLATELET
BASOS ABS: 0 10*3/uL (ref 0.0–0.1)
BASOS PCT: 1 %
Eosinophils Absolute: 0.1 10*3/uL (ref 0.0–0.7)
Eosinophils Relative: 2 %
HEMATOCRIT: 41.5 % (ref 39.0–52.0)
HEMOGLOBIN: 13.6 g/dL (ref 13.0–17.0)
Lymphocytes Relative: 40 %
Lymphs Abs: 2.4 10*3/uL (ref 0.7–4.0)
MCH: 31.3 pg (ref 26.0–34.0)
MCHC: 32.8 g/dL (ref 30.0–36.0)
MCV: 95.4 fL (ref 78.0–100.0)
Monocytes Absolute: 0.4 10*3/uL (ref 0.1–1.0)
Monocytes Relative: 7 %
NEUTROS ABS: 3.1 10*3/uL (ref 1.7–7.7)
NEUTROS PCT: 50 %
Platelets: 150 10*3/uL (ref 150–400)
RBC: 4.35 MIL/uL (ref 4.22–5.81)
RDW: 12.9 % (ref 11.5–15.5)
WBC: 6 10*3/uL (ref 4.0–10.5)

## 2015-11-01 LAB — BASIC METABOLIC PANEL
ANION GAP: 6 (ref 5–15)
BUN: 9 mg/dL (ref 6–20)
CALCIUM: 9.4 mg/dL (ref 8.9–10.3)
CHLORIDE: 108 mmol/L (ref 101–111)
CO2: 29 mmol/L (ref 22–32)
Creatinine, Ser: 0.92 mg/dL (ref 0.61–1.24)
GFR calc non Af Amer: >60 mL/min (ref >60)
Glucose, Bld: 112 mg/dL — ABNORMAL HIGH (ref 65–99)
Potassium: 3.5 mmol/L (ref 3.5–5.1)
Sodium: 143 mmol/L (ref 135–145)

## 2015-11-01 MED ORDER — ASPIRIN EC 81 MG PO TBEC
81.0000 mg | DELAYED_RELEASE_TABLET | Freq: Every day | ORAL | Status: DC
  Administered 2015-11-02: 81 mg via ORAL
  Filled 2015-11-01: qty 1

## 2015-11-01 MED ORDER — TERAZOSIN HCL 5 MG PO CAPS: 10.0000 mg | ORAL_CAPSULE | Freq: Once | ORAL | Status: DC

## 2015-11-01 MED ORDER — ACETAMINOPHEN 325 MG PO TABS: 650.0000 mg | ORAL_TABLET | ORAL | Status: DC | PRN

## 2015-11-01 MED ORDER — ALBUTEROL SULFATE (2.5 MG/3ML) 0.083% IN NEBU: 3.0000 mL | INHALATION_SOLUTION | Freq: Four times a day (QID) | RESPIRATORY_TRACT | Status: DC | PRN

## 2015-11-01 MED ORDER — PRAVASTATIN SODIUM 40 MG PO TABS
40.0000 mg | ORAL_TABLET | Freq: Every day | ORAL | Status: DC
  Administered 2015-11-01: 40 mg via ORAL
  Filled 2015-11-01: qty 1

## 2015-11-01 MED ORDER — NITROGLYCERIN 0.4 MG SL SUBL: 0.4000 mg | SUBLINGUAL_TABLET | SUBLINGUAL | Status: DC | PRN

## 2015-11-01 MED ORDER — ASPIRIN 81 MG PO CHEW: 324.0000 mg | CHEWABLE_TABLET | ORAL | Status: DC

## 2015-11-01 MED ORDER — HEPARIN BOLUS VIA INFUSION
4000.0000 [IU] | Freq: Once | INTRAVENOUS | Status: AC
  Administered 2015-11-01: 4000 [IU] via INTRAVENOUS
  Filled 2015-11-01: qty 4000

## 2015-11-01 MED ORDER — NITROGLYCERIN 2 % TD OINT
1.0000 [in_us] | TOPICAL_OINTMENT | Freq: Once | TRANSDERMAL | Status: AC
  Administered 2015-11-01: 1 [in_us] via TOPICAL
  Filled 2015-11-01: qty 1

## 2015-11-01 MED ORDER — SODIUM CHLORIDE 0.9% FLUSH: 3.0000 mL | INTRAVENOUS | Status: DC | PRN

## 2015-11-01 MED ORDER — ASPIRIN 81 MG PO TABS: 81.0000 mg | ORAL_TABLET | Freq: Every day | ORAL | Status: DC

## 2015-11-01 MED ORDER — SODIUM CHLORIDE 0.9% FLUSH
3.0000 mL | Freq: Two times a day (BID) | INTRAVENOUS | Status: DC
  Administered 2015-11-01: 3 mL via INTRAVENOUS

## 2015-11-01 MED ORDER — OMEGA-3-ACID ETHYL ESTERS 1 G PO CAPS
2.0000 g | ORAL_CAPSULE | Freq: Two times a day (BID) | ORAL | Status: DC
  Administered 2015-11-01 – 2015-11-02 (×2): 2 g via ORAL
  Filled 2015-11-01 (×2): qty 2

## 2015-11-01 MED ORDER — HEPARIN (PORCINE) IN NACL 100-0.45 UNIT/ML-% IJ SOLN
900.0000 [IU]/h | INTRAMUSCULAR | Status: DC
  Administered 2015-11-01: 900 [IU]/h via INTRAVENOUS
  Filled 2015-11-01 (×2): qty 250

## 2015-11-01 MED ORDER — SODIUM CHLORIDE 0.9 % IV SOLN: 250.0000 mL | INTRAVENOUS | Status: DC | PRN

## 2015-11-01 MED ORDER — ASPIRIN 81 MG PO CHEW
324.0000 mg | CHEWABLE_TABLET | Freq: Once | ORAL | Status: AC
  Administered 2015-11-01: 324 mg via ORAL
  Filled 2015-11-01: qty 4

## 2015-11-01 MED ORDER — ASPIRIN 300 MG RE SUPP: 300.0000 mg | RECTAL | Status: DC

## 2015-11-01 MED ORDER — TERAZOSIN HCL 5 MG PO CAPS
10.0000 mg | ORAL_CAPSULE | Freq: Every day | ORAL | Status: DC
  Administered 2015-11-01: 10 mg via ORAL
  Filled 2015-11-01: qty 2

## 2015-11-01 MED ORDER — ONDANSETRON HCL 4 MG/2ML IJ SOLN: 4.0000 mg | Freq: Four times a day (QID) | INTRAMUSCULAR | Status: DC | PRN

## 2015-11-01 NOTE — ED Provider Notes (Signed)
[facility] DEPT Provider Note   CSN: [id number] Arrival date & time: 11/01/15  9528     History   Chief Complaint Chief Complaint  Patient presents with  . Chest Pain  . Arm Pain    HPI Raymond Patrick is a 80 y.o. male.  HPI  This is a 80 year old male with a history of COPD, lung cancer, hypertension, hyperlipidemia who presents with chest pain. Reports that chest pain woke him up from sleep at 5 AM. Pressure and radiating into the left arm. He thought he had indigestion and took baking soda which "may be helped." He reports continued 4 out of 10 pain with associated shortness of breath.  Denies any recent illnesses or fevers. Denies any history of coronary artery disease. Is active daily.  Past Medical History:  Diagnosis Date  . Allergy    seasonal  . Bladder tumor   . BPH (benign prostatic hypertrophy)   . Cancer ([place])    bladder,, lung  . COPD (chronic obstructive pulmonary disease) ([place])   . History of kidney stones   . History of lung cancer 2008  S/P RIGHT UPPER LOPECTOMY    STAGE I  NON-SMALL CELL LUNG CANCER--  ONCOLOGIST- DR MOHAMED-   NO RECURRENCE  . Hyperlipidemia   . Hypertension   . Impaired hearing bilateral aids  . Irregular heart rate    per pt heart will skip a beat   . Unspecified vitamin D deficiency     Patient Active Problem List   Diagnosis Date Noted  . Chest pain 09/29/2015  . BMI 29.0-29.9,adult 11/03/2014  . Medicare annual wellness visit, subsequent 11/03/2014  . History of lung cancer 01/20/2014  . Obesity 01/20/2014  . Medication management 03/25/2013  . Prediabetes 03/25/2013  . Hyperlipidemia   . COPD (chronic obstructive pulmonary disease) ([place])   . Hypertension   . Vitamin D deficiency     Past Surgical History:  Procedure Laterality Date  . COLONOSCOPY  05-09-2004   HPP and tics Kaplan   . FIBEROPTIC BRONCHOSCOPY  10-28-2008  . LITHOTRIPSY  2012  . LUNG REMOVAL, PARTIAL  2008  . PROSTATE SURGERY     prostatectomy  . RIGHT UPPER LUNG LOBECTOMY  09-21-2006  DR BURNEY   NON-SMALL CELL CANCER/ SEVERE COPD  . TRANSURETHRAL RESECTION OF BLADDER TUMOR  12/25/2011   Procedure: TRANSURETHRAL RESECTION OF BLADDER TUMOR (TURBT);  Surgeon: Franchot Gallo, MD;  Location: [facility];  Service: Urology;  Laterality: N/A;  . TRANSURETHRAL RESECTION OF PROSTATE  12/25/2011   Procedure: TRANSURETHRAL RESECTION OF THE PROSTATE WITH GYRUS INSTRUMENTS;  Surgeon: Franchot Gallo, MD;  Location: [facility];  Service: Urology;  Laterality: N/A;  2 HRS        Home Medications    Prior to Admission medications   Medication Sig Start Date End Date Taking? Authorizing Provider  albuterol (PROVENTIL HFA;VENTOLIN HFA) 108 (90 Base) MCG/ACT inhaler Inhale 1-2 puffs into the lungs every 6 (six) hours as needed for wheezing or shortness of breath. 09/06/15   Vicie Mutters, PA-C  aspirin 81 MG tablet Take 81 mg by mouth daily.    Historical Provider, MD  cetirizine (ZYRTEC) 10 MG tablet Take 1 tablet (10 mg total) by mouth daily. One tab daily for allergies 02/10/15   Starlyn Skeans, PA-C  Cholecalciferol (VITAMIN D) 2000 UNITS CAPS Take 8,000 Units by mouth daily.     Historical Provider, MD  Multiple Vitamin (MULTIVITAMIN WITH MINERALS) TABS Take 1 tablet  by mouth every evening.     Historical Provider, MD  Omega-3 Fatty Acids (FISH OIL) 1000 MG CAPS Take 1 capsule by mouth every evening.     Historical Provider, MD  OVER THE COUNTER MEDICATION Reported on 01/26/2015    Historical Provider, MD  pravastatin (PRAVACHOL) 40 MG tablet Take 1 tablet (40 mg total) by mouth at bedtime. 03/22/15   [name], MD  predniSONE (DELTASONE) 20 MG tablet 2 tablets daily for 3 days, 1 tablet daily for 4 days. 09/06/15   Vicie Mutters, PA-C  terazosin (HYTRIN) 10 MG capsule TAKE 1 CAPSULE(10 MG) BY MOUTH DAILY 09/18/15   [name], MD  vitamin C (ASCORBIC ACID) 500 MG tablet Take 500  mg by mouth daily.    Historical Provider, MD    Family History Family History  Problem Relation Age of Onset  . Cancer Mother     Non-Hodgkin's Lymphoma  . Diabetes Mother   . Hypertension Mother   . Colon polyps Mother   . Cancer Father     Colon cancer w/ metastasis to lung & bone  . Colon cancer Father   . Cancer Brother 28    Lung cancer  . Diabetes Maternal Grandmother   . Heart failure Maternal Grandmother   . Cancer Brother 65    Non-small cell Lung cancer  . Pneumonia Maternal Grandfather   . Esophageal cancer Neg Hx   . Rectal cancer Neg Hx   . Stomach cancer Neg Hx     Social History Social History  Substance Use Topics  . Smoking status: Former Smoker    Packs/day: 1.00    Years: 35.00    Types: Cigarettes    Quit date: 02/07/2004  . Smokeless tobacco: Never Used  . Alcohol use 0.0 oz/week     Comment: socially     Allergies   Nitrofuran derivatives   Review of Systems Review of Systems  Constitutional: Negative for fever.  Respiratory: Positive for shortness of breath. Negative for cough.   Cardiovascular: Positive for chest pain. Negative for leg swelling.  Gastrointestinal: Negative for abdominal pain, nausea and vomiting.  Genitourinary: Negative for dysuria.  All other systems reviewed and are negative.    Physical Exam Updated Vital Signs BP 140/72   Pulse 67   Temp 97.5 F (36.4 C) (Oral)   Resp 14   Ht '5\' 5"'$  (1.651 m)   Wt 165 lb (74.8 kg)   SpO2 96%   BMI 27.46 kg/m   Physical Exam  Constitutional: He is oriented to person, place, and time. He appears well-developed and well-nourished. No distress.  HENT:  Head: Normocephalic and atraumatic.  Eyes: Pupils are equal, round, and reactive to light.  Cardiovascular: Normal rate, regular rhythm and normal heart sounds.   No murmur heard. Pulmonary/Chest: Effort normal. No respiratory distress. He has wheezes.  Scant expiratory wheeze with equal air movement in all lung fields    Abdominal: Soft. Bowel sounds are normal. There is no tenderness. There is no rebound.  Musculoskeletal: He exhibits no edema.  Neurological: He is alert and oriented to person, place, and time.  Skin: Skin is warm and dry.  Psychiatric: He has a normal mood and affect.  Nursing note and vitals reviewed.    ED Treatments / Results  Labs (all labs ordered are listed, but only abnormal results are displayed) Labs Reviewed  BASIC METABOLIC PANEL - Abnormal; Notable for the following:       Result Value  Glucose, Bld 112 (*)    All other components within normal limits  CBC WITH DIFFERENTIAL/PLATELET  Randolm Idol, ED    EKG  EKG Interpretation  Date/Time:  Monday November 01 2015 06:27:27 EDT Ventricular Rate:  74 PR Interval:  160 QRS Duration: 82 QT Interval:  392 QTC Calculation: 435 R Axis:   24 Text Interpretation:  Sinus rhythm with Fusion complexes and Premature atrial complexes with Abberant conduction Low voltage QRS Borderline ECG T wave flattening anteriorly when compared to prior Confirmed by HORTON  MD, Rogers ([id number]) on 11/01/2015 6:39:35 AM       Radiology Dg Chest 2 View  Result Date: 11/01/2015 CLINICAL DATA:  Awakened this morning with chest pain, no shortness of breath, history of lung malignancy status post partial lung resection in 2008. EXAM: CHEST  2 VIEW COMPARISON:  Portable chest x-ray of October 22, 2015 FINDINGS: The lungs are well-expanded. There is no focal infiltrate nor parenchymal mass. There is no pneumothorax or pleural effusion. The heart and pulmonary vascularity are normal. The mediastinum is normal in width. There is calcification in the wall of the aortic arch. There are is stable moderate dextrocurvature centered in the mid to lower thoracic spine. IMPRESSION: No active cardiopulmonary disease. Aortic atherosclerosis. Electronically Signed   By: David  Martinique M.D.   On: 11/01/2015 07:22    Procedures Procedures (including  critical care time)  Medications Ordered in ED Medications  nitroGLYCERIN (NITROGLYN) 2 % ointment 1 inch (1 inch Topical Given 11/01/15 0747)  aspirin chewable tablet 324 mg (324 mg Oral Given 11/01/15 0747)     Initial Impression / Assessment and Plan / ED Course  I have reviewed the triage vital signs and the nursing notes.  Pertinent labs & imaging results that were available during my care of the patient were reviewed by me and considered in my medical decision making (see chart for details).  Clinical Course    She presents with chest pain. Story somewhat suspicious for ACS and he has had a history of pain in the past based on chart review. He is followed by Dr. Gwenlyn Found. EKG is nonischemic. Initial troponin negative. Patient given aspirin and nitroglycerin ointment.  Per Dr. Kennon Holter last note, patient likely requires functional imaging. Will have cardiology evaluate.  7:53 AM On recheck, patient's pain is now 2 out of 10. Nitroglycerin paste just applied. Will recheck again.  Final Clinical Impressions(s) / ED Diagnoses   Final diagnoses:  None    New Prescriptions New Prescriptions   No medications on file     Merryl Hacker, MD 11/01/15 [phone]

## 2015-11-01 NOTE — Care Management Note (Signed)
Case Management Note  Patient Details  Name: Raymond Patrick MRN: [id number] Date of Birth: 1935-03-03  Subjective/Objective:                  80 year old male with a history of COPD, lung cancer, hypertension, hyperlipidemia who presents with chest pain. Reports that chest pain woke him up from sleep at 5 AM. Pressure and radiating into the left arm. He thought he had indigestion and took baking soda which "may be helped." From home.  Action/Plan: Follow for disposition needs.   Expected Discharge Date:  11/03/15               Expected Discharge Plan:  [place]  In-House Referral:  NA  Discharge planning Services  CM Consult  Post Acute Care Choice:    Choice offered to:     DME Arranged:    DME Agency:     HH Arranged:    HH Agency:     Status of Service:  In process, will continue to follow  If discussed at Long Length of Stay Meetings, dates discussed:    Additional Comments:  Fuller Mandril, RN 11/01/2015, 3:50 PM

## 2015-11-01 NOTE — H&P (Signed)
Patient ID: Raymond Patrick MRN: [id number], DOB/AGE: [birth year]   Admit date: 11/01/2015   Primary Physician: Alesia Richards, MD Primary Cardiologist: Dr. Gwenlyn Found  Reason for admission: chest pain   Pt. Profile:  Raymond Patrick is a 80 y.o. male with a history of COPD, previous heavy tobacco abuse now quit, HTN, HLD, lung cancer s/p R lobectomy who presented to [facility] with chest pain.   He was seen by Dr. Gwenlyn Found in 09/2015 for evaluation of chest pain. Plan was for functional study if chest pain returns. He felt it was more musculoskeletal then.   This AM he was awoken by chest pain that radiated to his left arm. Associated with mild SOB. No n/v. Not worse with exertion. Still has some residual pressure currently. No palpitations, LE edema, orthopnea or PND. No dizziness or syncope. No weakness or difficulties with speech. No fevers or chills. He usually walks 30 minutes to an hour everyday with no issues.   He had a watery green stool this AM which helped relieve chest pain but still has some mild pressure.   Problem List  Past Medical History:  Diagnosis Date  . Allergy    seasonal  . Bladder tumor   . BPH (benign prostatic hypertrophy)   . Cancer ([place])    bladder,, lung  . COPD (chronic obstructive pulmonary disease) ([place])   . History of kidney stones   . History of lung cancer 2008  S/P RIGHT UPPER LOPECTOMY    STAGE I  NON-SMALL CELL LUNG CANCER--  ONCOLOGIST- DR MOHAMED-   NO RECURRENCE  . Hyperlipidemia   . Hypertension   . Impaired hearing bilateral aids  . Irregular heart rate    per pt heart will skip a beat   . Unspecified vitamin D deficiency     Past Surgical History:  Procedure Laterality Date  . COLONOSCOPY  05-09-2004   HPP and tics Kaplan   . FIBEROPTIC BRONCHOSCOPY  10-28-2008  . LITHOTRIPSY  2012  . LUNG REMOVAL, PARTIAL  2008  . PROSTATE SURGERY     prostatectomy  . RIGHT UPPER LUNG LOBECTOMY  09-21-2006  DR BURNEY   NON-SMALL CELL  CANCER/ SEVERE COPD  . TRANSURETHRAL RESECTION OF BLADDER TUMOR  12/25/2011   Procedure: TRANSURETHRAL RESECTION OF BLADDER TUMOR (TURBT);  Surgeon: Franchot Gallo, MD;  Location: [facility];  Service: Urology;  Laterality: N/A;  . TRANSURETHRAL RESECTION OF PROSTATE  12/25/2011   Procedure: TRANSURETHRAL RESECTION OF THE PROSTATE WITH GYRUS INSTRUMENTS;  Surgeon: Franchot Gallo, MD;  Location: [facility];  Service: Urology;  Laterality: N/A;  2 HRS      Allergies  Allergies  Allergen Reactions  . Nitrofuran Derivatives Shortness Of Breath    macrobid     Home Medications  Prior to Admission medications   Medication Sig Start Date End Date Taking? Authorizing Provider  albuterol (PROVENTIL HFA;VENTOLIN HFA) 108 (90 Base) MCG/ACT inhaler Inhale 1-2 puffs into the lungs every 6 (six) hours as needed for wheezing or shortness of breath. 09/06/15  Yes Vicie Mutters, PA-C  aspirin 81 MG tablet Take 81 mg by mouth daily.   Yes Historical Provider, MD  Cholecalciferol (VITAMIN D) 2000 UNITS CAPS Take 8,000 Units by mouth daily.    Yes Historical Provider, MD  Multiple Vitamin (MULTIVITAMIN WITH MINERALS) TABS Take 1 tablet by mouth every evening.    Yes Historical Provider, MD  Omega-3 Fatty Acids (FISH OIL) 1000 MG CAPS Take 1  capsule by mouth every evening.    Yes Historical Provider, MD  OVER THE COUNTER MEDICATION Reported on 01/26/2015   Yes Historical Provider, MD  pravastatin (PRAVACHOL) 40 MG tablet Take 1 tablet (40 mg total) by mouth at bedtime. 03/22/15  Yes [name], MD  terazosin (HYTRIN) 10 MG capsule TAKE 1 CAPSULE(10 MG) BY MOUTH DAILY 09/18/15  Yes [name], MD  vitamin C (ASCORBIC ACID) 500 MG tablet Take 500 mg by mouth daily.   Yes Historical Provider, MD  cetirizine (ZYRTEC) 10 MG tablet Take 1 tablet (10 mg total) by mouth daily. One tab daily for allergies Patient not taking: Reported on 11/01/2015 02/10/15   Loma Sousa  Forcucci, PA-C  predniSONE (DELTASONE) 20 MG tablet 2 tablets daily for 3 days, 1 tablet daily for 4 days. Patient not taking: Reported on 11/01/2015 09/06/15   Vicie Mutters, PA-C    Family History  Family History  Problem Relation Age of Onset  . Cancer Mother     Non-Hodgkin's Lymphoma  . Diabetes Mother   . Hypertension Mother   . Colon polyps Mother   . Cancer Father     Colon cancer w/ metastasis to lung & bone  . Colon cancer Father   . Cancer Brother 58    Lung cancer  . Diabetes Maternal Grandmother   . Heart failure Maternal Grandmother   . Cancer Brother 65    Non-small cell Lung cancer  . Pneumonia Maternal Grandfather   . Esophageal cancer Neg Hx   . Rectal cancer Neg Hx   . Stomach cancer Neg Hx    Family Status  Relation Status  . Mother Deceased at age 42  . Father Deceased at age 35  . Brother Deceased at age 33  . Maternal Grandmother Deceased at age 74  . Brother Deceased at age 7  . Maternal Grandfather Deceased at age 63  . Paternal Grandmother Deceased at age 63   Broken hip-did not recover  . Paternal Grandfather Deceased at age 63   Leg amputation  . Neg Hx      Social History  Social History   Social History  . Marital status: Single    Spouse name: N/A  . Number of children: N/A  . Years of education: N/A   Occupational History  . Not on file.   Social History Main Topics  . Smoking status: Former Smoker    Packs/day: 1.00    Years: 35.00    Types: Cigarettes    Quit date: 02/07/2004  . Smokeless tobacco: Never Used  . Alcohol use 0.0 oz/week     Comment: socially  . Drug use: No  . Sexual activity: Not on file   Other Topics Concern  . Not on file   Social History Narrative  . No narrative on file     Review of Systems General:  No chills, fever, night sweats or weight changes.  Cardiovascular:  +++ chest pain, NO dyspnea on exertion, edema, orthopnea, palpitations, paroxysmal nocturnal dyspnea. Dermatological: No  rash, lesions/masses Respiratory: No cough, dyspnea Urologic: No hematuria, dysuria Abdominal:   No nausea, vomiting,+++ green diarrhea, NO bright red blood per rectum, melena, or hematemesis Neurologic:  No visual changes, wkns, changes in mental status. All other systems reviewed and are otherwise negative except as noted above.  Physical Exam  Blood pressure 118/72, pulse 63, temperature 97.5 F (36.4 C), temperature source Oral, resp. rate 16, height '5\' 5"'$  (1.651 m), weight 165 lb (74.8 kg),  SpO2 95 %.  General: Pleasant, NAD Psych: Normal affect. Neuro: Alert and oriented X 3. Moves all extremities spontaneously. HEENT: Normal  Neck: Supple without bruits or JVD. Lungs:  Resp regular and unlabored, CTA. Heart: RRR no s3, s4, or murmurs. Abdomen: Soft, non-tender, non-distended, BS + x 4.  Extremities: No clubbing, cyanosis or edema. DP/PT/Radials 2+ and equal bilaterally.  Labs  No results for input(s): CKTOTAL, CKMB, TROPONINI in the last 72 hours. Lab Results  Component Value Date   WBC 6.0 11/01/2015   HGB 13.6 11/01/2015   HCT 41.5 11/01/2015   MCV 95.4 11/01/2015   PLT 150 11/01/2015    Recent Labs Lab 11/01/15 0641  NA 143  K 3.5  CL 108  CO2 29  BUN 9  CREATININE 0.92  CALCIUM 9.4  GLUCOSE 112*   Lab Results  Component Value Date   CHOL 127 09/06/2015   HDL 42 09/06/2015   LDLCALC 70 09/06/2015   TRIG 77 09/06/2015   No results found for: DDIMER   Radiology/Studies  Dg Chest 1 View  Result Date: 10/22/2015 CLINICAL DATA:  Right lung cancer.  Status post partial lobectomy. EXAM: CHEST 1 VIEW COMPARISON:  10/23/2014 FINDINGS: Postoperative changes in the right upper lobe. Lungs are clear. Heart is normal size. No effusions or acute bony abnormality. Mild rightward scoliosis in the thoracic spine. IMPRESSION: No active disease. Electronically Signed   By: Rolm Baptise M.D.   On: 10/22/2015 10:51   Dg Chest 2 View  Result Date: 11/01/2015 CLINICAL  DATA:  Awakened this morning with chest pain, no shortness of breath, history of lung malignancy status post partial lung resection in 2008. EXAM: CHEST  2 VIEW COMPARISON:  Portable chest x-ray of October 22, 2015 FINDINGS: The lungs are well-expanded. There is no focal infiltrate nor parenchymal mass. There is no pneumothorax or pleural effusion. The heart and pulmonary vascularity are normal. The mediastinum is normal in width. There is calcification in the wall of the aortic arch. There are is stable moderate dextrocurvature centered in the mid to lower thoracic spine. IMPRESSION: No active cardiopulmonary disease. Aortic atherosclerosis. Electronically Signed   By: David  Martinique M.D.   On: 11/01/2015 07:22    ECG  NSR with PACS and PVCs  ASSESSMENT AND PLAN  Chest pain concerning for [place]: chest pain a little concerning for ischemia. Troponin negative x1 and ECG with no acute ST/TW changes. Follow serial cardiac enzymes. If rules in will plan for cath. If rules out will plan for inpatient vs outpatient stress test. Will place on IV heparin  HTN: BP currently well controlled   HLD: continue statin  Lung cancer s/p R lobectomy: stable  Signed, Angelena Form, PA-C 11/01/2015, 10:09 AM  Pager [phone]

## 2015-11-01 NOTE — ED Notes (Addendum)
Patient stated he had a bright green bowel movement said he never had one like that before.

## 2015-11-01 NOTE — ED Notes (Signed)
Cardiology team at bedside

## 2015-11-01 NOTE — ED Notes (Signed)
Patient ambulated with steady gait to restroom.  On return patient states "I have no pain now, but I think the bowel movement helped more than anything.

## 2015-11-01 NOTE — ED Notes (Signed)
EKG completed no labs drawn in triage

## 2015-11-01 NOTE — Progress Notes (Signed)
ANTICOAGULATION CONSULT NOTE - Initial Consult  Pharmacy Consult for heparin Indication: chest pain/ACS  Allergies  Allergen Reactions  . Nitrofuran Derivatives Shortness Of Breath    macrobid    Patient Measurements: Height: '5\' 5"'$  (165.1 cm) Weight: 165 lb (74.8 kg) IBW/kg (Calculated) : 61.5 Heparin Dosing Weight: 74.8  Vital Signs: Temp: 97.8 F (36.6 C) (09/25 1929) Temp Source: Oral (09/25 1929) BP: 107/58 (09/25 1929) Pulse Rate: 67 (09/25 1929)  Labs:  Recent Labs  11/01/15 0641 11/01/15 1658 11/01/15 2114  HGB 13.6  --   --   HCT 41.5  --   --   PLT 150  --   --   LABPROT  --  14.6  --   INR  --  1.14  --   HEPARINUNFRC  --   --  0.61  CREATININE 0.92  --   --     Estimated Creatinine Clearance: 63.5 mL/min (by C-G formula based on SCr of 0.92 mg/dL).  Assessment: 80 yo male admitted for chest pain, started on IV heparin. Heparin level 0.61, therapeutic on 900 units/hr  Goal of Therapy:  Heparin level 0.3-0.7 units/ml Monitor platelets by anticoagulation protocol: Yes   Plan:  Continue heparin infusion at current rate. F/u AM labs  Maryanna Shape, PharmD, BCPS  Clinical Pharmacist  Pager: [phone]   11/01/15 9:54 PM

## 2015-11-01 NOTE — Progress Notes (Signed)
ANTICOAGULATION CONSULT NOTE - Initial Consult  Pharmacy Consult for heparin Indication: chest pain/ACS  Allergies  Allergen Reactions  . Nitrofuran Derivatives Shortness Of Breath    macrobid    Patient Measurements: Height: '5\' 5"'$  (165.1 cm) Weight: 165 lb (74.8 kg) IBW/kg (Calculated) : 61.5 Heparin Dosing Weight: 74.8  Vital Signs: Temp: 97.5 F (36.4 C) (09/25 0631) Temp Source: Oral (09/25 0631) BP: 101/69 (09/25 1256) Pulse Rate: 65 (09/25 1256)  Labs:  Recent Labs  11/01/15 0641  HGB 13.6  HCT 41.5  PLT 150  CREATININE 0.92    Estimated Creatinine Clearance: 63.5 mL/min (by C-G formula based on SCr of 0.92 mg/dL).   Medical History: Past Medical History:  Diagnosis Date  . Allergy    seasonal  . Bladder tumor   . BPH (benign prostatic hypertrophy)   . Cancer ([place])    bladder,, lung  . COPD (chronic obstructive pulmonary disease) ([place])   . History of kidney stones   . History of lung cancer 2008  S/P RIGHT UPPER LOPECTOMY    STAGE I  NON-SMALL CELL LUNG CANCER--  ONCOLOGIST- DR MOHAMED-   NO RECURRENCE  . Hyperlipidemia   . Hypertension   . Impaired hearing bilateral aids  . Irregular heart rate    per pt heart will skip a beat   . Unspecified vitamin D deficiency     Assessment: 80 yo male admitted for chest pain. Concern for ischemia per physician notes. Troponin negative x1 and cardiac work-up pending. No oral anticoagulation prior to admission. CBC stable with no overt signs or symptoms of bleeding noted.   Goal of Therapy:  Heparin level 0.3-0.7 units/ml Monitor platelets by anticoagulation protocol: Yes   Plan:  Give 4000 units bolus x 1 Start heparin infusion at 900  units/hr Check anti-Xa level in 8 hours and daily while on heparin Continue to monitor H&H and platelets  Argie Ramming, PharmD Pharmacy Resident  Pager [phone] 11/01/15 1:13 PM

## 2015-11-01 NOTE — ED Notes (Signed)
Attempted report 

## 2015-11-01 NOTE — Progress Notes (Signed)
11/01/2015 1615 Accepted to room 2W01 a pt from ED whose chief complaint was CP.  Pt is not in any pain at this time.  Tele monitor is applied and CCMD notified.  Oriented to room, call light and bed.  Call bell in reach. [place]

## 2015-11-01 NOTE — ED Notes (Signed)
Patient transported to X-ray 

## 2015-11-01 NOTE — ED Triage Notes (Signed)
Patient was awakened by CP and left arm pain.  Thought it maybe indigestion but baking soda and water did not help

## 2015-11-02 ENCOUNTER — Other Ambulatory Visit: Payer: Self-pay | Admitting: Cardiology

## 2015-11-02 DIAGNOSIS — I200 Unstable angina: Secondary | ICD-10-CM | POA: Diagnosis not present

## 2015-11-02 DIAGNOSIS — J449 Chronic obstructive pulmonary disease, unspecified: Secondary | ICD-10-CM | POA: Diagnosis not present

## 2015-11-02 DIAGNOSIS — R079 Chest pain, unspecified: Secondary | ICD-10-CM | POA: Diagnosis not present

## 2015-11-02 DIAGNOSIS — I10 Essential (primary) hypertension: Secondary | ICD-10-CM | POA: Diagnosis not present

## 2015-11-02 DIAGNOSIS — I249 Acute ischemic heart disease, unspecified: Secondary | ICD-10-CM | POA: Diagnosis not present

## 2015-11-02 DIAGNOSIS — R7303 Prediabetes: Secondary | ICD-10-CM | POA: Diagnosis not present

## 2015-11-02 DIAGNOSIS — E782 Mixed hyperlipidemia: Secondary | ICD-10-CM | POA: Diagnosis not present

## 2015-11-02 DIAGNOSIS — E785 Hyperlipidemia, unspecified: Secondary | ICD-10-CM | POA: Diagnosis not present

## 2015-11-02 DIAGNOSIS — Z902 Acquired absence of lung [part of]: Secondary | ICD-10-CM | POA: Diagnosis not present

## 2015-11-02 DIAGNOSIS — N400 Enlarged prostate without lower urinary tract symptoms: Secondary | ICD-10-CM | POA: Diagnosis not present

## 2015-11-02 DIAGNOSIS — Z87891 Personal history of nicotine dependence: Secondary | ICD-10-CM | POA: Diagnosis not present

## 2015-11-02 DIAGNOSIS — Z7982 Long term (current) use of aspirin: Secondary | ICD-10-CM | POA: Diagnosis not present

## 2015-11-02 DIAGNOSIS — Z85118 Personal history of other malignant neoplasm of bronchus and lung: Secondary | ICD-10-CM | POA: Diagnosis not present

## 2015-11-02 LAB — CBC
HEMATOCRIT: 36.7 % — AB (ref 39.0–52.0)
HEMOGLOBIN: 12 g/dL — AB (ref 13.0–17.0)
MCH: 31 pg (ref 26.0–34.0)
MCHC: 32.7 g/dL (ref 30.0–36.0)
MCV: 94.8 fL (ref 78.0–100.0)
Platelets: 128 10*3/uL — ABNORMAL LOW (ref 150–400)
RBC: 3.87 MIL/uL — AB (ref 4.22–5.81)
RDW: 13.1 % (ref 11.5–15.5)
WBC: 7 10*3/uL (ref 4.0–10.5)

## 2015-11-02 LAB — COMPREHENSIVE METABOLIC PANEL
ALBUMIN: 3.4 g/dL — AB (ref 3.5–5.0)
ALK PHOS: 57 U/L (ref 38–126)
ALT: 14 U/L — AB (ref 17–63)
AST: 18 U/L (ref 15–41)
Anion gap: 5 (ref 5–15)
BILIRUBIN TOTAL: 0.6 mg/dL (ref 0.3–1.2)
BUN: 10 mg/dL (ref 6–20)
CALCIUM: 8.9 mg/dL (ref 8.9–10.3)
CO2: 29 mmol/L (ref 22–32)
CREATININE: 0.88 mg/dL (ref 0.61–1.24)
Chloride: 109 mmol/L (ref 101–111)
GFR calc Af Amer: >60 mL/min (ref >60)
GFR calc non Af Amer: >60 mL/min (ref >60)
GLUCOSE: 92 mg/dL (ref 65–99)
Potassium: 3.7 mmol/L (ref 3.5–5.1)
SODIUM: 143 mmol/L (ref 135–145)
Total Protein: 5.2 g/dL — ABNORMAL LOW (ref 6.5–8.1)

## 2015-11-02 LAB — HEMOGLOBIN A1C
Hgb A1c MFr Bld: 5.4 % (ref 4.8–5.6)
Mean Plasma Glucose: 108 mg/dL

## 2015-11-02 LAB — LIPID PANEL
Cholesterol: 117 mg/dL (ref 0–200)
HDL: 36 mg/dL — AB (ref >40)
LDL CALC: 68 mg/dL (ref 0–99)
Total CHOL/HDL Ratio: 3.3 RATIO
Triglycerides: 67 mg/dL (ref <150)
VLDL: 13 mg/dL (ref 0–40)

## 2015-11-02 LAB — PROTIME-INR
INR: 1.17
Prothrombin Time: 14.9 seconds (ref 11.4–15.2)

## 2015-11-02 LAB — TROPONIN I

## 2015-11-02 LAB — HEPARIN LEVEL (UNFRACTIONATED): HEPARIN UNFRACTIONATED: 0.63 [IU]/mL (ref 0.30–0.70)

## 2015-11-02 MED ORDER — NITROGLYCERIN 0.4 MG SL SUBL: 0.4000 mg | SUBLINGUAL_TABLET | SUBLINGUAL | 2 refills | Status: DC | PRN

## 2015-11-02 NOTE — Progress Notes (Signed)
ANTICOAGULATION CONSULT NOTE - Initial Consult  Pharmacy Consult for heparin Indication: chest pain/ACS  Allergies  Allergen Reactions  . Nitrofuran Derivatives Shortness Of Breath    macrobid   Patient Measurements: Height: '5\' 5"'$  (165.1 cm) Weight: 165 lb 4.8 oz (75 kg) IBW/kg (Calculated) : 61.5 Heparin Dosing Weight: 74.8  Vital Signs: Temp: 98.4 F (36.9 C) (09/26 0514) Temp Source: Oral (09/26 0514) BP: 111/58 (09/26 0514) Pulse Rate: 67 (09/26 0514)  Labs:  Recent Labs  11/01/15 0641 11/01/15 1658 11/01/15 2114 11/02/15 0310  HGB 13.6  --   --  12.0*  HCT 41.5  --   --  36.7*  PLT 150  --   --  128*  LABPROT  --  14.6  --  14.9  INR  --  1.14  --  1.17  HEPARINUNFRC  --   --  0.61 0.63  CREATININE 0.92  --   --  0.88   Estimated Creatinine Clearance: 66.5 mL/min (by C-G formula based on SCr of 0.88 mg/dL).  Assessment: 80 yo male admitted for chest pain, started on IV heparin. Heparin level has been therapeutic  twice (0.61, 0.63) on current infusion rate. Nurse reports no issues with bleeding for IV lines.   Goal of Therapy:  Heparin level 0.3-0.7 units/ml Monitor platelets by anticoagulation protocol: Yes   Plan:  Heparin infusion at 900 units/hr Daily heparin level and CBC  Georga Bora, PharmD Clinical Pharmacist Pager: [phone] 11/02/2015 9:37 AM

## 2015-11-02 NOTE — Care Management Obs Status (Signed)
[facility] NOTIFICATION   Patient Details  Name: Raymond Patrick MRN: [id number] Date of Birth: 04/10/1935   Medicare Observation Status Notification Given:  Yes    Dawayne Patricia, RN 11/02/2015, 11:37 AM

## 2015-11-02 NOTE — Progress Notes (Signed)
Discharge Note:  Patient alert and oriented X 4 and in no distress.  Patient given discharge instructions regarding signs and symptoms to report, medication, diet, activity, and upcoming appointments. He verbalized understanding of all instructions.  Telemetry and peripheral IV discontinued.  Patient confirmed that he had all of his personal belongings.  Patient transported out via wheelchair by hospital volunteer.

## 2015-11-02 NOTE — Discharge Summary (Signed)
Discharge Summary    Patient ID: ROSALIE Patrick,  MRN: [id number], DOB/AGE: [birth year] 80 y.o.  Admit date: 11/01/2015 Discharge date: 11/02/2015  Primary Care Provider: [facility] DAVID Primary Cardiologist: Dr. Gwenlyn Found  Discharge Diagnoses    Active Problems:   Unstable angina ([place])   Allergies Allergies  Allergen Reactions  . Nitrofuran Derivatives Shortness Of Breath    macrobid    Diagnostic Studies/Procedures  None _____________   History of Present Illness   Raymond Patrick is a 80 y.o. male with a history of COPD, previous heavy tobacco abuse now quit, HTN, HLD, lung cancer s/p R lobectomy who presented to [facility] with chest pain.   He was seen by Dr. Gwenlyn Found in 09/2015 for evaluation of chest pain. Plan was for functional study if chest pain returns. He felt it was more musculoskeletal then.   On the morning of 11/01/15, he was awakened by chest pain that radiated to his left arm. Associated with mild SOB. No n/v. Not worse with exertion. Still has some residual pressure. No palpitations, LE edema, orthopnea or PND. No dizziness or syncope. No weakness or difficulties with speech. No fevers or chills. He usually walks 30 minutes to an hour everyday with no issues.   He had a watery green stool shortly after which helped relieve chest pain but still has some mild pressure.   Hospital Course  He was admitted for further evaluation. His troponin was negative x 2. His EKG showed no acute ST/T wave changes.   He had no chest pain overnight and his BP was well controlled.   He was scheduled for an outpatient nuclear study to evaluate for ischemia.   He will remain on daily ASA and moderate intensity statin. He was seen today by Dr. Ellyn Hack and deemed suitable for discharge.   _____________  Discharge Vitals Blood pressure (!) 111/58, pulse 67, temperature 98.4 F (36.9 C), temperature source Oral, resp. rate 18, height '5\' 5"'$  (1.651 m), weight 165 lb 4.8 oz  (75 kg), SpO2 95 %.  Filed Weights   11/01/15 0634 11/02/15 0514  Weight: 165 lb (74.8 kg) 165 lb 4.8 oz (75 kg)   General: Pleasant, NAD Psych: Normal affect. Neuro: Alert and oriented X 3. Moves all extremities spontaneously. HEENT: Normal           Neck: Supple without bruits or JVD. Lungs:  Resp regular and unlabored, CTA. Heart: RRR no s3, s4, or murmurs. Abdomen: Soft, non-tender, non-distended, BS + x 4.  Extremities: No clubbing, cyanosis or edema. DP/PT/Radials 2+ and equal bilaterally.  Labs & Radiologic Studies     CBC  Recent Labs  11/01/15 0641 11/02/15 0310  WBC 6.0 7.0  NEUTROABS 3.1  --   HGB 13.6 12.0*  HCT 41.5 36.7*  MCV 95.4 94.8  PLT 150 846*   Basic Metabolic Panel  Recent Labs  11/01/15 0641 11/02/15 0310  NA 143 143  K 3.5 3.7  CL 108 109  CO2 29 29  GLUCOSE 112* 92  BUN 9 10  CREATININE 0.92 0.88  CALCIUM 9.4 8.9   Liver Function Tests  Recent Labs  11/02/15 0310  AST 18  ALT 14*  ALKPHOS 57  BILITOT 0.6  PROT 5.2*  ALBUMIN 3.4*   Cardiac Enzymes  Recent Labs  11/02/15 0925  TROPONINI <0.03   Hemoglobin A1C  Recent Labs  11/01/15 1658  HGBA1C 5.4   Fasting Lipid Panel  Recent Labs  11/02/15 0310  CHOL  117  HDL 36*  LDLCALC 68  TRIG 67  CHOLHDL 3.3    Dg Chest 1 View  Result Date: 10/22/2015 CLINICAL DATA:  Right lung cancer.  Status post partial lobectomy. EXAM: CHEST 1 VIEW COMPARISON:  10/23/2014 FINDINGS: Postoperative changes in the right upper lobe. Lungs are clear. Heart is normal size. No effusions or acute bony abnormality. Mild rightward scoliosis in the thoracic spine. IMPRESSION: No active disease. Electronically Signed   By: Rolm Baptise M.D.   On: 10/22/2015 10:51   Dg Chest 2 View  Result Date: 11/01/2015 CLINICAL DATA:  Awakened this morning with chest pain, no shortness of breath, history of lung malignancy status post partial lung resection in 2008. EXAM: CHEST  2 VIEW COMPARISON:   Portable chest x-ray of October 22, 2015 FINDINGS: The lungs are well-expanded. There is no focal infiltrate nor parenchymal mass. There is no pneumothorax or pleural effusion. The heart and pulmonary vascularity are normal. The mediastinum is normal in width. There is calcification in the wall of the aortic arch. There are is stable moderate dextrocurvature centered in the mid to lower thoracic spine. IMPRESSION: No active cardiopulmonary disease. Aortic atherosclerosis. Electronically Signed   By: David  Martinique M.D.   On: 11/01/2015 07:22    Disposition   Pt is being discharged home today in good condition.  Follow-up Plans & Appointments    Follow-up Information    [place] [facility] Follow up on 11/09/2015.   Specialty:  Cardiology Why:  at 7:45 am for stress test. Nothing to eat or drink after midnight.  Contact information: [address], [place], PA-C Follow up on 11/12/2015.   Specialties:  Cardiology, Radiology Why:  at 8am for follow up.  Contact information: [address] [place]  [id number] [phone]          Discharge Instructions    Diet - low sodium heart healthy    Complete by:  As directed    Increase activity slowly    Complete by:  As directed       Discharge Medications   Current Discharge Medication List    START taking these medications   Details  nitroGLYCERIN (NITROSTAT) 0.4 MG SL tablet Place 1 tablet (0.4 mg total) under the tongue every 5 (five) minutes x 3 doses as needed for chest pain. Qty: 25 tablet, Refills: 2      CONTINUE these medications which have NOT CHANGED   Details  albuterol (PROVENTIL HFA;VENTOLIN HFA) 108 (90 Base) MCG/ACT inhaler Inhale 1-2 puffs into the lungs every 6 (six) hours as needed for wheezing or shortness of breath. Qty: 18 g, Refills: 2    aspirin 81 MG tablet Take 81 mg by mouth daily.   Associated  Diagnoses: Malignant neoplasm of bronchus and lung, unspecified site    Cholecalciferol (VITAMIN D) 2000 UNITS CAPS Take 8,000 Units by mouth daily.     Multiple Vitamin (MULTIVITAMIN WITH MINERALS) TABS Take 1 tablet by mouth every evening.     Omega-3 Fatty Acids (FISH OIL) 1000 MG CAPS Take 1 capsule by mouth every evening.     OVER THE COUNTER MEDICATION Reported on 01/26/2015    pravastatin (PRAVACHOL) 40 MG tablet Take 1 tablet (40 mg total) by mouth at bedtime. Qty: 90 tablet, Refills: 1    terazosin (HYTRIN) 10 MG capsule TAKE 1 CAPSULE(10 MG) BY MOUTH DAILY Qty: 90 capsule,  Refills: 1    vitamin C (ASCORBIC ACID) 500 MG tablet Take 500 mg by mouth daily.    cetirizine (ZYRTEC) 10 MG tablet Take 1 tablet (10 mg total) by mouth daily. One tab daily for allergies Qty: 30 tablet, Refills: 3      STOP taking these medications     predniSONE (DELTASONE) 20 MG tablet         Outstanding Labs/Studies  Lex[facility] Myoview - Oct. 3rd 7:45 am   Duration of Discharge Encounter   Greater than 30 minutes including physician time.  Signed, Arbutus Leas NP 11/02/2015, 11:50 AM

## 2015-11-04 ENCOUNTER — Telehealth (HOSPITAL_COMMUNITY): Payer: Self-pay | Admitting: *Deleted

## 2015-11-04 NOTE — Telephone Encounter (Signed)
Left message on voicemail in reference to upcoming appointment scheduled for  11/09/15. Phone number given for a call back so details instructions can be given. Raymond Patrick

## 2015-11-09 ENCOUNTER — Ambulatory Visit (INDEPENDENT_AMBULATORY_CARE_PROVIDER_SITE_OTHER): Payer: PPO | Admitting: Internal Medicine

## 2015-11-09 ENCOUNTER — Encounter: Payer: Self-pay | Admitting: Internal Medicine

## 2015-11-09 ENCOUNTER — Ambulatory Visit (HOSPITAL_COMMUNITY): Payer: PPO | Attending: Cardiovascular Disease

## 2015-11-09 VITALS — BP 122/70 | HR 68 | Temp 97.5°F | Resp 16 | Ht 64.5 in | Wt 163.6 lb

## 2015-11-09 DIAGNOSIS — R079 Chest pain, unspecified: Secondary | ICD-10-CM | POA: Diagnosis not present

## 2015-11-09 DIAGNOSIS — R9439 Abnormal result of other cardiovascular function study: Secondary | ICD-10-CM | POA: Insufficient documentation

## 2015-11-09 DIAGNOSIS — I10 Essential (primary) hypertension: Secondary | ICD-10-CM | POA: Insufficient documentation

## 2015-11-09 DIAGNOSIS — Z23 Encounter for immunization: Secondary | ICD-10-CM

## 2015-11-09 DIAGNOSIS — R0609 Other forms of dyspnea: Secondary | ICD-10-CM | POA: Diagnosis not present

## 2015-11-09 DIAGNOSIS — A084 Viral intestinal infection, unspecified: Secondary | ICD-10-CM | POA: Diagnosis not present

## 2015-11-09 LAB — MYOCARDIAL PERFUSION IMAGING
CHL CUP NUCLEAR SSS: 4
CSEPPHR: 101 {beats}/min
LHR: 0.29
LV sys vol: 36 mL
LVDIAVOL: 84 mL (ref 62–150)
Rest HR: 71 {beats}/min
SDS: 0
SRS: 4
TID: 1.06

## 2015-11-09 MED ORDER — TECHNETIUM TC 99M TETROFOSMIN IV KIT
10.7000 | PACK | Freq: Once | INTRAVENOUS | Status: AC | PRN
Start: 2015-11-09 — End: 2015-11-09
  Administered 2015-11-09: 11 via INTRAVENOUS
  Filled 2015-11-09: qty 11

## 2015-11-09 MED ORDER — TECHNETIUM TC 99M TETROFOSMIN IV KIT
31.8000 | PACK | Freq: Once | INTRAVENOUS | Status: AC | PRN
  Administered 2015-11-09: 31.8 via INTRAVENOUS
  Filled 2015-11-09: qty 32

## 2015-11-09 MED ORDER — REGADENOSON 0.4 MG/5ML IV SOLN
0.4000 mg | Freq: Once | INTRAVENOUS | Status: AC
Start: 2015-11-09 — End: 2015-11-09
  Administered 2015-11-09: 0.4 mg via INTRAVENOUS

## 2015-11-09 NOTE — Progress Notes (Signed)
  Subjective:    Patient ID: Raymond Patrick, male    DOB: 1935/12/24, 80 y.o.   MRN: [id number]  HPI  This very nice 80 yo SWM with HTN, HLD, PreDM, remote Lung Ca (2008) & Bladder/Prostate Ca (2014) recently presented to the hospital with query CP & Lt. arm pain and was hospitalized overnight 9/25-26 with negative troponin's x 3 , neg. CXR and EKG's. Patient was d/c'd and completed provocative nuclear Stress Test earlier today.  Patient denies any CP preceding or post hospitalization. Patient does recount some vague indigestion prehospitalization which seemed to resolve after a diarrheal BM in the hospital with no further GI sx's.  Medication Sig  . albuterol  HFA inhaler 1-2 puffs every 6  hrs as needed   . aspirin 81 MG tab Take daily.  . cetirizine 10 MG tab Take 1 tab daily  . VITAMIN D 2000 UNITS  Take 8,000 Unitsdaily.   . Multi-Vit w/Min Take 1 tab every evening.   Marland Kitchen NITROSTAT 0.4 MG SL  as needed for chest pain.  . Omega-3 FISH OIL 1000 MG  Take 1 cap every evening.   . pravastatin  40 MG tab Take 1 tab at bedtime.  Marland Kitchen terazosin  10 MG cap TAKE 1 CAP DAILY  . vitamin C  500 MG tab Take  daily.   Allergies  Allergen Reactions  . Nitrofuran Derivatives Shortness Of Breath    macrobid   Past Medical History:  Diagnosis Date  . Allergy    seasonal  . Bladder tumor   . BPH (benign prostatic hypertrophy)   . Cancer ([place])    bladder,, lung  . COPD (chronic obstructive pulmonary disease) ([place])   . History of kidney stones   . History of lung cancer 2008  S/P RIGHT UPPER LOPECTOMY    STAGE I  NON-SMALL CELL LUNG CANCER--  ONCOLOGIST- DR MOHAMED-   NO RECURRENCE  . Hyperlipidemia   . Hypertension   . Impaired hearing bilateral aids  . Irregular heart rate    per pt heart will skip a beat   . Unspecified vitamin D deficiency    Review of Systems  10 point systems review negative except as above.    Objective:   Physical Exam  BP 122/70   Pulse 68   Temp 97.5 F (36.4  C)   Resp 16   Ht 5' 4.5" (1.638 m)   Wt 163 lb 9.6 oz (74.2 kg)   BMI 27.65 kg/m   HEENT - Eac's patent. TM's Nl. EOM's full. PERRLA. O/P clear. Neck - supple. Nl Thyroid. Carotids 2+ & No bruits, nodes, JVD Chest - Clear equal BS. Cor - Nl HS. RRR w/o sig MGR. PP 1(+). No edema. MS- FROM w/o deformities. Muscle power, tone and bulk Nl. Gait Nl. Neuro -  Nl w/o focal abnormalities.    Assessment & Plan:    1. Chest pain, resolved  - doubt cardiac - presuming stress test earlier today was likely negative or he wouldn't have been released.  2. Viral gastroenteritis, transient   -  Patient reassured  - Over 15 minutes of exam, counseling, chart review and critical decision making was performed

## 2015-11-11 ENCOUNTER — Encounter: Payer: Self-pay | Admitting: Internal Medicine

## 2015-11-12 ENCOUNTER — Encounter: Payer: Self-pay | Admitting: Cardiology

## 2015-11-12 ENCOUNTER — Ambulatory Visit (INDEPENDENT_AMBULATORY_CARE_PROVIDER_SITE_OTHER): Payer: PPO | Admitting: Cardiology

## 2015-11-12 DIAGNOSIS — N400 Enlarged prostate without lower urinary tract symptoms: Secondary | ICD-10-CM | POA: Diagnosis not present

## 2015-11-12 DIAGNOSIS — R079 Chest pain, unspecified: Secondary | ICD-10-CM | POA: Diagnosis not present

## 2015-11-12 DIAGNOSIS — E782 Mixed hyperlipidemia: Secondary | ICD-10-CM | POA: Diagnosis not present

## 2015-11-12 DIAGNOSIS — Z85118 Personal history of other malignant neoplasm of bronchus and lung: Secondary | ICD-10-CM | POA: Diagnosis not present

## 2015-11-12 NOTE — Assessment & Plan Note (Signed)
S/P TURP 2013

## 2015-11-12 NOTE — Assessment & Plan Note (Signed)
LDL 68 Sept 2017

## 2015-11-12 NOTE — Assessment & Plan Note (Signed)
Right upper lobe VATS 2008

## 2015-11-12 NOTE — Progress Notes (Signed)
11/12/2015 Raymond Patrick   Aug 25, 1935  [id number]  Primary Physician MCKEOWN,WILLIAM DAVID, MD Primary Cardiologist: Dr Gwenlyn Found  HPI:  Pleasant 80 year old mildly overweight single Caucasian male followed by Dr Gwenlyn Found and Dr Melford Aase. 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 His cardiovascular risk factor profile is remarkable for 50 pack years of tobacco abuse having quit 10 years ago. He has treated hypertension and hyperlipidemia. There is no family history of heart disease. He is never have a heart attack or stroke. Does get mild dyspnea but walks 30-60 minutes daily.  His past medical history otherwise is remarkable for having undergone a VATS procedure by Dr. Baldemar Friday for what turned out to be malignancy. Dr. Earlie Server is his oncologist and released him from his care in Sept.   The pt recently was admitted with chest pain. He ruled out for an MI and was set up for an OP Myoview. This was done 11/09/15 and was low risk. He is in the office today for follow up. He denies any further chest pain.    Current Outpatient Prescriptions  Medication Sig Dispense Refill  . albuterol (PROVENTIL HFA;VENTOLIN HFA) 108 (90 Base) MCG/ACT inhaler Inhale 1-2 puffs into the lungs every 6 (six) hours as needed for wheezing or shortness of breath. 18 g 2  . aspirin 81 MG tablet Take 81 mg by mouth daily.    . cetirizine (ZYRTEC) 10 MG tablet Take 1 tablet (10 mg total) by mouth daily. One tab daily for allergies 30 tablet 3  . Cholecalciferol (VITAMIN D) 2000 UNITS CAPS Take 8,000 Units by mouth daily.     . Multiple Vitamin (MULTIVITAMIN WITH MINERALS) TABS Take 1 tablet by mouth every evening.     . nitroGLYCERIN (NITROSTAT) 0.4 MG SL tablet Place 1 tablet (0.4 mg total) under the tongue every 5 (five) minutes x 3 doses as needed for chest pain. 25 tablet 2  . Omega-3 Fatty Acids (FISH OIL) 1000 MG CAPS Take 1 capsule by mouth every evening.     Marland Kitchen OVER THE COUNTER MEDICATION  Reported on 01/26/2015    . pravastatin (PRAVACHOL) 40 MG tablet Take 1 tablet (40 mg total) by mouth at bedtime. 90 tablet 1  . terazosin (HYTRIN) 10 MG capsule TAKE 1 CAPSULE(10 MG) BY MOUTH DAILY 90 capsule 1  . vitamin C (ASCORBIC ACID) 500 MG tablet Take 500 mg by mouth daily.     No current facility-administered medications for this visit.     Allergies  Allergen Reactions  . Nitrofuran Derivatives Shortness Of Breath    macrobid    Social History   Social History  . Marital status: Single    Spouse name: N/A  . Number of children: N/A  . Years of education: N/A   Occupational History  . Not on file.   Social History Main Topics  . Smoking status: Former Smoker    Packs/day: 1.00    Years: 35.00    Types: Cigarettes    Quit date: 02/07/2004  . Smokeless tobacco: Never Used  . Alcohol use 0.0 oz/week     Comment: socially  . Drug use: No  . Sexual activity: Not on file   Other Topics Concern  . Not on file   Social History Narrative  . No narrative on file     Review of Systems: General: negative for chills, fever, night sweats or weight changes.  Cardiovascular: negative for chest pain, dyspnea on exertion, edema,  orthopnea, palpitations, paroxysmal nocturnal dyspnea or shortness of breath Dermatological: negative for rash Respiratory: negative for cough or wheezing Urologic: negative for hematuria Abdominal: negative for nausea, vomiting, diarrhea, bright red blood per rectum, melena, or hematemesis Neurologic: negative for visual changes, syncope, or dizziness All other systems reviewed and are otherwise negative except as noted above.    Blood pressure 120/70, pulse 68, height '5\' 6"'$  (1.676 m), weight 165 lb 6.4 oz (75 kg).  General appearance: alert, cooperative, no distress and mildly obese Neck: no carotid bruit and no JVD Lungs: clear to auscultation bilaterally Heart: regular rate and rhythm Skin: Skin color, texture, turgor normal. No rashes or  lesions Neurologic: Grossly normal  EKG NSR, PACs, PVCs  ASSESSMENT AND PLAN:   Chest pain with moderate risk for cardiac etiology Low risk Myoview 11/09/15  Hyperlipidemia LDL 68 Sept 2017  History of lung cancer Right upper lobe VATS 2008  History of smoking Quit 2006  BPH (benign prostatic hyperplasia) S/P TURP 2013   PLAN  I discussed the findings of his stress test with him. He has ectopy in his EKG but he tells me he doesn't notice this and has had it "all my life". We discussed what typical angina symptoms are and he assures me he doesn't have any chest pain. Follow up with Dr Gwenlyn Found in 6 months.   Kerin Ransom PA-C 11/12/2015 8:58 AM

## 2015-11-12 NOTE — Assessment & Plan Note (Signed)
Quit 2006

## 2015-11-12 NOTE — Patient Instructions (Signed)
Continue same medications   Your physician wants you to follow-up in: 6 months with Dr.Berry. You will receive a reminder letter in the mail two months in advance. If you don't receive a letter, please call our office to schedule the follow-up appointment.  

## 2015-11-12 NOTE — Assessment & Plan Note (Signed)
Low risk Myoview 11/09/15

## 2015-12-31 NOTE — Patient Instructions (Signed)
[facility]   [name], M.D.    [name]. Silverio Lay, P.A.-C      Starlyn Skeans, P.A.-C  [facility]                [address] [place], [place] [address] Telephone [phone] Telefax [phone] Annual  Screening/Preventative Visit  & Comprehensive Evaluation & Examination     This very nice 80 y.o.male presents for a Screening/Preventative Visit & comprehensive evaluation and management of multiple medical co-morbidities.  Patient has been followed for HTN, T2_NIDDM  Prediabetes, Hyperlipidemia and Vitamin D Deficiency.     HTN predates since     . Patient's BP has been controlled at home.  Today's  . Patient denies any cardiac symptoms as chest pain, palpitations, shortness of breath, dizziness or ankle swelling.     Patient's hyperlipidemia is controlled with diet and medications. Patient denies myalgias or other medication SE's. Last lipids were  Lab Results  Component Value Date   CHOL 117 11/02/2015   HDL 36 (L) 11/02/2015   LDLCALC 68 11/02/2015   TRIG 67 11/02/2015   CHOLHDL 3.3 11/02/2015      Patient has T2_NIDDM prediabetes since    and patient denies reactive hypoglycemic symptoms, visual blurring, diabetic polys or paresthesias. Last A1c was  Lab Results  Component Value Date   HGBA1C 5.4 11/01/2015       Finally, patient has history of Vitamin D Deficiency of    and last vitamin D was  Lab Results  Component Value Date   VD25OH 63 06/04/2015   Current Outpatient Prescriptions on File Prior to Visit  Medication Sig  . albuterol (PROVENTIL HFA;VENTOLIN HFA) 108 (90 Base) MCG/ACT inhaler Inhale 1-2 puffs into the lungs every 6 (six) hours as needed for wheezing or shortness of breath.  Marland Kitchen aspirin 81 MG tablet Take 81 mg by mouth daily.  . cetirizine (ZYRTEC) 10 MG tablet Take 1 tablet (10 mg total) by mouth daily. One tab daily for allergies  . Cholecalciferol (VITAMIN D)  2000 UNITS CAPS Take 8,000 Units by mouth daily.   . Multiple Vitamin (MULTIVITAMIN WITH MINERALS) TABS Take 1 tablet by mouth every evening.   . nitroGLYCERIN (NITROSTAT) 0.4 MG SL tablet Place 1 tablet (0.4 mg total) under the tongue every 5 (five) minutes x 3 doses as needed for chest pain.  . Omega-3 Fatty Acids (FISH OIL) 1000 MG CAPS Take 1 capsule by mouth every evening.   Marland Kitchen OVER THE COUNTER MEDICATION Reported on 01/26/2015  . pravastatin (PRAVACHOL) 40 MG tablet Take 1 tablet (40 mg total) by mouth at bedtime.  Marland Kitchen terazosin (HYTRIN) 10 MG capsule TAKE 1 CAPSULE(10 MG) BY MOUTH DAILY  . vitamin C (ASCORBIC ACID) 500 MG tablet Take 500 mg by mouth daily.   No current facility-administered medications on file prior to visit.    Allergies  Allergen Reactions  . Nitrofuran Derivatives Shortness Of Breath    macrobid   Past Medical History:  Diagnosis Date  . Allergy    seasonal  . Bladder tumor   . BPH (benign prostatic hypertrophy)   . Cancer ([place])    bladder,, lung  . COPD (chronic obstructive pulmonary disease) ([place])   . History of kidney stones   . History of lung cancer 2008  S/P RIGHT UPPER LOPECTOMY    STAGE I  NON-SMALL CELL  LUNG CANCER--  ONCOLOGIST- DR MOHAMED-   NO RECURRENCE  . Hyperlipidemia   . Hypertension   . Impaired hearing bilateral aids  . Irregular heart rate    per pt heart will skip a beat   . Unspecified vitamin D deficiency    Health Maintenance  Topic Date Due  . COLONOSCOPY  01/25/2018  . TETANUS/TDAP  03/09/2020  . INFLUENZA VACCINE  Completed  . ZOSTAVAX  Completed  . PNA vac Low Risk Adult  Completed   Immunization History  Administered Date(s) Administered  . Influenza, High Dose Seasonal PF 11/03/2014  . Influenza,inj,Quad PF,36+ Mos 11/09/2015  . Influenza-Unspecified 11/06/2012  . Pneumococcal Conjugate-13 01/20/2014  . Pneumococcal-Unspecified 02/06/2002, 09/23/2012  . Tdap 03/09/2010  . Zoster 08/03/2014   Past Surgical  History:  Procedure Laterality Date  . COLONOSCOPY  05-09-2004   HPP and tics Kaplan   . FIBEROPTIC BRONCHOSCOPY  10-28-2008  . LITHOTRIPSY  2012  . LUNG REMOVAL, PARTIAL  2008  . PROSTATE SURGERY     prostatectomy  . RIGHT UPPER LUNG LOBECTOMY  09-21-2006  DR BURNEY   NON-SMALL CELL CANCER/ SEVERE COPD  . TRANSURETHRAL RESECTION OF BLADDER TUMOR  12/25/2011   Procedure: TRANSURETHRAL RESECTION OF BLADDER TUMOR (TURBT);  Surgeon: Franchot Gallo, MD;  Location: [facility];  Service: Urology;  Laterality: N/A;  . TRANSURETHRAL RESECTION OF PROSTATE  12/25/2011   Procedure: TRANSURETHRAL RESECTION OF THE PROSTATE WITH GYRUS INSTRUMENTS;  Surgeon: Franchot Gallo, MD;  Location: [facility];  Service: Urology;  Laterality: N/A;  2 HRS    Family History  Problem Relation Age of Onset  . Cancer Mother     Non-Hodgkin's Lymphoma  . Diabetes Mother   . Hypertension Mother   . Colon polyps Mother   . Cancer Father     Colon cancer w/ metastasis to lung & bone  . Colon cancer Father   . Cancer Brother 20    Lung cancer  . Diabetes Maternal Grandmother   . Heart failure Maternal Grandmother   . Cancer Brother 65    Non-small cell Lung cancer  . Pneumonia Maternal Grandfather   . Esophageal cancer Neg Hx   . Rectal cancer Neg Hx   . Stomach cancer Neg Hx     Social History   Social History  . Marital status: Single    Spouse name: N/A  . Number of children: N/A  . Years of education: N/A   Occupational History  . Not on file.   Social History Main Topics  . Smoking status: Former Smoker    Packs/day: 1.00    Years: 35.00    Types: Cigarettes    Quit date: 02/07/2004  . Smokeless tobacco: Never Used  . Alcohol use 0.0 oz/week     Comment: socially  . Drug use: No  . Sexual activity: Not on file   Other Topics Concern  . Not on file   Social History Narrative  . No narrative on file    ROS Constitutional: Denies fever, chills,  weight loss/gain, headaches, insomnia,  night sweats or change in appetite. Does c/o fatigue. Eyes: Denies redness, blurred vision, diplopia, discharge, itchy or watery eyes.  ENT: Denies discharge, congestion, post nasal drip, epistaxis, sore throat, earache, hearing loss, dental pain, Tinnitus, Vertigo, Sinus pain or snoring.  Cardio: Denies chest pain, palpitations, irregular heartbeat, syncope, dyspnea, diaphoresis, orthopnea, PND, claudication or edema Respiratory: denies cough, dyspnea, DOE, pleurisy, hoarseness, laryngitis or wheezing.  Gastrointestinal:  Denies dysphagia, heartburn, reflux, water brash, pain, cramps, nausea, vomiting, bloating, diarrhea, constipation, hematemesis, melena, hematochezia, jaundice or hemorrhoids Genitourinary: Denies dysuria, frequency, urgency, nocturia, hesitancy, discharge, hematuria or flank pain Musculoskeletal: Denies arthralgia, myalgia, stiffness, Jt. Swelling, pain, limp or strain/sprain. Denies Falls. Skin: Denies puritis, rash, hives, warts, acne, eczema or change in skin lesion Neuro: No weakness, tremor, incoordination, spasms, paresthesia or pain Psychiatric: Denies confusion, memory loss or sensory loss. Denies Depression. Endocrine: Denies change in weight, skin, hair change, nocturia, and paresthesia, diabetic polys, visual blurring or hyper / hypo glycemic episodes.  Heme/Lymph: No excessive bleeding, bruising or enlarged lymph nodes.  Physical Exam  There were no vitals taken for this visit.  General Appearance: Well nourished, in no apparent distress.  Eyes: PERRLA, EOMs, conjunctiva no swelling or erythema, normal fundi and vessels. Sinuses: No frontal/maxillary tenderness ENT/Mouth: EACs patent / TMs  nl. Nares clear without erythema, swelling, mucoid exudates. Oral hygiene is good. No erythema, swelling, or exudate. Tongue normal, non-obstructing. Tonsils not swollen or erythematous. Hearing normal.  Neck: Supple, thyroid normal. No  bruits, nodes or JVD. Respiratory: Respiratory effort normal.  BS equal and clear bilateral without rales, rhonci, wheezing or stridor. Cardio: Heart sounds are normal with regular rate and rhythm and no murmurs, rubs or gallops. Peripheral pulses are normal and equal bilaterally without edema. No aortic or femoral bruits. Chest: symmetric with normal excursions and percussion.  Abdomen: Soft, with Nl bowel sounds. Nontender, no guarding, rebound, hernias, masses, or organomegaly.  Lymphatics: Non tender without lymphadenopathy.  Genitourinary: No hernias.Testes nl. DRE - prostate nl for age - smooth & firm w/o nodules. Musculoskeletal: Full ROM all peripheral extremities, joint stability, 5/5 strength, and normal gait. Skin: Warm and dry without rashes, lesions, cyanosis, clubbing or  ecchymosis.  Neuro: Cranial nerves intact, reflexes equal bilaterally. Normal muscle tone, no cerebellar symptoms. Sensation intact.  Pysch: Alert and oriented X 3 with normal affect, insight and judgment appropriate.   Assessment and Plan  1. Annual Preventative/Screening Exam  2. Hypertension  3. Hyperlipidemia 4. Pre Diabetes 5. Vitamin D Deficiency       Continue prudent diet as discussed, weight control, BP monitoring, regular exercise, and medications as discussed.  Discussed med effects and SE's. Routine screening labs and tests as requested with regular follow-up as recommended. Over 40 minutes of exam, counseling, chart review and high complex critical decision making was performed

## 2015-12-31 NOTE — Progress Notes (Signed)
[facility]   [name], M.D.    [name]. Silverio Lay, P.A.-C      Starlyn Skeans, P.A.-C  [facility]                [address] [place], [place] [address] Telephone [phone] Telefax [phone] Annual  Screening/Preventative Visit  & Comprehensive Evaluation & Examination     This very nice 80 y.o. single WM presents for a Screening/Preventative Visit & comprehensive evaluation and management of multiple medical co-morbidities.  Patient has been followed for HTN, Prediabetes, Hyperlipidemia and Vitamin D Deficiency.      Patient had RUL lobectomy by [facility] in 2008 for Primary Lung Cancer and has been followed by Dr Julien Nordmann. Patient's COPD has been stable in his limited sedentary lifestyle w/o c/o DOE, cough or sputum pdn. Also in 2013 he had TURP & TUBR for Bladder and Prostate Cancer and is followed by Dr Nira Conn.         HTN predates circa 1998. Patient's BP has been controlled at home.  Today's BP is at goal - 132/60. Patient denies any cardiac symptoms as chest pain, palpitations, shortness of breath, dizziness or ankle swelling.     Patient's hyperlipidemia is controlled with diet and medications. Patient denies myalgias or other medication SE's. Last lipids were at goal: Lab Results  Component Value Date   CHOL 117 11/02/2015   HDL 36 (L) 11/02/2015   LDLCALC 68 11/02/2015   TRIG 67 11/02/2015   CHOLHDL 3.3 11/02/2015      Patient has prediabetes since 2011 with A1c 6.2% and then with prudent diet dropping to 5.3% in  2012. Patient denies reactive hypoglycemic symptoms, visual blurring, diabetic polys or paresthesias. Last A1c was still at goal: Lab Results  Component Value Date   HGBA1C 5.4 11/01/2015       Finally, patient has history of Vitamin D Deficiency in 2008 of "30" and last vitamin D was at goal: Lab Results  Component Value Date   VD25OH 63 06/04/2015   Current  Outpatient Prescriptions on File Prior to Visit  Medication Sig  . albuterol  HFA inhaler Inhale 1-2 puffs  every 6  hrs as needed   . aspirin 81 MG tablet Take daily.  . Cetirizine 10 MG tablet Take 1 tab daily  . VITAMIN D 2000 UNITS - Take 8,000 Units  daily.   . Multi-Vit w/ MIN Take 1 tab every evening.   Marland Kitchen NITROSTAT 0.4 MG SL tablet as needed for chest pain.  . Omega-3 FISH OIL 1000 MG  Take 1 cap very evening.   . pravastatin  40 MG tablet Take 1 tab at bedtime.  Marland Kitchen terazosin10 MG capsule TAKE 1 CAP DAILY  . vitamin C  500 MG tablet Take daily.   Allergies  Allergen Reactions  . Nitrofuran Derivatives Shortness Of Breath    macrobid   Past Medical History:  Diagnosis Date  . Allergy    seasonal  . Bladder tumor   . BPH (benign prostatic hypertrophy)   . Cancer ([place])    bladder,, lung  . COPD (chronic obstructive pulmonary disease) ([place])   . History of kidney stones   . History of lung cancer 2008  S/P RIGHT UPPER LOPECTOMY    STAGE I  NON-SMALL CELL LUNG CANCER--  ONCOLOGIST- DR MOHAMED-   NO RECURRENCE  . Hyperlipidemia   .  Hypertension   . Impaired hearing bilateral aids  . Irregular heart rate    per pt heart will skip a beat   . Unspecified vitamin D deficiency    Health Maintenance  Topic Date Due  . COLONOSCOPY  01/25/2018  . TETANUS/TDAP  03/09/2020  . INFLUENZA VACCINE  Completed  . ZOSTAVAX  Completed  . PNA vac Low Risk Adult  Completed   Immunization History  Administered Date(s) Administered  . Influenza, High Dose Seasonal PF 11/03/2014  . Influenza,inj,Quad PF,36+ Mos 11/09/2015  . Influenza-Unspecified 11/06/2012  . Pneumococcal Conjugate-13 01/20/2014  . Pneumococcal-Unspecified 02/06/2002, 09/23/2012  . Tdap 03/09/2010  . Zoster 08/03/2014   Past Surgical History:  Procedure Laterality Date  . COLONOSCOPY  05-09-2004   HPP and tics Kaplan   . FIBEROPTIC BRONCHOSCOPY  10-28-2008  . LITHOTRIPSY  2012  . LUNG REMOVAL, PARTIAL  2008  .  PROSTATE SURGERY     prostatectomy  . RIGHT UPPER LUNG LOBECTOMY  09-21-2006  DR BURNEY   NON-SMALL CELL CANCER/ SEVERE COPD  . TRANSURETHRAL RESECTION OF BLADDER TUMOR  12/25/2011   Procedure: TRANSURETHRAL RESECTION OF BLADDER TUMOR (TURBT);  Surgeon: Franchot Gallo, MD;  Location: [facility];  Service: Urology;  Laterality: N/A;  . TRANSURETHRAL RESECTION OF PROSTATE  12/25/2011   Procedure: TRANSURETHRAL RESECTION OF THE PROSTATE WITH GYRUS INSTRUMENTS;  Surgeon: Franchot Gallo, MD;  Location: [facility];  Service: Urology;  Laterality: N/A;  2 HRS    Family History  Problem Relation Age of Onset  . Cancer Mother     Non-Hodgkin's Lymphoma  . Diabetes Mother   . Hypertension Mother   . Colon polyps Mother   . Cancer Father     Colon cancer w/ metastasis to lung & bone  . Colon cancer Father   . Cancer Brother 53    Lung cancer  . Diabetes Maternal Grandmother   . Heart failure Maternal Grandmother   . Cancer Brother 65    Non-small cell Lung cancer  . Pneumonia Maternal Grandfather   . Esophageal cancer Neg Hx   . Rectal cancer Neg Hx   . Stomach cancer Neg Hx    Social History   Social History  . Marital status: Single    Spouse name: N/A  . Number of children: N/A  . Years of education: N/A   Occupational History  . 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 Smoking status: Former Smoker    Packs/day: 1.00    Years: 35.00    Types: Cigarettes    Quit date: 02/07/2004  . Smokeless tobacco: Never Used  . Alcohol use 0.0 oz/week     Comment: socially  . Drug use: No  . Sexual activity: Not on file    ROS Constitutional: Denies fever, chills, weight loss/gain, headaches, insomnia,  night sweats or change in appetite. Does c/o fatigue. Eyes: Denies redness, blurred vision, diplopia, discharge, itchy or watery eyes.  ENT: Denies discharge, congestion, post nasal drip, epistaxis, sore throat,  earache, hearing loss, dental pain, Tinnitus, Vertigo, Sinus pain or snoring.  Cardio: Denies chest pain, palpitations, irregular heartbeat, syncope, dyspnea, diaphoresis, orthopnea, PND, claudication or edema Respiratory: denies cough, dyspnea, DOE, pleurisy, hoarseness, laryngitis or wheezing.  Gastrointestinal: Denies dysphagia, heartburn, reflux, water brash, pain, cramps, nausea, vomiting, bloating, diarrhea, constipation, hematemesis, melena, hematochezia, jaundice or hemorrhoids Genitourinary: Denies dysuria, frequency, urgency, nocturia, hesitancy, discharge, hematuria or flank pain Musculoskeletal: Denies arthralgia, myalgia, stiffness,  Jt. Swelling, pain, limp or strain/sprain. Denies Falls. Skin: Denies puritis, rash, hives, warts, acne, eczema or change in skin lesion Neuro: No weakness, tremor, incoordination, spasms, paresthesia or pain Psychiatric: Denies confusion, memory loss or sensory loss. Denies Depression. Endocrine: Denies change in weight, skin, hair change, nocturia, and paresthesia, diabetic polys, visual blurring or hyper / hypo glycemic episodes.  Heme/Lymph: No excessive bleeding, bruising or enlarged lymph nodes.  Physical Exam  BP 132/60   Pulse 68   Temp 97.5 F (36.4 C)   Resp 16   Ht 5' 3.25" (1.607 m)   Wt 165 lb (74.8 kg)   BMI 29.00 kg/m   General Appearance: Well nourished, in no apparent distress.  Eyes: PERRLA, EOMs, conjunctiva no swelling or erythema, normal fundi and vessels. Sinuses: No frontal/maxillary tenderness ENT/Mouth: EACs patent / TMs  nl. Nares clear without erythema, swelling, mucoid exudates. Oral hygiene is good. No erythema, swelling, or exudate. Tongue normal, non-obstructing. Tonsils not swollen or erythematous. Hearing normal.  Neck: Supple, thyroid normal. No bruits, nodes or JVD. Respiratory: Respiratory effort normal.  BS equal and clear bilateral without rales, rhonci, wheezing or stridor. Cardio: Heart sounds are normal  with regular rate and rhythm and no murmurs, rubs or gallops. Peripheral pulses are normal and equal bilaterally without edema. No aortic or femoral bruits. Chest: symmetric with normal excursions and percussion.  Abdomen: Soft, with Nl bowel sounds. Nontender, no guarding, rebound, hernias, masses, or organomegaly.  Lymphatics: Non tender without lymphadenopathy.  Genitourinary: No hernias.Testes nl. DRE - prostate nl for age - smooth & firm w/o nodules. Musculoskeletal: Full ROM all peripheral extremities, joint stability, 5/5 strength, and normal gait. Skin: Warm and dry without rashes, lesions, cyanosis, clubbing or  ecchymosis.  Neuro: Cranial nerves intact, reflexes equal bilaterally. Normal muscle tone, no cerebellar symptoms. Sensation intact.  Pysch: Alert and oriented X 3 with normal affect, insight and judgment appropriate.   Assessment and Plan  1. Annual Preventative/Screening Exam    2. Essential hypertension  - Microalbumin / creatinine urine ratio - EKG 12-Lead - [place], RETROPERITNL ABD,  LTD - TSH  3. Hyperlipidemia  - Lipid panel - TSH  4. Prediabetes  - Hemoglobin A1c - Insulin, random  5. Vitamin D deficiency  - VITAMIN D 25 Hydroxy   6. Screening for rectal cancer  - POC Hemoccult Bld/Stl   7. Prostate cancer screening  - PSA  8. History of lung cancer   9. Chronic obstructive pulmonary disease ([place])   10. Screening for ischemic heart disease   11. Screening for AAA (aortic abdominal aneurysm)   12. Medication management  - Urinalysis, Routine w reflex microscopic  - CBC with Differential/Platelet - BASIC METABOLIC PANEL WITH GFR - Hepatic function panel - Magnesium - Lipid panel       Continue prudent diet as discussed, weight control, BP monitoring, regular exercise, and medications as discussed.  Discussed med effects and SE's. Routine screening labs and tests as requested with regular follow-up as recommended. Over 40 minutes of  exam, counseling, chart review and high complex critical decision making was performed

## 2016-01-03 ENCOUNTER — Encounter: Payer: Self-pay | Admitting: Internal Medicine

## 2016-01-03 ENCOUNTER — Ambulatory Visit (INDEPENDENT_AMBULATORY_CARE_PROVIDER_SITE_OTHER): Payer: Self-pay | Admitting: Internal Medicine

## 2016-01-03 VITALS — BP 132/60 | HR 68 | Temp 97.5°F | Resp 16 | Ht 63.25 in | Wt 165.0 lb

## 2016-01-03 DIAGNOSIS — N320 Bladder-neck obstruction: Secondary | ICD-10-CM | POA: Diagnosis not present

## 2016-01-03 DIAGNOSIS — E559 Vitamin D deficiency, unspecified: Secondary | ICD-10-CM

## 2016-01-03 DIAGNOSIS — J449 Chronic obstructive pulmonary disease, unspecified: Secondary | ICD-10-CM

## 2016-01-03 DIAGNOSIS — Z136 Encounter for screening for cardiovascular disorders: Secondary | ICD-10-CM | POA: Diagnosis not present

## 2016-01-03 DIAGNOSIS — Z79899 Other long term (current) drug therapy: Secondary | ICD-10-CM

## 2016-01-03 DIAGNOSIS — Z1212 Encounter for screening for malignant neoplasm of rectum: Secondary | ICD-10-CM

## 2016-01-03 DIAGNOSIS — Z125 Encounter for screening for malignant neoplasm of prostate: Secondary | ICD-10-CM

## 2016-01-03 DIAGNOSIS — E782 Mixed hyperlipidemia: Secondary | ICD-10-CM | POA: Diagnosis not present

## 2016-01-03 DIAGNOSIS — I10 Essential (primary) hypertension: Secondary | ICD-10-CM

## 2016-01-03 DIAGNOSIS — R6889 Other general symptoms and signs: Secondary | ICD-10-CM

## 2016-01-03 DIAGNOSIS — R7303 Prediabetes: Secondary | ICD-10-CM | POA: Diagnosis not present

## 2016-01-03 DIAGNOSIS — Z0001 Encounter for general adult medical examination with abnormal findings: Secondary | ICD-10-CM

## 2016-01-03 DIAGNOSIS — Z85118 Personal history of other malignant neoplasm of bronchus and lung: Secondary | ICD-10-CM

## 2016-01-03 LAB — BASIC METABOLIC PANEL WITH GFR
BUN: 12 mg/dL (ref 7–25)
CHLORIDE: 107 mmol/L (ref 98–110)
CO2: 23 mmol/L (ref 20–31)
CREATININE: 0.94 mg/dL (ref 0.70–1.18)
Calcium: 9.5 mg/dL (ref 8.6–10.3)
GFR, Est African American: 89 mL/min (ref >=60)
GFR, Est Non African American: 77 mL/min (ref >=60)
GLUCOSE: 86 mg/dL (ref 65–99)
Potassium: 4.1 mmol/L (ref 3.5–5.3)
Sodium: 143 mmol/L (ref 135–146)

## 2016-01-03 LAB — LIPID PANEL
Cholesterol: 149 mg/dL (ref <200)
HDL: 46 mg/dL (ref >40)
LDL CALC: 87 mg/dL (ref <100)
TRIGLYCERIDES: 82 mg/dL (ref <150)
Total CHOL/HDL Ratio: 3.2 Ratio (ref <5.0)
VLDL: 16 mg/dL (ref <30)

## 2016-01-03 LAB — CBC WITH DIFFERENTIAL/PLATELET
BASOS ABS: 0 {cells}/uL (ref 0–200)
Basophils Relative: 0 %
EOS ABS: 78 {cells}/uL (ref 15–500)
Eosinophils Relative: 1 %
HEMATOCRIT: 40.6 % (ref 38.5–50.0)
Hemoglobin: 13.8 g/dL (ref 13.2–17.1)
Lymphocytes Relative: 32 %
Lymphs Abs: 2496 cells/uL (ref 850–3900)
MCH: 31.5 pg (ref 27.0–33.0)
MCHC: 34 g/dL (ref 32.0–36.0)
MCV: 92.7 fL (ref 80.0–100.0)
MONO ABS: 390 {cells}/uL (ref 200–950)
MONOS PCT: 5 %
MPV: 10.3 fL (ref 7.5–12.5)
NEUTROS ABS: 4836 {cells}/uL (ref 1500–7800)
Neutrophils Relative %: 62 %
PLATELETS: 176 10*3/uL (ref 140–400)
RBC: 4.38 MIL/uL (ref 4.20–5.80)
RDW: 13.5 % (ref 11.0–15.0)
WBC: 7.8 10*3/uL (ref 3.8–10.8)

## 2016-01-03 LAB — HEPATIC FUNCTION PANEL
ALBUMIN: 4.3 g/dL (ref 3.6–5.1)
ALK PHOS: 69 U/L (ref 40–115)
ALT: 16 U/L (ref 9–46)
AST: 21 U/L (ref 10–35)
BILIRUBIN TOTAL: 0.8 mg/dL (ref 0.2–1.2)
Bilirubin, Direct: 0.2 mg/dL (ref <=0.2)
Indirect Bilirubin: 0.6 mg/dL (ref 0.2–1.2)
TOTAL PROTEIN: 6.7 g/dL (ref 6.1–8.1)

## 2016-01-03 LAB — HEMOGLOBIN A1C
HEMOGLOBIN A1C: 5.3 % (ref <5.7)
MEAN PLASMA GLUCOSE: 105 mg/dL

## 2016-01-03 LAB — MAGNESIUM: Magnesium: 2.1 mg/dL (ref 1.5–2.5)

## 2016-01-03 LAB — TSH: TSH: 0.72 m[IU]/L (ref 0.40–4.50)

## 2016-01-04 LAB — URINALYSIS, ROUTINE W REFLEX MICROSCOPIC
BILIRUBIN URINE: NEGATIVE
GLUCOSE, UA: NEGATIVE
Leukocytes, UA: NEGATIVE
Nitrite: NEGATIVE
PH: 5.5 (ref 5.0–8.0)
PROTEIN: NEGATIVE
Specific Gravity, Urine: 1.023 (ref 1.001–1.035)

## 2016-01-04 LAB — URINALYSIS, MICROSCOPIC ONLY
BACTERIA UA: NONE SEEN [HPF]
CASTS: NONE SEEN [LPF]
CRYSTALS: NONE SEEN [HPF]
SQUAMOUS EPITHELIAL / LPF: NONE SEEN [HPF] (ref <=5)
YEAST: NONE SEEN [HPF]

## 2016-01-04 LAB — MICROALBUMIN / CREATININE URINE RATIO
Creatinine, Urine: 243 mg/dL (ref 20–370)
MICROALB UR: 2.4 mg/dL
MICROALB/CREAT RATIO: 10 ug/mg{creat} (ref <30)

## 2016-01-04 LAB — INSULIN, RANDOM: INSULIN: 7.8 u[IU]/mL (ref 2.0–19.6)

## 2016-01-04 LAB — PSA: PSA: 0.5 ng/mL (ref <=4.0)

## 2016-01-04 LAB — VITAMIN D 25 HYDROXY (VIT D DEFICIENCY, FRACTURES): Vit D, 25-Hydroxy: 90 ng/mL (ref 30–100)

## 2016-02-05 ENCOUNTER — Other Ambulatory Visit: Payer: Self-pay | Admitting: Internal Medicine

## 2016-02-09 ENCOUNTER — Other Ambulatory Visit: Payer: Self-pay

## 2016-02-09 DIAGNOSIS — Z1212 Encounter for screening for malignant neoplasm of rectum: Secondary | ICD-10-CM

## 2016-02-09 LAB — POC HEMOCCULT BLD/STL (HOME/3-CARD/SCREEN)
Card #3 Fecal Occult Blood, POC: NEGATIVE
FECAL OCCULT BLD: NEGATIVE
Fecal Occult Blood, POC: NEGATIVE

## 2016-03-27 ENCOUNTER — Other Ambulatory Visit: Payer: Self-pay | Admitting: Internal Medicine

## 2016-04-06 ENCOUNTER — Ambulatory Visit (INDEPENDENT_AMBULATORY_CARE_PROVIDER_SITE_OTHER): Payer: PPO | Admitting: Internal Medicine

## 2016-04-06 ENCOUNTER — Encounter: Payer: Self-pay | Admitting: Internal Medicine

## 2016-04-06 VITALS — BP 114/62 | HR 68 | Temp 98.2°F | Resp 14 | Ht 63.25 in | Wt 168.0 lb

## 2016-04-06 DIAGNOSIS — Z0001 Encounter for general adult medical examination with abnormal findings: Secondary | ICD-10-CM

## 2016-04-06 DIAGNOSIS — Z6829 Body mass index (BMI) 29.0-29.9, adult: Secondary | ICD-10-CM | POA: Diagnosis not present

## 2016-04-06 DIAGNOSIS — Z87891 Personal history of nicotine dependence: Secondary | ICD-10-CM | POA: Diagnosis not present

## 2016-04-06 DIAGNOSIS — R7303 Prediabetes: Secondary | ICD-10-CM

## 2016-04-06 DIAGNOSIS — E559 Vitamin D deficiency, unspecified: Secondary | ICD-10-CM

## 2016-04-06 DIAGNOSIS — R6889 Other general symptoms and signs: Secondary | ICD-10-CM | POA: Diagnosis not present

## 2016-04-06 DIAGNOSIS — I10 Essential (primary) hypertension: Secondary | ICD-10-CM

## 2016-04-06 DIAGNOSIS — E782 Mixed hyperlipidemia: Secondary | ICD-10-CM | POA: Diagnosis not present

## 2016-04-06 DIAGNOSIS — N400 Enlarged prostate without lower urinary tract symptoms: Secondary | ICD-10-CM

## 2016-04-06 DIAGNOSIS — Z85118 Personal history of other malignant neoplasm of bronchus and lung: Secondary | ICD-10-CM | POA: Diagnosis not present

## 2016-04-06 DIAGNOSIS — Z0000 Encounter for general adult medical examination without abnormal findings: Secondary | ICD-10-CM

## 2016-04-06 MED ORDER — MELOXICAM 15 MG PO TABS: 15.0000 mg | ORAL_TABLET | Freq: Every day | ORAL | 0 refills | Status: DC

## 2016-04-06 NOTE — Patient Instructions (Signed)
Take meloxicam once daily with food for the next two weeks.  Then you will take the medication as needed for your hip pain.  This can be taken on days with heavier activity or on days where you are going to be sitting for a long time.

## 2016-04-06 NOTE — Progress Notes (Signed)
MEDICARE ANNUAL WELLNESS VISIT AND FOLLOW UP Assessment:   1. Essential hypertension -not currently on medications -dash diet -exercise as tolerated -monitor at home  2. Benign prostatic hyperplasia without lower urinary tract symptoms -not currently on medications -symptoms currently stable -not interested in further medication  3. BMI 29.0-29.9,adult -cont diet and exercise  4. History of lung cancer -s/p lobectomy  5. History of smoking -no further smoking  6. Hyperlipidemia -cont pravastatin -check lipid panel twice yearly  7. Prediabetes -cont diet and exercise -check A1C twice yearly  8. Vitamin D deficiency -cont Vit D supplement  9. Medicare annual wellness visit, subsequent -due next year    Over 30 minutes of exam, counseling, chart review, and critical decision making was performed  Future Appointments Date Time Provider [place]  07/12/2016 9:30 AM [name], MD [facility] None  02/07/2017 2:00 PM [name], MD [facility] None     Plan:   During the course of the visit the patient was educated and counseled about appropriate screening and preventive services including:    Pneumococcal vaccine   Influenza vaccine  Prevnar 13  Td vaccine  Screening electrocardiogram  Colorectal cancer screening  Diabetes screening  Glaucoma screening  Nutrition counseling    Subjective:  Raymond Patrick is a 81 y.o. male who presents for Medicare Annual Wellness Visit and 3 month follow up for HTN, hyperlipidemia, prediabetes, and vitamin D Def.   His blood pressure has been controlled at home, today their BP is BP: 114/62 He does workout. He denies chest pain, shortness of breath, dizziness.    He is on cholesterol medication and denies myalgias. His cholesterol is at goal. The cholesterol last visit was:   Lab Results  Component Value Date   CHOL 149 01/03/2016   HDL 46 01/03/2016   LDLCALC 87 01/03/2016   TRIG 82  01/03/2016   CHOLHDL 3.2 01/03/2016  He does well with the pravastatin.  He has no muscle aches or cramps that wakes him up.    He does sometimes have hand and feet cramps.    He is working out at [employer] twice weekly and he has also been doing some walking.  He was up to 10,000.   Marland Kitchen Last A1C in the office was:  Lab Results  Component Value Date   HGBA1C 5.3 01/03/2016   Last GFR Lab Results  Component Value Date   GFRNONAA 77 01/03/2016     Lab Results  Component Value Date   GFRAA 89 01/03/2016   Patient is on Vitamin D supplement.   Lab Results  Component Value Date   VD25OH 90 01/03/2016     He notes that his left hip has been really bothersome for the last several months.  He reports aching pain that is constant.  The pain is worse with excessive movements.  There is a stiffness that improves with some movement.  He takes an aspirin if it is really bothersome.     Medication Review: Current Outpatient Prescriptions on File Prior to Visit  Medication Sig Dispense Refill  . albuterol (PROVENTIL HFA;VENTOLIN HFA) 108 (90 Base) MCG/ACT inhaler Inhale 1-2 puffs into the lungs every 6 (six) hours as needed for wheezing or shortness of breath. 18 g 2  . aspirin 81 MG tablet Take 81 mg by mouth daily.    . cetirizine (ZYRTEC) 10 MG tablet Take 1 tablet (10 mg total) by mouth daily. One tab daily for allergies 30 tablet 3  .  Cholecalciferol (VITAMIN D) 2000 UNITS CAPS Take 8,000 Units by mouth daily.     . Multiple Vitamin (MULTIVITAMIN WITH MINERALS) TABS Take 1 tablet by mouth every evening.     . nitroGLYCERIN (NITROSTAT) 0.4 MG SL tablet Place 1 tablet (0.4 mg total) under the tongue every 5 (five) minutes x 3 doses as needed for chest pain. 25 tablet 2  . Omega-3 Fatty Acids (FISH OIL) 1000 MG CAPS Take 1 capsule by mouth every evening.     Marland Kitchen OVER THE COUNTER MEDICATION Reported on 01/26/2015    . pravastatin (PRAVACHOL) 40 MG tablet TAKE 1 TABLET(40 MG) BY MOUTH AT BEDTIME  90 tablet 1  . terazosin (HYTRIN) 10 MG capsule TAKE 1 CAPSULE(10 MG) BY MOUTH DAILY 90 capsule 1  . vitamin C (ASCORBIC ACID) 500 MG tablet Take 500 mg by mouth daily.     No current facility-administered medications on file prior to visit.     Allergies: Allergies  Allergen Reactions  . Nitrofuran Derivatives Shortness Of Breath    macrobid    Current Problems (verified) has Hyperlipidemia; History of smoking; Hypertension; Vitamin D deficiency; Prediabetes; History of lung cancer; BMI 29.0-29.9,adult; Medicare annual wellness visit, subsequent; and BPH (benign prostatic hyperplasia) on his problem list.  Screening Tests Immunization History  Administered Date(s) Administered  . Influenza, High Dose Seasonal PF 11/03/2014  . Influenza,inj,Quad PF,36+ Mos 11/09/2015  . Influenza-Unspecified 11/06/2012  . Pneumococcal Conjugate-13 01/20/2014  . Pneumococcal-Unspecified 02/06/2002, 09/23/2012  . Tdap 03/09/2010  . Zoster 08/03/2014    Preventative care: Last colonoscopy: 2017  Names of Other Physician/Practitioners you currently use: 1. [facility] here for primary care 2. Dr. Delman Cheadle, eye doctor, last visit 2017  3. Dr. Derenda Mis , dentist, last visit 2017 Patient Care Team: [name], MD as PCP - General (Internal Medicine) Franchot Gallo, MD as Consulting Physician (Urology) Curt Bears, MD as Consulting Physician (Oncology) Lorretta Harp, MD as Consulting Physician (Cardiology) Inda Castle, MD as Consulting Physician (Gastroenterology) Melissa Noon, OD as Referring Physician (Optometry)  Surgical: He  has a past surgical history that includes Lung removal, partial (2008); Lithotripsy (2012); Fiberoptic bronchoscopy (10-28-2008); RIGHT UPPER LUNG LOBECTOMY (09-21-2006  DR Arlyce Dice); Transurethral resection of prostate (12/25/2011); Transurethral resection of bladder tumor (12/25/2011); Prostate surgery; and Colonoscopy  (05-09-2004). Family His family history includes Cancer in his father and mother; Cancer (age of onset: 51) in his brother; Cancer (age of onset: 83) in his brother; Colon cancer in his father; Colon polyps in his mother; Diabetes in his maternal grandmother and mother; Heart failure in his maternal grandmother; Hypertension in his mother; Pneumonia in his maternal grandfather. Social history  He reports that he quit smoking about 12 years ago. His smoking use included Cigarettes. He has a 35.00 pack-year smoking history. He has never used smokeless tobacco. He reports that he drinks alcohol. He reports that he does not use drugs.  MEDICARE WELLNESS OBJECTIVES: Physical activity:   Cardiac risk factors:   Depression/mood screen:   Depression screen [facility] 2/9 04/06/2016  Decreased Interest 0  Down, Depressed, Hopeless 0  PHQ - 2 Score 0    ADLs:  In your present state of health, do you have any difficulty performing the following activities: 04/06/2016 01/03/2016  Hearing? N Y  Vision? N N  Difficulty concentrating or making decisions? N N  Walking or climbing stairs? N N  Dressing or bathing? N N  Doing errands, shopping? N [employer]  and eating ? N -  Using the Toilet? N -  In the past six months, have you accidently leaked urine? N -  Do you have problems with loss of bowel control? N -  Managing your Medications? N -  Managing your Finances? N -  Housekeeping or managing your Housekeeping? N -  Some recent data might be hidden     Cognitive Testing  Alert? Yes  Normal Appearance?Yes  Oriented to person? Yes  Place? Yes   Time? Yes  Recall of three objects?  Yes  Can perform simple calculations? Yes  Displays appropriate judgment?Yes  Can read the correct time from a watch face?Yes  EOL planning: Does Patient Have a Medical Advance Directive?: Yes Type of Advance Directive: Healthcare Power of Attorney, Living will Copy of [place] in Chart?:  Yes   Objective:   Today's Vitals   04/06/16 0946  BP: 114/62  Pulse: 68  Resp: 14  Temp: 98.2 F (36.8 C)  TempSrc: Temporal  Weight: 168 lb (76.2 kg)  Height: 5' 3.25" (1.607 m)   Body mass index is 29.53 kg/m.  General appearance: alert, no distress, WD/WN, male HEENT: normocephalic, sclerae anicteric, TMs pearly, nares patent, no discharge or erythema, pharynx normal Oral cavity: MMM, no lesions Neck: supple, no lymphadenopathy, no thyromegaly, no masses Heart: RRR, normal S1, S2, no murmurs Lungs: CTA bilaterally, no wheezes, rhonchi, or rales Abdomen: +bs, soft, non tender, non distended, no masses, no hepatomegaly, no splenomegaly Musculoskeletal: nontender, no swelling, no obvious deformity  Mild left hip irritabilithy with internal and external rotation Extremities: no edema, no cyanosis, no clubbing Pulses: 2+ symmetric, upper and lower extremities, normal cap refill Neurological: alert, oriented x 3, CN2-12 intact, strength normal upper extremities and lower extremities, sensation normal throughout, DTRs 2+ throughout, no cerebellar signs, gait normal Psychiatric: normal affect, behavior normal, pleasant   Medicare Attestation I have personally reviewed: The patient's medical and social history Their use of alcohol, tobacco or illicit drugs Their current medications and supplements The patient's functional ability including ADLs,fall risks, home safety risks, cognitive, and hearing and visual impairment Diet and physical activities Evidence for depression or mood disorders  The patient's weight, height, BMI, and visual acuity have been recorded in the chart.  I have made referrals, counseling, and provided education to the patient based on review of the above and I have provided the patient with a written personalized care plan for preventive services.     Starlyn Skeans, PA-C   04/06/2016

## 2016-04-26 DIAGNOSIS — H1013 Acute atopic conjunctivitis, bilateral: Secondary | ICD-10-CM | POA: Diagnosis not present

## 2016-05-03 DIAGNOSIS — H1013 Acute atopic conjunctivitis, bilateral: Secondary | ICD-10-CM | POA: Diagnosis not present

## 2016-07-12 ENCOUNTER — Ambulatory Visit (INDEPENDENT_AMBULATORY_CARE_PROVIDER_SITE_OTHER): Payer: PPO | Admitting: Internal Medicine

## 2016-07-12 ENCOUNTER — Encounter: Payer: Self-pay | Admitting: Internal Medicine

## 2016-07-12 ENCOUNTER — Other Ambulatory Visit: Payer: Self-pay | Admitting: Internal Medicine

## 2016-07-12 VITALS — BP 136/82 | HR 64 | Temp 97.8°F | Resp 16 | Ht 63.25 in | Wt 172.0 lb

## 2016-07-12 DIAGNOSIS — Z79899 Other long term (current) drug therapy: Secondary | ICD-10-CM | POA: Diagnosis not present

## 2016-07-12 DIAGNOSIS — Z85118 Personal history of other malignant neoplasm of bronchus and lung: Secondary | ICD-10-CM | POA: Diagnosis not present

## 2016-07-12 DIAGNOSIS — E782 Mixed hyperlipidemia: Secondary | ICD-10-CM | POA: Diagnosis not present

## 2016-07-12 DIAGNOSIS — R7303 Prediabetes: Secondary | ICD-10-CM

## 2016-07-12 DIAGNOSIS — E559 Vitamin D deficiency, unspecified: Secondary | ICD-10-CM

## 2016-07-12 DIAGNOSIS — M25552 Pain in left hip: Secondary | ICD-10-CM

## 2016-07-12 DIAGNOSIS — I10 Essential (primary) hypertension: Secondary | ICD-10-CM

## 2016-07-12 LAB — TSH: TSH: 1.02 mIU/L (ref 0.40–4.50)

## 2016-07-12 LAB — CBC WITH DIFFERENTIAL/PLATELET
BASOS ABS: 0 {cells}/uL (ref 0–200)
Basophils Relative: 0 %
EOS ABS: 134 {cells}/uL (ref 15–500)
Eosinophils Relative: 2 %
HCT: 36.2 % — ABNORMAL LOW (ref 38.5–50.0)
HEMOGLOBIN: 12 g/dL — AB (ref 13.2–17.1)
LYMPHS ABS: 1608 {cells}/uL (ref 850–3900)
Lymphocytes Relative: 24 %
MCH: 29.9 pg (ref 27.0–33.0)
MCHC: 33.1 g/dL (ref 32.0–36.0)
MCV: 90 fL (ref 80.0–100.0)
MONO ABS: 536 {cells}/uL (ref 200–950)
MPV: 9.1 fL (ref 7.5–12.5)
Monocytes Relative: 8 %
NEUTROS ABS: 4422 {cells}/uL (ref 1500–7800)
Neutrophils Relative %: 66 %
Platelets: 270 10*3/uL (ref 140–400)
RBC: 4.02 MIL/uL — ABNORMAL LOW (ref 4.20–5.80)
RDW: 13.3 % (ref 11.0–15.0)
WBC: 6.7 10*3/uL (ref 3.8–10.8)

## 2016-07-12 LAB — BASIC METABOLIC PANEL WITH GFR
BUN: 12 mg/dL (ref 7–25)
CALCIUM: 9.1 mg/dL (ref 8.6–10.3)
CO2: 26 mmol/L (ref 20–31)
Chloride: 108 mmol/L (ref 98–110)
Creat: 0.9 mg/dL (ref 0.70–1.18)
GFR, Est African American: >89 mL/min (ref >=60)
GFR, Est Non African American: 82 mL/min (ref >=60)
Glucose, Bld: 96 mg/dL (ref 65–99)
Potassium: 4.3 mmol/L (ref 3.5–5.3)
Sodium: 143 mmol/L (ref 135–146)

## 2016-07-12 LAB — HEPATIC FUNCTION PANEL
ALT: 15 U/L (ref 9–46)
AST: 17 U/L (ref 10–35)
Albumin: 4.2 g/dL (ref 3.6–5.1)
Alkaline Phosphatase: 73 U/L (ref 40–115)
BILIRUBIN DIRECT: 0.1 mg/dL (ref <=0.2)
BILIRUBIN INDIRECT: 0.5 mg/dL (ref 0.2–1.2)
Total Bilirubin: 0.6 mg/dL (ref 0.2–1.2)
Total Protein: 6.4 g/dL (ref 6.1–8.1)

## 2016-07-12 LAB — LIPID PANEL
CHOLESTEROL: 144 mg/dL (ref <200)
HDL: 44 mg/dL (ref >40)
LDL Cholesterol: 83 mg/dL (ref <100)
TRIGLYCERIDES: 83 mg/dL (ref <150)
Total CHOL/HDL Ratio: 3.3 Ratio (ref <5.0)
VLDL: 17 mg/dL (ref <30)

## 2016-07-12 LAB — VITAMIN D 25 HYDROXY (VIT D DEFICIENCY, FRACTURES): Vit D, 25-Hydroxy: 74 ng/mL (ref 30–100)

## 2016-07-12 LAB — MAGNESIUM: Magnesium: 2.1 mg/dL (ref 1.5–2.5)

## 2016-07-12 MED ORDER — PREDNISONE 20 MG PO TABS: ORAL_TABLET | ORAL | 0 refills | Status: DC

## 2016-07-12 NOTE — Patient Instructions (Signed)

## 2016-07-12 NOTE — Progress Notes (Signed)
This very nice 81 y.o. Single WM presents for 6 month follow up with Hypertension, Hyperlipidemia, Pre-Diabetes and Vitamin D Deficiency. Patient is s/p RUL lobectomy by [facility]ATS in 2008 for Primary Lung Cancer and has been followed by Dr Julien Nordmann. Patient also is s/p TURP for Prostate & Bladder cancer.     Patient is treated for HTN (1998)  & BP has been controlled at home. Today's BP is borderline elevated at 144/78 and rechecked at 136/82. Patient has had no complaints of any cardiac type chest pain, palpitations, dyspnea/orthopnea/PND, dizziness, claudication, or dependent edema.     Hyperlipidemia is controlled with diet & meds. Patient denies myalgias or other med SE's. Last Lipids were at goal: Lab Results  Component Value Date   CHOL 149 01/03/2016   HDL 46 01/03/2016   LDLCALC 87 01/03/2016   TRIG 82 01/03/2016   CHOLHDL 3.2 01/03/2016      Also, the patient has history of PreDiabetes (A1c 6.2% in 2011 and down to 5.3% in Apr 2017 )and has had no symptoms of reactive hypoglycemia, diabetic polys, paresthesias or visual blurring.  Last A1c was  At goal: Lab Results  Component Value Date   HGBA1C 5.3 01/03/2016      Further, the patient also has history of Vitamin D Deficiency and supplements vitamin D without any suspected side-effects. Last vitamin D was   Lab Results  Component Value Date   VD25OH 90 01/03/2016   Current Outpatient Prescriptions on File Prior to Visit  Medication Sig  . albuterol (PROVENTIL HFA;VENTOLIN HFA) 108 (90 Base) MCG/ACT inhaler Inhale 1-2 puffs into the lungs every 6 (six) hours as needed for wheezing or shortness of breath.  Marland Kitchen aspirin 81 MG tablet Take 81 mg by mouth daily.  . cetirizine (ZYRTEC) 10 MG tablet Take 1 tablet (10 mg total) by mouth daily. One tab daily for allergies  . Cholecalciferol (VITAMIN D) 2000 UNITS CAPS Take 8,000 Units by mouth daily.   . meloxicam (MOBIC) 15 MG tablet Take 1 tablet (15 mg total) by mouth daily.  . Multiple  Vitamin (MULTIVITAMIN WITH MINERALS) TABS Take 1 tablet by mouth every evening.   . nitroGLYCERIN (NITROSTAT) 0.4 MG SL tablet Place 1 tablet (0.4 mg total) under the tongue every 5 (five) minutes x 3 doses as needed for chest pain.  . Omega-3 Fatty Acids (FISH OIL) 1000 MG CAPS Take 1 capsule by mouth every evening.   Marland Kitchen OVER THE COUNTER MEDICATION Reported on 01/26/2015  . pravastatin (PRAVACHOL) 40 MG tablet TAKE 1 TABLET(40 MG) BY MOUTH AT BEDTIME  . terazosin (HYTRIN) 10 MG capsule TAKE 1 CAPSULE(10 MG) BY MOUTH DAILY  . vitamin C (ASCORBIC ACID) 500 MG tablet Take 500 mg by mouth daily.   No current facility-administered medications on file prior to visit.    Allergies  Allergen Reactions  . Nitrofuran Derivatives Shortness Of Breath    macrobid   PMHx:   Past Medical History:  Diagnosis Date  . Allergy    seasonal  . Bladder tumor   . BPH (benign prostatic hypertrophy)   . Cancer ([place])    bladder,, lung  . COPD (chronic obstructive pulmonary disease) ([place])   . History of kidney stones   . History of lung cancer 2008  S/P RIGHT UPPER LOPECTOMY    STAGE I  NON-SMALL CELL LUNG CANCER--  ONCOLOGIST- DR MOHAMED-   NO RECURRENCE  . Hyperlipidemia   . Hypertension   . Impaired hearing  bilateral aids  . Irregular heart rate    per pt heart will skip a beat   . Unspecified vitamin D deficiency    Immunization History  Administered Date(s) Administered  . Influenza, High Dose Seasonal PF 11/03/2014  . Influenza,inj,Quad PF,36+ Mos 11/09/2015  . Influenza-Unspecified 11/06/2012  . Pneumococcal Conjugate-13 01/20/2014  . Pneumococcal-Unspecified 02/06/2002, 09/23/2012  . Tdap 03/09/2010  . Zoster 08/03/2014   Past Surgical History:  Procedure Laterality Date  . COLONOSCOPY  05-09-2004   HPP and tics [place]   . FIBEROPTIC BRONCHOSCOPY  10-28-2008  . LITHOTRIPSY  2012  . LUNG REMOVAL, PARTIAL  2008  . PROSTATE SURGERY     prostatectomy  . RIGHT UPPER LUNG LOBECTOMY   09-21-2006  DR BURNEY   NON-SMALL CELL CANCER/ SEVERE COPD  . TRANSURETHRAL RESECTION OF BLADDER TUMOR  12/25/2011   Procedure: TRANSURETHRAL RESECTION OF BLADDER TUMOR (TURBT);  Surgeon: Franchot Gallo, MD;  Location: [facility];  Service: Urology;  Laterality: N/A;  . TRANSURETHRAL RESECTION OF PROSTATE  12/25/2011   Procedure: TRANSURETHRAL RESECTION OF THE PROSTATE WITH GYRUS INSTRUMENTS;  Surgeon: Franchot Gallo, MD;  Location: [facility];  Service: Urology;  Laterality: N/A;  2 HRS    FHx:    Reviewed / unchanged  SHx:    Reviewed / unchanged  Systems Review:  Constitutional: Denies fever, chills, wt changes, headaches, insomnia, fatigue, night sweats, change in appetite. Eyes: Denies redness, blurred vision, diplopia, discharge, itchy, watery eyes.  ENT: Denies discharge, congestion, post nasal drip, epistaxis, sore throat, earache, hearing loss, dental pain, tinnitus, vertigo, sinus pain, snoring.  CV: Denies chest pain, palpitations, irregular heartbeat, syncope, dyspnea, diaphoresis, orthopnea, PND, claudication or edema. Respiratory: denies cough, dyspnea, DOE, pleurisy, hoarseness, laryngitis, wheezing.  Gastrointestinal: Denies dysphagia, odynophagia, heartburn, reflux, water brash, abdominal pain or cramps, nausea, vomiting, bloating, diarrhea, constipation, hematemesis, melena, hematochezia  or hemorrhoids. Genitourinary: Denies dysuria, frequency, urgency, nocturia, hesitancy, discharge, hematuria or flank pain. Musculoskeletal: Denies arthralgias, myalgias, stiffness, jt. swelling, pain, limping or strain/sprain.  Skin: Denies pruritus, rash, hives, warts, acne, eczema or change in skin lesion(s). Neuro: No weakness, tremor, incoordination, spasms, paresthesia or pain. Psychiatric: Denies confusion, memory loss or sensory loss. Endo: Denies change in weight, skin or hair change.  Heme/Lymph: No excessive bleeding, bruising or enlarged  lymph nodes.  Physical Exam  BP (!) 144/78   Pulse (!) 56   Temp 97.8 F (36.6 C)   Resp 16   Ht 5' 3.25" (1.607 m)   Wt 172 lb (78 kg)   BMI 30.23 kg/m   Appears well nourished, well groomed  and in no distress.  Eyes: PERRLA, EOMs, conjunctiva no swelling or erythema. Sinuses: No frontal/maxillary tenderness ENT/Mouth: EAC's clear, TM's nl w/o erythema, bulging. Nares clear w/o erythema, swelling, exudates. Oropharynx clear without erythema or exudates. Oral hygiene is good. Tongue normal, non obstructing. Hearing intact.  Neck: Supple. Thyroid nl. Car 2+/2+ without bruits, nodes or JVD. Chest: Respirations nl with BS clear & equal w/o rales, rhonchi, wheezing or stridor.  Cor: Heart sounds normal w/ regular rate and rhythm without sig. murmurs, gallops, clicks or rubs. Peripheral pulses normal and equal  without edema.  Abdomen: Soft & bowel sounds normal. Non-tender w/o guarding, rebound, hernias, masses or organomegaly.  Lymphatics: Unremarkable.  Musculoskeletal: Full ROM all peripheral extremities, joint stability, 5/5 strength and normal gait.  Skin: Warm, dry without exposed rashes, lesions or ecchymosis apparent.  Neuro: Cranial nerves intact, reflexes equal bilaterally. Sensory-motor  testing grossly intact. Tendon reflexes grossly intact.  Pysch: Alert & oriented x 3.  Insight and judgement nl & appropriate. No ideations.  Assessment and Plan:  1. Essential hypertension  - Continue medication, monitor blood pressure at home.  - Continue DASH diet. Reminder to go to the ER if any CP,  SOB, nausea, dizziness, severe HA, changes vision/speech,  left arm numbness and tingling and jaw pain  - BASIC METABOLIC PANEL WITH GFR - Magnesium - TSH  2. Hyperlipidemia, mixed  - Continue diet/meds, exercise,& lifestyle modifications.  - Continue monitor periodic cholesterol/liver & renal functions   - Hepatic function panel - Lipid panel - TSH  3. Prediabetes  -  Continue diet, exercise, lifestyle modifications.  - Monitor appropriate labs.  - Hemoglobin A1c  4. Vitamin D deficiency  - Continue supplementation.                                                                                                                                                                                                                                                                                                                                                                                                                                                                                                        -  VITAMIN D 25 Hydroxy   5. History of lung cancer   6. Left hip pain  - predniSONE 20 MG tablet; 1 tab 3 x day for 3 days, then 1 tab 2 x day for 3 days, then 1 tab 1 x day for 5 days  Dispense: 20 tablet; Refill: 0  7. Medication management  - BASIC METABOLIC PANEL WITH GFR - Hepatic function panel - Magnesium - Lipid panel - TSH - Hemoglobin A1c - Insulin, random        Discussed  regular exercise, BP monitoring, weight control to achieve/maintain BMI less than 25 and discussed med and SE's. Recommended labs to assess and monitor clinical status with further disposition pending results of labs. Over 30 minutes of exam, counseling, chart review was performed.

## 2016-07-13 LAB — HEMOGLOBIN A1C
HEMOGLOBIN A1C: 7.1 % — AB (ref <5.7)
MEAN PLASMA GLUCOSE: 157 mg/dL

## 2016-07-13 LAB — INSULIN, RANDOM: Insulin: 5.7 u[IU]/mL (ref 2.0–19.6)

## 2016-08-02 ENCOUNTER — Encounter: Payer: Self-pay | Admitting: Internal Medicine

## 2016-08-02 ENCOUNTER — Other Ambulatory Visit: Payer: Self-pay | Admitting: *Deleted

## 2016-08-02 MED ORDER — GLUCOSE BLOOD VI STRP: ORAL_STRIP | 1 refills | Status: DC

## 2016-08-02 MED ORDER — FREESTYLE LANCETS MISC: 1 refills | Status: DC

## 2016-08-02 MED ORDER — FREESTYLE SYSTEM KIT: PACK | 0 refills | Status: DC

## 2016-09-18 ENCOUNTER — Other Ambulatory Visit: Payer: Self-pay | Admitting: Internal Medicine

## 2016-11-01 NOTE — Progress Notes (Signed)
Patient ID: Raymond Patrick, male   DOB: Oct 08, 1935, 81 y.o.   MRN: [id number]  Assessment and Plan:  Hypertension:  -Continue medication,  -monitor blood pressure at home.  -Continue DASH diet.   -Reminder to go to the ER if any CP, SOB, nausea, dizziness, severe HA, changes vision/speech, left arm numbness and tingling, and jaw pain.  Cholesterol: -Continue diet and exercise.  -Check cholesterol.   Pre-diabetes: -Continue diet and exercise.  -Check A1C  Vitamin D Def: -check level -continue medications.   COPD No recent exacerbations  Continue diet and meds as discussed. Further disposition pending results of labs. Future Appointments Date Time Provider [place]  02/07/2017 2:00 PM [name], MD [facility] None    HPI 81 y.o. male  presents for 3 month follow up with hypertension, hyperlipidemia, prediabetes and vitamin D.   His blood pressure has been controlled at home, today their BP is BP: 118/72.  He does workout.  He does workout several times per week and is doing a lot of weight lifting and is also doing a lot of crunches.   He denies shortness of breath, dizziness.   He is on cholesterol medication and denies myalgias. His cholesterol is at goal. The cholesterol last visit was:   Lab Results  Component Value Date   CHOL 144 07/12/2016   HDL 44 07/12/2016   LDLCALC 83 07/12/2016   TRIG 83 07/12/2016   CHOLHDL 3.3 07/12/2016    He has been working on diet and exercise for prediabetes, just last visit he was in the preDM range, has been checking in the AM and has been 90's in AM and 120's 3 hours after food, and denies foot ulcerations, hyperglycemia, hypoglycemia , increased appetite, nausea, paresthesia of the feet, polydipsia, polyuria, visual disturbances, vomiting and weight loss. Last A1C in the office was:  Lab Results  Component Value Date   HGBA1C 7.1 (H) 07/12/2016   Patient is on Vitamin D supplement.  Lab Results  Component Value  Date   VD25OH 74 07/12/2016     BMI is Body mass index is 28.29 kg/m., he is working on diet and exercise. Wt Readings from Last 3 Encounters:  11/02/16 161 lb (73 kg)  07/12/16 172 lb (78 kg)  04/06/16 168 lb (76.2 kg)     Current Medications:  Current Outpatient Prescriptions on File Prior to Visit  Medication Sig Dispense Refill  . albuterol (PROVENTIL HFA;VENTOLIN HFA) 108 (90 Base) MCG/ACT inhaler Inhale 1-2 puffs into the lungs every 6 (six) hours as needed for wheezing or shortness of breath. 18 g 2  . aspirin 81 MG tablet Take 81 mg by mouth daily.    . cetirizine (ZYRTEC) 10 MG tablet Take 1 tablet (10 mg total) by mouth daily. One tab daily for allergies 30 tablet 3  . Cholecalciferol (VITAMIN D) 2000 UNITS CAPS Take 8,000 Units by mouth daily.     Marland Kitchen glucose blood (FREESTYLE TEST STRIPS) test strip Check blood sugar 1 time daily-DX-R73.03. 100 each 1  . glucose monitoring kit (FREESTYLE) monitoring kit Check blood sugar 1 time daily-DX-R73.03. 1 each 0  . Lancets (FREESTYLE) lancets Check blood sugar 1 time daily-DX-R73.03. 100 each 1  . meloxicam (MOBIC) 15 MG tablet Take 1 tablet (15 mg total) by mouth daily. 90 tablet 0  . Multiple Vitamin (MULTIVITAMIN WITH MINERALS) TABS Take 1 tablet by mouth every evening.     . nitroGLYCERIN (NITROSTAT) 0.4 MG SL tablet Place 1 tablet (0.4 mg  total) under the tongue every 5 (five) minutes x 3 doses as needed for chest pain. 25 tablet 2  . Omega-3 Fatty Acids (FISH OIL) 1000 MG CAPS Take 1 capsule by mouth every evening.     Marland Kitchen OVER THE COUNTER MEDICATION Reported on 01/26/2015    . pravastatin (PRAVACHOL) 40 MG tablet TAKE 1 TABLET(40 MG) BY MOUTH AT BEDTIME 90 tablet 1  . terazosin (HYTRIN) 10 MG capsule TAKE 1 CAPSULE(10 MG) BY MOUTH DAILY 90 capsule 0  . vitamin C (ASCORBIC ACID) 500 MG tablet Take 500 mg by mouth daily.     No current facility-administered medications on file prior to visit.     Medical History:  Past Medical  History:  Diagnosis Date  . Allergy    seasonal  . Bladder tumor   . BPH (benign prostatic hypertrophy)   . Cancer ([place])    bladder,, lung  . COPD (chronic obstructive pulmonary disease) ([place])   . History of kidney stones   . History of lung cancer 2008  S/P RIGHT UPPER LOPECTOMY    STAGE I  NON-SMALL CELL LUNG CANCER--  ONCOLOGIST- DR MOHAMED-   NO RECURRENCE  . Hyperlipidemia   . Hypertension   . Impaired hearing bilateral aids  . Irregular heart rate    per pt heart will skip a beat   . Unspecified vitamin D deficiency     Allergies:  Allergies  Allergen Reactions  . Nitrofuran Derivatives Shortness Of Breath    macrobid     Review of Systems:  Review of Systems  Constitutional: Negative for chills, fever and malaise/fatigue.  HENT: Negative for congestion, ear pain and sore throat.   Eyes: Negative for blurred vision and discharge.  Respiratory: Negative for cough, shortness of breath and wheezing.   Cardiovascular: Positive for chest pain. Negative for palpitations and leg swelling.  Gastrointestinal: Positive for constipation. Negative for blood in stool, diarrhea, heartburn, melena, nausea and vomiting.  Genitourinary: Negative.   Skin: Negative.   Neurological: Negative for dizziness, sensory change and headaches.  Psychiatric/Behavioral: Negative for depression. The patient is not nervous/anxious and does not have insomnia.     Family history- Review and unchanged  Social history- Review and unchanged  Physical Exam: BP 118/72   Pulse 70   Temp 97.9 F (36.6 C)   Resp 14   Ht 5' 3.25" (1.607 m)   Wt 161 lb (73 kg)   SpO2 93%   BMI 28.29 kg/m  Wt Readings from Last 3 Encounters:  11/02/16 161 lb (73 kg)  07/12/16 172 lb (78 kg)  04/06/16 168 lb (76.2 kg)    General Appearance: Well nourished well developed, in no apparent distress. Eyes: PERRLA, EOMs, conjunctiva no swelling or erythema ENT/Mouth: Ear canals normal without obstruction, swelling,  erythma, discharge.  TMs normal bilaterally.  Oropharynx moist, clear, without exudate, or postoropharyngeal swelling. Neck: Supple, thyroid normal,no cervical adenopathy  Respiratory: Respiratory effort normal, Breath sounds clear A&P with mild wheezing without rhonchi, or rale.  No retractions, no accessory usage. Non tender chest wall.  Cardio: RRR with no MRGs. Brisk peripheral pulses without edema.  Abdomen: Soft, + BS,  Non tender, no guarding, rebound, hernias, masses. Musculoskeletal: Full ROM, 5/5 strength, Normal gait Skin: Warm, dry without rashes, lesions, ecchymosis.  Neuro: Awake and oriented X 3, Cranial nerves intact. Normal muscle tone, no cerebellar symptoms. Psych: Normal affect, Insight and Judgment appropriate.    Vicie Mutters, PA-C 11:32 AM [facility]

## 2016-11-02 ENCOUNTER — Encounter: Payer: Self-pay | Admitting: Physician Assistant

## 2016-11-02 ENCOUNTER — Ambulatory Visit (INDEPENDENT_AMBULATORY_CARE_PROVIDER_SITE_OTHER): Payer: PPO | Admitting: Physician Assistant

## 2016-11-02 VITALS — BP 118/72 | HR 70 | Temp 97.9°F | Resp 14 | Ht 63.25 in | Wt 161.0 lb

## 2016-11-02 DIAGNOSIS — I10 Essential (primary) hypertension: Secondary | ICD-10-CM

## 2016-11-02 DIAGNOSIS — E559 Vitamin D deficiency, unspecified: Secondary | ICD-10-CM

## 2016-11-02 DIAGNOSIS — Z23 Encounter for immunization: Secondary | ICD-10-CM | POA: Diagnosis not present

## 2016-11-02 DIAGNOSIS — E782 Mixed hyperlipidemia: Secondary | ICD-10-CM

## 2016-11-02 DIAGNOSIS — R7303 Prediabetes: Secondary | ICD-10-CM | POA: Diagnosis not present

## 2016-11-02 DIAGNOSIS — N400 Enlarged prostate without lower urinary tract symptoms: Secondary | ICD-10-CM

## 2016-11-02 NOTE — Patient Instructions (Addendum)
Use a dropper or use a cap to put peroxide, olive oil,mineral oil or canola oil in the effected ear- 2-3 times a week. Let it soak for 20-30 min then you can take a shower or use a baby bulb with warm water to wash out the ear wax.  Do not use Qtips  Here are things you can do to help with this: - Try the Flonase or Nasonex. Remember to spray each nostril twice towards the outer part of your eye.  Do not sniff but instead pinch your nose and tilt your head back to help the medicine get into your sinuses.  The best time to do this is at bedtime.Stop if you get blurred vision or nose bleeds.   -Please pick one of the over the counter allergy medications below and take it once daily for allergies.  It will also help with fluid behind ear drums. Claritin or loratadine cheapest but likely the weakest  Zyrtec or certizine at night because it can make you sleepy The strongest is allegra or fexafinadine  Cheapest at [employer], [employer], [employer]   if worsening HA, changes vision/speech, imbalance, weakness go to the ER  Common causes of cough OR hoarseness OR sore throat:   Allergies, Viral Infections, Acid Reflux and Bacterial Infections.   Allergies and viral infections cause a cough OR sore throat by post nasal drip and are often worse at night, can also have sneezing, lower grade fevers, clear/yellow mucus. This is best treated with allergy medications or nasal sprays.  Please get on allegra for 1-2 weeks The strongest is allegra or fexafinadine  Cheapest at [employer], [employer], [employer]   Bacterial infections are more severe than allergies or viral infections with fever, teeth pain, fatigue. This can be treated with prednisone and the same over the counter medication and after 7 days can be treated with an antibiotic.   Silent reflux/GERD can cause a cough OR sore throat OR hoarseness WITHOUT heart burn because the esophagus that goes to the stomach and trachea that goes to the lungs are very close and when  you lay down the acid can irritate your throat and lungs. This can cause hoarseness, cough, and wheezing. Please stop any alcohol or anti-inflammatories like aleve/advil/ibuprofen and start an over the counter Prilosec or omeprazole 1-2 times daily 75mins before food for 2 weeks, then switch to over the counter zantac/ratinidine or pepcid/famotadine once at night for 2 weeks.    sometimes irritation causes more irritation. Try voice rest, use sugar free cough drops to prevent coughing, and try to stop clearing your throat.   If you ever have a cough that does not go away after trying these things please make a follow up visit for further evaluation or we can refer you to a specialist. Or if you ever have shortness of breath or chest pain go to the ER.     Your A1C is a measure of your sugar over the past 3 months and is not affected by what you have eaten over the past few days. Diabetes increases your chances of stroke and heart attack over 300 % and is the leading cause of blindness and kidney failure in the [place]. Please make sure you decrease bad carbs like white bread, white rice, potatoes, corn, soft drinks, pasta, cereals, refined sugars, sweet tea, dried fruits, and fruit juice. Good carbs are okay to eat in moderation like sweet potatoes, brown rice, whole grain pasta/bread, most fruit (except dried fruit) and you can  eat as many veggies as you want.   Greater than 6.5 is considered diabetic. Between 6.4 and 5.7 is prediabetic If your A1C is less than 5.7 you are NOT diabetic.  Targets for Glucose Readings: Time of Check Target for patients WITHOUT Diabetes Target for DIABETICS  Before Meals Less than 100  less than 150  Two hours after meals Less than 200  Less than 250      Bad carbs also include fruit juice, alcohol, and sweet tea. These are empty calories that do not signal to your brain that you are full.   Please remember the good carbs are still carbs which convert into  sugar. So please measure them out no more than 1/2-1 cup of rice, oatmeal, pasta, and beans  Veggies are however free foods! Pile them on.   Not all fruit is created equal. Please see the list below, the fruit at the bottom is higher in sugars than the fruit at the top. Please avoid all dried fruits.

## 2016-11-03 LAB — CBC WITH DIFFERENTIAL/PLATELET
BASOS ABS: 59 {cells}/uL (ref 0–200)
Basophils Relative: 0.8 %
EOS PCT: 1.5 %
Eosinophils Absolute: 111 cells/uL (ref 15–500)
HEMATOCRIT: 41.5 % (ref 38.5–50.0)
Hemoglobin: 14 g/dL (ref 13.2–17.1)
LYMPHS ABS: 2893 {cells}/uL (ref 850–3900)
MCH: 31.4 pg (ref 27.0–33.0)
MCHC: 33.7 g/dL (ref 32.0–36.0)
MCV: 93 fL (ref 80.0–100.0)
MONOS PCT: 6.4 %
MPV: 10.7 fL (ref 7.5–12.5)
Neutro Abs: 3863 cells/uL (ref 1500–7800)
Neutrophils Relative %: 52.2 %
PLATELETS: 187 10*3/uL (ref 140–400)
RBC: 4.46 10*6/uL (ref 4.20–5.80)
RDW: 12.1 % (ref 11.0–15.0)
Total Lymphocyte: 39.1 %
WBC mixed population: 474 cells/uL (ref 200–950)
WBC: 7.4 10*3/uL (ref 3.8–10.8)

## 2016-11-03 LAB — TSH: TSH: 1.12 m[IU]/L (ref 0.40–4.50)

## 2016-11-03 LAB — BASIC METABOLIC PANEL WITH GFR
BUN: 12 mg/dL (ref 7–25)
CHLORIDE: 107 mmol/L (ref 98–110)
CO2: 27 mmol/L (ref 20–32)
Calcium: 9.4 mg/dL (ref 8.6–10.3)
Creat: 0.89 mg/dL (ref 0.70–1.18)
GFR, EST NON AFRICAN AMERICAN: 82 mL/min/{1.73_m2} (ref >=60)
GFR, Est African American: 95 mL/min/{1.73_m2} (ref >=60)
Glucose, Bld: 98 mg/dL (ref 65–99)
POTASSIUM: 4.2 mmol/L (ref 3.5–5.3)
SODIUM: 142 mmol/L (ref 135–146)

## 2016-11-03 LAB — HEPATIC FUNCTION PANEL
AG Ratio: 1.9 (calc) (ref 1.0–2.5)
ALKALINE PHOSPHATASE (APISO): 69 U/L (ref 40–115)
ALT: 18 U/L (ref 9–46)
AST: 20 U/L (ref 10–35)
Albumin: 4.1 g/dL (ref 3.6–5.1)
BILIRUBIN DIRECT: 0.2 mg/dL (ref 0.0–0.2)
BILIRUBIN TOTAL: 1 mg/dL (ref 0.2–1.2)
Globulin: 2.2 g/dL (calc) (ref 1.9–3.7)
Indirect Bilirubin: 0.8 mg/dL (calc) (ref 0.2–1.2)
Total Protein: 6.3 g/dL (ref 6.1–8.1)

## 2016-11-03 LAB — HEMOGLOBIN A1C
EAG (MMOL/L): 5.5 (calc)
Hgb A1c MFr Bld: 5.1 % of total Hgb (ref <5.7)
Mean Plasma Glucose: 100 (calc)

## 2016-11-03 LAB — LIPID PANEL
Cholesterol: 161 mg/dL (ref <200)
HDL: 46 mg/dL (ref >40)
LDL Cholesterol (Calc): 95 mg/dL (calc)
NON-HDL CHOLESTEROL (CALC): 115 mg/dL (ref <130)
Total CHOL/HDL Ratio: 3.5 (calc) (ref <5.0)
Triglycerides: 105 mg/dL (ref <150)

## 2016-11-27 DIAGNOSIS — N400 Enlarged prostate without lower urinary tract symptoms: Secondary | ICD-10-CM | POA: Diagnosis not present

## 2016-11-27 DIAGNOSIS — N201 Calculus of ureter: Secondary | ICD-10-CM | POA: Diagnosis not present

## 2016-11-27 DIAGNOSIS — C670 Malignant neoplasm of trigone of bladder: Secondary | ICD-10-CM | POA: Diagnosis not present

## 2016-11-30 DIAGNOSIS — R8271 Bacteriuria: Secondary | ICD-10-CM | POA: Diagnosis not present

## 2016-11-30 DIAGNOSIS — N3000 Acute cystitis without hematuria: Secondary | ICD-10-CM | POA: Diagnosis not present

## 2016-12-06 DIAGNOSIS — N3000 Acute cystitis without hematuria: Secondary | ICD-10-CM | POA: Diagnosis not present

## 2016-12-15 ENCOUNTER — Other Ambulatory Visit: Payer: Self-pay | Admitting: Internal Medicine

## 2017-02-06 DIAGNOSIS — I700 Atherosclerosis of aorta: Secondary | ICD-10-CM | POA: Insufficient documentation

## 2017-02-06 NOTE — Patient Instructions (Signed)

## 2017-02-06 NOTE — Progress Notes (Signed)
[facility]   [name], M.D.     [name]. Silverio Lay, P.A.-C Liane Comber, [place]                [address] [place] [address] Telephone [phone] Telefax [phone] Annual  Screening/Preventative Visit  & Comprehensive Evaluation & Examination     This very nice 82 y.o. single WM presents for a Screening/Preventative Visit & comprehensive evaluation and management of multiple medical co-morbidities.  Patient has been followed for HTN, Prediabetes, Hyperlipidemia and Vitamin D Deficiency.         Patient is s/p RUL lobectomy by VATS  for Primary Lung Cancer in 2008 followed by Dr Julien Nordmann and also is s/p TURP for Prostate Cancer & Bladder cancer in 2013 by Dr Diona Fanti.   HTN predates since 1998. Patient's BP has been controlled at home.  Today's BP: is at goal - 116/78.  Patient has a Negative / Normal Stress Myoview in Oct 2018. Patient denies any cardiac symptoms as chest pain, palpitations, shortness of breath, dizziness or ankle swelling.     Patient's hyperlipidemia is controlled with diet and medications. Patient denies myalgias or other medication SE's. Last lipids were at goal: Lab Results  Component Value Date   CHOL 161 11/02/2016   HDL 46 11/02/2016   LDLCALC 83 07/12/2016   TRIG 105 11/02/2016   CHOLHDL 3.5 11/02/2016      Patient has prediabetes (A1c 6.2%/2011 and later at goal  5.3%/2017) and patient denies reactive hypoglycemic symptoms, visual blurring, diabetic polys or paresthesias. Last A1c was again Normal at goal: Lab Results  Component Value Date   HGBA1C 5.1 11/02/2016       Finally, patient has history of Vitamin D Deficiency ("30"/2008) and last vitamin D was at goal:  Lab Results  Component Value Date   VD25OH 74 07/12/2016   Current Outpatient Medications on File Prior to Visit  Medication Sig  . albuterol (PROVENTIL HFA;VENTOLIN HFA) 108  (90 Base) MCG/ACT inhaler Inhale 1-2 puffs into the lungs every 6 (six) hours as needed for wheezing or shortness of breath.  Marland Kitchen aspirin 81 MG tablet Take 81 mg by mouth daily.  . cetirizine (ZYRTEC) 10 MG tablet Take 1 tablet (10 mg total) by mouth daily. One tab daily for allergies  . Cholecalciferol (VITAMIN D) 2000 UNITS CAPS Take 8,000 Units by mouth daily.   Marland Kitchen glucose blood (FREESTYLE TEST STRIPS) test strip Check blood sugar 1 time daily-DX-R73.03.  . glucose monitoring kit (FREESTYLE) monitoring kit Check blood sugar 1 time daily-DX-R73.03.  Marland Kitchen Lancets (FREESTYLE) lancets Check blood sugar 1 time daily-DX-R73.03.  . meloxicam (MOBIC) 15 MG tablet Take 1 tablet (15 mg total) by mouth daily.  . Multiple Vitamin (MULTIVITAMIN WITH MINERALS) TABS Take 1 tablet by mouth every evening.   . nitroGLYCERIN (NITROSTAT) 0.4 MG SL tablet Place 1 tablet (0.4 mg total) under the tongue every 5 (five) minutes x 3 doses as needed for chest pain.  . Omega-3 Fatty Acids (FISH OIL) 1000 MG CAPS Take 1 capsule by mouth every evening.   Marland Kitchen OVER THE COUNTER MEDICATION Reported on 01/26/2015  . pravastatin (PRAVACHOL) 40 MG tablet TAKE 1 TABLET(40 MG) BY MOUTH AT BEDTIME  . terazosin (HYTRIN) 10 MG capsule TAKE 1 CAPSULE(10 MG) BY MOUTH DAILY  . vitamin C (ASCORBIC ACID) 500 MG tablet Take  500 mg by mouth daily.   No current facility-administered medications on file prior to visit.    Allergies  Allergen Reactions  . Nitrofuran Derivatives Shortness Of Breath    macrobid   Past Medical History:  Diagnosis Date  . Allergy    seasonal  . Bladder tumor   . BPH (benign prostatic hypertrophy)   . Cancer ([place])    bladder,, lung  . COPD (chronic obstructive pulmonary disease) ([place])   . History of kidney stones   . History of lung cancer 2008  S/P RIGHT UPPER LOPECTOMY    STAGE I  NON-SMALL CELL LUNG CANCER--  ONCOLOGIST- DR MOHAMED-   NO RECURRENCE  . Hyperlipidemia   . Hypertension   . Impaired hearing  bilateral aids  . Irregular heart rate    per pt heart will skip a beat   . Unspecified vitamin D deficiency    Health Maintenance  Topic Date Due  . COLONOSCOPY  01/25/2018  . TETANUS/TDAP  03/09/2020  . INFLUENZA VACCINE  Completed  . PNA vac Low Risk Adult  Completed   Immunization History  Administered Date(s) Administered  . Influenza, High Dose Seasonal PF 11/03/2014, 11/02/2016  . Influenza,inj,Quad PF,6+ Mos 11/09/2015  . Influenza-Unspecified 11/06/2012  . Pneumococcal Conjugate-13 01/20/2014  . Pneumococcal-Unspecified 02/06/2002, 09/23/2012  . Tdap 03/09/2010  . Zoster 08/03/2014   Last Colon -  Past Surgical History:  Procedure Laterality Date  . COLONOSCOPY  05-09-2004   HPP and tics [facility]   . FIBEROPTIC BRONCHOSCOPY  10-28-2008  . LITHOTRIPSY  2012  . LUNG REMOVAL, PARTIAL  2008  . PROSTATE SURGERY     prostatectomy  . RIGHT UPPER LUNG LOBECTOMY  09-21-2006  DR BURNEY   NON-SMALL CELL CANCER/ SEVERE COPD  . TRANSURETHRAL RESECTION OF BLADDER TUMOR  12/25/2011   Procedure: TRANSURETHRAL RESECTION OF BLADDER TUMOR (TURBT);  Surgeon: Franchot Gallo, MD;  Location: [facility];  Service: Urology;  Laterality: N/A;  . TRANSURETHRAL RESECTION OF PROSTATE  12/25/2011   Procedure: TRANSURETHRAL RESECTION OF THE PROSTATE WITH GYRUS INSTRUMENTS;  Surgeon: Franchot Gallo, MD;  [facility];  Service: Urology;  Laterality: N/A;  2 HRS    Family History  Problem Relation Age of Onset  . Cancer Mother        Non-Hodgkin's Lymphoma  . Diabetes Mother   . Hypertension Mother   . Colon polyps Mother   . Cancer Father        Colon cancer w/ metastasis to lung & bone  . Colon cancer Father   . Cancer Brother 14       Lung cancer  . Diabetes Maternal Grandmother   . Heart failure Maternal Grandmother   . Cancer Brother 65       Non-small cell Lung cancer  . Pneumonia Maternal Grandfather   . Esophageal cancer Neg Hx   .  Rectal cancer Neg Hx   . Stomach cancer Neg Hx    Social History   Socioeconomic History  . Marital status: Single    Spouse name: None  . Number of children: None  Occupational History  . 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 Smoking status: Former Smoker    Packs/day: 1.00    Years: 35.00    Pack years: 35.00    Types: Cigarettes    Last attempt to quit: 02/07/2004    Years since quitting: 13.0  . Smokeless tobacco: Never Used  Substance and  Sexual Activity  . Alcohol use: Yes    Alcohol/week: 0.0 oz    Comment: socially  . Drug use: No  . Sexual activity: Not on file  Other Topics Concern  . Not on file  Social History Narrative  . Not on file    ROS Constitutional: Denies fever, chills, weight loss/gain, headaches, insomnia,  night sweats or change in appetite. Does c/o fatigue. Eyes: Denies redness, blurred vision, diplopia, discharge, itchy or watery eyes.  ENT: Denies discharge, congestion, post nasal drip, epistaxis, sore throat, earache, hearing loss, dental pain, Tinnitus, Vertigo, Sinus pain or snoring.  Cardio: Denies chest pain, palpitations, irregular heartbeat, syncope, dyspnea, diaphoresis, orthopnea, PND, claudication or edema Respiratory: denies cough, dyspnea, DOE, pleurisy, hoarseness, laryngitis or wheezing.  Gastrointestinal: Denies dysphagia, heartburn, reflux, water brash, pain, cramps, nausea, vomiting, bloating, diarrhea, constipation, hematemesis, melena, hematochezia, jaundice or hemorrhoids Genitourinary: Denies dysuria, frequency, urgency, nocturia, hesitancy, discharge, hematuria or flank pain Musculoskeletal: Denies arthralgia, myalgia, stiffness, Jt. Swelling, pain, limp or strain/sprain. Denies Falls. Skin: Denies puritis, rash, hives, warts, acne, eczema or change in skin lesion Neuro: No weakness, tremor, incoordination, spasms, paresthesia or pain Psychiatric: Denies confusion, memory loss or sensory loss. Denies  Depression. Endocrine: Denies change in weight, skin, hair change, nocturia, and paresthesia, diabetic polys, visual blurring or hyper / hypo glycemic episodes.  Heme/Lymph: No excessive bleeding, bruising or enlarged lymph nodes.  Physical Exam  BP 116/78   Pulse 68   Temp 97.7 F (36.5 C)   Resp 16   Ht 5' 3.5" (1.613 m)   Wt 163 lb 6.4 oz (74.1 kg)   BMI 28.49 kg/m   General Appearance: Well nourished and well groomed and in no apparent distress.  Eyes: PERRLA, EOMs, conjunctiva no swelling or erythema, normal fundi and vessels. Sinuses: No frontal/maxillary tenderness ENT/Mouth: EACs patent / TMs  nl. Nares clear without erythema, swelling, mucoid exudates. Oral hygiene is good. No erythema, swelling, or exudate. Tongue normal, non-obstructing. Tonsils not swollen or erythematous. Hearing normal.  Neck: Supple, thyroid normal. No bruits, nodes or JVD. Respiratory: Respiratory effort normal.  BS equal and clear bilateral without rales, rhonci, wheezing or stridor. Cardio: Heart sounds are normal with regular rate and rhythm and no murmurs, rubs or gallops. Peripheral pulses are normal and equal bilaterally without edema. No aortic or femoral bruits. Chest: symmetric with normal excursions and percussion.  Abdomen: Soft, with Nl bowel sounds. Nontender, no guarding, rebound, hernias, masses, or organomegaly.  Lymphatics: Non tender without lymphadenopathy.  Genitourinary: No hernias.Testes nl. DRE - prostate nl for age - smooth & firm w/o nodules. Musculoskeletal: Full ROM all peripheral extremities, joint stability, 5/5 strength, and normal gait. Skin: Warm and dry without rashes, lesions, cyanosis, clubbing or  ecchymosis.  Neuro: Cranial nerves intact, reflexes equal bilaterally. Normal muscle tone, no cerebellar symptoms. Sensation intact.  Pysch: Alert and oriented X 3 with normal affect, insight and judgment appropriate.   Assessment and Plan  1. Annual  Preventative/Screening Exam   2. Essential hypertension  - EKG 12-Lead - [place], RETROPERITNL ABD,  LTD - Urinalysis, Routine w reflex microscopic - Microalbumin / creatinine urine ratio - CBC with Differential/Platelet - BASIC METABOLIC PANEL WITH GFR - Magnesium - TSH - DG Chest 2 View; Future  3. Hyperlipidemia, mixed  - EKG 12-Lead - [place], RETROPERITNL ABD,  LTD - Hepatic function panel - Lipid panel - TSH  4. Prediabetes  - EKG 12-Lead - [place], RETROPERITNL ABD,  LTD - Hemoglobin A1c - Insulin,  random  5. Vitamin D deficiency  - VITAMIN D 25 Hydroxy  6. Abnormal glucose  - Hemoglobin A1c - Insulin, random  7. Chronic obstructive pulmonary disease ([place])  - DG Chest 2 View; Future  8. History of prostate cancer  - PSA  9. Prostate cancer screening  - PSA  10. History of lung cancer  - DG Chest 2 View; Future  11. Screening for ischemic heart disease  - EKG 12-Lead  12. Screening for AAA (aortic abdominal aneurysm)  - [place], RETROPERITNL ABD,  LTD  13. Aortic atherosclerosis ([facility])  - [place], RETROPERITNL ABD,  LTD  14. Smoker  - EKG 12-Lead - [place], RETROPERITNL ABD,  LTD - DG Chest 2 View; Future  15. Screening for colorectal cancer  - POC Hemoccult Bld/Stl  16. Medication management  - Urinalysis, Routine w reflex microscopic - Microalbumin / creatinine urine ratio - CBC with Differential/Platelet - BASIC METABOLIC PANEL WITH GFR - Hepatic function panel - Magnesium - Lipid panel - TSH - Hemoglobin A1c - Insulin, random - VITAMIN D 25 Hydroxyl        Patient was counseled in prudent diet, weight control to achieve/maintain BMI less than 25, BP monitoring, regular exercise and medications as discussed.  Discussed med effects and SE's. Routine screening labs and tests as requested with regular follow-up as recommended. Over 40 minutes of exam, counseling, chart review and high complex critical decision making was performed

## 2017-02-07 ENCOUNTER — Ambulatory Visit (HOSPITAL_COMMUNITY)
Admission: RE | Admit: 2017-02-07 | Discharge: 2017-02-07 | Disposition: A | Discharge status: Home / Self Care | Payer: PPO | Source: Ambulatory Visit | Attending: Internal Medicine | Admitting: Internal Medicine

## 2017-02-07 ENCOUNTER — Encounter: Payer: Self-pay | Admitting: Internal Medicine

## 2017-02-07 ENCOUNTER — Ambulatory Visit (INDEPENDENT_AMBULATORY_CARE_PROVIDER_SITE_OTHER): Payer: PPO | Admitting: Internal Medicine

## 2017-02-07 VITALS — BP 116/78 | HR 68 | Temp 97.7°F | Resp 16 | Ht 63.5 in | Wt 163.4 lb

## 2017-02-07 DIAGNOSIS — R7309 Other abnormal glucose: Secondary | ICD-10-CM | POA: Diagnosis not present

## 2017-02-07 DIAGNOSIS — Z0000 Encounter for general adult medical examination without abnormal findings: Secondary | ICD-10-CM | POA: Diagnosis not present

## 2017-02-07 DIAGNOSIS — Z8546 Personal history of malignant neoplasm of prostate: Secondary | ICD-10-CM

## 2017-02-07 DIAGNOSIS — R7303 Prediabetes: Secondary | ICD-10-CM

## 2017-02-07 DIAGNOSIS — E782 Mixed hyperlipidemia: Secondary | ICD-10-CM

## 2017-02-07 DIAGNOSIS — Z136 Encounter for screening for cardiovascular disorders: Secondary | ICD-10-CM | POA: Diagnosis not present

## 2017-02-07 DIAGNOSIS — I700 Atherosclerosis of aorta: Secondary | ICD-10-CM

## 2017-02-07 DIAGNOSIS — Z125 Encounter for screening for malignant neoplasm of prostate: Secondary | ICD-10-CM

## 2017-02-07 DIAGNOSIS — Z0001 Encounter for general adult medical examination with abnormal findings: Secondary | ICD-10-CM

## 2017-02-07 DIAGNOSIS — J449 Chronic obstructive pulmonary disease, unspecified: Secondary | ICD-10-CM

## 2017-02-07 DIAGNOSIS — F17200 Nicotine dependence, unspecified, uncomplicated: Secondary | ICD-10-CM

## 2017-02-07 DIAGNOSIS — Z1211 Encounter for screening for malignant neoplasm of colon: Secondary | ICD-10-CM

## 2017-02-07 DIAGNOSIS — I10 Essential (primary) hypertension: Secondary | ICD-10-CM

## 2017-02-07 DIAGNOSIS — Z85118 Personal history of other malignant neoplasm of bronchus and lung: Secondary | ICD-10-CM | POA: Diagnosis not present

## 2017-02-07 DIAGNOSIS — Z79899 Other long term (current) drug therapy: Secondary | ICD-10-CM

## 2017-02-07 DIAGNOSIS — Z1212 Encounter for screening for malignant neoplasm of rectum: Secondary | ICD-10-CM

## 2017-02-07 DIAGNOSIS — E559 Vitamin D deficiency, unspecified: Secondary | ICD-10-CM

## 2017-02-08 LAB — BASIC METABOLIC PANEL WITH GFR
BUN: 10 mg/dL (ref 7–25)
CALCIUM: 9.8 mg/dL (ref 8.6–10.3)
CO2: 30 mmol/L (ref 20–32)
CREATININE: 0.89 mg/dL (ref 0.70–1.18)
Chloride: 105 mmol/L (ref 98–110)
GFR, EST AFRICAN AMERICAN: 94 mL/min/{1.73_m2} (ref >=60)
GFR, EST NON AFRICAN AMERICAN: 81 mL/min/{1.73_m2} (ref >=60)
Glucose, Bld: 89 mg/dL (ref 65–99)
Potassium: 4.3 mmol/L (ref 3.5–5.3)
Sodium: 142 mmol/L (ref 135–146)

## 2017-02-08 LAB — URINALYSIS, ROUTINE W REFLEX MICROSCOPIC
BILIRUBIN URINE: NEGATIVE
Bacteria, UA: NONE SEEN /HPF
GLUCOSE, UA: NEGATIVE
Hyaline Cast: NONE SEEN /LPF
KETONES UR: NEGATIVE
Nitrite: NEGATIVE
PROTEIN: NEGATIVE
Specific Gravity, Urine: 1.009 (ref 1.001–1.03)
Squamous Epithelial / LPF: NONE SEEN /HPF (ref <=5)
WBC UA: NONE SEEN /HPF (ref 0–5)
pH: 5.5 (ref 5.0–8.0)

## 2017-02-08 LAB — CBC WITH DIFFERENTIAL/PLATELET
BASOS ABS: 53 {cells}/uL (ref 0–200)
Basophils Relative: 0.7 %
EOS PCT: 1.3 %
Eosinophils Absolute: 99 cells/uL (ref 15–500)
HEMATOCRIT: 41.1 % (ref 38.5–50.0)
HEMOGLOBIN: 13.9 g/dL (ref 13.2–17.1)
LYMPHS ABS: 3146 {cells}/uL (ref 850–3900)
MCH: 30.8 pg (ref 27.0–33.0)
MCHC: 33.8 g/dL (ref 32.0–36.0)
MCV: 91.1 fL (ref 80.0–100.0)
MPV: 10.7 fL (ref 7.5–12.5)
Monocytes Relative: 5.9 %
NEUTROS ABS: 3853 {cells}/uL (ref 1500–7800)
NEUTROS PCT: 50.7 %
Platelets: 192 10*3/uL (ref 140–400)
RBC: 4.51 10*6/uL (ref 4.20–5.80)
RDW: 12.5 % (ref 11.0–15.0)
Total Lymphocyte: 41.4 %
WBC: 7.6 10*3/uL (ref 3.8–10.8)
WBCMIX: 448 {cells}/uL (ref 200–950)

## 2017-02-08 LAB — MICROALBUMIN / CREATININE URINE RATIO
Creatinine, Urine: 60 mg/dL (ref 20–320)
MICROALB/CREAT RATIO: 50 ug/mg{creat} — AB (ref <30)
Microalb, Ur: 3 mg/dL

## 2017-02-08 LAB — PSA: PSA: 2.5 ng/mL (ref <=4.0)

## 2017-02-08 LAB — VITAMIN D 25 HYDROXY (VIT D DEFICIENCY, FRACTURES): Vit D, 25-Hydroxy: 80 ng/mL (ref 30–100)

## 2017-02-08 LAB — MAGNESIUM: Magnesium: 2.2 mg/dL (ref 1.5–2.5)

## 2017-02-08 LAB — HEPATIC FUNCTION PANEL
AG RATIO: 2 (calc) (ref 1.0–2.5)
ALT: 14 U/L (ref 9–46)
AST: 20 U/L (ref 10–35)
Albumin: 4.5 g/dL (ref 3.6–5.1)
Alkaline phosphatase (APISO): 68 U/L (ref 40–115)
BILIRUBIN DIRECT: 0.2 mg/dL (ref 0.0–0.2)
BILIRUBIN TOTAL: 1 mg/dL (ref 0.2–1.2)
GLOBULIN: 2.2 g/dL (ref 1.9–3.7)
Indirect Bilirubin: 0.8 mg/dL (calc) (ref 0.2–1.2)
Total Protein: 6.7 g/dL (ref 6.1–8.1)

## 2017-02-08 LAB — HEMOGLOBIN A1C
EAG (MMOL/L): 5.8 (calc)
Hgb A1c MFr Bld: 5.3 % of total Hgb (ref <5.7)
Mean Plasma Glucose: 105 (calc)

## 2017-02-08 LAB — LIPID PANEL
CHOL/HDL RATIO: 3.1 (calc) (ref <5.0)
Cholesterol: 164 mg/dL (ref <200)
HDL: 53 mg/dL (ref >40)
LDL CHOLESTEROL (CALC): 88 mg/dL
NON-HDL CHOLESTEROL (CALC): 111 mg/dL (ref <130)
TRIGLYCERIDES: 125 mg/dL (ref <150)

## 2017-02-08 LAB — INSULIN, RANDOM: Insulin: 4.4 u[IU]/mL (ref 2.0–19.6)

## 2017-02-08 LAB — TSH: TSH: 0.98 m[IU]/L (ref 0.40–4.50)

## 2017-04-11 ENCOUNTER — Other Ambulatory Visit: Payer: Self-pay | Admitting: Internal Medicine

## 2017-05-15 ENCOUNTER — Encounter: Payer: Self-pay | Admitting: Adult Health

## 2017-05-15 NOTE — Progress Notes (Signed)
MEDICARE ANNUAL WELLNESS VISIT AND FOLLOW UP Assessment:   Diagnoses and all orders for this visit:  Encounter for Medicare annual wellness exam  Aortic atherosclerosis ([place]) Control blood pressure, cholesterol, glucose, increase exercise.   Essential hypertension Continue medications Monitor blood pressure at home; call if consistently over 130/80 Continue DASH diet.   Reminder to go to the ER if any CP, SOB, nausea, dizziness, severe HA, changes vision/speech, left arm numbness and tingling and jaw pain.  Vitamin D deficiency At goal at recent check; continue to recommend supplementation for goal of 70-100 Defer vitamin D level  Other abnormal glucose Recent A1Cs at goal Discussed diet/exercise, weight management  Defer A1C; check BMP  Hyperlipidemia Continue medications: pravastatin 40 mg  Continue low cholesterol diet and exercise.  Check lipid panel.   Overweight (BMI 25.0-29.9) Long discussion about weight loss, diet, and exercise Recommended diet heavy in fruits and veggies and low in animal meats, cheeses, and dairy products, appropriate calorie intake Discussed appropriate weight for height  Follow up at next visit  Medication management CBC, BMP/GFR, LFTs  History of smoking  History of lung cancer 10 years without remission; Released by Dr. Earlie Server  Benign prostatic hyperplasia without lower urinary tract symptoms Continue terazosyn, denies urinary symptoms, released by Dr. Beatrix Fetters    Over 30 minutes of exam, counseling, chart review, and critical decision making was performed  Future Appointments  Date Time Provider [place]  08/22/2017  9:30 AM [name], MD [facility] None  02/18/2018  2:00 PM [name], MD [facility] None     Plan:   During the course of the visit the patient was educated and counseled about appropriate screening and preventive services including:    Pneumococcal vaccine   Influenza  vaccine  Prevnar 13  Td vaccine  Screening electrocardiogram  Colorectal cancer screening  Diabetes screening  Glaucoma screening  Nutrition counseling    Subjective:  GRANTLEY SAVAGE is a 82 y.o. male who presents for Medicare Annual Wellness Visit and 3 month follow up for HTN, hyperlipidemia, glucose management, and vitamin D Def. Patient is s/p RUL lobectomy by [facility]  for Primary Lung Cancer in 2008 followed by Dr Julien Nordmann and also is s/p TURP for Prostate Cancer & Bladder cancer in 2013 by Dr Diona Fanti. He has been released by both and to follow up only if new concerns.   BMI is Body mass index is 28.6 kg/m., he has been working on diet and exercise. He is very active and aims to get 10000 steps daily.  Wt Readings from Last 3 Encounters:  05/16/17 164 lb (74.4 kg)  02/07/17 163 lb 6.4 oz (74.1 kg)  11/02/16 161 lb (73 kg)   His blood pressure has been controlled at home, today their BP is BP: 130/74 He does workout. He denies chest pain, shortness of breath, dizziness.   He is on cholesterol medication (pravastatin 40 mg every other day) and denies myalgias. His cholesterol is at goal. The cholesterol last visit was:   Lab Results  Component Value Date   CHOL 164 02/07/2017   HDL 53 02/07/2017   LDLCALC 88 02/07/2017   TRIG 125 02/07/2017   CHOLHDL 3.1 02/07/2017   He has been working on diet and exercise for glucose management, and denies foot ulcerations, increased appetite, nausea, paresthesia of the feet, polydipsia, polyuria, visual disturbances and vomiting. Last A1C in the office was:  Lab Results  Component Value Date   HGBA1C 5.3 02/07/2017   Last GFR  Lab Results  Component Value Date   GFRNONAA 81 02/07/2017    Patient is on Vitamin D supplement and at goal at recent check:   Lab Results  Component Value Date   VD25OH 80 02/07/2017      Medication Review: Current Outpatient Medications on File Prior to Visit  Medication Sig Dispense Refill  .  aspirin 81 MG tablet Take 81 mg by mouth daily.    . cetirizine (ZYRTEC) 10 MG tablet Take 1 tablet (10 mg total) by mouth daily. One tab daily for allergies 30 tablet 3  . Cholecalciferol (VITAMIN D) 2000 UNITS CAPS Take 8,000 Units by mouth daily.     Marland Kitchen glucose blood (FREESTYLE TEST STRIPS) test strip Check blood sugar 1 time daily-DX-R73.03. 100 each 1  . glucose monitoring kit (FREESTYLE) monitoring kit Check blood sugar 1 time daily-DX-R73.03. 1 each 0  . Lancets (FREESTYLE) lancets Check blood sugar 1 time daily-DX-R73.03. 100 each 1  . Multiple Vitamin (MULTIVITAMIN WITH MINERALS) TABS Take 1 tablet by mouth every evening.     . nitroGLYCERIN (NITROSTAT) 0.4 MG SL tablet Place 1 tablet (0.4 mg total) under the tongue every 5 (five) minutes x 3 doses as needed for chest pain. 25 tablet 2  . Omega-3 Fatty Acids (FISH OIL) 1000 MG CAPS Take 1 capsule by mouth every evening.     . pravastatin (PRAVACHOL) 40 MG tablet TAKE 1 TABLET(40 MG) BY MOUTH AT BEDTIME 90 tablet 1  . terazosin (HYTRIN) 10 MG capsule TAKE 1 CAPSULE(10 MG) BY MOUTH DAILY 90 capsule 1  . vitamin C (ASCORBIC ACID) 500 MG tablet Take 500 mg by mouth daily.    Marland Kitchen albuterol (PROVENTIL HFA;VENTOLIN HFA) 108 (90 Base) MCG/ACT inhaler Inhale 1-2 puffs into the lungs every 6 (six) hours as needed for wheezing or shortness of breath. (Patient not taking: Reported on 05/16/2017) 18 g 2  . meloxicam (MOBIC) 15 MG tablet Take 1 tablet (15 mg total) by mouth daily. (Patient not taking: Reported on 05/16/2017) 90 tablet 0  . OVER THE COUNTER MEDICATION Reported on 01/26/2015     No current facility-administered medications on file prior to visit.     Allergies: Allergies  Allergen Reactions  . Nitrofuran Derivatives Shortness Of Breath    macrobid    Current Problems (verified) has Hyperlipidemia; History of smoking; Hypertension; Vitamin D deficiency; Other abnormal glucose; History of lung cancer; Overweight (BMI 25.0-29.9);  Encounter for Medicare annual wellness exam; BPH (benign prostatic hyperplasia); and Aortic atherosclerosis ([place]) on their problem list.  Screening Tests Immunization History  Administered Date(s) Administered  . Influenza, High Dose Seasonal PF 11/03/2014, 11/02/2016  . Influenza,inj,Quad PF,6+ Mos 11/09/2015  . Influenza-Unspecified 11/06/2012  . Pneumococcal Conjugate-13 01/20/2014  . Pneumococcal-Unspecified 02/06/2002, 09/23/2012  . Tdap 03/09/2010  . Zoster 08/03/2014   Preventative care: Last colonoscopy: 2016 Due 01/2018 -   Prior vaccinations: TD or Tdap: 2012  Influenza: 2018  Pneumococcal: 2004, 2014 Prevnar13: 2015 Shingles/Zostavax: 2016  Names of Other Physician/Practitioners you currently use: 1. [facility] here for primary care 2. Dr. Delman Cheadle, eye doctor, last visit 2018 ? Left cataract discussion 3. Dr. Derenda Mis , dentist, last visit 2019  Patient Care Team: [name], MD as PCP - General (Internal Medicine) Franchot Gallo, MD as Consulting Physician (Urology) Curt Bears, MD as Consulting Physician (Oncology) Lorretta Harp, MD as Consulting Physician (Cardiology) Inda Castle, MD (Inactive) as Consulting Physician (Gastroenterology) Melissa Noon, [place] as Referring Physician (Optometry)  Surgical: He  has a past surgical history that includes Lung removal, partial (2008); Lithotripsy (2012); Fiberoptic bronchoscopy (10-28-2008); RIGHT UPPER LUNG LOBECTOMY (09-21-2006  DR Arlyce Dice); Transurethral resection of prostate (12/25/2011); Transurethral resection of bladder tumor (12/25/2011); Prostate surgery; and Colonoscopy (05-09-2004). Family His family history includes Cancer in his father and mother; Cancer (age of onset: 72) in his brother; Cancer (age of onset: 9) in his brother; Colon cancer in his father; Colon polyps in his mother; Diabetes in his maternal grandmother and mother; Heart failure in his  maternal grandmother; Hypertension in his mother; Pneumonia in his maternal grandfather. Social history  He reports that he quit smoking about 13 years ago. His smoking use included cigarettes. He has a 35.00 pack-year smoking history. He has never used smokeless tobacco. He reports that he drinks alcohol. He reports that he does not use drugs.  MEDICARE WELLNESS OBJECTIVES: Physical activity: Current Exercise Habits: Home exercise routine, Type of exercise: walking;strength training/weights, Time (Minutes): 60, Frequency (Times/Week): 7, Weekly Exercise (Minutes/Week): 420, Intensity: Moderate, Exercise limited by: None identified Cardiac risk factors: Cardiac Risk Factors include: advanced age (>19mn, >>55women);hypertension;male gender;dyslipidemia Depression/mood screen:   Dep[facility] 05/16/2017  Decreased Interest 0  Down, Depressed, Hopeless 0  PHQ - 2 Score 0    ADLs:  In your present state of health, do you have any difficulty performing the following activities: 05/16/2017 02/07/2017  Hearing? N N  Vision? N N  Difficulty concentrating or making decisions? N N  Walking or climbing stairs? N N  Dressing or bathing? N -  Doing errands, shopping? N N  Some recent data might be hidden     Cognitive Testing  Alert? Yes  Normal Appearance?Yes  Oriented to person? Yes  Place? Yes   Time? Yes  Recall of three objects?  Yes  Can perform simple calculations? Yes  Displays appropriate judgment?Yes  Can read the correct time from a watch face?Yes  EOL planning: Does Patient Have a Medical Advance Directive?: Yes Type of Advance Directive: Healthcare Power of Attorney, Living will Does patient want to make changes to medical advance directive?: No - Patient declined Copy of [place]in Chart?: No - copy requested   Objective:   Today's Vitals   05/16/17 0934  BP: 130/74  Pulse: 70  Temp: (!) 97.3 F (36.3 C)  SpO2: 95%  Weight: 164 lb (74.4  kg)  Height: 5' 3.5" (1.613 m)   Body mass index is 28.6 kg/m.  General appearance: alert, no distress, WD/WN, male HEENT: normocephalic, sclerae anicteric, TMs pearly, nares patent, no discharge or erythema, pharynx normal Oral cavity: MMM, no lesions Neck: supple, no lymphadenopathy, no thyromegaly, no masses Heart: RRR, normal S1, S2, no murmurs Lungs: CTA bilaterally, no wheezes, rhonchi, or rales Abdomen: +bs, soft, non tender, non distended, no masses, no hepatomegaly, no splenomegaly Musculoskeletal: nontender, no swelling, no obvious deformity Extremities: no edema, no cyanosis, no clubbing Pulses: 2+ symmetric, upper and lower extremities, normal cap refill Neurological: alert, oriented x 3, CN2-12 intact, strength normal upper extremities and lower extremities, sensation normal throughout, DTRs 2+ throughout, no cerebellar signs, gait normal Psychiatric: normal affect, behavior normal, pleasant   Medicare Attestation I have personally reviewed: The patient's medical and social history Their use of alcohol, tobacco or illicit drugs Their current medications and supplements The patient's functional ability including ADLs,fall risks, home safety risks, cognitive, and hearing and visual impairment Diet and physical activities Evidence for depression or mood disorders  The patient's weight, height, BMI, and visual acuity have been recorded in the chart.  I have made referrals, counseling, and provided education to the patient based on review of the above and I have provided the patient with a written personalized care plan for preventive services.     Izora Ribas, NP   05/16/2017

## 2017-05-15 NOTE — Progress Notes (Deleted)
FOLLOW UP  Assessment and Plan:   Hypertension Well controlled with current medications  Monitor blood pressure at home; patient to call if consistently greater than 130/80 Continue DASH diet.   Reminder to go to the ER if any CP, SOB, nausea, dizziness, severe HA, changes vision/speech, left arm numbness and tingling and jaw pain.  Cholesterol Currently at goal; continue statin Continue low cholesterol diet and exercise.  Check lipid panel.   Glucose management Recent A1Cs at goal Discussed diet/exercise, weight management  Defer A1C; check BMP  Overweight - BMI 28 *** Long discussion about weight loss, diet, and exercise Recommended diet heavy in fruits and veggies and low in animal meats, cheeses, and dairy products, appropriate calorie intake Discussed ideal weight for height and initial weight goal (***) Patient will work on *** Will follow up in 3 months  Vitamin D Def At goal at last visit; continue supplementation to maintain goal of 70-100 Defer Vit D level  Continue diet and meds as discussed. Further disposition pending results of labs. Discussed med's effects and SE's.   Over 30 minutes of exam, counseling, chart review, and critical decision making was performed.   Future Appointments  Date Time Provider [place]  05/16/2017  9:30 AM Liane Comber, NP [facility] None  08/22/2017  9:30 AM [name], MD [facility] None  02/18/2018  2:00 PM [name], MD [facility] None    ----------------------------------------------------------------------------------------------------------------------  HPI 82 y.o. male  presents for 3 month follow up on hypertension, cholesterol, glucose management, weight and vitamin D deficiency.   BMI is There is no height or weight on file to calculate BMI., he {HAS HAS EML:54492} been working on diet and exercise. Wt Readings from Last 3 Encounters:  02/07/17 163 lb 6.4 oz (74.1 kg)  11/02/16 161 lb (73 kg)   07/12/16 172 lb (78 kg)   His blood pressure {HAS HAS NOT:18834} been controlled at home, today their BP is    He {DOES_DOES EFE:07121} workout. He denies chest pain, shortness of breath, dizziness.   He is on cholesterol medication (pravastatin 40 mg ***) and denies myalgias. His cholesterol is at goal. The cholesterol last visit was:   Lab Results  Component Value Date   CHOL 164 02/07/2017   HDL 53 02/07/2017   LDLCALC 88 02/07/2017   TRIG 125 02/07/2017   CHOLHDL 3.1 02/07/2017    He {Has/has not:18111} been working on diet and exercise for glucose management (hx of A1Cs briefly in diabetic range), and denies {Symptoms; diabetes w/o none:19199}. Last A1C in the office was:  Lab Results  Component Value Date   HGBA1C 5.3 02/07/2017   Patient is on Vitamin D supplement and at goal at recent check:    Lab Results  Component Value Date   VD25OH 80 02/07/2017        Current Medications:  Current Outpatient Medications on File Prior to Visit  Medication Sig  . albuterol (PROVENTIL HFA;VENTOLIN HFA) 108 (90 Base) MCG/ACT inhaler Inhale 1-2 puffs into the lungs every 6 (six) hours as needed for wheezing or shortness of breath.  Marland Kitchen aspirin 81 MG tablet Take 81 mg by mouth daily.  . cetirizine (ZYRTEC) 10 MG tablet Take 1 tablet (10 mg total) by mouth daily. One tab daily for allergies  . Cholecalciferol (VITAMIN D) 2000 UNITS CAPS Take 8,000 Units by mouth daily.   Marland Kitchen glucose blood (FREESTYLE TEST STRIPS) test strip Check blood sugar 1 time daily-DX-R73.03.  . glucose monitoring kit (FREESTYLE) monitoring kit  Check blood sugar 1 time daily-DX-R73.03.  Marland Kitchen Lancets (FREESTYLE) lancets Check blood sugar 1 time daily-DX-R73.03.  . meloxicam (MOBIC) 15 MG tablet Take 1 tablet (15 mg total) by mouth daily.  . Multiple Vitamin (MULTIVITAMIN WITH MINERALS) TABS Take 1 tablet by mouth every evening.   . nitroGLYCERIN (NITROSTAT) 0.4 MG SL tablet Place 1 tablet (0.4 mg total) under the tongue  every 5 (five) minutes x 3 doses as needed for chest pain.  . Omega-3 Fatty Acids (FISH OIL) 1000 MG CAPS Take 1 capsule by mouth every evening.   Marland Kitchen OVER THE COUNTER MEDICATION Reported on 01/26/2015  . pravastatin (PRAVACHOL) 40 MG tablet TAKE 1 TABLET(40 MG) BY MOUTH AT BEDTIME  . terazosin (HYTRIN) 10 MG capsule TAKE 1 CAPSULE(10 MG) BY MOUTH DAILY  . vitamin C (ASCORBIC ACID) 500 MG tablet Take 500 mg by mouth daily.   No current facility-administered medications on file prior to visit.      Allergies:  Allergies  Allergen Reactions  . Nitrofuran Derivatives Shortness Of Breath    macrobid     Medical History:  Past Medical History:  Diagnosis Date  . Allergy    seasonal  . Bladder tumor   . BPH (benign prostatic hypertrophy)   . Cancer ([place])    bladder,, lung  . COPD (chronic obstructive pulmonary disease) ([place])   . History of kidney stones   . History of lung cancer 2008  S/P RIGHT UPPER LOPECTOMY    STAGE I  NON-SMALL CELL LUNG CANCER--  ONCOLOGIST- DR MOHAMED-   NO RECURRENCE  . Hyperlipidemia   . Hypertension   . Impaired hearing bilateral aids  . Irregular heart rate    per pt heart will skip a beat   . Unspecified vitamin D deficiency    Family history- Reviewed and unchanged Social history- Reviewed and unchanged   Review of Systems:  Review of Systems  Constitutional: Negative for malaise/fatigue and weight loss.  HENT: Negative for hearing loss and tinnitus.   Eyes: Negative for blurred vision and double vision.  Respiratory: Negative for cough, shortness of breath and wheezing.   Cardiovascular: Negative for chest pain, palpitations, orthopnea, claudication and leg swelling.  Gastrointestinal: Negative for abdominal pain, blood in stool, constipation, diarrhea, heartburn, melena, nausea and vomiting.  Genitourinary: Negative.   Musculoskeletal: Negative for joint pain and myalgias.  Skin: Negative for rash.  Neurological: Negative for dizziness,  tingling, sensory change, weakness and headaches.  Endo/Heme/Allergies: Negative for polydipsia.  Psychiatric/Behavioral: Negative.   All other systems reviewed and are negative.   Physical Exam: There were no vitals taken for this visit. Wt Readings from Last 3 Encounters:  02/07/17 163 lb 6.4 oz (74.1 kg)  11/02/16 161 lb (73 kg)  07/12/16 172 lb (78 kg)   General Appearance: Well nourished, in no apparent distress. Eyes: PERRLA, EOMs, conjunctiva no swelling or erythema Sinuses: No Frontal/maxillary tenderness ENT/Mouth: Ext aud canals clear, TMs without erythema, bulging. No erythema, swelling, or exudate on post pharynx.  Tonsils not swollen or erythematous. Hearing normal.  Neck: Supple, thyroid normal.  Respiratory: Respiratory effort normal, BS equal bilaterally without rales, rhonchi, wheezing or stridor.  Cardio: RRR with no MRGs. Brisk peripheral pulses without edema.  Abdomen: Soft, + BS.  Non tender, no guarding, rebound, hernias, masses. Lymphatics: Non tender without lymphadenopathy.  Musculoskeletal: Full ROM, 5/5 strength, {PSY - GAIT AND STATION:22860} gait Skin: Warm, dry without rashes, lesions, ecchymosis.  Neuro: Cranial nerves intact. No cerebellar symptoms.  Psych: Awake and oriented X 3, normal affect, Insight and Judgment appropriate.    Izora Ribas, NP 8:18 AM [facility]

## 2017-05-16 ENCOUNTER — Ambulatory Visit (INDEPENDENT_AMBULATORY_CARE_PROVIDER_SITE_OTHER): Payer: PPO | Admitting: Adult Health

## 2017-05-16 ENCOUNTER — Encounter: Payer: Self-pay | Admitting: Adult Health

## 2017-05-16 VITALS — BP 130/74 | HR 70 | Temp 97.3°F | Ht 63.5 in | Wt 164.0 lb

## 2017-05-16 DIAGNOSIS — Z85118 Personal history of other malignant neoplasm of bronchus and lung: Secondary | ICD-10-CM

## 2017-05-16 DIAGNOSIS — I700 Atherosclerosis of aorta: Secondary | ICD-10-CM

## 2017-05-16 DIAGNOSIS — N400 Enlarged prostate without lower urinary tract symptoms: Secondary | ICD-10-CM | POA: Diagnosis not present

## 2017-05-16 DIAGNOSIS — E663 Overweight: Secondary | ICD-10-CM | POA: Diagnosis not present

## 2017-05-16 DIAGNOSIS — R6889 Other general symptoms and signs: Secondary | ICD-10-CM

## 2017-05-16 DIAGNOSIS — Z0000 Encounter for general adult medical examination without abnormal findings: Secondary | ICD-10-CM

## 2017-05-16 DIAGNOSIS — Z79899 Other long term (current) drug therapy: Secondary | ICD-10-CM

## 2017-05-16 DIAGNOSIS — Z87891 Personal history of nicotine dependence: Secondary | ICD-10-CM | POA: Diagnosis not present

## 2017-05-16 DIAGNOSIS — Z0001 Encounter for general adult medical examination with abnormal findings: Secondary | ICD-10-CM | POA: Diagnosis not present

## 2017-05-16 DIAGNOSIS — E782 Mixed hyperlipidemia: Secondary | ICD-10-CM | POA: Diagnosis not present

## 2017-05-16 DIAGNOSIS — E559 Vitamin D deficiency, unspecified: Secondary | ICD-10-CM

## 2017-05-16 DIAGNOSIS — R7303 Prediabetes: Secondary | ICD-10-CM

## 2017-05-16 DIAGNOSIS — I10 Essential (primary) hypertension: Secondary | ICD-10-CM

## 2017-05-16 DIAGNOSIS — R7309 Other abnormal glucose: Secondary | ICD-10-CM | POA: Diagnosis not present

## 2017-05-16 LAB — HEPATIC FUNCTION PANEL
AG RATIO: 2.3 (calc) (ref 1.0–2.5)
ALBUMIN MSPROF: 4.3 g/dL (ref 3.6–5.1)
ALT: 15 U/L (ref 9–46)
AST: 20 U/L (ref 10–35)
Alkaline phosphatase (APISO): 68 U/L (ref 40–115)
BILIRUBIN DIRECT: 0.2 mg/dL (ref 0.0–0.2)
BILIRUBIN INDIRECT: 0.6 mg/dL (ref 0.2–1.2)
GLOBULIN: 1.9 g/dL (ref 1.9–3.7)
TOTAL PROTEIN: 6.2 g/dL (ref 6.1–8.1)
Total Bilirubin: 0.8 mg/dL (ref 0.2–1.2)

## 2017-05-16 LAB — BASIC METABOLIC PANEL WITH GFR
BUN: 14 mg/dL (ref 7–25)
CALCIUM: 9.5 mg/dL (ref 8.6–10.3)
CHLORIDE: 106 mmol/L (ref 98–110)
CO2: 31 mmol/L (ref 20–32)
CREATININE: 1.01 mg/dL (ref 0.70–1.18)
GFR, EST AFRICAN AMERICAN: 82 mL/min/{1.73_m2} (ref >=60)
GFR, Est Non African American: 70 mL/min/{1.73_m2} (ref >=60)
Glucose, Bld: 89 mg/dL (ref 65–99)
Potassium: 4.4 mmol/L (ref 3.5–5.3)
Sodium: 142 mmol/L (ref 135–146)

## 2017-05-16 LAB — CBC WITH DIFFERENTIAL/PLATELET
Basophils Absolute: 70 cells/uL (ref 0–200)
Basophils Relative: 1.1 %
EOS ABS: 128 {cells}/uL (ref 15–500)
Eosinophils Relative: 2 %
HCT: 39.1 % (ref 38.5–50.0)
HEMOGLOBIN: 13.5 g/dL (ref 13.2–17.1)
LYMPHS ABS: 3187 {cells}/uL (ref 850–3900)
MCH: 31.4 pg (ref 27.0–33.0)
MCHC: 34.5 g/dL (ref 32.0–36.0)
MCV: 90.9 fL (ref 80.0–100.0)
MPV: 10.6 fL (ref 7.5–12.5)
Monocytes Relative: 6.3 %
Neutro Abs: 2611 cells/uL (ref 1500–7800)
Neutrophils Relative %: 40.8 %
Platelets: 189 10*3/uL (ref 140–400)
RBC: 4.3 10*6/uL (ref 4.20–5.80)
RDW: 12.3 % (ref 11.0–15.0)
Total Lymphocyte: 49.8 %
WBC: 6.4 10*3/uL (ref 3.8–10.8)
WBCMIX: 403 {cells}/uL (ref 200–950)

## 2017-05-16 LAB — LIPID PANEL
CHOLESTEROL: 148 mg/dL (ref <200)
HDL: 38 mg/dL — AB (ref >40)
LDL CHOLESTEROL (CALC): 90 mg/dL
NON-HDL CHOLESTEROL (CALC): 110 mg/dL (ref <130)
Total CHOL/HDL Ratio: 3.9 (calc) (ref <5.0)
Triglycerides: 108 mg/dL (ref <150)

## 2017-05-16 LAB — TSH: TSH: 1.16 mIU/L (ref 0.40–4.50)

## 2017-05-16 NOTE — Patient Instructions (Signed)
Aim for 7+ servings of fruits and vegetables daily  80+ fluid ounces of water or unsweet tea for healthy kidneys  Limit alcohol intake  Limit animal fats in diet for cholesterol and heart health - choose grass fed whenever available  Aim for low stress - take time to unwind and care for your mental health  Aim for 150 min of moderate intensity exercise weekly for heart health, and weights twice weekly for bone health  Aim for 7-9 hours of sleep daily      When it comes to diets, agreement about the perfect plan isn't easy to find, even among the experts. Experts at the [place] developed an idea known as the Healthy Eating Plate. Just imagine a plate divided into logical, healthy portions.  The emphasis is on diet quality:  Load up on vegetables and fruits - one-half of your plate: Aim for color and variety, and remember that potatoes don't count.  Go for whole grains - one-quarter of your plate: Whole wheat, barley, wheat berries, quinoa, oats, brown rice, and foods made with them. If you want pasta, go with whole wheat pasta.  Protein power - one-quarter of your plate: Fish, chicken, beans, and nuts are all healthy, versatile protein sources. Limit red meat.  The diet, however, does go beyond the plate, offering a few other suggestions.  Use healthy plant oils, such as olive, canola, soy, corn, sunflower and peanut. Check the labels, and avoid partially hydrogenated oil, which have unhealthy trans fats.  If you're thirsty, drink water. Coffee and tea are good in moderation, but skip sugary drinks and limit milk and dairy products to one or two daily servings.  The type of carbohydrate in the diet is more important than the amount. Some sources of carbohydrates, such as vegetables, fruits, whole grains, and beans-are healthier than others.  Finally, stay active.

## 2017-06-12 ENCOUNTER — Other Ambulatory Visit: Payer: Self-pay | Admitting: Internal Medicine

## 2017-06-12 DIAGNOSIS — H34812 Central retinal vein occlusion, left eye, with macular edema: Secondary | ICD-10-CM | POA: Diagnosis not present

## 2017-06-12 LAB — HM DIABETES EYE EXAM

## 2017-06-14 ENCOUNTER — Encounter (INDEPENDENT_AMBULATORY_CARE_PROVIDER_SITE_OTHER): Payer: PPO | Admitting: Ophthalmology

## 2017-06-14 DIAGNOSIS — H43813 Vitreous degeneration, bilateral: Secondary | ICD-10-CM | POA: Diagnosis not present

## 2017-06-14 DIAGNOSIS — H2513 Age-related nuclear cataract, bilateral: Secondary | ICD-10-CM

## 2017-06-14 DIAGNOSIS — H35033 Hypertensive retinopathy, bilateral: Secondary | ICD-10-CM

## 2017-06-14 DIAGNOSIS — I10 Essential (primary) hypertension: Secondary | ICD-10-CM

## 2017-06-14 DIAGNOSIS — H34812 Central retinal vein occlusion, left eye, with macular edema: Secondary | ICD-10-CM

## 2017-06-25 ENCOUNTER — Encounter: Payer: Self-pay | Admitting: *Deleted

## 2017-07-11 ENCOUNTER — Encounter (INDEPENDENT_AMBULATORY_CARE_PROVIDER_SITE_OTHER): Payer: PPO | Admitting: Ophthalmology

## 2017-07-13 ENCOUNTER — Encounter (INDEPENDENT_AMBULATORY_CARE_PROVIDER_SITE_OTHER): Payer: PPO | Admitting: Ophthalmology

## 2017-07-13 DIAGNOSIS — H2513 Age-related nuclear cataract, bilateral: Secondary | ICD-10-CM | POA: Diagnosis not present

## 2017-07-13 DIAGNOSIS — H34812 Central retinal vein occlusion, left eye, with macular edema: Secondary | ICD-10-CM

## 2017-07-13 DIAGNOSIS — H35033 Hypertensive retinopathy, bilateral: Secondary | ICD-10-CM

## 2017-07-13 DIAGNOSIS — I10 Essential (primary) hypertension: Secondary | ICD-10-CM | POA: Diagnosis not present

## 2017-07-13 DIAGNOSIS — H43813 Vitreous degeneration, bilateral: Secondary | ICD-10-CM | POA: Diagnosis not present

## 2017-08-08 ENCOUNTER — Encounter (INDEPENDENT_AMBULATORY_CARE_PROVIDER_SITE_OTHER): Payer: PPO | Admitting: Ophthalmology

## 2017-08-08 DIAGNOSIS — H34812 Central retinal vein occlusion, left eye, with macular edema: Secondary | ICD-10-CM

## 2017-08-08 DIAGNOSIS — H43813 Vitreous degeneration, bilateral: Secondary | ICD-10-CM | POA: Diagnosis not present

## 2017-08-08 DIAGNOSIS — H35033 Hypertensive retinopathy, bilateral: Secondary | ICD-10-CM

## 2017-08-08 DIAGNOSIS — H2513 Age-related nuclear cataract, bilateral: Secondary | ICD-10-CM | POA: Diagnosis not present

## 2017-08-08 DIAGNOSIS — I10 Essential (primary) hypertension: Secondary | ICD-10-CM | POA: Diagnosis not present

## 2017-08-22 ENCOUNTER — Ambulatory Visit (INDEPENDENT_AMBULATORY_CARE_PROVIDER_SITE_OTHER): Payer: PPO | Admitting: Internal Medicine

## 2017-08-22 ENCOUNTER — Encounter: Payer: Self-pay | Admitting: Internal Medicine

## 2017-08-22 VITALS — BP 124/86 | HR 68 | Temp 98.6°F | Ht 63.5 in | Wt 156.8 lb

## 2017-08-22 DIAGNOSIS — E559 Vitamin D deficiency, unspecified: Secondary | ICD-10-CM

## 2017-08-22 DIAGNOSIS — Z1211 Encounter for screening for malignant neoplasm of colon: Secondary | ICD-10-CM

## 2017-08-22 DIAGNOSIS — R7303 Prediabetes: Secondary | ICD-10-CM | POA: Diagnosis not present

## 2017-08-22 DIAGNOSIS — Z79899 Other long term (current) drug therapy: Secondary | ICD-10-CM | POA: Diagnosis not present

## 2017-08-22 DIAGNOSIS — R7309 Other abnormal glucose: Secondary | ICD-10-CM | POA: Diagnosis not present

## 2017-08-22 DIAGNOSIS — E782 Mixed hyperlipidemia: Secondary | ICD-10-CM | POA: Diagnosis not present

## 2017-08-22 DIAGNOSIS — I10 Essential (primary) hypertension: Secondary | ICD-10-CM

## 2017-08-22 DIAGNOSIS — H9193 Unspecified hearing loss, bilateral: Secondary | ICD-10-CM | POA: Insufficient documentation

## 2017-08-22 DIAGNOSIS — Z1212 Encounter for screening for malignant neoplasm of rectum: Secondary | ICD-10-CM

## 2017-08-22 LAB — POC HEMOCCULT BLD/STL (HOME/3-CARD/SCREEN)
Card #2 Fecal Occult Blod, POC: NEGATIVE
FECAL OCCULT BLD: NEGATIVE
Fecal Occult Blood, POC: NEGATIVE

## 2017-08-22 NOTE — Progress Notes (Signed)
This very nice 82 y.o. single WM presents for 6 month follow up with HTN, HLD, Pre-Diabetes and Vitamin D Deficiency. Patientis s/pRUL lobectomy by VATS  for Primary Lung Cancer in 2008 and is followed by Dr Julien Nordmann and is also s/p TURPfor Prostate &Bladder cancers (2013) followed by Dr Diona Fanti.    Patient is treated for HTN (1998) & BP has been controlled at home. Today's BP is at goal - 124/86. Stess Myoview in Oct 2018 was Negative & Normal.  Patient has had no complaints of any cardiac type chest pain, palpitations, dyspnea / orthopnea / PND, dizziness, claudication, or dependent edema.     Hyperlipidemia is controlled with diet & meds. Patient denies myalgias or other med SE's. Last Lipids were  Lab Results  Component Value Date   CHOL 148 05/16/2017   HDL 38 (L) 05/16/2017   LDLCALC 90 05/16/2017   TRIG 108 05/16/2017   CHOLHDL 3.9 05/16/2017      Also, the patient has history of PreDiabetes (A1c 6.2%/2011) and has had no symptoms of reactive hypoglycemia, diabetic polys, paresthesias or visual blurring.  Last A1c was Normal & at goal: Lab Results  Component Value Date   HGBA1C 5.3 02/07/2017      Further, the patient also has history of Vitamin D Deficiency ("30"/2008) and supplements vitamin D without any suspected side-effects. Last vitamin D was at goal:  Lab Results  Component Value Date   VD25OH 11 02/07/2017   Current Outpatient Medications on File Prior to Visit  Medication Sig  . albuterol (PROVENTIL HFA;VENTOLIN HFA) 108 (90 Base) MCG/ACT inhaler Inhale 1-2 puffs into the lungs every 6 (six) hours as needed for wheezing or shortness of breath.  Marland Kitchen aspirin 81 MG tablet Take 81 mg by mouth daily.  . cetirizine (ZYRTEC) 10 MG tablet Take 1 tablet (10 mg total) by mouth daily. One tab daily for allergies  . Cholecalciferol (VITAMIN D) 2000 UNITS CAPS Take 8,000 Units by mouth daily.   Marland Kitchen glucose blood (FREESTYLE TEST STRIPS) test strip Check blood sugar 1 time  daily-DX-R73.03.  . glucose monitoring kit (FREESTYLE) monitoring kit Check blood sugar 1 time daily-DX-R73.03.  Marland Kitchen Lancets (FREESTYLE) lancets Check blood sugar 1 time daily-DX-R73.03.  . meloxicam (MOBIC) 15 MG tablet Take 1 tablet (15 mg total) by mouth daily.  . Multiple Vitamin (MULTIVITAMIN WITH MINERALS) TABS Take 1 tablet by mouth every evening.   . nitroGLYCERIN (NITROSTAT) 0.4 MG SL tablet Place 1 tablet (0.4 mg total) under the tongue every 5 (five) minutes x 3 doses as needed for chest pain.  . Omega-3 Fatty Acids (FISH OIL) 1000 MG CAPS Take 1 capsule by mouth every evening.   Marland Kitchen OVER THE COUNTER MEDICATION Reported on 01/26/2015  . pravastatin (PRAVACHOL) 40 MG tablet TAKE 1 TABLET(40 MG) BY MOUTH AT BEDTIME  . terazosin (HYTRIN) 10 MG capsule TAKE 1 CAPSULE(10 MG) BY MOUTH DAILY  . vitamin C (ASCORBIC ACID) 500 MG tablet Take 500 mg by mouth daily.   No current facility-administered medications on file prior to visit.    Allergies  Allergen Reactions  . Nitrofuran Derivatives Shortness Of Breath    macrobid   PMHx:   Past Medical History:  Diagnosis Date  . Allergy    seasonal  . Bladder tumor   . Cancer ([place])    bladder,, lung  . COPD (chronic obstructive pulmonary disease) ([place])   . History of kidney stones   . History of lung cancer  2008  S/P RIGHT UPPER LOPECTOMY    STAGE I  NON-SMALL CELL LUNG CANCER--  ONCOLOGIST- DR MOHAMED-   NO RECURRENCE  . Hyperlipidemia   . Hypertension   . Impaired hearing bilateral aids  . Irregular heart rate    per pt heart will skip a beat   . Unspecified vitamin D deficiency    Immunization History  Administered Date(s) Administered  . Influenza, High Dose Seasonal PF 11/03/2014, 11/02/2016  . Influenza,inj,Quad PF,6+ Mos 11/09/2015  . Influenza-Unspecified 11/06/2012  . Pneumococcal Conjugate-13 01/20/2014  . Pneumococcal-Unspecified 02/06/2002, 09/23/2012  . Tdap 03/09/2010  . Zoster 08/03/2014   Past Surgical History:    Procedure Laterality Date  . COLONOSCOPY  05-09-2004   HPP and tics [facility]   . FIBEROPTIC BRONCHOSCOPY  10-28-2008  . LITHOTRIPSY  2012  . LUNG REMOVAL, PARTIAL  2008  . PROSTATE SURGERY     prostatectomy  . RIGHT UPPER LUNG LOBECTOMY  09-21-2006  DR BURNEY   NON-SMALL CELL CANCER/ SEVERE COPD  . TRANSURETHRAL RESECTION OF BLADDER TUMOR  12/25/2011   Procedure: TRANSURETHRAL RESECTION OF BLADDER TUMOR (TURBT);  Surgeon: Franchot Gallo, MD;  Location: [facility];  Service: Urology;  Laterality: N/A;  . TRANSURETHRAL RESECTION OF PROSTATE  12/25/2011   Procedure: TRANSURETHRAL RESECTION OF THE PROSTATE WITH GYRUS INSTRUMENTS;  Surgeon: Franchot Gallo, MD;  Location: [facility];  Service: Urology;  Laterality: N/A;  2 HRS    FHx:    Reviewed / unchanged  SHx:    Reviewed / unchanged   Systems Review:  Constitutional: Denies fever, chills, wt changes, headaches, insomnia, fatigue, night sweats, change in appetite. Eyes: Denies redness, blurred vision, diplopia, discharge, itchy, watery eyes.  ENT: Denies discharge, congestion, post nasal drip, epistaxis, sore throat, earache, hearing loss, dental pain, tinnitus, vertigo, sinus pain, snoring.  CV: Denies chest pain, palpitations, irregular heartbeat, syncope, dyspnea, diaphoresis, orthopnea, PND, claudication or edema. Respiratory: denies cough, dyspnea, DOE, pleurisy, hoarseness, laryngitis, wheezing.  Gastrointestinal: Denies dysphagia, odynophagia, heartburn, reflux, water brash, abdominal pain or cramps, nausea, vomiting, bloating, diarrhea, constipation, hematemesis, melena, hematochezia  or hemorrhoids. Genitourinary: Denies dysuria, frequency, urgency, nocturia, hesitancy, discharge, hematuria or flank pain. Musculoskeletal: Denies arthralgias, myalgias, stiffness, jt. swelling, pain, limping or strain/sprain.  Skin: Denies pruritus, rash, hives, warts, acne, eczema or change in skin  lesion(s). Neuro: No weakness, tremor, incoordination, spasms, paresthesia or pain. Psychiatric: Denies confusion, memory loss or sensory loss. Endo: Denies change in weight, skin or hair change.  Heme/Lymph: No excessive bleeding, bruising or enlarged lymph nodes.  Physical Exam  BP 124/86   Pulse 68   Temp 98.6 F (37 C)   Ht 5' 3.5" (1.613 m)   Wt 156 lb 12.8 oz (71.1 kg)   SpO2 98%   BMI 27.34 kg/m   Appears  well nourished, well groomed  and in no distress.  Eyes: PERRLA, EOMs, conjunctiva no swelling or erythema. Sinuses: No frontal/maxillary tenderness ENT/Mouth: EAC's clear, TM's nl w/o erythema, bulging. Nares clear w/o erythema, swelling, exudates. Oropharynx clear without erythema or exudates. Oral hygiene is good. Tongue normal, non obstructing. Hearing intact.  Neck: Supple. Thyroid not palpable. Car 2+/2+ without bruits, nodes or JVD. Chest: Respirations nl with BS clear & equal w/o rales, rhonchi, wheezing or stridor.  Cor: Heart sounds normal w/ regular rate and rhythm without sig. murmurs, gallops, clicks or rubs. Peripheral pulses normal and equal  without edema.  Abdomen: Soft & bowel sounds normal. Non-tender w/o guarding, rebound,  hernias, masses or organomegaly.  Lymphatics: Unremarkable.  Musculoskeletal: Full ROM all peripheral extremities, joint stability, 5/5 strength and normal gait.  Skin: Warm, dry without exposed rashes, lesions or ecchymosis apparent.  Neuro: Cranial nerves intact, reflexes equal bilaterally. Sensory-motor testing grossly intact. Tendon reflexes grossly intact.  Pysch: Alert & oriented x 3.  Insight and judgement nl & appropriate. No ideations.  Assessment and Plan:  1. Essential hypertension  - Continue medication, monitor blood pressure at home.  - Continue DASH diet.  Reminder to go to the ER if any CP,  SOB, nausea, dizziness, severe HA, changes vision/speech.  - CBC with Differential/Platelet - COMPLETE METABOLIC PANEL  WITH GFR - Magnesium - TSH  2. Hyperlipidemia, mixed  - Continue diet/meds, exercise,& lifestyle modifications.  - Continue monitor periodic cholesterol/liver & renal functions   - Lipid panel - TSH  3. Abnormal glucose  - Continue diet, exercise, lifestyle modifications.  - Monitor appropriate labs.  - Hemoglobin A1c - Insulin, random  4. Prediabetes  - Continue supplementation.   - VITAMIN D 25 Hydroxyl  5. Vitamin D deficiency  - Hemoglobin A1c - Insulin, random  6. Medication management  - CBC with Differential/Platelet - COMPLETE METABOLIC PANEL WITH GFR - Magnesium - Lipid panel - TSH - Hemoglobin A1c - Insulin, random - VITAMIN D 25 Hydroxyl  7. Screening for colorectal cancer  - POC Hemoccult Bld/Stl        Discussed  regular exercise, BP monitoring, weight control to achieve/maintain BMI less than 25 and discussed med and SE's. Recommended labs to assess and monitor clinical status with further disposition pending results of labs. Over 30 minutes of exam, counseling, chart review was performed.

## 2017-08-22 NOTE — Patient Instructions (Signed)

## 2017-08-23 LAB — COMPLETE METABOLIC PANEL WITH GFR
AG Ratio: 1.9 (calc) (ref 1.0–2.5)
ALKALINE PHOSPHATASE (APISO): 79 U/L (ref 40–115)
ALT: 13 U/L (ref 9–46)
AST: 18 U/L (ref 10–35)
Albumin: 4.4 g/dL (ref 3.6–5.1)
BILIRUBIN TOTAL: 0.7 mg/dL (ref 0.2–1.2)
BUN: 14 mg/dL (ref 7–25)
CHLORIDE: 105 mmol/L (ref 98–110)
CO2: 28 mmol/L (ref 20–32)
CREATININE: 0.94 mg/dL (ref 0.70–1.18)
Calcium: 9.7 mg/dL (ref 8.6–10.3)
GFR, Est African American: 89 mL/min/{1.73_m2} (ref >=60)
GFR, Est Non African American: 77 mL/min/{1.73_m2} (ref >=60)
GLUCOSE: 88 mg/dL (ref 65–99)
Globulin: 2.3 g/dL (calc) (ref 1.9–3.7)
Potassium: 4.1 mmol/L (ref 3.5–5.3)
Sodium: 140 mmol/L (ref 135–146)
Total Protein: 6.7 g/dL (ref 6.1–8.1)

## 2017-08-23 LAB — CBC WITH DIFFERENTIAL/PLATELET
BASOS PCT: 0.8 %
Basophils Absolute: 58 cells/uL (ref 0–200)
EOS PCT: 1.9 %
Eosinophils Absolute: 137 cells/uL (ref 15–500)
HCT: 41.1 % (ref 38.5–50.0)
Hemoglobin: 13.9 g/dL (ref 13.2–17.1)
LYMPHS ABS: 3506 {cells}/uL (ref 850–3900)
MCH: 31.6 pg (ref 27.0–33.0)
MCHC: 33.8 g/dL (ref 32.0–36.0)
MCV: 93.4 fL (ref 80.0–100.0)
MPV: 10.9 fL (ref 7.5–12.5)
Monocytes Relative: 6.2 %
NEUTROS PCT: 42.4 %
Neutro Abs: 3053 cells/uL (ref 1500–7800)
PLATELETS: 195 10*3/uL (ref 140–400)
RBC: 4.4 10*6/uL (ref 4.20–5.80)
RDW: 12.3 % (ref 11.0–15.0)
TOTAL LYMPHOCYTE: 48.7 %
WBC mixed population: 446 cells/uL (ref 200–950)
WBC: 7.2 10*3/uL (ref 3.8–10.8)

## 2017-08-23 LAB — LIPID PANEL
CHOLESTEROL: 160 mg/dL (ref <200)
HDL: 43 mg/dL (ref >40)
LDL Cholesterol (Calc): 94 mg/dL (calc)
Non-HDL Cholesterol (Calc): 117 mg/dL (calc) (ref <130)
TRIGLYCERIDES: 126 mg/dL (ref <150)
Total CHOL/HDL Ratio: 3.7 (calc) (ref <5.0)

## 2017-08-23 LAB — HEMOGLOBIN A1C
Hgb A1c MFr Bld: 5.2 % of total Hgb (ref <5.7)
MEAN PLASMA GLUCOSE: 103 (calc)
eAG (mmol/L): 5.7 (calc)

## 2017-08-23 LAB — MAGNESIUM: Magnesium: 2.2 mg/dL (ref 1.5–2.5)

## 2017-08-23 LAB — INSULIN, RANDOM: INSULIN: 4.7 u[IU]/mL (ref 2.0–19.6)

## 2017-08-23 LAB — TSH: TSH: 1.61 mIU/L (ref 0.40–4.50)

## 2017-08-23 LAB — VITAMIN D 25 HYDROXY (VIT D DEFICIENCY, FRACTURES): VIT D 25 HYDROXY: 67 ng/mL (ref 30–100)

## 2017-09-09 ENCOUNTER — Other Ambulatory Visit: Payer: Self-pay | Admitting: Internal Medicine

## 2017-09-13 ENCOUNTER — Encounter (INDEPENDENT_AMBULATORY_CARE_PROVIDER_SITE_OTHER): Payer: PPO | Admitting: Ophthalmology

## 2017-09-13 DIAGNOSIS — I10 Essential (primary) hypertension: Secondary | ICD-10-CM | POA: Diagnosis not present

## 2017-09-13 DIAGNOSIS — H43813 Vitreous degeneration, bilateral: Secondary | ICD-10-CM | POA: Diagnosis not present

## 2017-09-13 DIAGNOSIS — H2513 Age-related nuclear cataract, bilateral: Secondary | ICD-10-CM

## 2017-09-13 DIAGNOSIS — H34812 Central retinal vein occlusion, left eye, with macular edema: Secondary | ICD-10-CM

## 2017-09-13 DIAGNOSIS — H35033 Hypertensive retinopathy, bilateral: Secondary | ICD-10-CM

## 2017-10-25 ENCOUNTER — Encounter (INDEPENDENT_AMBULATORY_CARE_PROVIDER_SITE_OTHER): Payer: PPO | Admitting: Ophthalmology

## 2017-10-25 DIAGNOSIS — H35033 Hypertensive retinopathy, bilateral: Secondary | ICD-10-CM | POA: Diagnosis not present

## 2017-10-25 DIAGNOSIS — I10 Essential (primary) hypertension: Secondary | ICD-10-CM | POA: Diagnosis not present

## 2017-10-25 DIAGNOSIS — H34812 Central retinal vein occlusion, left eye, with macular edema: Secondary | ICD-10-CM

## 2017-10-25 DIAGNOSIS — H43813 Vitreous degeneration, bilateral: Secondary | ICD-10-CM | POA: Diagnosis not present

## 2017-10-25 DIAGNOSIS — H2513 Age-related nuclear cataract, bilateral: Secondary | ICD-10-CM

## 2017-11-26 NOTE — Progress Notes (Signed)
FOLLOW UP  Assessment and Plan:   Atherosclerosis of aorta Control blood pressure, cholesterol, glucose, increase exercise.   Hypertension Well controlled with current medications  Monitor blood pressure at home; patient to call if consistently greater than 130/80 Continue DASH diet.   Reminder to go to the ER if any CP, SOB, nausea, dizziness, severe HA, changes vision/speech, left arm numbness and tingling and jaw pain.  Cholesterol Currently at goal; continue pravastatin Continue low cholesterol diet and exercise.  Check lipid panel.   Glucose management Recent A1Cs at goal Discussed diet/exercise, weight management  Defer A1C; check CMP  Overweight Long discussion about weight loss, diet, and exercise Recommended diet heavy in fruits and veggies and low in animal meats, cheeses, and dairy products, appropriate calorie intake Discussed ideal weight for height  Will follow up in 3 months  Vitamin D Def At goal at last visit; continue supplementation to maintain goal of 70-100 Defer Vit D level  Continue diet and meds as discussed. Further disposition pending results of labs. Discussed med's effects and SE's.   Over 30 minutes of exam, counseling, chart review, and critical decision making was performed.   Future Appointments  Date Time Provider [place]  12/06/2017  7:45 AM Hayden Pedro, MD [facility] None  02/18/2018  2:00 PM [name], MD [id number] None    ----------------------------------------------------------------------------------------------------------------------  HPI 82 y.o. male  presents for 3 month follow up on hypertension, cholesterol, diabetes, weight and vitamin D deficiency. Patient is s/p RUL lobectomy by VATS  for Primary Lung Cancer in 2008 followed by Dr Julien Nordmann and also is s/p TURP for Prostate Cancer & Bladder cancer in 2013 by Dr Diona Fanti. He has been released by both and to follow up only if new concerns. He has known  aortic atherosclerosis by imaging; has had normal/low risk myoview by Dr. Gwenlyn Found in 11/2015.   BMI is Body mass index is 27.9 kg/m., he has been working on diet and exercise, aims for 10000 steps daily, goes to gym occasionally.  Wt Readings from Last 3 Encounters:  11/28/17 160 lb (72.6 kg)  08/22/17 156 lb 12.8 oz (71.1 kg)  05/16/17 164 lb (74.4 kg)   Today their BP is BP: 114/60  He does workout. He denies chest pain, shortness of breath, dizziness.   He was on pravastatin but was discontinued. His cholesterol is at goal. The cholesterol last visit was:   Lab Results  Component Value Date   CHOL 160 08/22/2017   HDL 43 08/22/2017   LDLCALC 94 08/22/2017   TRIG 126 08/22/2017   CHOLHDL 3.7 08/22/2017    He has been working on diet and exercise for glucose management, and denies increased appetite, nausea, paresthesia of the feet, polydipsia, polyuria and visual disturbances. Last A1C in the office was:  Lab Results  Component Value Date   HGBA1C 5.2 08/22/2017   Patient is on Vitamin D supplement.   Lab Results  Component Value Date   VD25OH 67 08/22/2017       Current Medications:  Current Outpatient Medications on File Prior to Visit  Medication Sig  . aspirin 81 MG tablet Take 81 mg by mouth daily.  . cetirizine (ZYRTEC) 10 MG tablet Take 1 tablet (10 mg total) by mouth daily. One tab daily for allergies  . glucose blood (FREESTYLE TEST STRIPS) test strip Check blood sugar 1 time daily-DX-R73.03.  . glucose monitoring kit (FREESTYLE) monitoring kit Check blood sugar 1 time daily-DX-R73.03.  Marland Kitchen Lancets (FREESTYLE) lancets  Check blood sugar 1 time daily-DX-R73.03.  . meloxicam (MOBIC) 15 MG tablet Take 1 tablet (15 mg total) by mouth daily.  . Multiple Vitamin (MULTIVITAMIN WITH MINERALS) TABS Take 1 tablet by mouth every evening.   . nitroGLYCERIN (NITROSTAT) 0.4 MG SL tablet Place 1 tablet (0.4 mg total) under the tongue every 5 (five) minutes x 3 doses as needed for  chest pain.  . Omega-3 Fatty Acids (FISH OIL) 1000 MG CAPS Take 1 capsule by mouth every evening.   . pravastatin (PRAVACHOL) 40 MG tablet TAKE 1 TABLET(40 MG) BY MOUTH AT BEDTIME  . terazosin (HYTRIN) 10 MG capsule TAKE 1 CAPSULE(10 MG) BY MOUTH DAILY  . vitamin C (ASCORBIC ACID) 500 MG tablet Take 500 mg by mouth daily.  Marland Kitchen albuterol (PROVENTIL HFA;VENTOLIN HFA) 108 (90 Base) MCG/ACT inhaler Inhale 1-2 puffs into the lungs every 6 (six) hours as needed for wheezing or shortness of breath. (Patient not taking: Reported on 11/28/2017)  . Cholecalciferol (VITAMIN D) 2000 UNITS CAPS Take 8,000 Units by mouth daily.   Marland Kitchen OVER THE COUNTER MEDICATION Reported on 01/26/2015   No current facility-administered medications on file prior to visit.      Allergies:  Allergies  Allergen Reactions  . Nitrofuran Derivatives Shortness Of Breath    macrobid     Medical History:  Past Medical History:  Diagnosis Date  . Allergy    seasonal  . Bladder tumor   . Cancer ([place])    bladder,, lung  . COPD (chronic obstructive pulmonary disease) ([place])   . History of kidney stones   . History of lung cancer 2008  S/P RIGHT UPPER LOPECTOMY    STAGE I  NON-SMALL CELL LUNG CANCER--  ONCOLOGIST- DR MOHAMED-   NO RECURRENCE  . Hyperlipidemia   . Hypertension   . Impaired hearing bilateral aids  . Irregular heart rate    per pt heart will skip a beat   . Unspecified vitamin D deficiency    Family history- Reviewed and unchanged Social history- Reviewed and unchanged   Review of Systems:  Review of Systems  Constitutional: Negative for malaise/fatigue and weight loss.  HENT: Negative for hearing loss and tinnitus.   Eyes: Negative for blurred vision and double vision.  Respiratory: Negative for cough, shortness of breath and wheezing.   Cardiovascular: Negative for chest pain, palpitations, orthopnea, claudication and leg swelling.  Gastrointestinal: Negative for abdominal pain, blood in stool,  constipation, diarrhea, heartburn, melena, nausea and vomiting.  Genitourinary: Negative.   Musculoskeletal: Negative for joint pain and myalgias.  Skin: Negative for rash.  Neurological: Negative for dizziness, tingling, sensory change, weakness and headaches.  Endo/Heme/Allergies: Negative for polydipsia.  Psychiatric/Behavioral: Negative.   All other systems reviewed and are negative.   Physical Exam: BP 114/60   Pulse 71   Temp (!) 96.4 F (35.8 C)   Ht 5' 3.5" (1.613 m)   Wt 160 lb (72.6 kg)   SpO2 97%   BMI 27.90 kg/m  Wt Readings from Last 3 Encounters:  11/28/17 160 lb (72.6 kg)  08/22/17 156 lb 12.8 oz (71.1 kg)  05/16/17 164 lb (74.4 kg)   General Appearance: Well nourished, in no apparent distress. Eyes: PERRLA, EOMs, conjunctiva no swelling or erythema Sinuses: No Frontal/maxillary tenderness ENT/Mouth: Ext aud canals clear, TMs without erythema, bulging. No erythema, swelling, or exudate on post pharynx.  Tonsils not swollen or erythematous. Hearing normal.  Neck: Supple, thyroid normal.  Respiratory: Respiratory effort normal, BS equal bilaterally without  rales, rhonchi, wheezing or stridor.  Cardio: RRR with no MRGs. Brisk peripheral pulses without edema.  Abdomen: Soft, + BS.  Non tender, no guarding, rebound, hernias, masses. Lymphatics: Non tender without lymphadenopathy.  Musculoskeletal: Full ROM, 5/5 strength, Normal gait Skin: Warm, dry without rashes, lesions, ecchymosis.  Neuro: Cranial nerves intact. No cerebellar symptoms.  Psych: Awake and oriented X 3, normal affect, Insight and Judgment appropriate.    Izora Ribas, NP 9:38 AM [facility]

## 2017-11-28 ENCOUNTER — Encounter: Payer: Self-pay | Admitting: Adult Health

## 2017-11-28 ENCOUNTER — Ambulatory Visit (INDEPENDENT_AMBULATORY_CARE_PROVIDER_SITE_OTHER): Payer: PPO | Admitting: Adult Health

## 2017-11-28 VITALS — BP 114/60 | HR 71 | Temp 96.4°F | Ht 63.5 in | Wt 160.0 lb

## 2017-11-28 DIAGNOSIS — I700 Atherosclerosis of aorta: Secondary | ICD-10-CM

## 2017-11-28 DIAGNOSIS — Z23 Encounter for immunization: Secondary | ICD-10-CM

## 2017-11-28 DIAGNOSIS — E782 Mixed hyperlipidemia: Secondary | ICD-10-CM | POA: Diagnosis not present

## 2017-11-28 DIAGNOSIS — R7309 Other abnormal glucose: Secondary | ICD-10-CM

## 2017-11-28 DIAGNOSIS — E559 Vitamin D deficiency, unspecified: Secondary | ICD-10-CM

## 2017-11-28 DIAGNOSIS — I10 Essential (primary) hypertension: Secondary | ICD-10-CM

## 2017-11-28 DIAGNOSIS — E663 Overweight: Secondary | ICD-10-CM | POA: Diagnosis not present

## 2017-11-28 NOTE — Addendum Note (Signed)
Addended by: Chancy Hurter on: 11/28/2017 09:57 AM   Modules accepted: Orders

## 2017-11-28 NOTE — Patient Instructions (Signed)
Goals    . LDL CALC < 100      Can try sennakot or miralax for constipation as needed    Know what a healthy weight is for you (roughly BMI <25) and aim to maintain this  Aim for 7+ servings of fruits and vegetables daily  65-80+ fluid ounces of water or unsweet tea for healthy kidneys  Limit to max 1 drink of alcohol per day; avoid smoking/tobacco  Limit animal fats in diet for cholesterol and heart health - choose grass fed whenever available  Avoid highly processed foods, and foods high in saturated/trans fats  Aim for low stress - take time to unwind and care for your mental health  Aim for 150 min of moderate intensity exercise weekly for heart health, and weights twice weekly for bone health  Aim for 7-9 hours of sleep daily      Constipation, Adult Constipation is when a person has fewer bowel movements in a week than normal, has difficulty having a bowel movement, or has stools that are dry, hard, or larger than normal. Constipation may be caused by an underlying condition. It may become worse with age if a person takes certain medicines and does not take in enough fluids. Follow these instructions at home: Eating and drinking   Eat foods that have a lot of fiber, such as fresh fruits and vegetables, whole grains, and beans.  Limit foods that are high in fat, low in fiber, or overly processed, such as french fries, hamburgers, cookies, candies, and soda.  Drink enough fluid to keep your urine clear or pale yellow. General instructions  Exercise regularly or as told by your health care provider.  Go to the restroom when you have the urge to go. Do not hold it in.  Take over-the-counter and prescription medicines only as told by your health care provider. These include any fiber supplements.  Practice pelvic floor retraining exercises, such as deep breathing while relaxing the lower abdomen and pelvic floor relaxation during bowel movements.  Watch your  condition for any changes.  Keep all follow-up visits as told by your health care provider. This is important. Contact a health care provider if:  You have pain that gets worse.  You have a fever.  You do not have a bowel movement after 4 days.  You vomit.  You are not hungry.  You lose weight.  You are bleeding from the anus.  You have thin, pencil-like stools. Get help right away if:  You have a fever and your symptoms suddenly get worse.  You leak stool or have blood in your stool.  Your abdomen is bloated.  You have severe pain in your abdomen.  You feel dizzy or you faint. This information is not intended to replace advice given to you by your health care provider. Make sure you discuss any questions you have with your health care provider. Document Released: 10/22/2003 Document Revised: 08/13/2015 Document Reviewed: 07/14/2015 Elsevier Interactive Patient Education  2018 [employer].

## 2017-11-29 ENCOUNTER — Other Ambulatory Visit: Payer: Self-pay | Admitting: Adult Health

## 2017-11-29 DIAGNOSIS — E0590 Thyrotoxicosis, unspecified without thyrotoxic crisis or storm: Secondary | ICD-10-CM

## 2017-11-29 LAB — COMPLETE METABOLIC PANEL WITH GFR
AG RATIO: 2 (calc) (ref 1.0–2.5)
ALBUMIN MSPROF: 4.5 g/dL (ref 3.6–5.1)
ALKALINE PHOSPHATASE (APISO): 86 U/L (ref 40–115)
ALT: 21 U/L (ref 9–46)
AST: 22 U/L (ref 10–35)
BILIRUBIN TOTAL: 1 mg/dL (ref 0.2–1.2)
BUN: 14 mg/dL (ref 7–25)
CHLORIDE: 105 mmol/L (ref 98–110)
CO2: 28 mmol/L (ref 20–32)
Calcium: 9.8 mg/dL (ref 8.6–10.3)
Creat: 0.88 mg/dL (ref 0.70–1.11)
GFR, EST AFRICAN AMERICAN: 94 mL/min/{1.73_m2} (ref >=60)
GFR, Est Non African American: 81 mL/min/{1.73_m2} (ref >=60)
GLOBULIN: 2.2 g/dL (ref 1.9–3.7)
Glucose, Bld: 83 mg/dL (ref 65–99)
Potassium: 4.1 mmol/L (ref 3.5–5.3)
SODIUM: 141 mmol/L (ref 135–146)
Total Protein: 6.7 g/dL (ref 6.1–8.1)

## 2017-11-29 LAB — LIPID PANEL
Cholesterol: 203 mg/dL — ABNORMAL HIGH (ref <200)
HDL: 44 mg/dL (ref >40)
LDL CHOLESTEROL (CALC): 136 mg/dL — AB
NON-HDL CHOLESTEROL (CALC): 159 mg/dL — AB (ref <130)
Total CHOL/HDL Ratio: 4.6 (calc) (ref <5.0)
Triglycerides: 121 mg/dL (ref <150)

## 2017-11-29 LAB — CBC WITH DIFFERENTIAL/PLATELET
BASOS PCT: 0.8 %
Basophils Absolute: 58 cells/uL (ref 0–200)
EOS PCT: 1.6 %
Eosinophils Absolute: 117 cells/uL (ref 15–500)
HCT: 41.7 % (ref 38.5–50.0)
HEMOGLOBIN: 14.5 g/dL (ref 13.2–17.1)
Lymphs Abs: 3212 cells/uL (ref 850–3900)
MCH: 31.4 pg (ref 27.0–33.0)
MCHC: 34.8 g/dL (ref 32.0–36.0)
MCV: 90.3 fL (ref 80.0–100.0)
MONOS PCT: 7.6 %
MPV: 10.8 fL (ref 7.5–12.5)
NEUTROS ABS: 3358 {cells}/uL (ref 1500–7800)
Neutrophils Relative %: 46 %
Platelets: 215 10*3/uL (ref 140–400)
RBC: 4.62 10*6/uL (ref 4.20–5.80)
RDW: 12.5 % (ref 11.0–15.0)
Total Lymphocyte: 44 %
WBC mixed population: 555 cells/uL (ref 200–950)
WBC: 7.3 10*3/uL (ref 3.8–10.8)

## 2017-11-29 LAB — TSH: TSH: 0.15 m[IU]/L — AB (ref 0.40–4.50)

## 2017-11-29 LAB — MAGNESIUM: MAGNESIUM: 2.2 mg/dL (ref 1.5–2.5)

## 2017-12-06 ENCOUNTER — Encounter (INDEPENDENT_AMBULATORY_CARE_PROVIDER_SITE_OTHER): Payer: PPO | Admitting: Ophthalmology

## 2017-12-06 DIAGNOSIS — H43813 Vitreous degeneration, bilateral: Secondary | ICD-10-CM | POA: Diagnosis not present

## 2017-12-06 DIAGNOSIS — H34812 Central retinal vein occlusion, left eye, with macular edema: Secondary | ICD-10-CM | POA: Diagnosis not present

## 2017-12-06 DIAGNOSIS — H35033 Hypertensive retinopathy, bilateral: Secondary | ICD-10-CM

## 2017-12-06 DIAGNOSIS — H2513 Age-related nuclear cataract, bilateral: Secondary | ICD-10-CM | POA: Diagnosis not present

## 2017-12-06 DIAGNOSIS — I10 Essential (primary) hypertension: Secondary | ICD-10-CM | POA: Diagnosis not present

## 2018-01-08 ENCOUNTER — Ambulatory Visit: Payer: Self-pay

## 2018-01-09 ENCOUNTER — Ambulatory Visit (INDEPENDENT_AMBULATORY_CARE_PROVIDER_SITE_OTHER): Payer: PPO

## 2018-01-09 DIAGNOSIS — E0590 Thyrotoxicosis, unspecified without thyrotoxic crisis or storm: Secondary | ICD-10-CM | POA: Diagnosis not present

## 2018-01-09 NOTE — Progress Notes (Signed)
Reports for LAB----TSH Reports meds       NO MEDS                       at this time. Reports NO   concerns at this time.  Vitals taken at intake & entered.

## 2018-01-10 LAB — TSH: TSH: 2.04 mIU/L (ref 0.40–4.50)

## 2018-01-17 ENCOUNTER — Encounter (INDEPENDENT_AMBULATORY_CARE_PROVIDER_SITE_OTHER): Payer: PPO | Admitting: Ophthalmology

## 2018-01-17 DIAGNOSIS — H34812 Central retinal vein occlusion, left eye, with macular edema: Secondary | ICD-10-CM

## 2018-01-17 DIAGNOSIS — H2513 Age-related nuclear cataract, bilateral: Secondary | ICD-10-CM

## 2018-01-17 DIAGNOSIS — H35033 Hypertensive retinopathy, bilateral: Secondary | ICD-10-CM

## 2018-01-17 DIAGNOSIS — I10 Essential (primary) hypertension: Secondary | ICD-10-CM | POA: Diagnosis not present

## 2018-01-17 DIAGNOSIS — H43813 Vitreous degeneration, bilateral: Secondary | ICD-10-CM

## 2018-01-28 ENCOUNTER — Ambulatory Visit (INDEPENDENT_AMBULATORY_CARE_PROVIDER_SITE_OTHER): Payer: PPO | Admitting: Internal Medicine

## 2018-01-28 ENCOUNTER — Encounter: Payer: Self-pay | Admitting: Internal Medicine

## 2018-01-28 VITALS — BP 116/64 | HR 88 | Temp 97.0°F | Resp 16 | Ht 63.5 in | Wt 161.4 lb

## 2018-01-28 DIAGNOSIS — J40 Bronchitis, not specified as acute or chronic: Secondary | ICD-10-CM

## 2018-01-28 DIAGNOSIS — M25461 Effusion, right knee: Secondary | ICD-10-CM | POA: Diagnosis not present

## 2018-01-28 MED ORDER — PREDNISONE 20 MG PO TABS: ORAL_TABLET | ORAL | 0 refills | Status: DC

## 2018-01-28 MED ORDER — AZITHROMYCIN 250 MG PO TABS: ORAL_TABLET | ORAL | 1 refills | Status: DC

## 2018-01-28 NOTE — Progress Notes (Signed)
Subjective:    Patient ID: Raymond Patrick, male    DOB: [birth year], 82 y.o.   MRN: [id number]  HPI    Patient is a very nice 82 yo single WM with hx/o HTN remote smoker who presents with a 1 week prodrome of worsening chest congestion and sputum, now expectorating a thick yellowish green sputum. Denies head congestion, fever, chills, sweats, rash or dyspnea.     Patient also relates falling striking his Rt knee & swelling has persisted.    Medication Sig  . aspirin 81 MG tablet Take  daily.  . cetirizine 10 MG tablet Take one tab daily for allergies  . VITAMIN D 2000 UNITS CAPS Take 8,000 Unitsdaily.   . meloxicam 15 MG  Take 1 tablet  daily.  . Multi-Vit w/min Take 1 tablet  every evening.   Marland Kitchen NITROSTAT 0.4 MG SL  as needed for chest pain.  . Omega-3 FISH OIL 1000 MG  Take 1 capsule every evening.   . terazosin  10 MG  TAKE 1 CAPSULE  DAILY  . vitamin C  500 MG Take  daily.   Allergies  Allergen Reactions  . Nitrofuran Derivatives Shortness Of Breath    macrobid   Past Medical History:  Diagnosis Date  . Allergy    seasonal  . Bladder tumor   . Cancer ([place])    bladder,, lung  . COPD (chronic obstructive pulmonary disease) ([place])   . History of kidney stones   . History of lung cancer 2008  S/P RIGHT UPPER LOPECTOMY    STAGE I  NON-SMALL CELL LUNG CANCER--  ONCOLOGIST- DR MOHAMED-   NO RECURRENCE  . Hyperlipidemia   . Hypertension   . Impaired hearing bilateral aids  . Irregular heart rate    per pt heart will skip a beat   . Unspecified vitamin D deficiency    Review of Systems    10 point systems review negative except as above.    Objective:   Physical Exam   BP 116/64   Pulse 88   Temp (!) 97 F (36.1 C)   Resp 16   Ht 5' 3.5" (1.613 m)   Wt 161 lb 6.4 oz (73.2 kg)   BMI 28.14 kg/m  O2 sat 94-95%  In No Distress. No stridor. Speech hoarse. Cough congested.   HEENT - Eac's patent. TM's Nl. EOM's full. PERRLA.  NasoOroPharynx clear. Fronto-maxillary  sinuses - non tender. Neck - supple. Nl Thyroid. Chest - Few scattered rales &  rhonchi, No wheezes. Cor - Nl HS. RRR w/o sig m. No edema. MS- FROM w/o deformities. Rt knee with Nl ROM. There is a non tender Rt prepatellar effusion - gestimating about 200-300 cc. Gait Nl. Neuro - No obvious Cr N abnormalities. Nl w/o focal abnormalities. Skin - exposed clear w/o rash cyanosis or icterus    Assessment & Plan:   1. Bronchitis  - predniSONE 20 MG tablet; 1 tab 3 x day for 3 days, then 1 tab 2 x day for 3 days, then 1 tab 1 x day for 5 days  Disp: 20 tablet  - azithromycin  250 MG tablet; Take 2 tablets (500 mg) on  Day 1,  followed by 1 tablet (250 mg) once daily on Days 2 through 5.  Disp: 6 each; Refill: 1  2. Prepatellar effusion of right knee  - Reassured would likely gradually reabsorb.   - recommend observe unless develops signs of infection as erythema. Warmth or increased  size.

## 2018-02-18 ENCOUNTER — Encounter: Payer: Self-pay | Admitting: Internal Medicine

## 2018-03-07 ENCOUNTER — Encounter (INDEPENDENT_AMBULATORY_CARE_PROVIDER_SITE_OTHER): Payer: PPO | Admitting: Ophthalmology

## 2018-03-10 ENCOUNTER — Encounter: Payer: Self-pay | Admitting: Internal Medicine

## 2018-03-10 NOTE — Patient Instructions (Signed)

## 2018-03-10 NOTE — Progress Notes (Signed)
[facility]   [name], M.D.     [name]. Silverio Lay, P.A.-C Liane Comber, [place]                [address] [place], [place] [address] Telephone [phone] Telefax [phone] Annual  Screening/Preventative Visit  & Comprehensive Evaluation & Examination     This very nice 83 y.o. single WM presents for a Screening /Preventative Visit & comprehensive evaluation and management of multiple medical co-morbidities.  Patient has been followed for HTN, HLD, Prediabetes and Vitamin D Deficiency.     In 2008, patient underwent RUL / Vats for a primary Lung Cancer and is followed by Dr Julien Nordmann and now with annual CXR. In 2013, patient underwent TURP for a Prostate & Bladder cancers and is followed by Dr Diona Fanti.       HTN predates circa 1998. Patient's BP has been controlled at home.  Today's BP is at goal -  118/66.  In Oct 2018, he had a negative / Normal Stress Myoview. Patient denies any cardiac symptoms as chest pain, palpitations, shortness of breath, dizziness or ankle swelling.     Patient's hyperlipidemia is usually controlled with diet and medications. Patient denies myalgias or other medication SE's. Last lipids were  Not at goal off of his Chol meds: Lab Results  Component Value Date   CHOL 203 (H) 11/28/2017   HDL 44 11/28/2017   LDLCALC 136 (H) 11/28/2017   TRIG 121 11/28/2017   CHOLHDL 4.6 11/28/2017      Patient has hx/o prediabetes (A1c 6.2% / 2011) and patient denies reactive hypoglycemic symptoms, visual blurring, diabetic polys or paresthesias. Last A1c was back at goal: Lab Results  Component Value Date   HGBA1C 5.2 08/22/2017       Finally, patient has history of Vitamin D Deficiency ("30" / 2008) and last vitamin D was at goal: Lab Results  Component Value Date   VD25OH 67 08/22/2017   Current Outpatient Medications on File Prior to Visit  Medication Sig  .  albuterol (PROVENTIL HFA;VENTOLIN HFA) 108 (90 Base) MCG/ACT inhaler Inhale 1-2 puffs into the lungs every 6 (six) hours as needed for wheezing or shortness of breath. (Patient not taking: Reported on 11/28/2017)  . aspirin 81 MG tablet Take 81 mg by mouth daily.  . cetirizine (ZYRTEC) 10 MG tablet Take 1 tablet (10 mg total) by mouth daily. One tab daily for allergies  . Cholecalciferol (VITAMIN D) 2000 UNITS CAPS Take 8,000 Units by mouth daily.   Marland Kitchen glucose blood (FREESTYLE TEST STRIPS) test strip Check blood sugar 1 time daily-DX-R73.03.  . glucose monitoring kit (FREESTYLE) monitoring kit Check blood sugar 1 time daily-DX-R73.03.  Marland Kitchen Lancets (FREESTYLE) lancets Check blood sugar 1 time daily-DX-R73.03.  . meloxicam (MOBIC) 15 MG tablet Take 1 tablet (15 mg total) by mouth daily.  . Multiple Vitamin (MULTIVITAMIN WITH MINERALS) TABS Take 1 tablet by mouth every evening.   . Omega-3 Fatty Acids (FISH OIL) 1000 MG CAPS Take 1 capsule by mouth every evening.   Marland Kitchen OVER THE COUNTER MEDICATION Reported on 01/26/2015  . terazosin (HYTRIN) 10 MG capsule TAKE 1 CAPSULE(10 MG) BY MOUTH DAILY  . vitamin C (ASCORBIC ACID) 500 MG tablet Take 500 mg by mouth daily.   No current facility-administered medications on file prior to visit.    Allergies  Allergen Reactions  .  Nitrofuran Derivatives Shortness Of Breath    macrobid   Past Medical History:  Diagnosis Date  . Allergy    seasonal  . Bladder tumor   . Cancer ([place])    bladder,, lung  . COPD (chronic obstructive pulmonary disease) ([place])   . Diverticulosis of sigmoid colon    2016  . History of kidney stones   . History of lung cancer 2008  S/P RIGHT UPPER LOPECTOMY    STAGE I  NON-SMALL CELL LUNG CANCER--  ONCOLOGIST- DR MOHAMED-   NO RECURRENCE  . Hyperlipidemia   . Hypertension   . Impaired hearing bilateral aids  . Irregular heart rate    per pt heart will skip a beat   . Unspecified vitamin D deficiency    Health Maintenance   Topic Date Due  . COLONOSCOPY  01/25/2018  . TETANUS/TDAP  03/09/2020  . INFLUENZA VACCINE  Completed  . PNA vac Low Risk Adult  Completed   Immunization History  Administered Date(s) Administered  . Influenza, High Dose Seasonal PF 11/03/2014, 11/02/2016, 11/28/2017  . Influenza,inj,Quad PF,6+ Mos 11/09/2015  . Influenza-Unspecified 11/06/2012  . Pneumococcal Conjugate-13 01/20/2014  . Pneumococcal-Unspecified 02/06/2002, 09/23/2012  . Tdap 03/09/2010  . Zoster 08/03/2014    Last Colon - 01/26/2015 - Dr Havery Moros - adenomatous polyp - recommended 3 yr f/u - due Dec 2019  Past Surgical History:  Procedure Laterality Date  . COLONOSCOPY  05-09-2004   HPP and tics [facility]   . FIBEROPTIC BRONCHOSCOPY  10-28-2008  . LITHOTRIPSY  2012  . LUNG REMOVAL, PARTIAL  2008  . PROSTATE SURGERY     prostatectomy  . RIGHT UPPER LUNG LOBECTOMY  09-21-2006  DR BURNEY   NON-SMALL CELL CANCER/ SEVERE COPD  . TRANSURETHRAL RESECTION OF BLADDER TUMOR  12/25/2011   Procedure: TRANSURETHRAL RESECTION OF BLADDER TUMOR (TURBT);  Surgeon: Franchot Gallo, MD;  Location: [facility];  Service: Urology;  Laterality: N/A;  . TRANSURETHRAL RESECTION OF PROSTATE  12/25/2011   Procedure: TRANSURETHRAL RESECTION OF THE PROSTATE WITH GYRUS INSTRUMENTS;  Surgeon: Franchot Gallo, MD;  Location: [facility];  Service: Urology;  Laterality: N/A;  2 HRS    Family History  Problem Relation Age of Onset  . Cancer Mother        Non-Hodgkin's Lymphoma  . Diabetes Mother   . Hypertension Mother   . Colon polyps Mother   . Cancer Father        Colon cancer w/ metastasis to lung & bone  . Colon cancer Father   . Cancer Brother 25       Lung cancer  . Diabetes Maternal Grandmother   . Heart failure Maternal Grandmother   . Cancer Brother 65       Non-small cell Lung cancer  . Pneumonia Maternal Grandfather   . Esophageal cancer Neg Hx   . Rectal cancer Neg Hx   . Stomach  cancer Neg Hx    Social History   Socioeconomic History  . Marital status: Single    Spouse name: Not on file  . Number of children: Not on file  . Years of education: Not on file  . Highest education level: Not on file  Occupational History  . Not on file  Tobacco Use  . Smoking status: Former Smoker    Packs/day: 1.00    Years: 35.00    Pack years: 35.00    Types: Cigarettes    Last attempt to quit: 02/07/2004  Years since quitting: 14.0  . Smokeless tobacco: Never Used  Substance and Sexual Activity  . Alcohol use: Yes    Alcohol/week: 0.0 standard drinks    Comment: socially  . Drug use: No  . Sexual activity: Not on file    ROS Constitutional: Denies fever, chills, weight loss/gain, headaches, insomnia,  night sweats or change in appetite. Does c/o fatigue. Eyes: Denies redness, blurred vision, diplopia, discharge, itchy or watery eyes.  ENT: Denies discharge, congestion, post nasal drip, epistaxis, sore throat, earache, hearing loss, dental pain, Tinnitus, Vertigo, Sinus pain or snoring.  Cardio: Denies chest pain, palpitations, irregular heartbeat, syncope, dyspnea, diaphoresis, orthopnea, PND, claudication or edema Respiratory: denies cough, dyspnea, DOE, pleurisy, hoarseness, laryngitis or wheezing.  Gastrointestinal: Denies dysphagia, heartburn, reflux, water brash, pain, cramps, nausea, vomiting, bloating, diarrhea, constipation, hematemesis, melena, hematochezia, jaundice or hemorrhoids Genitourinary: Denies dysuria, frequency, urgency, nocturia, hesitancy, discharge, hematuria or flank pain Musculoskeletal: Denies arthralgia, myalgia, stiffness, Jt. Swelling, pain, limp or strain/sprain. Denies Falls. Skin: Denies puritis, rash, hives, warts, acne, eczema or change in skin lesion Neuro: No weakness, tremor, incoordination, spasms, paresthesia or pain Psychiatric: Denies confusion, memory loss or sensory loss. Denies Depression. Endocrine: Denies change in weight,  skin, hair change, nocturia, and paresthesia, diabetic polys, visual blurring or hyper / hypo glycemic episodes.  Heme/Lymph: No excessive bleeding, bruising or enlarged lymph nodes.  Physical Exam  BP 118/66   Pulse 64   Temp (!) 97.5 F (36.4 C)   Ht 5' 3.5" (1.613 m)   Wt 160 lb 12.8 oz (72.9 kg)   SpO2 96%   BMI 28.04 kg/m   General Appearance: Well nourished and well groomed and in no apparent distress.  Eyes: PERRLA, EOMs, conjunctiva no swelling or erythema, normal fundi and vessels. Sinuses: No frontal/maxillary tenderness ENT/Mouth: EACs patent / TMs  nl. Nares clear without erythema, swelling, mucoid exudates. Oral hygiene is good. No erythema, swelling, or exudate. Tongue normal, non-obstructing. Tonsils not swollen or erythematous. Hearing normal.  Neck: Supple, thyroid not palpable. No bruits, nodes or JVD. Respiratory: Respiratory effort normal.  BS equal and clear bilateral without rales, rhonci, wheezing or stridor. Cardio: Heart sounds are normal with regular rate and rhythm and no murmurs, rubs or gallops. Peripheral pulses are normal and equal bilaterally without edema. No aortic or femoral bruits. Chest: symmetric with normal excursions and percussion.  Abdomen: Soft, with Nl bowel sounds. Nontender, no guarding, rebound, hernias, masses, or organomegaly.  Lymphatics: Non tender without lymphadenopathy.  Genitourinary: No hernias.Testes nl. DRE - prostate nl for age - smooth & firm w/o nodules. Musculoskeletal: Full ROM all peripheral extremities, joint stability, 5/5 strength, and normal gait. Skin: Warm and dry without rashes, lesions, cyanosis, clubbing or  ecchymosis.  Neuro: Cranial nerves intact, reflexes equal bilaterally. Normal muscle tone, no cerebellar symptoms. Sensation intact.  Pysch: Alert and oriented X 3 with normal affect, insight and judgment appropriate.   Assessment and Plan  1. Annual Preventative/Screening Exam   2. Essential  hypertension  - EKG 12-Lead - [place], RETROPERITNL ABD,  LTD - Urinalysis, Routine w reflex microscopic - Microalbumin / creatinine urine ratio - CBC with Differential/Platelet - COMPLETE METABOLIC PANEL WITH GFR - Magnesium - TSH - DG Chest 2 View; Future  3. Hyperlipidemia, mixed  - EKG 12-Lead - [place], RETROPERITNL ABD,  LTD - Lipid panel - TSH  4. Abnormal glucose  - EKG 12-Lead - [place], RETROPERITNL ABD,  LTD - Hemoglobin A1c - Insulin, random  5. Vitamin D  deficiency  - VITAMIN D 25 Hydroxyl  6. Prediabetes  - EKG 12-Lead - [place], RETROPERITNL ABD,  LTD - Hemoglobin A1c - Insulin, random  7. History of lung cancer  - DG Chest 2 View; Future  8. Aortic atherosclerosis ([facility])  - EKG 12-Lead - [place], RETROPERITNL ABD,  LTD  9. Chronic obstructive pulmonary disease  ([place])  - DG Chest 2 View; Future  10. History of prostate cancer  - PSA  11. Screening for colorectal cancer  - POC Hemoccult Bld/Stl   12. Screening for ischemic heart disease  - EKG 12-Lead  13. FHx: heart disease  - EKG 12-Lead - [place], RETROPERITNL ABD,  LTD  14. Former smoker  - EKG 12-Lead - [place], RETROPERITNL ABD,  LTD  15. Screening for AAA (aortic abdominal aneurysm)  - [place], RETROPERITNL ABD,  LTD  16. Prostate cancer screening  - PSA  17. Medication management  - Urinalysis, Routine w reflex microscopic - Microalbumin / creatinine urine ratio - CBC with Differential/Platelet - COMPLETE METABOLIC PANEL WITH GFR - Magnesium - Lipid panel - TSH - Hemoglobin A1c - Insulin, random - VITAMIN D 25 Hydroxyl             Patient was counseled in prudent diet, weight control to achieve/maintain BMI less than 25, BP monitoring, regular exercise and medications as discussed.  Discussed med effects and SE's. Routine screening labs and tests as requested with regular follow-up as recommended. Over 40 minutes of exam, counseling, chart review and high complex critical decision making was  performed

## 2018-03-11 ENCOUNTER — Encounter: Payer: Self-pay | Admitting: Internal Medicine

## 2018-03-11 ENCOUNTER — Ambulatory Visit (HOSPITAL_COMMUNITY)
Admission: RE | Admit: 2018-03-11 | Discharge: 2018-03-11 | Disposition: A | Discharge status: Home / Self Care | Payer: PPO | Source: Ambulatory Visit | Attending: Internal Medicine | Admitting: Internal Medicine

## 2018-03-11 ENCOUNTER — Ambulatory Visit (INDEPENDENT_AMBULATORY_CARE_PROVIDER_SITE_OTHER): Payer: PPO | Admitting: Internal Medicine

## 2018-03-11 VITALS — BP 118/66 | HR 64 | Temp 97.5°F | Ht 63.5 in | Wt 160.8 lb

## 2018-03-11 DIAGNOSIS — E782 Mixed hyperlipidemia: Secondary | ICD-10-CM

## 2018-03-11 DIAGNOSIS — I10 Essential (primary) hypertension: Secondary | ICD-10-CM | POA: Insufficient documentation

## 2018-03-11 DIAGNOSIS — Z1211 Encounter for screening for malignant neoplasm of colon: Secondary | ICD-10-CM

## 2018-03-11 DIAGNOSIS — Z8249 Family history of ischemic heart disease and other diseases of the circulatory system: Secondary | ICD-10-CM

## 2018-03-11 DIAGNOSIS — Z85118 Personal history of other malignant neoplasm of bronchus and lung: Secondary | ICD-10-CM

## 2018-03-11 DIAGNOSIS — J449 Chronic obstructive pulmonary disease, unspecified: Secondary | ICD-10-CM | POA: Diagnosis not present

## 2018-03-11 DIAGNOSIS — Z136 Encounter for screening for cardiovascular disorders: Secondary | ICD-10-CM | POA: Diagnosis not present

## 2018-03-11 DIAGNOSIS — R7309 Other abnormal glucose: Secondary | ICD-10-CM | POA: Diagnosis not present

## 2018-03-11 DIAGNOSIS — R7303 Prediabetes: Secondary | ICD-10-CM | POA: Diagnosis not present

## 2018-03-11 DIAGNOSIS — Z87891 Personal history of nicotine dependence: Secondary | ICD-10-CM

## 2018-03-11 DIAGNOSIS — Z0000 Encounter for general adult medical examination without abnormal findings: Secondary | ICD-10-CM | POA: Diagnosis not present

## 2018-03-11 DIAGNOSIS — I700 Atherosclerosis of aorta: Secondary | ICD-10-CM

## 2018-03-11 DIAGNOSIS — Z79899 Other long term (current) drug therapy: Secondary | ICD-10-CM

## 2018-03-11 DIAGNOSIS — Z0001 Encounter for general adult medical examination with abnormal findings: Secondary | ICD-10-CM

## 2018-03-11 DIAGNOSIS — Z125 Encounter for screening for malignant neoplasm of prostate: Secondary | ICD-10-CM

## 2018-03-11 DIAGNOSIS — Z8546 Personal history of malignant neoplasm of prostate: Secondary | ICD-10-CM

## 2018-03-11 DIAGNOSIS — Z1212 Encounter for screening for malignant neoplasm of rectum: Secondary | ICD-10-CM

## 2018-03-11 DIAGNOSIS — E559 Vitamin D deficiency, unspecified: Secondary | ICD-10-CM

## 2018-03-12 ENCOUNTER — Other Ambulatory Visit: Payer: Self-pay | Admitting: Internal Medicine

## 2018-03-12 ENCOUNTER — Encounter (INDEPENDENT_AMBULATORY_CARE_PROVIDER_SITE_OTHER): Payer: PPO | Admitting: Ophthalmology

## 2018-03-12 DIAGNOSIS — I10 Essential (primary) hypertension: Secondary | ICD-10-CM | POA: Diagnosis not present

## 2018-03-12 DIAGNOSIS — H35033 Hypertensive retinopathy, bilateral: Secondary | ICD-10-CM | POA: Diagnosis not present

## 2018-03-12 DIAGNOSIS — H43813 Vitreous degeneration, bilateral: Secondary | ICD-10-CM

## 2018-03-12 DIAGNOSIS — H34812 Central retinal vein occlusion, left eye, with macular edema: Secondary | ICD-10-CM

## 2018-03-12 DIAGNOSIS — E782 Mixed hyperlipidemia: Secondary | ICD-10-CM

## 2018-03-12 LAB — CBC WITH DIFFERENTIAL/PLATELET
Absolute Monocytes: 545 cells/uL (ref 200–950)
Basophils Absolute: 62 cells/uL (ref 0–200)
Basophils Relative: 0.9 %
Eosinophils Absolute: 83 cells/uL (ref 15–500)
Eosinophils Relative: 1.2 %
HCT: 41.9 % (ref 38.5–50.0)
Hemoglobin: 14 g/dL (ref 13.2–17.1)
Lymphs Abs: 3126 cells/uL (ref 850–3900)
MCH: 31 pg (ref 27.0–33.0)
MCHC: 33.4 g/dL (ref 32.0–36.0)
MCV: 92.9 fL (ref 80.0–100.0)
MONOS PCT: 7.9 %
MPV: 10.5 fL (ref 7.5–12.5)
Neutro Abs: 3084 cells/uL (ref 1500–7800)
Neutrophils Relative %: 44.7 %
Platelets: 226 10*3/uL (ref 140–400)
RBC: 4.51 10*6/uL (ref 4.20–5.80)
RDW: 12.6 % (ref 11.0–15.0)
TOTAL LYMPHOCYTE: 45.3 %
WBC: 6.9 10*3/uL (ref 3.8–10.8)

## 2018-03-12 LAB — MICROALBUMIN / CREATININE URINE RATIO
CREATININE, URINE: 82 mg/dL (ref 20–320)
MICROALB UR: 1.4 mg/dL
MICROALB/CREAT RATIO: 17 ug/mg{creat} (ref <30)

## 2018-03-12 LAB — TSH: TSH: 1.34 mIU/L (ref 0.40–4.50)

## 2018-03-12 LAB — COMPLETE METABOLIC PANEL WITH GFR
AG Ratio: 2.3 (calc) (ref 1.0–2.5)
ALT: 16 U/L (ref 9–46)
AST: 19 U/L (ref 10–35)
Albumin: 4.5 g/dL (ref 3.6–5.1)
Alkaline phosphatase (APISO): 78 U/L (ref 35–144)
BUN: 13 mg/dL (ref 7–25)
CALCIUM: 9.6 mg/dL (ref 8.6–10.3)
CO2: 29 mmol/L (ref 20–32)
Chloride: 106 mmol/L (ref 98–110)
Creat: 0.87 mg/dL (ref 0.70–1.11)
GFR, EST NON AFRICAN AMERICAN: 82 mL/min/{1.73_m2} (ref >=60)
GFR, Est African American: 94 mL/min/{1.73_m2} (ref >=60)
GLOBULIN: 2 g/dL (ref 1.9–3.7)
Glucose, Bld: 87 mg/dL (ref 65–99)
Potassium: 4.2 mmol/L (ref 3.5–5.3)
Sodium: 142 mmol/L (ref 135–146)
Total Bilirubin: 0.5 mg/dL (ref 0.2–1.2)
Total Protein: 6.5 g/dL (ref 6.1–8.1)

## 2018-03-12 LAB — HEMOGLOBIN A1C
EAG (MMOL/L): 6.2 (calc)
Hgb A1c MFr Bld: 5.5 % of total Hgb (ref <5.7)
Mean Plasma Glucose: 111 (calc)

## 2018-03-12 LAB — VITAMIN D 25 HYDROXY (VIT D DEFICIENCY, FRACTURES): Vit D, 25-Hydroxy: 71 ng/mL (ref 30–100)

## 2018-03-12 LAB — LIPID PANEL
Cholesterol: 227 mg/dL — ABNORMAL HIGH (ref <200)
HDL: 49 mg/dL (ref >=40)
LDL CHOLESTEROL (CALC): 155 mg/dL — AB
Non-HDL Cholesterol (Calc): 178 mg/dL (calc) — ABNORMAL HIGH (ref <130)
Total CHOL/HDL Ratio: 4.6 (calc) (ref <5.0)
Triglycerides: 110 mg/dL (ref <150)

## 2018-03-12 LAB — URINALYSIS, ROUTINE W REFLEX MICROSCOPIC
Bilirubin Urine: NEGATIVE
Glucose, UA: NEGATIVE
HGB URINE DIPSTICK: NEGATIVE
KETONES UR: NEGATIVE
LEUKOCYTES UA: NEGATIVE
NITRITE: NEGATIVE
PROTEIN: NEGATIVE
Specific Gravity, Urine: 1.012 (ref 1.001–1.03)
pH: 6 (ref 5.0–8.0)

## 2018-03-12 LAB — INSULIN, RANDOM: Insulin: 2.7 u[IU]/mL (ref 2.0–19.6)

## 2018-03-12 LAB — MAGNESIUM: Magnesium: 2.2 mg/dL (ref 1.5–2.5)

## 2018-03-12 LAB — PSA: PSA: 0.7 ng/mL (ref <=4.0)

## 2018-03-12 MED ORDER — PITAVASTATIN CALCIUM 4 MG PO TABS: ORAL_TABLET | ORAL | 3 refills | Status: DC

## 2018-03-13 ENCOUNTER — Other Ambulatory Visit: Payer: Self-pay

## 2018-03-13 DIAGNOSIS — E782 Mixed hyperlipidemia: Secondary | ICD-10-CM

## 2018-03-13 MED ORDER — PITAVASTATIN CALCIUM 4 MG PO TABS: ORAL_TABLET | ORAL | 3 refills | Status: DC

## 2018-03-13 NOTE — Progress Notes (Signed)
Requesting Livalo be sent to [facility] on [address] and not [employer].

## 2018-03-18 ENCOUNTER — Emergency Department (HOSPITAL_COMMUNITY): Payer: PPO

## 2018-03-18 ENCOUNTER — Emergency Department (HOSPITAL_COMMUNITY)
Admission: EM | Admit: 2018-03-18 | Discharge: 2018-03-18 | Disposition: A | Discharge status: Home / Self Care | Payer: PPO | Attending: Emergency Medicine | Admitting: Emergency Medicine

## 2018-03-18 ENCOUNTER — Encounter (HOSPITAL_COMMUNITY): Payer: Self-pay | Admitting: *Deleted

## 2018-03-18 DIAGNOSIS — Z79899 Other long term (current) drug therapy: Secondary | ICD-10-CM | POA: Insufficient documentation

## 2018-03-18 DIAGNOSIS — Z87891 Personal history of nicotine dependence: Secondary | ICD-10-CM | POA: Diagnosis not present

## 2018-03-18 DIAGNOSIS — N201 Calculus of ureter: Secondary | ICD-10-CM | POA: Insufficient documentation

## 2018-03-18 DIAGNOSIS — J449 Chronic obstructive pulmonary disease, unspecified: Secondary | ICD-10-CM | POA: Insufficient documentation

## 2018-03-18 DIAGNOSIS — I10 Essential (primary) hypertension: Secondary | ICD-10-CM | POA: Diagnosis not present

## 2018-03-18 DIAGNOSIS — N200 Calculus of kidney: Secondary | ICD-10-CM | POA: Diagnosis not present

## 2018-03-18 DIAGNOSIS — Z7982 Long term (current) use of aspirin: Secondary | ICD-10-CM | POA: Diagnosis not present

## 2018-03-18 DIAGNOSIS — N132 Hydronephrosis with renal and ureteral calculous obstruction: Secondary | ICD-10-CM | POA: Diagnosis not present

## 2018-03-18 DIAGNOSIS — R1032 Left lower quadrant pain: Secondary | ICD-10-CM | POA: Diagnosis present

## 2018-03-18 LAB — URINALYSIS, ROUTINE W REFLEX MICROSCOPIC
Bilirubin Urine: NEGATIVE
Glucose, UA: NEGATIVE mg/dL
Ketones, ur: 20 mg/dL — AB
LEUKOCYTES UA: NEGATIVE
Nitrite: NEGATIVE
PROTEIN: 30 mg/dL — AB
RBC / HPF: >50 RBC/hpf — ABNORMAL HIGH (ref 0–5)
Specific Gravity, Urine: 1.034 — ABNORMAL HIGH (ref 1.005–1.030)
pH: 5 (ref 5.0–8.0)

## 2018-03-18 LAB — CBC WITH DIFFERENTIAL/PLATELET
Abs Immature Granulocytes: 0.05 10*3/uL (ref 0.00–0.07)
Basophils Absolute: 0.1 10*3/uL (ref 0.0–0.1)
Basophils Relative: 0 %
Eosinophils Absolute: 0 10*3/uL (ref 0.0–0.5)
Eosinophils Relative: 0 %
HCT: 42.3 % (ref 39.0–52.0)
Hemoglobin: 13.9 g/dL (ref 13.0–17.0)
IMMATURE GRANULOCYTES: 0 %
Lymphocytes Relative: 13 %
Lymphs Abs: 1.8 10*3/uL (ref 0.7–4.0)
MCH: 30.6 pg (ref 26.0–34.0)
MCHC: 32.9 g/dL (ref 30.0–36.0)
MCV: 93.2 fL (ref 80.0–100.0)
Monocytes Absolute: 0.6 10*3/uL (ref 0.1–1.0)
Monocytes Relative: 4 %
NEUTROS ABS: 11.4 10*3/uL — AB (ref 1.7–7.7)
NEUTROS PCT: 83 %
Platelets: 212 10*3/uL (ref 150–400)
RBC: 4.54 MIL/uL (ref 4.22–5.81)
RDW: 12.3 % (ref 11.5–15.5)
WBC: 13.9 10*3/uL — ABNORMAL HIGH (ref 4.0–10.5)
nRBC: 0 % (ref 0.0–0.2)

## 2018-03-18 LAB — BASIC METABOLIC PANEL
Anion gap: 11 (ref 5–15)
BUN: 14 mg/dL (ref 8–23)
CO2: 24 mmol/L (ref 22–32)
Calcium: 9.1 mg/dL (ref 8.9–10.3)
Chloride: 106 mmol/L (ref 98–111)
Creatinine, Ser: 1.04 mg/dL (ref 0.61–1.24)
GFR calc Af Amer: >60 mL/min (ref >60)
GFR calc non Af Amer: >60 mL/min (ref >60)
Glucose, Bld: 122 mg/dL — ABNORMAL HIGH (ref 70–99)
Potassium: 3.9 mmol/L (ref 3.5–5.1)
Sodium: 141 mmol/L (ref 135–145)

## 2018-03-18 LAB — POC OCCULT BLOOD, ED: FECAL OCCULT BLD: NEGATIVE

## 2018-03-18 MED ORDER — HYDROCODONE-ACETAMINOPHEN 5-325 MG PO TABS: 1.0000 | ORAL_TABLET | Freq: Four times a day (QID) | ORAL | 0 refills | Status: DC | PRN

## 2018-03-18 MED ORDER — CEPHALEXIN 250 MG PO CAPS: 250.0000 mg | ORAL_CAPSULE | Freq: Four times a day (QID) | ORAL | 0 refills | Status: DC

## 2018-03-18 MED ORDER — FENTANYL CITRATE (PF) 100 MCG/2ML IJ SOLN
50.0000 ug | Freq: Once | INTRAMUSCULAR | Status: AC
  Administered 2018-03-18: 50 ug via INTRAVENOUS
  Filled 2018-03-18: qty 2

## 2018-03-18 MED ORDER — IOHEXOL 300 MG/ML  SOLN
100.0000 mL | Freq: Once | INTRAMUSCULAR | Status: AC | PRN
  Administered 2018-03-18: 100 mL via INTRAVENOUS

## 2018-03-18 MED ORDER — TAMSULOSIN HCL 0.4 MG PO CAPS: 0.4000 mg | ORAL_CAPSULE | Freq: Every day | ORAL | 0 refills | Status: DC

## 2018-03-18 MED ORDER — SODIUM CHLORIDE 0.9 % IV BOLUS
1000.0000 mL | Freq: Once | INTRAVENOUS | Status: AC
  Administered 2018-03-18: 1000 mL via INTRAVENOUS

## 2018-03-18 NOTE — Discharge Instructions (Addendum)
You were seen in the emergency department for some left-sided abdominal pain.  Your CAT scan showed that you have a 9 mm kidney stone on the left.  We reviewed this with urology and they are recommending that you start on some antibiotics, Flomax, and some pain medicine as needed.  They want you to get an appointment this week with Dr. Diona Fanti or 1 of his partners.  If you experience uncontrolled pain, vomiting, or fever you should return to the emergency department-preferentially [facility].

## 2018-03-18 NOTE — ED Triage Notes (Signed)
Pt in c/o constipation for the last few days, has taken a dulcolax at home and used an enema and was able to get small balls of stool out, today unable to have a bowel movement again, denies n/v

## 2018-03-18 NOTE — ED Provider Notes (Signed)
with that [facility] Provider Note   CSN: [id number] Arrival date & time: 03/18/18  1152     History   Chief Complaint Chief Complaint  Patient presents with  . Constipation    HPI Raymond Patrick is a 83 y.o. male.  He is presenting here with left lower quadrant abdominal pain. Difficulty stooling a few days ago.  He tried some oral Dulcolax and ended up giving himself an enema.  Said he had some results with that.  He felt a little constipated again last night and then this morning began having left lower quadrant abdominal pain.  No fevers or chills no nausea vomiting.  No rectal bleeding or vomiting blood.  No chest pain or shortness of breath.  No urinary symptoms. 8/10 pain  The history is provided by the patient.  Abdominal Pain  Pain location:  LLQ Pain quality: cramping   Pain radiates to:  Back Pain severity:  Moderate Onset quality:  Sudden Progression:  Waxing and waning Chronicity:  New Context: not sick contacts, not suspicious food intake and not trauma   Relieved by:  Nothing Worsened by:  Nothing Ineffective treatments:  Bowel activity Associated symptoms: constipation   Associated symptoms: no chest pain, no chills, no cough, no diarrhea, no dysuria, no fever, no hematemesis, no hematochezia, no hematuria, no melena, no shortness of breath, no sore throat and no vomiting     Past Medical History:  Diagnosis Date  . Allergy    seasonal  . Bladder tumor   . Cancer ([place])    bladder,, lung  . COPD (chronic obstructive pulmonary disease) ([place])   . Diverticulosis of sigmoid colon    2016  . History of kidney stones   . History of lung cancer 2008  S/P RIGHT UPPER LOPECTOMY    STAGE I  NON-SMALL CELL LUNG CANCER--  ONCOLOGIST- DR MOHAMED-   NO RECURRENCE  . Hyperlipidemia   . Hypertension   . Impaired hearing bilateral aids  . Irregular heart rate    per pt heart will skip a beat   . Unspecified vitamin D  deficiency     Patient Active Problem List   Diagnosis Date Noted  . Bilateral hearing loss 08/22/2017  . Aortic atherosclerosis ([place]) 02/06/2017  . BPH (benign prostatic hyperplasia) 11/12/2015  . Overweight (BMI 25.0-29.9) 11/03/2014  . Encounter for Medicare annual wellness exam 11/03/2014  . History of lung cancer 01/20/2014  . Other abnormal glucose 03/25/2013  . Hyperlipidemia   . History of smoking   . Hypertension   . Vitamin D deficiency     Past Surgical History:  Procedure Laterality Date  . COLONOSCOPY  05-09-2004   HPP and tics [place]   . FIBEROPTIC BRONCHOSCOPY  10-28-2008  . LITHOTRIPSY  2012  . LUNG REMOVAL, PARTIAL  2008  . PROSTATE SURGERY     prostatectomy  . RIGHT UPPER LUNG LOBECTOMY  09-21-2006  DR BURNEY   NON-SMALL CELL CANCER/ SEVERE COPD  . TRANSURETHRAL RESECTION OF BLADDER TUMOR  12/25/2011   Procedure: TRANSURETHRAL RESECTION OF BLADDER TUMOR (TURBT);  Surgeon: Franchot Gallo, MD;  Location: [facility];  Service: Urology;  Laterality: N/A;  . TRANSURETHRAL RESECTION OF PROSTATE  12/25/2011   Procedure: TRANSURETHRAL RESECTION OF THE PROSTATE WITH GYRUS INSTRUMENTS;  Surgeon: Franchot Gallo, MD;  Location: [facility];  Service: Urology;  Laterality: N/A;  2 HRS         Home Medications  Prior to Admission medications   Medication Sig Start Date End Date Taking? Authorizing Provider  albuterol (PROVENTIL HFA;VENTOLIN HFA) 108 (90 Base) MCG/ACT inhaler Inhale 1-2 puffs into the lungs every 6 (six) hours as needed for wheezing or shortness of breath. Patient not taking: Reported on 11/28/2017 09/06/15   Vicie Mutters, PA-C  aspirin 81 MG tablet Take 81 mg by mouth daily.    [provider]  cetirizine (ZYRTEC) 10 MG tablet Take 1 tablet (10 mg total) by mouth daily. One tab daily for allergies 02/10/15   Rolene Course, PA-C  Cholecalciferol (VITAMIN D) 2000 UNITS CAPS Take 8,000 Units by mouth  daily.     [provider]  glucose blood (FREESTYLE TEST STRIPS) test strip Check blood sugar 1 time daily-DX-R73.03. 08/02/16   [name], MD  glucose monitoring kit (FREESTYLE) monitoring kit Check blood sugar 1 time daily-DX-R73.03. 08/02/16   [name], MD  Lancets (FREESTYLE) lancets Check blood sugar 1 time daily-DX-R73.03. 08/02/16   [name], MD  meloxicam (MOBIC) 15 MG tablet Take 1 tablet (15 mg total) by mouth daily. 04/06/16   Rolene Course, PA-C  Multiple Vitamin (MULTIVITAMIN WITH MINERALS) TABS Take 1 tablet by mouth every evening.     [provider]  Omega-3 Fatty Acids (FISH OIL) 1000 MG CAPS Take 1 capsule by mouth every evening.     [provider]  OVER THE COUNTER MEDICATION Reported on 01/26/2015    [provider]  Pitavastatin Calcium (LIVALO) 4 MG TABS Take 1 tablet daily for Cholesterol 03/13/18   [name], MD  terazosin (HYTRIN) 10 MG capsule TAKE 1 CAPSULE(10 MG) BY MOUTH DAILY 09/09/17   [name], MD  vitamin C (ASCORBIC ACID) 500 MG tablet Take 500 mg by mouth daily.    [provider]    Family History Family History  Problem Relation Age of Onset  . Cancer Mother        Non-Hodgkin's Lymphoma  . Diabetes Mother   . Hypertension Mother   . Colon polyps Mother   . Cancer Father        Colon cancer w/ metastasis to lung & bone  . Colon cancer Father   . Cancer Brother 36       Lung cancer  . Diabetes Maternal Grandmother   . Heart failure Maternal Grandmother   . Cancer Brother 65       Non-small cell Lung cancer  . Pneumonia Maternal Grandfather   . Esophageal cancer Neg Hx   . Rectal cancer Neg Hx   . Stomach cancer Neg Hx     Social History Social History   Tobacco Use  . Smoking status: Former Smoker    Packs/day: 1.00    Years: 35.00    Pack years: 35.00    Types: Cigarettes    Last attempt to quit: 02/07/2004    Years since quitting: 14.1  . Smokeless tobacco:  Never Used  Substance Use Topics  . Alcohol use: Yes    Alcohol/week: 0.0 standard drinks    Comment: socially  . Drug use: No     Allergies   Nitrofuran derivatives   Review of Systems Review of Systems  Constitutional: Negative for chills and fever.  HENT: Negative for sore throat.   Eyes: Negative for visual disturbance.  Respiratory: Negative for cough and shortness of breath.   Cardiovascular: Negative for chest pain.  Gastrointestinal: Positive for abdominal pain and constipation. Negative for diarrhea, hematemesis, hematochezia, melena and  vomiting.  Genitourinary: Negative for dysuria and hematuria.  Musculoskeletal: Positive for back pain. Negative for neck pain.  Skin: Negative for rash.  Neurological: Negative for headaches.     Physical Exam Updated Vital Signs BP (!) 155/73 (BP Location: Left Arm)   Pulse 74   Temp (!) 97.5 F (36.4 C) (Oral)   Resp 16   SpO2 97%   Physical Exam Vitals signs and nursing note reviewed.  Constitutional:      Appearance: He is well-developed.  HENT:     Head: Normocephalic and atraumatic.  Eyes:     Conjunctiva/sclera: Conjunctivae normal.  Neck:     Musculoskeletal: Neck supple.  Cardiovascular:     Rate and Rhythm: Normal rate and regular rhythm.     Heart sounds: No murmur.  Pulmonary:     Effort: Pulmonary effort is normal. No respiratory distress.     Breath sounds: Normal breath sounds.  Abdominal:     Palpations: Abdomen is soft.     Tenderness: There is abdominal tenderness (llq).  Musculoskeletal: Normal range of motion.        General: No signs of injury.     Right lower leg: No edema.     Left lower leg: No edema.  Skin:    General: Skin is warm and dry.     Capillary Refill: Capillary refill takes less than 2 seconds.  Neurological:     General: No focal deficit present.     Mental Status: He is alert.      ED Treatments / Results  Labs (all labs ordered are listed, but only abnormal  results are displayed) Labs Reviewed  BASIC METABOLIC PANEL - Abnormal; Notable for the following components:      Result Value   Glucose, Bld 122 (*)    All other components within normal limits  CBC WITH DIFFERENTIAL/PLATELET - Abnormal; Notable for the following components:   WBC 13.9 (*)    Neutro Abs 11.4 (*)    All other components within normal limits  URINALYSIS, ROUTINE W REFLEX MICROSCOPIC - Abnormal; Notable for the following components:   APPearance HAZY (*)    Specific Gravity, Urine 1.034 (*)    Hgb urine dipstick LARGE (*)    Ketones, ur 20 (*)    Protein, ur 30 (*)    RBC / HPF >50 (*)    Bacteria, UA RARE (*)    All other components within normal limits  URINE CULTURE  POC OCCULT BLOOD, ED    EKG None  Radiology Dg Abdomen 1 View  Result Date: 03/18/2018 CLINICAL DATA:  Left lower quadrant abdominal pain.  Constipation. EXAM: ABDOMEN - 1 VIEW COMPARISON:  03/23/2015 FINDINGS: The bowel gas pattern is normal. Moderate stool in the cecum. No significant stool in the remainder of the colon. No fecal impaction. There is an irregular 5 mm stone in the left side of the pelvis which was not present on the prior exam which could be a stone in the distal ureter. There are multiple phleboliths in the pelvis. Slight lumbar scoliosis. No acute bone abnormality. Benign bone island in the left ilium adjacent to the acetabulum. Aortic atherosclerosis. IMPRESSION: 5 mm irregular stone in the left side of the pelvis which could be a stone in the distal left ureter. Normal bowel gas pattern with no excessive stool. Aortic Atherosclerosis (ICD10-I70.0). Electronically Signed   By: Lorriane Shire M.D.   On: 03/18/2018 12:56   Ct Abdomen Pelvis W Contrast  Result  Date: 03/18/2018 CLINICAL DATA:  Constipation for the last few days.  Abdominal pain. EXAM: CT ABDOMEN AND PELVIS WITH CONTRAST TECHNIQUE: Multidetector CT imaging of the abdomen and pelvis was performed using the standard  protocol following bolus administration of intravenous contrast. CONTRAST:  154m OMNIPAQUE IOHEXOL 300 MG/ML  SOLN COMPARISON:  11/30/2014 FINDINGS: Lower chest: No acute abnormality. Hepatobiliary: No focal liver abnormality is seen. No gallstones, gallbladder wall thickening, or biliary dilatation. Pancreas: Unremarkable. No pancreatic ductal dilatation or surrounding inflammatory changes. Spleen: Normal in size without focal abnormality. Adrenals/Urinary Tract: Normal adrenal glands. 4 mm nonobstructing right renal calculus. No left renal calculus. 9 mm distal left ureteral calculus resulting in severe left hydronephrosis. Left perinephric stranding. Mild bladder wall thickening likely secondary to underdistention. Stomach/Bowel: Stomach is within normal limits. Appendix appears normal. No evidence of bowel wall thickening, distention, or inflammatory changes. Diverticulosis of the sigmoid colon without evidence of diverticulitis. Vascular/Lymphatic: Abdominal aortic atherosclerosis. No lymphadenopathy. Reproductive: Prostate is unremarkable. Other: No abdominal wall hernia or abnormality. No abdominopelvic ascites. Musculoskeletal: Mild osteoarthritis of bilateral sacroiliac joints. No aggressive osseous lesion. No fracture or dislocation. Degenerative disc disease with disc height loss at L2-3, L3-4 and L4-5. Bilateral facet arthropathy throughout the lumbar spine most severe at L5-S1. IMPRESSION: 1. 9 mm distal left ureteral calculus resulting in severe left hydronephrosis. Left perinephric stranding. 2.  Aortic Atherosclerosis (ICD10-I70.0). Electronically Signed   By: HKathreen Devoid  On: 03/18/2018 14:15    Procedures Procedures (including critical care time)  Medications Ordered in ED Medications  sodium chloride 0.9 % bolus 1,000 mL (0 mLs Intravenous Stopped 03/18/18 1453)  fentaNYL (SUBLIMAZE) injection 50 mcg (50 mcg Intravenous Given 03/18/18 1351)  iohexol (OMNIPAQUE) 300 MG/ML solution 100 mL  (100 mLs Intravenous Contrast Given 03/18/18 1357)     Initial Impression / Assessment and Plan / ED Course  I have reviewed the triage vital signs and the nursing notes.  Pertinent labs & imaging results that were available during my care of the patient were reviewed by me and considered in my medical decision making (see chart for details).  Clinical Course as of Mar 18 1720  Mon Mar 18, 2018  1326 Rectal exam done with chaperone present.  No stool in vault.  Normal tone.  No masses appreciated.  Sample sent to lab for guaiac.  Patient states his pain is still an 8 out of 10.  Will order him some pain medication.   [MB]  [id number] resulted and the patient has a 9 mm distal stone on the left with severe hydro-and perinephric stranding.  I reviewed this with the patient.  He still needs to give a urine sample.  He said his urologist is Dr. DDiona Fantiand he is required lithotripsies in the past.  We will put a call into urology once I get the urine back.   [MB]  [id number] had a dose of fentanyl which he said gave him good relief.   [MB]  188[facility].  He recommends adding on a urine culture and sending the patient home with some Keflex and pain medicine and having him call the office for close follow-up with Dr. DDiona Fanti   [MB]    Clinical Course User Index [MB] BHayden Rasmussen MD     Final Clinical Impressions(s) / ED Diagnoses   Final diagnoses:  Ureterolithiasis    ED Discharge Orders         Ordered  tamsulosin (FLOMAX) 0.4 MG CAPS capsule  Daily     03/18/18 1533    HYDROcodone-acetaminophen (NORCO/VICODIN) 5-325 MG tablet  Every 6 hours PRN,   Status:  Discontinued     03/18/18 1533    cephALEXin (KEFLEX) 250 MG capsule  4 times daily     03/18/18 1533    HYDROcodone-acetaminophen (NORCO/VICODIN) 5-325 MG tablet  Every 6 hours PRN     03/18/18 1537           Hayden Rasmussen, MD 03/18/18 1723

## 2018-03-18 NOTE — ED Notes (Signed)
Pt states has been constipated gave himself and enema had good results then this am could not go agagin and gave himself another one having left abd pain and back  back

## 2018-03-18 NOTE — ED Notes (Signed)
Discharge instructions and prescriptions discussed with Pt. Pt verbalized understanding. Pt stable and ambulatory.   

## 2018-03-18 NOTE — ED Notes (Signed)
Back from CT

## 2018-03-19 LAB — URINE CULTURE: Culture: NO GROWTH

## 2018-03-20 DIAGNOSIS — N201 Calculus of ureter: Secondary | ICD-10-CM | POA: Diagnosis not present

## 2018-03-29 ENCOUNTER — Encounter: Payer: Self-pay | Admitting: Gastroenterology

## 2018-04-03 DIAGNOSIS — R300 Dysuria: Secondary | ICD-10-CM | POA: Diagnosis not present

## 2018-04-03 DIAGNOSIS — N202 Calculus of kidney with calculus of ureter: Secondary | ICD-10-CM | POA: Diagnosis not present

## 2018-04-03 DIAGNOSIS — N201 Calculus of ureter: Secondary | ICD-10-CM | POA: Diagnosis not present

## 2018-04-08 ENCOUNTER — Other Ambulatory Visit: Payer: Self-pay | Admitting: Urology

## 2018-04-10 DIAGNOSIS — Z8551 Personal history of malignant neoplasm of bladder: Secondary | ICD-10-CM | POA: Diagnosis not present

## 2018-04-10 DIAGNOSIS — N201 Calculus of ureter: Secondary | ICD-10-CM | POA: Diagnosis not present

## 2018-04-15 ENCOUNTER — Encounter (HOSPITAL_COMMUNITY): Payer: Self-pay | Admitting: *Deleted

## 2018-04-17 NOTE — H&P (Signed)
Office Visit Report     04/10/2018   --------------------------------------------------------------------------------   Raymond Patrick  MRN: [id number]  PRIMARY CARE:  [name], MD  DOB: 03-09-1935, 83 year old Male  REFERRING:    SSN: [id number]  PROVIDER:  Franchot Gallo, M.D.    LOCATION:  [facility] [phone]   --------------------------------------------------------------------------------   CC: I have kidney stones.  HPI: Raymond Patrick is a 83 year-old male established patient who is here for renal calculi.  The problem is on the left side. He first stated noticing pain on 03/18/2018. This is not his first kidney stone. He has had 4 stones prior to getting this one. He is currently having flank pain and back pain. He denies having groin pain, nausea, vomiting, fever, and chills. He has not caught a stone in his urine strainer since his symptoms began.   He has had eswl for treatment of his stones in the past.     CC: I have ureteral stone.  HPI: The problem is on the left side. This is not his first kidney stone. He is not currently having flank pain, back pain, groin pain, nausea, vomiting, fever or chills.   He has a persistent left distal ureteral stone. He is scheduled for lithotripsy in approximately 8 days. No recent fever or chills.     CC: I have bladder cancer that has been treated.  HPI:    Following removal of his Foley catheter at his followup visit in August, 2013, cystoscopy revealed an obstructing prostate but also a 2 cm papillary bladder tumor at the bladder neck. He was not terribly symptomatic from this, but it was recommended that he have this be taken care of following treatment of his symptomatic ureteral stones. He underwent TURBT in addition to a TURP on 12/25/2011. Pathology revealed low-grade urothelial carcinoma with an inverted growth pattern. There was no evidence of invasion, although muscularis propria tissue was not  identified. This was located on the median lobe of the prostate, however.   3.4.2020: He denies recent hematuria or significant voiding problems.     AUA Symptom Score: He never has the sensation of not emptying his bladder completely after finishing urinating. Less than 20% of the time he has to urinate again fewer than two hours after he has finished urinating. He does not have to stop and start again several times when he urinates. He never finds it difficult to postpone urination. He never has a weak urinary stream. He never has to push or strain to begin urination. He never has to get up to urinate from the time he goes to bed until the time he gets up in the morning.   Calculated AUA Symptom Score: 1    ALLERGIES: Nitrofurantoin CAPS - Trouble Breathing    MEDICATIONS: Albuterol Sulfate  Aspirin 81 mg tablet,chewable Oral  Blood Glucose Test Strip  Centrum Silver tablet Oral  Cephalexin 250 mg tablet  Fish Oil CAPS Oral  Livalo 4 mg tablet  Meloxicam 15 mg tablet  Vitamin C  Vitamin D 1,000 unit capsule Oral     GU PSH: Cystoscopy - 11/27/2016, 10/20/2015 Cystoscopy TURBT <2 cm - 2013 Cystoscopy TURP - 2013 ESWL - 2013, 2013, 2012    NON-GU PSH: Lung Surgery (Unspecified) - 2010 Rotator cuff surgery    GU PMH: Renal calculus - 04/03/2018, Nephrolithiasis, - 2017, Bilateral kidney stones, - 2015 Ureteral calculus - 04/03/2018, - 03/20/2018, No recent symptoms of stone, - 11/27/2016, Ureteral  calculus, left, - May 08, 2014, Ureteral Stone, 05-07-12 BPH w/o LUTS (Stable), Excellent voiding following TURP - 11/27/2016 Gross hematuria, Gross hematuria - May 08, 2015 Bladder Cancer Trigone , Malignant neoplasm of trigone of bladder - May 08, 2014 Acute Cystitis/UTI, Acute cystitis without hematuria - 07-May-2013 Bladder Cancer, Unspec, Malignant neoplasm of urinary bladder - May 07, 2013, Bladder cancer, - 05/07/2013 Dysuria, Dysuria - 05-07-2013 Personal Hx Oth male genital organs diseases, History of acute prostatitis -  2013-05-07 Personal Hx Oth Urinary System diseases, History of chronic cystitis - 05/07/2013, History of hematuria, - 2013/05/07 Urinary Retention, Unspec, Urinary retention - 05/07/13 BPH w/LUTS, Benign prostatic hyperplasia with urinary obstruction - 05-07-2012 Urinary Urgency, Urinary urgency - May 07, 2012      PMH Notes:  2011-11-29 18:56:04 - Note: Nephrolithiasis Of The Left Kidney   NON-GU PMH: Encounter for general adult medical examination without abnormal findings, Encounter for preventive health examination - 2015/05/08 Arrhythmia, Irregular heart beat - 05-07-2013 Personal history of other endocrine, nutritional and metabolic disease, History of hypercholesterolemia - 05-07-2012    FAMILY HISTORY: Death In The Family Father - Father Death In The Family Mother - Mother   SOCIAL HISTORY: Marital Status: Married Preferred Language: English; Ethnicity: Not Hispanic Or Latino; Race: White Current Smoking Status: Patient does not smoke anymore. Smoked for 20 years.  Social Drinker.  Drinks 2 caffeinated drinks per day. Patient's occupation is/was Retired.    REVIEW OF SYSTEMS:    GU Review Male:   Patient denies frequent urination, hard to postpone urination, burning/ pain with urination, get up at night to urinate, leakage of urine, stream starts and stops, trouble starting your stream, have to strain to urinate , erection problems, and penile pain.  Gastrointestinal (Upper):   Patient denies nausea, vomiting, and indigestion/ heartburn.  Gastrointestinal (Lower):   Patient denies diarrhea and constipation.  Constitutional:   Patient denies fever, night sweats, weight loss, and fatigue.  Skin:   Patient denies skin rash/ lesion and itching.  Eyes:   Patient denies blurred vision and double vision.  Ears/ Nose/ Throat:   Patient denies sore throat and sinus problems.  Hematologic/Lymphatic:   Patient denies swollen glands and easy bruising.  Cardiovascular:   Patient denies leg swelling and chest pains.  Respiratory:   Patient  denies cough and shortness of breath.  Endocrine:   Patient denies excessive thirst.  Musculoskeletal:   Patient denies back pain and joint pain.  Neurological:   Patient denies headaches and dizziness.  Psychologic:   Patient denies depression and anxiety.   VITAL SIGNS:      04/10/2018 11:00 AM  Weight 155 lb / 70.31 kg  Height 64 in / 162.56 cm  BP 127/76 mmHg  Pulse 75 /min  Temperature 97.6 F / 36.4 C  BMI 26.6 kg/m   GU PHYSICAL EXAMINATION:    Urethral Meatus: Normal size. No lesion, no wart, no discharge, no polyp. Normal location.  Penis: Circumcised, no warts, no cracks. No dorsal Peyronie's plaques, no left corporal Peyronie's plaques, no right corporal Peyronie's plaques, no scarring, no warts. No balanitis, no meatal stenosis.   MULTI-SYSTEM PHYSICAL EXAMINATION:    Constitutional: Well-nourished. No physical deformities. Normally developed. Good grooming.  Neck: Neck symmetrical, not swollen. Normal tracheal position.  Respiratory: No labored breathing, no use of accessory muscles.   Skin: No paleness, no jaundice, no cyanosis. No lesion, no ulcer, no rash.  Neurologic / Psychiatric: Oriented to time, oriented to place, oriented to person. No depression, no anxiety, no agitation.  Eyes: Normal  conjunctivae. Normal eyelids.  Ears, Nose, Mouth, and Throat: Left ear no scars, no lesions, no masses. Right ear no scars, no lesions, no masses. Nose no scars, no lesions, no masses. Normal hearing. Normal lips.  Musculoskeletal: Normal gait and station of head and neck.     PAST DATA REVIEWED:  Source Of History:  Patient  Urine Test Review:   Urinalysis  X-Ray Review: KUB: Reviewed Films. Discussed With Patient.     09/24/12 09/24/11 04/27/10  PSA  Total PSA 0.42  0.17  0.4     PROCEDURES:         Flexible Cystoscopy - 52000  Risks, benefits, and some of the potential complications of the procedure were discussed at length with the patient including infection,  bleeding, voiding discomfort, urinary retention, fever, chills, sepsis, and others. All questions were answered. Informed consent was obtained. Antibiotic prophylaxis was given. Sterile technique and intraurethral analgesia were used.  Meatus:  Normal size. Normal location. Normal condition.  Urethra:  No strictures.  External Sphincter:  Normal.  Verumontanum:  Normal.  Prostate:  Partially resected prostate fossa. Mild prostatic regrowth. Non-obstructing. No hyperplasia.  Bladder Neck:  Non-obstructing.  Ureteral Orifices:  Normal location. Normal size. Normal shape. Effluxed clear urine.  Bladder:  Mild erythema with inflammatory changes around the left ureteral orifice consistent with nearby stone. No other urothelial abnormalities noted.      The lower urinary tract was carefully examined. The procedure was well-tolerated and without complications. Antibiotic instructions were given. Instructions were given to call the office immediately for bloody urine, difficulty urinating, urinary retention, painful or frequent urination, fever, chills, nausea, vomiting or other illness. The patient stated that he understood these instructions and would comply with them.         Urinalysis w/Scope Dipstick Dipstick Cont'd Micro  Color: Yellow Bilirubin: Neg mg/dL WBC/hpf: NS (Not Seen)  Appearance: Clear Ketones: Neg mg/dL RBC/hpf: 0 - 2/hpf  Specific Gravity: 1.010 Blood: 1+ ery/uL Bacteria: NS (Not Seen)  pH: 5.5 Protein: Neg mg/dL Cystals: NS (Not Seen)  Glucose: Neg mg/dL Urobilinogen: 0.2 mg/dL Casts: NS (Not Seen)    Nitrites: Neg Trichomonas: Not Present    Leukocyte Esterase: Neg leu/uL Mucous: Not Present      Epithelial Cells: NS (Not Seen)      Yeast: NS (Not Seen)      Sperm: Not Present    ASSESSMENT:      ICD-10 Details  1 GU:   Ureteral calculus - N20.1 Persistent left distal ureteral stone, schedule for lithotripsy in 1 week  2   History of bladder cancer - Z85.51 No evidence  of recurrence of his bladder cancer   PLAN:           Schedule Return Visit/Planned Activity: 1 Year - Office Visit, Cystoscopy  Return Notes: recheck--get urinalysis in exam room          Document Letter(s):  Created for Patient: Clinical Summary         Notes:   1. We will schedule him for lithotripsy on the 12th of this month   2. I will make sure that he has an appointment back here the in 1 year for cystoscopy   Cc: Dr. Melford Aase         Next Appointment:      Next Appointment: 04/18/2018 08:45 AM    Appointment Type: Surgery     Location: [facility] [phone]    Provider: Festus Aloe, M.D.  Reason for Visit: WL/OP LEFT ESWL      * Signed by Franchot Gallo, M.D. on 04/10/18 at 12:29 PM (EST)*     The information contained in this medical record document is considered private and confidential patient information. This information can only be used for the medical diagnosis and/or medical services that are being provided by the patient's selected caregivers. This information can only be distributed outside of the patient's care if the patient agrees and signs waivers of authorization for this information to be sent to an outside source or route.  Addendum: I reviewed the chart, labs and images.  9 mm left distal stone at the ureterovesical junction.

## 2018-04-18 ENCOUNTER — Encounter (HOSPITAL_COMMUNITY)
Admission: RE | Disposition: A | Discharge status: Home / Self Care | Payer: Self-pay | Source: Home / Self Care | Attending: Urology

## 2018-04-18 ENCOUNTER — Ambulatory Visit (HOSPITAL_COMMUNITY)
Admission: RE | Admit: 2018-04-18 | Discharge: 2018-04-18 | Disposition: A | Discharge status: Home / Self Care | Payer: PPO | Attending: Urology | Admitting: Urology

## 2018-04-18 ENCOUNTER — Ambulatory Visit (HOSPITAL_COMMUNITY): Payer: PPO

## 2018-04-18 ENCOUNTER — Encounter (HOSPITAL_COMMUNITY): Payer: Self-pay | Admitting: General Practice

## 2018-04-18 ENCOUNTER — Other Ambulatory Visit: Payer: Self-pay

## 2018-04-18 DIAGNOSIS — N201 Calculus of ureter: Secondary | ICD-10-CM | POA: Diagnosis not present

## 2018-04-18 DIAGNOSIS — I10 Essential (primary) hypertension: Secondary | ICD-10-CM | POA: Diagnosis not present

## 2018-04-18 DIAGNOSIS — N202 Calculus of kidney with calculus of ureter: Secondary | ICD-10-CM | POA: Diagnosis not present

## 2018-04-18 DIAGNOSIS — Z7982 Long term (current) use of aspirin: Secondary | ICD-10-CM | POA: Diagnosis not present

## 2018-04-18 HISTORY — PX: EXTRACORPOREAL SHOCK WAVE LITHOTRIPSY: SHX1557

## 2018-04-18 SURGERY — LITHOTRIPSY, ESWL
Anesthesia: LOCAL | Laterality: Left

## 2018-04-18 MED ORDER — DIAZEPAM 5 MG PO TABS
10.0000 mg | ORAL_TABLET | ORAL | Status: AC
  Administered 2018-04-18: 10 mg via ORAL
  Filled 2018-04-18: qty 2

## 2018-04-18 MED ORDER — SODIUM CHLORIDE 0.9 % IV SOLN
INTRAVENOUS | Status: DC
  Administered 2018-04-18: 08:00:00 via INTRAVENOUS

## 2018-04-18 MED ORDER — CIPROFLOXACIN HCL 500 MG PO TABS
500.0000 mg | ORAL_TABLET | ORAL | Status: AC
  Administered 2018-04-18: 500 mg via ORAL
  Filled 2018-04-18: qty 1

## 2018-04-18 MED ORDER — DIPHENHYDRAMINE HCL 25 MG PO CAPS
25.0000 mg | ORAL_CAPSULE | ORAL | Status: AC
  Administered 2018-04-18: 25 mg via ORAL
  Filled 2018-04-18: qty 1

## 2018-04-18 NOTE — Interval H&P Note (Signed)
History and Physical Interval Note:  04/18/2018 10:25 AM  Raymond Patrick  has presented today for surgery, with the diagnosis of LEFT URETERAL CALCULUS.  The various methods of treatment have been discussed with the patient and family. After consideration of risks, benefits and other options for treatment, the patient has consented to  Procedure(s): EXTRACORPOREAL SHOCK WAVE LITHOTRIPSY (ESWL) (Left) as a surgical intervention.  The patient's history has been reviewed, patient examined, no change in status, stable for surgery.  I have reviewed the patient's chart and labs.  Questions were answered to the patient's satisfaction.  Delayed entry due to [facility] downtime  -- pt well without dysuria or fever. Stone remains at left UVJ on KUB.    Festus Aloe

## 2018-04-18 NOTE — Op Note (Signed)
Left distal 9 mm stone  Left ESWL  Findings: Stone fragmented well and almost disappeared - only 2000 / 4000 shockwaves required.

## 2018-04-18 NOTE — Discharge Instructions (Signed)
Lithotripsy, Care After This sheet gives you information about how to care for yourself after your procedure. Your health care provider may also give you more specific instructions. If you have problems or questions, contact your health care provider. What can I expect after the procedure? After the procedure, it is common to have:  Some blood in your urine. This should only last for a few days.  Soreness in your back, sides, or upper abdomen for a few days.  Blotches or bruises on your back where the pressure wave entered the skin.  Pain, discomfort, or nausea when pieces (fragments) of the kidney stone move through the tube that carries urine from the kidney to the bladder (ureter). Stone fragments may pass soon after the procedure, but they may continue to pass for up to 4-8 weeks. ? If you have severe pain or nausea, contact your health care provider. This may be caused by a large stone that was not broken up, and this may mean that you need more treatment.  Some pain or discomfort during urination.  Some pain or discomfort in the lower abdomen or (in men) at the base of the penis. Follow these instructions at home: Medicines  Take over-the-counter and prescription medicines only as told by your health care provider.  If you were prescribed an antibiotic medicine, take it as told by your health care provider. Do not stop taking the antibiotic even if you start to feel better.  Do not drive for 24 hours if you were given a medicine to help you relax (sedative).  Do not drive or use heavy machinery while taking prescription pain medicine. Eating and drinking      Drink enough water and fluids to keep your urine clear or pale yellow. This helps any remaining pieces of the stone to pass. It can also help prevent new stones from forming.  Eat plenty of fresh fruits and vegetables.  Follow instructions from your health care provider about eating and drinking restrictions. You may be  instructed: ? To reduce how much salt (sodium) you eat or drink. Check ingredients and nutrition facts on packaged foods and beverages. ? To reduce how much meat you eat.  Eat the recommended amount of calcium for your age and gender. Ask your health care provider how much calcium you should have. General instructions  Get plenty of rest.  Most people can resume normal activities 1-2 days after the procedure. Ask your health care provider what activities are safe for you.  Your health care provider may direct you to lie in a certain position (postural drainage) and tap firmly (percuss) over your kidney area to help stone fragments pass. Follow instructions as told by your health care provider.  If directed, strain all urine through the strainer that was provided by your health care provider. ? Keep all fragments for your health care provider to see. Any stones that are found may be sent to a medical lab for examination. The stone may be as small as a grain of salt.  Keep all follow-up visits as told by your health care provider. This is important. Contact a health care provider if:  You have pain that is severe or does not get better with medicine.  You have nausea that is severe or does not go away.  You have blood in your urine longer than your health care provider told you to expect.  You have more blood in your urine.  You have pain during urination that does  not go away.  You urinate more frequently than usual and this does not go away.  You develop a rash or any other possible signs of an allergic reaction. Get help right away if:  You have severe pain in your back, sides, or upper abdomen.  You have severe pain while urinating.  Your urine is very dark red.  You have blood in your stool (feces).  You cannot pass any urine at all.  You feel a strong urge to urinate after emptying your bladder.  You have a fever or chills.  You develop shortness of breath,  difficulty breathing, or chest pain.  You have severe nausea that leads to persistent vomiting.  You faint. Summary  After this procedure, it is common to have some pain, discomfort, or nausea when pieces (fragments) of the kidney stone move through the tube that carries urine from the kidney to the bladder (ureter). If this pain or nausea is severe, however, you should contact your health care provider.  Most people can resume normal activities 1-2 days after the procedure. Ask your health care provider what activities are safe for you.  Drink enough water and fluids to keep your urine clear or pale yellow. This helps any remaining pieces of the stone to pass, and it can help prevent new stones from forming.  If directed, strain your urine and keep all fragments for your health care provider to see. Fragments or stones may be as small as a grain of salt.  Get help right away if you have severe pain in your back, sides, or upper abdomen or have severe pain while urinating. This information is not intended to replace advice given to you by your health care provider. Make sure you discuss any questions you have with your health care provider. Document Released: 02/12/2007 Document Revised: 07/04/2017 Document Reviewed: 12/15/2015 Elsevier Interactive Patient Education  2019 [employer].

## 2018-04-19 ENCOUNTER — Encounter (HOSPITAL_COMMUNITY): Payer: Self-pay | Admitting: Urology

## 2018-05-01 ENCOUNTER — Encounter (INDEPENDENT_AMBULATORY_CARE_PROVIDER_SITE_OTHER): Payer: PPO | Admitting: Ophthalmology

## 2018-05-02 DIAGNOSIS — N201 Calculus of ureter: Secondary | ICD-10-CM | POA: Diagnosis not present

## 2018-05-07 ENCOUNTER — Other Ambulatory Visit: Payer: Self-pay

## 2018-05-07 ENCOUNTER — Encounter (INDEPENDENT_AMBULATORY_CARE_PROVIDER_SITE_OTHER): Payer: PPO | Admitting: Ophthalmology

## 2018-05-07 DIAGNOSIS — I10 Essential (primary) hypertension: Secondary | ICD-10-CM

## 2018-05-07 DIAGNOSIS — H43813 Vitreous degeneration, bilateral: Secondary | ICD-10-CM

## 2018-05-07 DIAGNOSIS — H2513 Age-related nuclear cataract, bilateral: Secondary | ICD-10-CM | POA: Diagnosis not present

## 2018-05-07 DIAGNOSIS — H34812 Central retinal vein occlusion, left eye, with macular edema: Secondary | ICD-10-CM

## 2018-05-07 DIAGNOSIS — H35033 Hypertensive retinopathy, bilateral: Secondary | ICD-10-CM

## 2018-06-17 NOTE — Progress Notes (Deleted)
MEDICARE ANNUAL WELLNESS VISIT AND FOLLOW UP Assessment:   Diagnoses and all orders for this visit:  Encounter for Medicare annual wellness exam  Aortic atherosclerosis ([place]) Control blood pressure, cholesterol, glucose, increase exercise.   Essential hypertension Continue medications Monitor blood pressure at home; call if consistently over 130/80 Continue DASH diet.   Reminder to go to the ER if any CP, SOB, nausea, dizziness, severe HA, changes vision/speech, left arm numbness and tingling and jaw pain.  Vitamin D deficiency At goal at recent check; continue to recommend supplementation for goal of 70-100 Defer vitamin D level  Other abnormal glucose Recent A1Cs at goal Discussed diet/exercise, weight management  Defer A1C; check CMP  Hyperlipidemia Continue medications: pitavastatin 4 mg  Continue low cholesterol diet and exercise.  Check lipid panel.   Overweight (BMI 25.0-29.9) Long discussion about weight loss, diet, and exercise Recommended diet heavy in fruits and veggies and low in animal meats, cheeses, and dairy products, appropriate calorie intake Discussed appropriate weight for height  Follow up at next visit  Medication management CBC, CMP/GFR  History of smoking  History of lung cancer 10 years without remission; Released by Dr. Earlie Server  Benign prostatic hyperplasia without lower urinary tract symptoms Continue flomax, denies urinary symptoms, released by Dr. Beatrix Fetters    Over 30 minutes of exam, counseling, chart review, and critical decision making was performed  Future Appointments  Date Time Provider [place]  06/18/2018  9:30 AM Liane Comber, NP [facility] None  07/02/2018  7:45 AM Hayden Pedro, MD [facility] None  09/25/2018  9:30 AM [name], MD [facility] None  03/27/2019  9:00 AM [name], MD [facility] None     Plan:   During the course of the visit the patient was educated and counseled about  appropriate screening and preventive services including:    Pneumococcal vaccine   Influenza vaccine  Prevnar 13  Td vaccine  Screening electrocardiogram  Colorectal cancer screening  Diabetes screening  Glaucoma screening  Nutrition counseling    Subjective:  Raymond Patrick is a 83 y.o. male who presents for Medicare Annual Wellness Visit and 3 month follow up for HTN, hyperlipidemia, glucose management, and vitamin D Def.   Patient is s/p RUL lobectomy by VATS  for Primary Lung Cancer in 2008 followed by Dr Julien Nordmann and also is s/p TURP for Prostate Cancer & Bladder cancer in 2013 by Dr Diona Fanti.  He has been released by both and to follow up only if new concerns.   BMI is There is no height or weight on file to calculate BMI., he has been working on diet and exercise. He is very active and aims to get 10000 steps daily.  Wt Readings from Last 3 Encounters:  04/18/18 160 lb (72.6 kg)  03/11/18 160 lb 12.8 oz (72.9 kg)  01/28/18 161 lb 6.4 oz (73.2 kg)   His blood pressure has been controlled at home, today their BP is   He does workout. He denies chest pain, shortness of breath, dizziness.   He is on cholesterol medication (pitavastatin 4 mg every other day) and denies myalgias. His cholesterol is not at goal. The cholesterol last visit was:   Lab Results  Component Value Date   CHOL 227 (H) 03/11/2018   HDL 49 03/11/2018   LDLCALC 155 (H) 03/11/2018   TRIG 110 03/11/2018   CHOLHDL 4.6 03/11/2018   He has been working on diet and exercise for glucose management, and denies foot ulcerations, increased appetite,  nausea, paresthesia of the feet, polydipsia, polyuria, visual disturbances and vomiting. Last A1C in the office was:  Lab Results  Component Value Date   HGBA1C 5.5 03/11/2018   Last GFR Lab Results  Component Value Date   GFRNONAA >60 03/18/2018    Patient is on Vitamin D supplement and at goal at recent check:   Lab Results  Component Value Date    VD25OH 71 03/11/2018      Medication Review: Current Outpatient Medications on File Prior to Visit  Medication Sig Dispense Refill  . albuterol (PROVENTIL HFA;VENTOLIN HFA) 108 (90 Base) MCG/ACT inhaler Inhale 1-2 puffs into the lungs every 6 (six) hours as needed for wheezing or shortness of breath. (Patient not taking: Reported on 11/28/2017) 18 g 2  . aspirin 81 MG tablet Take 81 mg by mouth daily.    . cephALEXin (KEFLEX) 250 MG capsule Take 1 capsule (250 mg total) by mouth 4 (four) times daily. 28 capsule 0  . cetirizine (ZYRTEC) 10 MG tablet Take 1 tablet (10 mg total) by mouth daily. One tab daily for allergies 30 tablet 3  . Cholecalciferol (VITAMIN D) 2000 UNITS CAPS Take 8,000 Units by mouth daily.     Marland Kitchen glucose blood (FREESTYLE TEST STRIPS) test strip Check blood sugar 1 time daily-DX-R73.03. 100 each 1  . glucose monitoring kit (FREESTYLE) monitoring kit Check blood sugar 1 time daily-DX-R73.03. 1 each 0  . HYDROcodone-acetaminophen (NORCO/VICODIN) 5-325 MG tablet Take 1-2 tablets by mouth every 6 (six) hours as needed for severe pain. 15 tablet 0  . Lancets (FREESTYLE) lancets Check blood sugar 1 time daily-DX-R73.03. 100 each 1  . meloxicam (MOBIC) 15 MG tablet Take 1 tablet (15 mg total) by mouth daily. 90 tablet 0  . Multiple Vitamin (MULTIVITAMIN WITH MINERALS) TABS Take 1 tablet by mouth every evening.     . Omega-3 Fatty Acids (FISH OIL) 1000 MG CAPS Take 1 capsule by mouth every evening.     Marland Kitchen OVER THE COUNTER MEDICATION Reported on 01/26/2015    . Pitavastatin Calcium (LIVALO) 4 MG TABS Take 1 tablet daily for Cholesterol 90 tablet 3  . tamsulosin (FLOMAX) 0.4 MG CAPS capsule Take 1 capsule (0.4 mg total) by mouth daily. 30 capsule 0  . terazosin (HYTRIN) 10 MG capsule TAKE 1 CAPSULE(10 MG) BY MOUTH DAILY 90 capsule 3  . vitamin C (ASCORBIC ACID) 500 MG tablet Take 500 mg by mouth daily.     No current facility-administered medications on file prior to visit.      Allergies: Allergies  Allergen Reactions  . Nitrofuran Derivatives Shortness Of Breath    macrobid    Current Problems (verified) has Hyperlipidemia; History of smoking; Hypertension; Vitamin D deficiency; Other abnormal glucose; History of lung cancer; Overweight (BMI 25.0-29.9); Encounter for Medicare annual wellness exam; BPH (benign prostatic hyperplasia); Aortic atherosclerosis ([place]); and Bilateral hearing loss on their problem list.  Screening Tests Immunization History  Administered Date(s) Administered  . Influenza, High Dose Seasonal PF 11/03/2014, 11/02/2016, 11/28/2017  . Influenza,inj,Quad PF,6+ Mos 11/09/2015  . Influenza-Unspecified 11/06/2012  . Pneumococcal Conjugate-13 01/20/2014  . Pneumococcal-Unspecified 02/06/2002, 09/23/2012  . Tdap 03/09/2010  . Zoster 08/03/2014   Preventative care: Last colonoscopy: 2016 Due 01/2018 - ***  Prior vaccinations: TD or Tdap: 2012  Influenza: 2019  Pneumococcal: 2004, 2014 Prevnar13: 2015 Shingles/Zostavax: 2016  Names of Other Physician/Practitioners you currently use: 1. [facility] here for primary care 2. Dr. Delman Cheadle, eye doctor, last visit  2018 ? Left cataract discussion 3. Dr. Derenda Mis , dentist, last visit 2019  Patient Care Team: [name], MD as PCP - General (Internal Medicine) Franchot Gallo, MD as Consulting Physician (Urology) Curt Bears, MD as Consulting Physician (Oncology) Lorretta Harp, MD as Consulting Physician (Cardiology) Inda Castle, MD (Inactive) as Consulting Physician (Gastroenterology) Melissa Noon, [place] as Referring Physician (Optometry)  Surgical: He  has a past surgical history that includes Lung removal, partial (2008); Lithotripsy (2012); Fiberoptic bronchoscopy (10-28-2008); RIGHT UPPER LUNG LOBECTOMY (09-21-2006  DR Arlyce Dice); Transurethral resection of prostate (12/25/2011); Transurethral resection of bladder tumor  (12/25/2011); Prostate surgery; Colonoscopy (05-09-2004); and Extracorporeal shock wave lithotripsy (Left, 04/18/2018). Family His family history includes Cancer in his father and mother; Cancer (age of onset: 66) in his brother; Cancer (age of onset: 29) in his brother; Colon cancer in his father; Colon polyps in his mother; Diabetes in his maternal grandmother and mother; Heart failure in his maternal grandmother; Hypertension in his mother; Pneumonia in his maternal grandfather. Social history  He reports that he quit smoking about 14 years ago. His smoking use included cigarettes. He has a 35.00 pack-year smoking history. He has never used smokeless tobacco. He reports current alcohol use. He reports that he does not use drugs.  MEDICARE WELLNESS OBJECTIVES: Physical activity:   Cardiac risk factors:   Depression/mood screen:   Depression screen [facility] 2/9 03/10/2018  Decreased Interest 0  Down, Depressed, Hopeless 0  PHQ - 2 Score 0    ADLs:  In your present state of health, do you have any difficulty performing the following activities: 04/18/2018 03/10/2018  Hearing? N Y  Comment - bilat hearing aids  Vision? N N  Difficulty concentrating or making decisions? N N  Walking or climbing stairs? N N  Dressing or bathing? N N  Doing errands, shopping? - N  Some recent data might be hidden     Cognitive Testing  Alert? Yes  Normal Appearance?Yes  Oriented to person? Yes  Place? Yes   Time? Yes  Recall of three objects?  Yes  Can perform simple calculations? Yes  Displays appropriate judgment?Yes  Can read the correct time from a watch face?Yes  EOL planning:     Objective:   There were no vitals filed for this visit. There is no height or weight on file to calculate BMI.  General appearance: alert, no distress, WD/WN, male HEENT: normocephalic, sclerae anicteric, TMs pearly, nares patent, no discharge or erythema, pharynx normal Oral cavity: MMM, no lesions Neck: supple, no  lymphadenopathy, no thyromegaly, no masses Heart: RRR, normal S1, S2, no murmurs Lungs: CTA bilaterally, no wheezes, rhonchi, or rales Abdomen: +bs, soft, non tender, non distended, no masses, no hepatomegaly, no splenomegaly Musculoskeletal: nontender, no swelling, no obvious deformity Extremities: no edema, no cyanosis, no clubbing Pulses: 2+ symmetric, upper and lower extremities, normal cap refill Neurological: alert, oriented x 3, CN2-12 intact, strength normal upper extremities and lower extremities, sensation normal throughout, DTRs 2+ throughout, no cerebellar signs, gait normal Psychiatric: normal affect, behavior normal, pleasant   Medicare Attestation I have personally reviewed: The patient's medical and social history Their use of alcohol, tobacco or illicit drugs Their current medications and supplements The patient's functional ability including ADLs,fall risks, home safety risks, cognitive, and hearing and visual impairment Diet and physical activities Evidence for depression or mood disorders  The patient's weight, height, BMI, and visual acuity have been recorded in the chart.  I have made referrals, counseling, and provided  education to the patient based on review of the above and I have provided the patient with a written personalized care plan for preventive services.     Izora Ribas, NP   06/17/2018

## 2018-06-18 ENCOUNTER — Other Ambulatory Visit: Payer: Self-pay

## 2018-06-18 ENCOUNTER — Encounter: Payer: Self-pay | Admitting: Adult Health

## 2018-06-18 ENCOUNTER — Ambulatory Visit: Payer: Self-pay | Admitting: Adult Health

## 2018-06-18 ENCOUNTER — Ambulatory Visit (INDEPENDENT_AMBULATORY_CARE_PROVIDER_SITE_OTHER): Payer: PPO | Admitting: Adult Health

## 2018-06-18 VITALS — BP 120/66 | HR 66 | Temp 97.5°F | Ht 63.5 in | Wt 163.2 lb

## 2018-06-18 DIAGNOSIS — I700 Atherosclerosis of aorta: Secondary | ICD-10-CM

## 2018-06-18 DIAGNOSIS — H900 Conductive hearing loss, bilateral: Secondary | ICD-10-CM

## 2018-06-18 DIAGNOSIS — Z0000 Encounter for general adult medical examination without abnormal findings: Secondary | ICD-10-CM

## 2018-06-18 DIAGNOSIS — R6889 Other general symptoms and signs: Secondary | ICD-10-CM

## 2018-06-18 DIAGNOSIS — N400 Enlarged prostate without lower urinary tract symptoms: Secondary | ICD-10-CM

## 2018-06-18 DIAGNOSIS — I10 Essential (primary) hypertension: Secondary | ICD-10-CM

## 2018-06-18 DIAGNOSIS — Z87891 Personal history of nicotine dependence: Secondary | ICD-10-CM

## 2018-06-18 DIAGNOSIS — E782 Mixed hyperlipidemia: Secondary | ICD-10-CM

## 2018-06-18 DIAGNOSIS — H6121 Impacted cerumen, right ear: Secondary | ICD-10-CM

## 2018-06-18 DIAGNOSIS — Z0001 Encounter for general adult medical examination with abnormal findings: Secondary | ICD-10-CM | POA: Diagnosis not present

## 2018-06-18 DIAGNOSIS — R7309 Other abnormal glucose: Secondary | ICD-10-CM

## 2018-06-18 DIAGNOSIS — E559 Vitamin D deficiency, unspecified: Secondary | ICD-10-CM

## 2018-06-18 DIAGNOSIS — Z85118 Personal history of other malignant neoplasm of bronchus and lung: Secondary | ICD-10-CM

## 2018-06-18 DIAGNOSIS — H6122 Impacted cerumen, left ear: Secondary | ICD-10-CM

## 2018-06-18 DIAGNOSIS — E663 Overweight: Secondary | ICD-10-CM

## 2018-06-18 MED ORDER — ROSUVASTATIN CALCIUM 10 MG PO TABS: 10.0000 mg | ORAL_TABLET | ORAL | 1 refills | Status: DC

## 2018-06-18 NOTE — Progress Notes (Signed)
MEDICARE ANNUAL WELLNESS VISIT AND FOLLOW UP Assessment:   Diagnoses and all orders for this visit:  Encounter for Medicare annual wellness exam  Aortic atherosclerosis ([place]) Control blood pressure, cholesterol, glucose, increase exercise.   Essential hypertension Continue medications Monitor blood pressure at home; call if consistently over 130/80 Continue DASH diet.   Reminder to go to the ER if any CP, SOB, nausea, dizziness, severe HA, changes vision/speech, left arm numbness and tingling and jaw pain.  Vitamin D deficiency At goal at recent check; continue to recommend supplementation for goal of 60-100 Defer vitamin D level  Other abnormal glucose Recent A1Cs at goal Discussed diet/exercise, weight management  Defer A1C; check CMP  Hyperlipidemia Finish pitivastatin, then intiate rosuvastatin 10 mg every other day  Continue low cholesterol diet and exercise.  Check lipid panel.   Overweight (BMI 25.0-29.9) Long discussion about weight loss, diet, and exercise Recommended diet heavy in fruits and veggies and low in animal meats, cheeses, and dairy products, appropriate calorie intake Discussed appropriate weight for height  Follow up at next visit  Medication management CBC, CMP/GFR  History of smoking Has recent CXR;   History of lung cancer S/p VATS RUL lobectomy, 10 years without remission; Released by Dr. Earlie Server  Benign prostatic hyperplasia without lower urinary tract symptoms denies urinary symptoms, followed by Dr. Beatrix Fetters   R ear impacted cerumen - stop using Qtips, irrigation used in the office without complications, use OTC drops/oil at home to prevent reoccurence  Over 30 minutes of exam, counseling, chart review, and critical decision making was performed  Future Appointments  Date Time Provider [place]  07/02/2018  7:45 AM Hayden Pedro, MD [facility] None  09/25/2018  9:30 AM [name], MD [facility] None  03/27/2019  9:00  AM [name], MD [facility] None     Plan:   During the course of the visit the patient was educated and counseled about appropriate screening and preventive services including:    Pneumococcal vaccine   Influenza vaccine  Prevnar 13  Td vaccine  Screening electrocardiogram  Colorectal cancer screening  Diabetes screening  Glaucoma screening  Nutrition counseling    Subjective:  Raymond Patrick is a 83 y.o. male who presents for Medicare Annual Wellness Visit and 3 month follow up for HTN, hyperlipidemia, glucose management, and vitamin D Def.   Patient is s/p RUL lobectomy by VATS  for Primary Lung Cancer in 2008 followed by Dr Julien Nordmann (has since been released) and also is s/p TURP for Prostate Cancer & Bladder cancer in 2013 by Dr Diona Fanti, continues to follow up with him doing annual cysto, recently negative in 03/2018.   BMI is Body mass index is 28.46 kg/m., he has been working on diet and exercise. He is very active and aims to get 10000 steps daily, walks 1-2 hours 3 days a week as well.  Wt Readings from Last 3 Encounters:  06/18/18 163 lb 3.2 oz (74 kg)  04/18/18 160 lb (72.6 kg)  03/11/18 160 lb 12.8 oz (72.9 kg)   His blood pressure has been controlled at home, today their BP is BP: 120/66 He does workout. He denies chest pain, shortness of breath, dizziness.   He is on cholesterol medication (pitavastatin 4 mg daily, he has been on pravastatin and lipitor in the past, reports this new medication is expensive, $80 copay) and denies myalgias. His cholesterol is not at goal. The cholesterol last visit was:   Lab Results  Component Value Date  CHOL 227 (H) 03/11/2018   HDL 49 03/11/2018   LDLCALC 155 (H) 03/11/2018   TRIG 110 03/11/2018   CHOLHDL 4.6 03/11/2018   He has been working on diet and exercise for glucose management, and denies foot ulcerations, increased appetite, nausea, paresthesia of the feet, polydipsia, polyuria, visual disturbances  and vomiting. Last A1C in the office was:  Lab Results  Component Value Date   HGBA1C 5.5 03/11/2018   Last GFR Lab Results  Component Value Date   GFRNONAA >60 03/18/2018    Patient is on Vitamin D supplement and at goal at recent check:   Lab Results  Component Value Date   VD25OH 71 03/11/2018      Medication Review: Current Outpatient Medications on File Prior to Visit  Medication Sig Dispense Refill  . albuterol (PROVENTIL HFA;VENTOLIN HFA) 108 (90 Base) MCG/ACT inhaler Inhale 1-2 puffs into the lungs every 6 (six) hours as needed for wheezing or shortness of breath. 18 g 2  . aspirin 81 MG tablet Take 81 mg by mouth daily.    . cetirizine (ZYRTEC) 10 MG tablet Take 1 tablet (10 mg total) by mouth daily. One tab daily for allergies 30 tablet 3  . Cholecalciferol (VITAMIN D) 2000 UNITS CAPS Take 8,000 Units by mouth daily.     Marland Kitchen glucose blood (FREESTYLE TEST STRIPS) test strip Check blood sugar 1 time daily-DX-R73.03. 100 each 1  . glucose monitoring kit (FREESTYLE) monitoring kit Check blood sugar 1 time daily-DX-R73.03. 1 each 0  . HYDROcodone-acetaminophen (NORCO/VICODIN) 5-325 MG tablet Take 1-2 tablets by mouth every 6 (six) hours as needed for severe pain. 15 tablet 0  . Lancets (FREESTYLE) lancets Check blood sugar 1 time daily-DX-R73.03. 100 each 1  . meloxicam (MOBIC) 15 MG tablet Take 1 tablet (15 mg total) by mouth daily. 90 tablet 0  . Multiple Vitamin (MULTIVITAMIN WITH MINERALS) TABS Take 1 tablet by mouth every evening.     . Omega-3 Fatty Acids (FISH OIL) 1000 MG CAPS Take 1 capsule by mouth every evening.     . Pitavastatin Calcium (LIVALO) 4 MG TABS Take 1 tablet daily for Cholesterol 90 tablet 3  . vitamin C (ASCORBIC ACID) 500 MG tablet Take 500 mg by mouth daily.    . cephALEXin (KEFLEX) 250 MG capsule Take 1 capsule (250 mg total) by mouth 4 (four) times daily. 28 capsule 0  . OVER THE COUNTER MEDICATION Reported on 01/26/2015    . tamsulosin (FLOMAX) 0.4  MG CAPS capsule Take 1 capsule (0.4 mg total) by mouth daily. (Patient not taking: Reported on 06/18/2018) 30 capsule 0  . terazosin (HYTRIN) 10 MG capsule TAKE 1 CAPSULE(10 MG) BY MOUTH DAILY (Patient not taking: Reported on 06/18/2018) 90 capsule 3   No current facility-administered medications on file prior to visit.     Allergies: Allergies  Allergen Reactions  . Nitrofuran Derivatives Shortness Of Breath    macrobid    Current Problems (verified) has Hyperlipidemia; History of smoking; Hypertension; Vitamin D deficiency; Other abnormal glucose; History of lung cancer; Overweight (BMI 25.0-29.9); Encounter for Medicare annual wellness exam; BPH (benign prostatic hyperplasia); Aortic atherosclerosis ([place]); and Bilateral hearing loss on their problem list.  Screening Tests Immunization History  Administered Date(s) Administered  . Influenza, High Dose Seasonal PF 11/03/2014, 11/02/2016, 11/28/2017  . Influenza,inj,Quad PF,6+ Mos 11/09/2015  . Influenza-Unspecified 11/06/2012  . Pneumococcal Conjugate-13 01/20/2014  . Pneumococcal-Unspecified 02/06/2002, 09/23/2012  . Tdap 03/09/2010  . Zoster 08/03/2014   Preventative care: Last  colonoscopy: 2016 Due 01/2018 - deferring at this time due to covid 19, will schedule after  Prior vaccinations: TD or Tdap: 2012  Influenza: 2019  Pneumococcal: 2004, 2014 Prevnar13: 2015 Shingles/Zostavax: 2016  Names of Other Physician/Practitioners you currently use: 1. [facility] here for primary care 2. Dr. Matthews/Gould, eye doctor, last visit 2019, mild Left cataract, has has L macular edema followed by Dr. Zigmund Daniel q7 weeks, doing injections 3. [employer], dentist, last visit 2019, has scheduled today  Patient Care Team: [name], MD as PCP - General (Internal Medicine) Franchot Gallo, MD as Consulting Physician (Urology) Curt Bears, MD as Consulting Physician  (Oncology) Lorretta Harp, MD as Consulting Physician (Cardiology) Inda Castle, MD (Inactive) as Consulting Physician (Gastroenterology) Melissa Noon, [place] as Referring Physician (Optometry)  Surgical: He  has a past surgical history that includes Lung removal, partial (2008); Lithotripsy (2012); Fiberoptic bronchoscopy (10-28-2008); RIGHT UPPER LUNG LOBECTOMY (09-21-2006  DR Arlyce Dice); Transurethral resection of prostate (12/25/2011); Transurethral resection of bladder tumor (12/25/2011); Prostate surgery; Colonoscopy (05-09-2004); and Extracorporeal shock wave lithotripsy (Left, 04/18/2018). Family His family history includes Cancer in his father and mother; Cancer (age of onset: 21) in his brother; Cancer (age of onset: 61) in his brother; Colon cancer in his father; Colon polyps in his mother; Diabetes in his maternal grandmother and mother; Heart failure in his maternal grandmother; Hypertension in his mother; Pneumonia in his maternal grandfather. Social history  He reports that he quit smoking about 14 years ago. His smoking use included cigarettes. He has a 35.00 pack-year smoking history. He has never used smokeless tobacco. He reports current alcohol use. He reports that he does not use drugs.  MEDICARE WELLNESS OBJECTIVES: Physical activity:   Cardiac risk factors:   Depression/mood screen:   Depression screen [facility] 2/9 03/10/2018  Decreased Interest 0  Down, Depressed, Hopeless 0  PHQ - 2 Score 0    ADLs:  In your present state of health, do you have any difficulty performing the following activities: 04/18/2018 03/10/2018  Hearing? N Y  Comment - bilat hearing aids  Vision? N N  Difficulty concentrating or making decisions? N N  Walking or climbing stairs? N N  Dressing or bathing? N N  Doing errands, shopping? - N  Some recent data might be hidden     Cognitive Testing  Alert? Yes  Normal Appearance?Yes  Oriented to person? Yes  Place? Yes   Time? Yes  Recall of three  objects?  Yes  Can perform simple calculations? Yes  Displays appropriate judgment?Yes  Can read the correct time from a watch face?Yes  EOL planning:     Objective:   Today's Vitals   06/18/18 0930  BP: 120/66  Pulse: 66  Temp: (!) 97.5 F (36.4 C)  SpO2: 99%  Weight: 163 lb 3.2 oz (74 kg)  Height: 5' 3.5" (1.613 m)   Body mass index is 28.46 kg/m.  General appearance: alert, no distress, WD/WN, male HEENT: normocephalic, sclerae anicteric, L canal clear,TM pearly, R canal obstructed by dry wax, nares patent, no discharge or erythema, pharynx normal Oral cavity: MMM, no lesions Neck: supple, no lymphadenopathy, no thyromegaly, no masses Heart: RRR, normal S1, S2, no murmurs Lungs: CTA bilaterally, no wheezes, rhonchi, or rales Abdomen: +bs, soft, non tender, non distended, no masses, no hepatomegaly, no splenomegaly Musculoskeletal: nontender, no swelling, no obvious deformity Extremities: no edema, no cyanosis, no clubbing Pulses: 2+ symmetric, upper and lower extremities, normal cap refill  Neurological: alert, oriented x 3, CN2-12 intact, strength normal upper extremities and lower extremities, sensation normal throughout, DTRs 2+ throughout, no cerebellar signs, gait normal Psychiatric: normal affect, behavior normal, pleasant   Medicare Attestation I have personally reviewed: The patient's medical and social history Their use of alcohol, tobacco or illicit drugs Their current medications and supplements The patient's functional ability including ADLs,fall risks, home safety risks, cognitive, and hearing and visual impairment Diet and physical activities Evidence for depression or mood disorders  The patient's weight, height, BMI, and visual acuity have been recorded in the chart.  I have made referrals, counseling, and provided education to the patient based on review of the above and I have provided the patient with a written personalized care plan for preventive  services.     Izora Ribas, NP   06/18/2018

## 2018-06-18 NOTE — Patient Instructions (Addendum)
Raymond Patrick , Thank you for taking time to come for your Medicare Wellness Visit. I appreciate your ongoing commitment to your health goals. Please review the following plan we discussed and let me know if I can assist you in the future.   These are the goals we discussed: Goals    . LDL CALC < 100       This is a list of the screening recommended for you and due dates:  Health Maintenance  Topic Date Due  . Colon Cancer Screening  01/25/2018  . Flu Shot  09/07/2018  . Tetanus Vaccine  03/09/2020  . Pneumonia vaccines  Completed    Schedule follow up for colonoscopy  Discussed with Dr. Melford Aase - ok to stop pitivastatin once you run out, will send in rosuvastatin 10 mg to take EVERY OTHER DAY in the evening   Ear wax daily care Use a dropper or use a cap to put peroxide, olive oil,mineral oil or canola oil in the effected ear- 2-3 times a week. Let it soak for 20-30 min then you can take a shower or use a baby bulb with warm water to wash out the ear wax.  Do not use Qtips    Rosuvastatin Tablets What is this medicine? ROSUVASTATIN (roe SOO va sta tin) is known as a HMG-CoA reductase inhibitor or 'statin'. It lowers cholesterol and triglycerides in the blood. This drug may also reduce the risk of heart attack, stroke, or other health problems in patients with risk factors for heart disease. Diet and lifestyle changes are often used with this drug. This medicine may be used for other purposes; ask your health care provider or pharmacist if you have questions. COMMON BRAND NAME(S): Crestor What should I tell my health care provider before I take this medicine? They need to know if you have any of these conditions: -diabetes -if you often drink alcohol -history of stroke -kidney disease -liver disease -muscle aches or weakness -thyroid disease -an unusual or allergic reaction to rosuvastatin, other medicines, foods, dyes, or preservatives -pregnant or trying to get  pregnant -breast-feeding How should I use this medicine? Take this medicine by mouth with a glass of water. Follow the directions on the prescription label. Do not cut, crush or chew this medicine. You can take this medicine with or without food. Take your doses at regular intervals. Do not take your medicine more often than directed. Talk to your pediatrician regarding the use of this medicine in children. While this drug may be prescribed for children as young as 83 years old for selected conditions, precautions do apply. Overdosage: If you think you have taken too much of this medicine contact a poison control center or emergency room at once. NOTE: This medicine is only for you. Do not share this medicine with others. What if I miss a dose? If you miss a dose, take it as soon as you can. If your next dose is to be taken in less than 12 hours, then do not take the missed dose. Take the next dose at your regular time. Do not take double or extra doses. What may interact with this medicine? Do not take this medicine with any of the following medications: -herbal medicines like red yeast rice This medicine may also interact with the following medications: -alcohol -antacids containing aluminum hydroxide or magnesium hydroxide -cyclosporine -other medicines for high cholesterol -some medicines for HIV infection -warfarin This list may not describe all possible interactions. Give your health care  provider a list of all the medicines, herbs, non-prescription drugs, or dietary supplements you use. Also tell them if you smoke, drink alcohol, or use illegal drugs. Some items may interact with your medicine. What should I watch for while using this medicine? Visit your doctor or health care professional for regular check-ups. You may need regular tests to make sure your liver is working properly. Your health care professional may tell you to stop taking this medicine if you develop muscle problems. If  your muscle problems do not go away after stopping this medicine, contact your health care professional. Do not become pregnant while taking this medicine. Women should inform their health care professional if they wish to become pregnant or think they might be pregnant. There is a potential for serious side effects to an unborn child. Talk to your health care professional or pharmacist for more information. Do not breast-feed an infant while taking this medicine. This medicine may affect blood sugar levels. If you have diabetes, check with your doctor or health care professional before you change your diet or the dose of your diabetic medicine. If you are going to need surgery or other procedure, tell your doctor that you are using this medicine. This drug is only part of a total heart-health program. Your doctor or a dietician can suggest a low-cholesterol and low-fat diet to help. Avoid alcohol and smoking, and keep a proper exercise schedule. This medicine may cause a decrease in Co-Enzyme Q-10. You should make sure that you get enough Co-Enzyme Q-10 while you are taking this medicine. Discuss the foods you eat and the vitamins you take with your health care professional. What side effects may I notice from receiving this medicine? Side effects that you should report to your doctor or health care professional as soon as possible: -allergic reactions like skin rash, itching or hives, swelling of the face, lips, or tongue -dark urine -fever -joint pain -muscle cramps, pain -redness, blistering, peeling or loosening of the skin, including inside the mouth -trouble passing urine or change in the amount of urine -unusually weak or tired -yellowing of the eyes or skin Side effects that usually do not require medical attention (report to your doctor or health care professional if they continue or are bothersome): -constipation -heartburn -nausea -stomach gas, pain, upset This list may not describe  all possible side effects. Call your doctor for medical advice about side effects. You may report side effects to FDA at 1-800-FDA-1088. Where should I keep my medicine? Keep out of the reach of children. Store at room temperature between 20 and 25 degrees C (68 and 77 degrees F). Keep container tightly closed (protect from moisture). Throw away any unused medicine after the expiration date. NOTE: This sheet is a summary. It may not cover all possible information. If you have questions about this medicine, talk to your doctor, pharmacist, or health care provider.  2019 Elsevier/Gold Standard (2016-09-26 12:42:43)

## 2018-06-19 LAB — COMPLETE METABOLIC PANEL WITH GFR
AG Ratio: 2.2 (calc) (ref 1.0–2.5)
ALT: 18 U/L (ref 9–46)
AST: 24 U/L (ref 10–35)
Albumin: 4.4 g/dL (ref 3.6–5.1)
Alkaline phosphatase (APISO): 74 U/L (ref 35–144)
BUN: 14 mg/dL (ref 7–25)
CO2: 28 mmol/L (ref 20–32)
Calcium: 9.4 mg/dL (ref 8.6–10.3)
Chloride: 105 mmol/L (ref 98–110)
Creat: 0.95 mg/dL (ref 0.70–1.11)
GFR, Est African American: 87 mL/min/{1.73_m2} (ref >=60)
GFR, Est Non African American: 75 mL/min/{1.73_m2} (ref >=60)
Globulin: 2 g/dL (calc) (ref 1.9–3.7)
Glucose, Bld: 88 mg/dL (ref 65–99)
Potassium: 4.3 mmol/L (ref 3.5–5.3)
Sodium: 140 mmol/L (ref 135–146)
Total Bilirubin: 0.7 mg/dL (ref 0.2–1.2)
Total Protein: 6.4 g/dL (ref 6.1–8.1)

## 2018-06-19 LAB — CBC WITH DIFFERENTIAL/PLATELET
Absolute Monocytes: 538 cells/uL (ref 200–950)
Basophils Absolute: 78 cells/uL (ref 0–200)
Basophils Relative: 1 %
Eosinophils Absolute: 140 cells/uL (ref 15–500)
Eosinophils Relative: 1.8 %
HCT: 43.3 % (ref 38.5–50.0)
Hemoglobin: 14.9 g/dL (ref 13.2–17.1)
Lymphs Abs: 3806 cells/uL (ref 850–3900)
MCH: 32.1 pg (ref 27.0–33.0)
MCHC: 34.4 g/dL (ref 32.0–36.0)
MCV: 93.3 fL (ref 80.0–100.0)
MPV: 10.9 fL (ref 7.5–12.5)
Monocytes Relative: 6.9 %
Neutro Abs: 3237 cells/uL (ref 1500–7800)
Neutrophils Relative %: 41.5 %
Platelets: 212 10*3/uL (ref 140–400)
RBC: 4.64 10*6/uL (ref 4.20–5.80)
RDW: 12.7 % (ref 11.0–15.0)
Total Lymphocyte: 48.8 %
WBC: 7.8 10*3/uL (ref 3.8–10.8)

## 2018-06-19 LAB — TSH: TSH: 1.17 mIU/L (ref 0.40–4.50)

## 2018-06-19 LAB — LIPID PANEL
Cholesterol: 146 mg/dL (ref <200)
HDL: 43 mg/dL (ref >=40)
LDL Cholesterol (Calc): 79 mg/dL (calc)
Non-HDL Cholesterol (Calc): 103 mg/dL (calc) (ref <130)
Total CHOL/HDL Ratio: 3.4 (calc) (ref <5.0)
Triglycerides: 139 mg/dL (ref <150)

## 2018-07-02 ENCOUNTER — Encounter (INDEPENDENT_AMBULATORY_CARE_PROVIDER_SITE_OTHER): Payer: PPO | Admitting: Ophthalmology

## 2018-07-02 ENCOUNTER — Other Ambulatory Visit: Payer: Self-pay

## 2018-07-02 DIAGNOSIS — H43813 Vitreous degeneration, bilateral: Secondary | ICD-10-CM | POA: Diagnosis not present

## 2018-07-02 DIAGNOSIS — H35033 Hypertensive retinopathy, bilateral: Secondary | ICD-10-CM

## 2018-07-02 DIAGNOSIS — H34812 Central retinal vein occlusion, left eye, with macular edema: Secondary | ICD-10-CM | POA: Diagnosis not present

## 2018-07-02 DIAGNOSIS — H2513 Age-related nuclear cataract, bilateral: Secondary | ICD-10-CM

## 2018-07-02 DIAGNOSIS — I10 Essential (primary) hypertension: Secondary | ICD-10-CM

## 2018-07-09 DIAGNOSIS — H903 Sensorineural hearing loss, bilateral: Secondary | ICD-10-CM | POA: Diagnosis not present

## 2018-08-27 ENCOUNTER — Other Ambulatory Visit: Payer: Self-pay

## 2018-08-27 ENCOUNTER — Encounter (INDEPENDENT_AMBULATORY_CARE_PROVIDER_SITE_OTHER): Payer: PPO | Admitting: Ophthalmology

## 2018-08-27 DIAGNOSIS — H43813 Vitreous degeneration, bilateral: Secondary | ICD-10-CM

## 2018-08-27 DIAGNOSIS — H35033 Hypertensive retinopathy, bilateral: Secondary | ICD-10-CM

## 2018-08-27 DIAGNOSIS — I10 Essential (primary) hypertension: Secondary | ICD-10-CM

## 2018-08-27 DIAGNOSIS — H348122 Central retinal vein occlusion, left eye, stable: Secondary | ICD-10-CM | POA: Diagnosis not present

## 2018-09-25 ENCOUNTER — Other Ambulatory Visit: Payer: Self-pay

## 2018-09-25 ENCOUNTER — Ambulatory Visit (INDEPENDENT_AMBULATORY_CARE_PROVIDER_SITE_OTHER): Payer: PPO | Admitting: Internal Medicine

## 2018-09-25 ENCOUNTER — Encounter: Payer: Self-pay | Admitting: Internal Medicine

## 2018-09-25 VITALS — BP 132/76 | HR 64 | Temp 97.0°F | Resp 16 | Ht 63.5 in | Wt 162.6 lb

## 2018-09-25 DIAGNOSIS — R0989 Other specified symptoms and signs involving the circulatory and respiratory systems: Secondary | ICD-10-CM

## 2018-09-25 DIAGNOSIS — J449 Chronic obstructive pulmonary disease, unspecified: Secondary | ICD-10-CM | POA: Diagnosis not present

## 2018-09-25 DIAGNOSIS — Z79899 Other long term (current) drug therapy: Secondary | ICD-10-CM

## 2018-09-25 DIAGNOSIS — R7303 Prediabetes: Secondary | ICD-10-CM | POA: Diagnosis not present

## 2018-09-25 DIAGNOSIS — E782 Mixed hyperlipidemia: Secondary | ICD-10-CM

## 2018-09-25 DIAGNOSIS — R7309 Other abnormal glucose: Secondary | ICD-10-CM | POA: Diagnosis not present

## 2018-09-25 DIAGNOSIS — E559 Vitamin D deficiency, unspecified: Secondary | ICD-10-CM

## 2018-09-25 DIAGNOSIS — N410 Acute prostatitis: Secondary | ICD-10-CM

## 2018-09-25 MED ORDER — CIPROFLOXACIN HCL 500 MG PO TABS: ORAL_TABLET | ORAL | 0 refills | Status: DC

## 2018-09-25 NOTE — Patient Instructions (Signed)

## 2018-09-25 NOTE — Progress Notes (Signed)
History of Present Illness:      This very nice 83 y.o. single WM presents for 6 month follow up with HTN, HLD, Pre-Diabetes and Vitamin D Deficiency.  Patient was treated for Lung Cancer in 2008 by VATS and in 2013 or Prostate & Bladder Cancers.      Patient is treated for HTN (1998) & BP has been controlled at home. Today's BP is at goal - 132/76.  Normal / Negative Stress Myoview in 2018. Patient has had no complaints of any cardiac type chest pain, palpitations, dyspnea / orthopnea / PND, dizziness, claudication, or dependent edema.      Hyperlipidemia is controlled with diet & meds. Patient denies myalgias or other med SE's. Last Lipids were at goal: Lab Results  Component Value Date   CHOL 146 06/18/2018   HDL 43 06/18/2018   LDLCALC 79 06/18/2018   TRIG 139 06/18/2018   CHOLHDL 3.4 06/18/2018       Also, the patient has history of PreDiabetes (A1c 6.2% / 2011)  and has had no symptoms of reactive hypoglycemia, diabetic polys, paresthesias or visual blurring.  Last A1c was Normal & at goal: Lab Results  Component Value Date   HGBA1C 5.5 03/11/2018       Further, the patient also has history of Vitamin D Deficiency  ("30" / 2008) and supplements vitamin D without any suspected side-effects. Last vitamin D was at goal: Lab Results  Component Value Date   VD25OH 71 03/11/2018   Current Outpatient Medications on File Prior to Visit  Medication Sig  . aspirin 81 MG tablet Take 81 mg by mouth daily.  . cetirizine (ZYRTEC) 10 MG tablet Take 1 tablet (10 mg total) by mouth daily. One tab daily for allergies  . Cholecalciferol (VITAMIN D) 2000 UNITS CAPS Take 8,000 Units by mouth daily.   Marland Kitchen glucose blood (FREESTYLE TEST STRIPS) test strip Check blood sugar 1 time daily-DX-R73.03.  . glucose monitoring kit (FREESTYLE) monitoring kit Check blood sugar 1 time daily-DX-R73.03.  . HYDROcodone-acetaminophen (NORCO/VICODIN) 5-325 MG tablet Take 1-2 tablets by mouth every 6 (six) hours  as needed for severe pain.  . Lancets (FREESTYLE) lancets Check blood sugar 1 time daily-DX-R73.03.  . Multiple Vitamin (MULTIVITAMIN WITH MINERALS) TABS Take 1 tablet by mouth every evening.   . Omega-3 Fatty Acids (FISH OIL) 1000 MG CAPS Take 1 capsule by mouth every evening.   Marland Kitchen OVER THE COUNTER MEDICATION Reported on 01/26/2015  . rosuvastatin (CRESTOR) 10 MG tablet Take 1 tablet (10 mg total) by mouth every other day. Take in the evening.  . vitamin C (ASCORBIC ACID) 500 MG tablet Take 500 mg by mouth daily.  Marland Kitchen albuterol (PROVENTIL HFA;VENTOLIN HFA) 108 (90 Base) MCG/ACT inhaler Inhale 1-2 puffs into the lungs every 6 (six) hours as needed for wheezing or shortness of breath. (Patient not taking: Reported on 09/25/2018)   No current facility-administered medications on file prior to visit.    Allergies  Allergen Reactions  . Nitrofuran Derivatives Shortness Of Breath    macrobid   PMHx:   Past Medical History:  Diagnosis Date  . Allergy    seasonal  . Bladder tumor   . Cancer ([place])    bladder,, lung  . COPD (chronic obstructive pulmonary disease) ([place])   . Diverticulosis of sigmoid colon    2016  . History of kidney stones   . History of lung cancer 2008  S/P RIGHT UPPER LOPECTOMY  STAGE I  NON-SMALL CELL LUNG CANCER--  ONCOLOGIST- DR MOHAMED-   NO RECURRENCE  . Hyperlipidemia   . Hypertension   . Impaired hearing bilateral aids  . Irregular heart rate    per pt heart will skip a beat   . Unspecified vitamin D deficiency    Immunization History  Administered Date(s) Administered  . Influenza, High Dose Seasonal PF 11/03/2014, 11/02/2016, 11/28/2017  . Influenza,inj,Quad PF,6+ Mos 11/09/2015  . Influenza-Unspecified 11/06/2012  . Pneumococcal Conjugate-13 01/20/2014  . Pneumococcal-Unspecified 02/06/2002, 09/23/2012  . Tdap 03/09/2010  . Zoster 08/03/2014   Past Surgical History:  Procedure Laterality Date  . COLONOSCOPY  05-09-2004   HPP and tics [place]   .  EXTRACORPOREAL SHOCK WAVE LITHOTRIPSY Left 04/18/2018   Procedure: EXTRACORPOREAL SHOCK WAVE LITHOTRIPSY (ESWL);  Surgeon: Festus Aloe, MD;  Location: [facility];  Service: Urology;  Laterality: Left;  . FIBEROPTIC BRONCHOSCOPY  10-28-2008  . LITHOTRIPSY  2012  . LUNG REMOVAL, PARTIAL  2008  . PROSTATE SURGERY     prostatectomy  . RIGHT UPPER LUNG LOBECTOMY  09-21-2006  DR BURNEY   NON-SMALL CELL CANCER/ SEVERE COPD  . TRANSURETHRAL RESECTION OF BLADDER TUMOR  12/25/2011   Procedure: TRANSURETHRAL RESECTION OF BLADDER TUMOR (TURBT);  Surgeon: Franchot Gallo, MD;  Location: [facility];  Service: Urology;  Laterality: N/A;  . TRANSURETHRAL RESECTION OF PROSTATE  12/25/2011   Procedure: TRANSURETHRAL RESECTION OF THE PROSTATE WITH GYRUS INSTRUMENTS;  Surgeon: Franchot Gallo, MD;  Location: [facility];  Service: Urology;  Laterality: N/A;  2 HRS    FHx:    Reviewed / unchanged  SHx:    Reviewed / unchanged   Systems Review:  Constitutional: Denies fever, chills, wt changes, headaches, insomnia, fatigue, night sweats, change in appetite. Eyes: Denies redness, blurred vision, diplopia, discharge, itchy, watery eyes.  ENT: Denies discharge, congestion, post nasal drip, epistaxis, sore throat, earache, hearing loss, dental pain, tinnitus, vertigo, sinus pain, snoring.  CV: Denies chest pain, palpitations, irregular heartbeat, syncope, dyspnea, diaphoresis, orthopnea, PND, claudication or edema. Respiratory: denies cough, dyspnea, DOE, pleurisy, hoarseness, laryngitis, wheezing.  Gastrointestinal: Denies dysphagia, odynophagia, heartburn, reflux, water brash, abdominal pain or cramps, nausea, vomiting, bloating, diarrhea, constipation, hematemesis, melena, hematochezia  or hemorrhoids. Genitourinary: Denies dysuria, frequency, urgency, nocturia, hesitancy, discharge, hematuria or flank pain. Musculoskeletal: Denies arthralgias, myalgias, stiffness, jt.  swelling, pain, limping or strain/sprain.  Skin: Denies pruritus, rash, hives, warts, acne, eczema or change in skin lesion(s). Neuro: No weakness, tremor, incoordination, spasms, paresthesia or pain. Psychiatric: Denies confusion, memory loss or sensory loss. Endo: Denies change in weight, skin or hair change.  Heme/Lymph: No excessive bleeding, bruising or enlarged lymph nodes.  Physical Exam  BP 132/76   Pulse 64   Temp (!) 97 F (36.1 C)   Resp 16   Ht 5' 3.5" (1.613 m)   Wt 162 lb 9.6 oz (73.8 kg)   BMI 28.35 kg/m   Appears  well nourished, well groomed  and in no distress.  Eyes: PERRLA, EOMs, conjunctiva no swelling or erythema. Sinuses: No frontal/maxillary tenderness ENT/Mouth: EAC's clear, TM's nl w/o erythema, bulging. Nares clear w/o erythema, swelling, exudates. Oropharynx clear without erythema or exudates. Oral hygiene is good. Tongue normal, non obstructing. Hearing intact.  Neck: Supple. Thyroid not palpable. Car 2+/2+ without bruits, nodes or JVD. Chest: Respirations nl with BS clear & equal w/o rales, rhonchi, wheezing or stridor.  Cor: Heart sounds normal w/ regular rate and rhythm without sig. murmurs,  gallops, clicks or rubs. Peripheral pulses normal and equal  without edema.  Abdomen: Soft & bowel sounds normal. Non-tender w/o guarding, rebound, hernias, masses or organomegaly.  Lymphatics: Unremarkable.  Musculoskeletal: Full ROM all peripheral extremities, joint stability, 5/5 strength and normal gait.  Skin: Warm, dry without exposed rashes, lesions or ecchymosis apparent.  Neuro: Cranial nerves intact, reflexes equal bilaterally. Sensory-motor testing grossly intact. Tendon reflexes grossly intact.  Pysch: Alert & oriented x 3.  Insight and judgement nl & appropriate. No ideations.  Assessment and Plan:  1. Labile hypertension  - Continue medication, monitor blood pressure at home.  - Continue DASH diet.  Reminder to go to the ER if any CP,  SOB,  nausea, dizziness, severe HA, changes vision/speech.  - CBC with Differential/Platelet - COMPLETE METABOLIC PANEL WITH GFR - Magnesium - TSH  2. Hyperlipidemia, mixed  - Continue diet/meds, exercise,& lifestyle modifications.  - Continue monitor periodic cholesterol/liver & renal functions   - Lipid panel - TSH  3. Abnormal glucose  - Continue diet, exercise  - Lifestyle modifications.  - Monitor appropriate labs.  - Hemoglobin A1c - Insulin, random  4. Vitamin D deficiency  - Continue supplementation.  - VITAMIN D 25 Hydroxyl  5. Prediabetes  - Continue diet, exercise  - Lifestyle modifications.  - Monitor appropriate labs.  - Hemoglobin A1c - Insulin, random  6. Chronic obstructive pulmonary disease ([place])  7. Medication management  - CBC with Differential/Platelet - COMPLETE METABOLIC PANEL WITH GFR - Magnesium - Lipid panel - TSH - Hemoglobin A1c - Insulin, random - VITAMIN D 25 Hydroxyl        Discussed  regular exercise, BP monitoring, weight control to achieve/maintain BMI less than 25 and discussed med and SE's. Recommended labs to assess and monitor clinical status with further disposition pending results of labs.  I discussed the assessment and treatment plan with the patient. The patient was provided an opportunity to ask questions and all were answered. The patient agreed with the plan and demonstrated an understanding of the instructions.  I provided over 30 minutes of exam, counseling, chart review and  complex critical decision making.  Kirtland Bouchard, MD

## 2018-09-26 LAB — CBC WITH DIFFERENTIAL/PLATELET
Absolute Monocytes: 606 cells/uL (ref 200–950)
Basophils Absolute: 58 cells/uL (ref 0–200)
Basophils Relative: 0.7 %
Eosinophils Absolute: 141 cells/uL (ref 15–500)
Eosinophils Relative: 1.7 %
HCT: 42.2 % (ref 38.5–50.0)
Hemoglobin: 14.3 g/dL (ref 13.2–17.1)
Lymphs Abs: 3835 cells/uL (ref 850–3900)
MCH: 31.9 pg (ref 27.0–33.0)
MCHC: 33.9 g/dL (ref 32.0–36.0)
MCV: 94.2 fL (ref 80.0–100.0)
MPV: 10.9 fL (ref 7.5–12.5)
Monocytes Relative: 7.3 %
Neutro Abs: 3660 cells/uL (ref 1500–7800)
Neutrophils Relative %: 44.1 %
Platelets: 210 10*3/uL (ref 140–400)
RBC: 4.48 10*6/uL (ref 4.20–5.80)
RDW: 12.6 % (ref 11.0–15.0)
Total Lymphocyte: 46.2 %
WBC: 8.3 10*3/uL (ref 3.8–10.8)

## 2018-09-26 LAB — URINE CULTURE
MICRO NUMBER:: 789367
SPECIMEN QUALITY:: ADEQUATE

## 2018-09-26 LAB — HEMOGLOBIN A1C
Hgb A1c MFr Bld: 5.5 % of total Hgb (ref <5.7)
Mean Plasma Glucose: 111 (calc)
eAG (mmol/L): 6.2 (calc)

## 2018-09-26 LAB — LIPID PANEL
Cholesterol: 166 mg/dL (ref <200)
HDL: 49 mg/dL (ref >=40)
LDL Cholesterol (Calc): 96 mg/dL (calc)
Non-HDL Cholesterol (Calc): 117 mg/dL (calc) (ref <130)
Total CHOL/HDL Ratio: 3.4 (calc) (ref <5.0)
Triglycerides: 111 mg/dL (ref <150)

## 2018-09-26 LAB — URINALYSIS, ROUTINE W REFLEX MICROSCOPIC
Bacteria, UA: NONE SEEN /HPF
Bilirubin Urine: NEGATIVE
Glucose, UA: NEGATIVE
Hgb urine dipstick: NEGATIVE
Hyaline Cast: NONE SEEN /LPF
Ketones, ur: NEGATIVE
Nitrite: NEGATIVE
Protein, ur: NEGATIVE
RBC / HPF: NONE SEEN /HPF (ref 0–2)
Specific Gravity, Urine: 1.008 (ref 1.001–1.03)
Squamous Epithelial / HPF: NONE SEEN /HPF (ref <=5)
WBC, UA: NONE SEEN /HPF (ref 0–5)
pH: 7.5 (ref 5.0–8.0)

## 2018-09-26 LAB — COMPLETE METABOLIC PANEL WITH GFR
AG Ratio: 1.9 (calc) (ref 1.0–2.5)
ALT: 21 U/L (ref 9–46)
AST: 24 U/L (ref 10–35)
Albumin: 4.3 g/dL (ref 3.6–5.1)
Alkaline phosphatase (APISO): 78 U/L (ref 35–144)
BUN: 14 mg/dL (ref 7–25)
CO2: 27 mmol/L (ref 20–32)
Calcium: 9.8 mg/dL (ref 8.6–10.3)
Chloride: 105 mmol/L (ref 98–110)
Creat: 0.89 mg/dL (ref 0.70–1.11)
GFR, Est African American: 94 mL/min/{1.73_m2} (ref >=60)
GFR, Est Non African American: 81 mL/min/{1.73_m2} (ref >=60)
Globulin: 2.3 g/dL (calc) (ref 1.9–3.7)
Glucose, Bld: 93 mg/dL (ref 65–99)
Potassium: 4.3 mmol/L (ref 3.5–5.3)
Sodium: 141 mmol/L (ref 135–146)
Total Bilirubin: 0.6 mg/dL (ref 0.2–1.2)
Total Protein: 6.6 g/dL (ref 6.1–8.1)

## 2018-09-26 LAB — VITAMIN D 25 HYDROXY (VIT D DEFICIENCY, FRACTURES): Vit D, 25-Hydroxy: 69 ng/mL (ref 30–100)

## 2018-09-26 LAB — TSH: TSH: 1.14 mIU/L (ref 0.40–4.50)

## 2018-09-26 LAB — INSULIN, RANDOM: Insulin: 4.8 u[IU]/mL

## 2018-09-26 LAB — MAGNESIUM: Magnesium: 2.3 mg/dL (ref 1.5–2.5)

## 2018-09-28 ENCOUNTER — Encounter: Payer: Self-pay | Admitting: Internal Medicine

## 2018-11-05 ENCOUNTER — Encounter: Payer: Self-pay | Admitting: Gastroenterology

## 2018-11-05 ENCOUNTER — Encounter (INDEPENDENT_AMBULATORY_CARE_PROVIDER_SITE_OTHER): Payer: PPO | Admitting: Ophthalmology

## 2018-11-05 ENCOUNTER — Telehealth: Payer: Self-pay | Admitting: Gastroenterology

## 2018-11-05 ENCOUNTER — Other Ambulatory Visit: Payer: Self-pay

## 2018-11-05 DIAGNOSIS — H34812 Central retinal vein occlusion, left eye, with macular edema: Secondary | ICD-10-CM | POA: Diagnosis not present

## 2018-11-05 DIAGNOSIS — I10 Essential (primary) hypertension: Secondary | ICD-10-CM

## 2018-11-05 DIAGNOSIS — H35033 Hypertensive retinopathy, bilateral: Secondary | ICD-10-CM

## 2018-11-05 DIAGNOSIS — H2513 Age-related nuclear cataract, bilateral: Secondary | ICD-10-CM

## 2018-11-05 DIAGNOSIS — H43813 Vitreous degeneration, bilateral: Secondary | ICD-10-CM | POA: Diagnosis not present

## 2018-11-05 NOTE — Telephone Encounter (Signed)
Called patient back and he has only had very small amts of stool for 1 week. He has taken Miralax once a day and ducolax PO and 1 suppository. Wanted to know what else to do. I suggested Miralax 3 times a day for a couple of days, or until he has a soft formed stool

## 2018-11-06 ENCOUNTER — Other Ambulatory Visit: Payer: Self-pay

## 2018-11-06 ENCOUNTER — Ambulatory Visit: Payer: PPO | Admitting: Cardiovascular Disease

## 2018-11-06 ENCOUNTER — Encounter: Payer: Self-pay | Admitting: Cardiovascular Disease

## 2018-11-06 VITALS — BP 118/70 | HR 71 | Temp 95.5°F | Ht 63.0 in | Wt 160.0 lb

## 2018-11-06 DIAGNOSIS — Z008 Encounter for other general examination: Secondary | ICD-10-CM

## 2018-11-06 DIAGNOSIS — E782 Mixed hyperlipidemia: Secondary | ICD-10-CM | POA: Diagnosis not present

## 2018-11-06 NOTE — Progress Notes (Signed)
11/06/2018 Raymond Patrick   1936/02/06  [id number]  Primary Physician [name], MD Primary Cardiologist: Lorretta Harp MD Garret Reddish, [place], [place]  HPI:  Raymond Patrick is a 83 y.o.  mildly overweight single Caucasian male with no children referred by Dr. Melford Aase for cardiovascular evaluation. I last saw him in the office 09/29/2015.  He lives alone. He is retired. 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 His cardiovascular risk factor profile from October 4 50 pack years of tobacco abuse having quit 10 years ago. He has treated hypertension and hyperlipidemia. There is no family history of heart disease. He is never have a heart attack or stroke. Does get mild dyspnea. His past medical history otherwise is remarkable for having undergone a VATS procedure by Dr. Baldemar Friday for what turned out to be malignancy. Dr. Earlie Server is his oncologist. He has had no recurrence.   Did have a negative Myoview stress test performed 11/09/2015 for atypical chest pain.  Since I saw him 3 years ago he is remained stable.  He denies chest pain or shortness of breath.  He is active.  He has sheltering in place.  Current Meds  Medication Sig  . albuterol (PROVENTIL HFA;VENTOLIN HFA) 108 (90 Base) MCG/ACT inhaler Inhale 1-2 puffs into the lungs every 6 (six) hours as needed for wheezing or shortness of breath.  Marland Kitchen aspirin 81 MG tablet Take 81 mg by mouth daily.  . cetirizine (ZYRTEC) 10 MG tablet Take 1 tablet (10 mg total) by mouth daily. One tab daily for allergies  . Cholecalciferol (VITAMIN D) 2000 UNITS CAPS Take 8,000 Units by mouth daily.   Marland Kitchen glucose blood (FREESTYLE TEST STRIPS) test strip Check blood sugar 1 time daily-DX-R73.03.  . glucose monitoring kit (FREESTYLE) monitoring kit Check blood sugar 1 time daily-DX-R73.03.  Marland Kitchen Lancets (FREESTYLE) lancets Check blood sugar 1 time daily-DX-R73.03.  . Multiple Vitamin (MULTIVITAMIN WITH MINERALS) TABS Take 1 tablet by mouth  every evening.   . Omega-3 Fatty Acids (FISH OIL) 1000 MG CAPS Take 1 capsule by mouth every evening.   Marland Kitchen OVER THE COUNTER MEDICATION Reported on 01/26/2015  . rosuvastatin (CRESTOR) 10 MG tablet Take 10 mg by mouth daily.  . vitamin C (ASCORBIC ACID) 500 MG tablet Take 500 mg by mouth daily.     Allergies  Allergen Reactions  . Nitrofuran Derivatives Shortness Of Breath    macrobid    Social History   Socioeconomic History  . Marital status: Single    Spouse name: Not on file  . Number of children: Not on file  . Years of education: Not on file  . Highest education level: Not on file  Occupational History  . Not on file  Social Needs  . Financial resource strain: Not on file  . Food insecurity    Worry: Not on file    Inability: Not on file  . Transportation needs    Medical: Not on file    Non-medical: Not on file  Tobacco Use  . Smoking status: Former Smoker    Packs/day: 1.00    Years: 35.00    Pack years: 35.00    Types: Cigarettes    Quit date: 02/07/2004    Years since quitting: 14.7  . Smokeless tobacco: Never Used  Substance and Sexual Activity  . Alcohol use: Yes    Alcohol/week: 0.0 standard drinks    Comment: socially  . Drug use: No  .  Sexual activity: Not on file  Lifestyle  . Physical activity    Days per week: Not on file    Minutes per session: Not on file  . Stress: Not on file  Relationships  . Social Herbalist on phone: Not on file    Gets together: Not on file    Attends religious service: Not on file    Active member of club or organization: Not on file    Attends meetings of clubs or organizations: Not on file    Relationship status: Not on file  . Intimate partner violence    Fear of current or ex partner: Not on file    Emotionally abused: Not on file    Physically abused: Not on file    Forced sexual activity: Not on file  Other Topics Concern  . Not on file  Social History Narrative  . Not on file     Review of  Systems: General: negative for chills, fever, night sweats or weight changes.  Cardiovascular: negative for chest pain, dyspnea on exertion, edema, orthopnea, palpitations, paroxysmal nocturnal dyspnea or shortness of breath Dermatological: negative for rash Respiratory: negative for cough or wheezing Urologic: negative for hematuria Abdominal: negative for nausea, vomiting, diarrhea, bright red blood per rectum, melena, or hematemesis Neurologic: negative for visual changes, syncope, or dizziness All other systems reviewed and are otherwise negative except as noted above.    Blood pressure 118/70, pulse 71, temperature (!) 95.5 F (35.3 C), height '5\' 3"'  (1.6 m), weight 160 lb (72.6 kg).  General appearance: alert and no distress Neck: no adenopathy, no carotid bruit, no JVD, supple, symmetrical, trachea midline and thyroid not enlarged, symmetric, no tenderness/mass/nodules Lungs: clear to auscultation bilaterally Heart: regular rate and rhythm, S1, S2 normal, no murmur, click, rub or gallop Extremities: extremities normal, atraumatic, no cyanosis or edema Pulses: 2+ and symmetric Skin: Skin color, texture, turgor normal. No rashes or lesions Neurologic: Alert and oriented X 3, normal strength and tone. Normal symmetric reflexes. Normal coordination and gait  EKG normal sinus rhythm at 71 with incomplete right bundle branch block, low limb voltage.  I personally reviewed this EKG.  ASSESSMENT AND PLAN:   Hyperlipidemia History of hyperlipidemia on statin therapy with lipid profile performed 09/25/2018 revealing total cholesterol 166, LDL 96 and HDL 49.  Hypertension History of essential hypertension with blood pressure measured at 118/70.  He is not antihypertensive medications.      Lorretta Harp MD FACP,FACC,FAHA, [facility] 11/06/2018 9:18 AM

## 2018-11-06 NOTE — Assessment & Plan Note (Signed)
History of essential hypertension with blood pressure measured at 118/70.  He is not antihypertensive medications.

## 2018-11-06 NOTE — Assessment & Plan Note (Signed)
History of hyperlipidemia on statin therapy with lipid profile performed 09/25/2018 revealing total cholesterol 166, LDL 96 and HDL 49.

## 2018-11-06 NOTE — Patient Instructions (Signed)
Medication Instructions:  none If you need a refill on your cardiac medications before your next appointment, please call your pharmacy.   Lab work: none If you have labs (blood work) drawn today and your tests are completely normal, you will receive your results only by: Marland Kitchen MyChart Message (if you have MyChart) OR . A paper copy in the mail If you have any lab test that is abnormal or we need to change your treatment, we will call you to review the results.  Testing/Procedures: none  Follow-Up: At [facility], you and your health needs are our priority.  As part of our continuing mission to provide you with exceptional heart care, we have created designated Provider Care Teams.  These Care Teams include your primary Cardiologist (physician) and Advanced Practice Providers (APPs -  Physician Assistants and Nurse Practitioners) who all work together to provide you with the care you need, when you need it. You will need a follow up appointment in 12 months with Dr. Quay Burow.  Please call our office 2 months in advance to schedule this/each appointment.

## 2018-11-14 ENCOUNTER — Ambulatory Visit: Payer: PPO | Admitting: Physician Assistant

## 2018-11-14 ENCOUNTER — Encounter: Payer: Self-pay | Admitting: Physician Assistant

## 2018-11-14 ENCOUNTER — Other Ambulatory Visit: Payer: Self-pay

## 2018-11-14 VITALS — BP 148/80 | HR 72 | Temp 97.8°F | Ht 63.5 in | Wt 163.0 lb

## 2018-11-14 DIAGNOSIS — Z86010 Personal history of colonic polyps: Secondary | ICD-10-CM | POA: Diagnosis not present

## 2018-11-14 MED ORDER — SUPREP BOWEL PREP KIT 17.5-3.13-1.6 GM/177ML PO SOLN: 1.0000 | ORAL | 0 refills | Status: DC

## 2018-11-14 NOTE — Patient Instructions (Signed)
If you are age 81 or older, your body mass index should be between 23-30. Your Body mass index is 28.42 kg/m. If this is out of the aforementioned range listed, please consider follow up with your Primary Care Provider.  If you are age 46 or younger, your body mass index should be between 19-25. Your Body mass index is 28.42 kg/m. If this is out of the aformentioned range listed, please consider follow up with your Primary Care Provider.    You have been scheduled for a colonoscopy. Please follow written instructions given to you at your visit today.  Please pick up your prep supplies at the pharmacy within the next 1-3 days. If you use inhalers (even only as needed), please bring them with you on the day of your procedure.  Thank you for choosing me and [facility].  Dennison Bulla

## 2018-11-14 NOTE — Progress Notes (Signed)
Chief Complaint: History of adenomatous polyps  HPI:    [name] is an 83 year old male with a past medical history as listed below, known to Dr. Havery Moros, who presents to clinic today for his history of adenomatous polyps..      01/26/2015 colonoscopy who is severe diverticulosis with muscular hypertrophy of the sigmoid colon, along with significant looping, making for technically challenging cecal intubation, and 8-10 mm sessile polyp in the cecum, and 8-10 mm sessile polyp in the ascending colon, 4 mm sessile polyp in the ascending colon, 3 mm sessile polyp in the hepatic flexure, 4 mm sessile polyp in the transverse colon and internal hemorrhoids.  Repeat was recommended in 3 years due to pathology showing tubular adenomas.     Today, the patient presents clinic and tells me that he is doing well.  He did have some constipation for which he called our office but was told to increase his MiraLAX to 3 times a day, he did this for 2 days and returned to normal bowel habits, tells me he did have some lower abdominal discomfort at that time which is now getting better.    Denies fever, chills, weight loss, heartburn, reflux or symptoms that awaken him from sleep.  Past Medical History:  Diagnosis Date  . Allergy    seasonal  . Bladder tumor   . Cancer ([place])    bladder,, lung  . COPD (chronic obstructive pulmonary disease) ([place])   . Diverticulosis of sigmoid colon    2016  . History of kidney stones   . History of lung cancer 2008  S/P RIGHT UPPER LOPECTOMY    STAGE I  NON-SMALL CELL LUNG CANCER--  ONCOLOGIST- DR MOHAMED-   NO RECURRENCE  . Hyperlipidemia   . Hypertension   . Impaired hearing bilateral aids  . Irregular heart rate    per pt heart will skip a beat   . Unspecified vitamin D deficiency     Past Surgical History:  Procedure Laterality Date  . COLONOSCOPY  05-09-2004   HPP and tics [place]   . EXTRACORPOREAL SHOCK WAVE LITHOTRIPSY Left 04/18/2018   Procedure:  EXTRACORPOREAL SHOCK WAVE LITHOTRIPSY (ESWL);  Surgeon: Festus Aloe, MD;  Location: [facility];  Service: Urology;  Laterality: Left;  . FIBEROPTIC BRONCHOSCOPY  10-28-2008  . LITHOTRIPSY  2012  . LUNG REMOVAL, PARTIAL  2008  . PROSTATE SURGERY     prostatectomy  . RIGHT UPPER LUNG LOBECTOMY  09-21-2006  DR BURNEY   NON-SMALL CELL CANCER/ SEVERE COPD  . TRANSURETHRAL RESECTION OF BLADDER TUMOR  12/25/2011   Procedure: TRANSURETHRAL RESECTION OF BLADDER TUMOR (TURBT);  Surgeon: Franchot Gallo, MD;  Location: [facility];  Service: Urology;  Laterality: N/A;  . TRANSURETHRAL RESECTION OF PROSTATE  12/25/2011   Procedure: TRANSURETHRAL RESECTION OF THE PROSTATE WITH GYRUS INSTRUMENTS;  Surgeon: Franchot Gallo, MD;  Location: [facility];  Service: Urology;  Laterality: N/A;  2 HRS     Current Outpatient Medications  Medication Sig Dispense Refill  . albuterol (PROVENTIL HFA;VENTOLIN HFA) 108 (90 Base) MCG/ACT inhaler Inhale 1-2 puffs into the lungs every 6 (six) hours as needed for wheezing or shortness of breath. 18 g 2  . aspirin 81 MG tablet Take 81 mg by mouth daily.    . cetirizine (ZYRTEC) 10 MG tablet Take 1 tablet (10 mg total) by mouth daily. One tab daily for allergies 30 tablet 3  . Cholecalciferol (VITAMIN D) 2000 UNITS CAPS Take 8,000 Units  by mouth daily.     Marland Kitchen glucose blood (FREESTYLE TEST STRIPS) test strip Check blood sugar 1 time daily-DX-R73.03. 100 each 1  . glucose monitoring kit (FREESTYLE) monitoring kit Check blood sugar 1 time daily-DX-R73.03. 1 each 0  . Lancets (FREESTYLE) lancets Check blood sugar 1 time daily-DX-R73.03. 100 each 1  . Multiple Vitamin (MULTIVITAMIN WITH MINERALS) TABS Take 1 tablet by mouth every evening.     . Omega-3 Fatty Acids (FISH OIL) 1000 MG CAPS Take 1 capsule by mouth every evening.     . rosuvastatin (CRESTOR) 10 MG tablet Take 10 mg by mouth daily.    . vitamin C (ASCORBIC ACID) 500 MG tablet Take  500 mg by mouth daily.     No current facility-administered medications for this visit.     Allergies as of 11/14/2018 - Review Complete 11/14/2018  Allergen Reaction Noted  . Nitrofuran derivatives Shortness Of Breath 10/23/2011    Family History  Problem Relation Age of Onset  . Cancer Mother        Non-Hodgkin's Lymphoma  . Diabetes Mother   . Hypertension Mother   . Colon polyps Mother   . Cancer Father        Colon cancer w/ metastasis to lung & bone  . Colon cancer Father   . Cancer Brother 51       Lung cancer  . Diabetes Maternal Grandmother   . Heart failure Maternal Grandmother   . Cancer Brother 65       Non-small cell Lung cancer  . Pneumonia Maternal Grandfather   . Esophageal cancer Neg Hx   . Rectal cancer Neg Hx   . Stomach cancer Neg Hx     Social History   Socioeconomic History  . Marital status: Single    Spouse name: Not on file  . Number of children: 0  . Years of education: Not on file  . Highest education level: Not on file  Occupational History  . Occupation: retired  Scientific laboratory technician  . Financial resource strain: Not on file  . Food insecurity    Worry: Not on file    Inability: Not on file  . Transportation needs    Medical: Not on file    Non-medical: Not on file  Tobacco Use  . Smoking status: Former Smoker    Packs/day: 1.00    Years: 35.00    Pack years: 35.00    Types: Cigarettes    Quit date: 02/07/2004    Years since quitting: 14.7  . Smokeless tobacco: Never Used  Substance and Sexual Activity  . Alcohol use: Yes    Alcohol/week: 0.0 standard drinks    Comment: socially  . Drug use: No  . Sexual activity: Not on file  Lifestyle  . Physical activity    Days per week: Not on file    Minutes per session: Not on file  . Stress: Not on file  Relationships  . Social Herbalist on phone: Not on file    Gets together: Not on file    Attends religious service: Not on file    Active member of club or organization:  Not on file    Attends meetings of clubs or organizations: Not on file    Relationship status: Not on file  . Intimate partner violence    Fear of current or ex partner: Not on file    Emotionally abused: Not on file    Physically abused: Not on  file    Forced sexual activity: Not on file  Other Topics Concern  . Not on file  Social History Narrative  . Not on file    Review of Systems:    Constitutional: No weight loss, fever or chills Skin: No rash  Cardiovascular: No chest pain  Respiratory: No SOB  Gastrointestinal: See HPI and otherwise negative Genitourinary: No dysuria  Neurological: No headache, dizziness or syncope Musculoskeletal: No new muscle or joint pain Hematologic: No bleeding  Psychiatric: No history of depression or anxiety   Physical Exam:  Vital signs: BP (!) 148/80   Pulse 72   Temp 97.8 F (36.6 C)   Ht 5' 3.5" (1.613 m)   Wt 163 lb (73.9 kg)   BMI 28.42 kg/m   Constitutional:   Pleasant Elderly Caucasian male appears to be in NAD, Well developed, Well nourished, alert and cooperative Head:  Normocephalic and atraumatic. Eyes:   PEERL, EOMI. No icterus. Conjunctiva pink. Ears:  Normal auditory acuity. Neck:  Supple Throat: Oral cavity and pharynx without inflammation, swelling or lesion.  Respiratory: Respirations even and unlabored. Lungs clear to auscultation bilaterally.   No wheezes, crackles, or rhonchi.  Cardiovascular: Normal S1, S2. No MRG. Regular rate and rhythm. No peripheral edema, cyanosis or pallor.  Gastrointestinal:  Soft, nondistended, nontender. No rebound or guarding. Normal bowel sounds. No appreciable masses or hepatomegaly. Rectal:  Not performed.  Msk:  Symmetrical without gross deformities. Without edema, no deformity or joint abnormality.  Neurologic:  Alert and  oriented x4;  grossly normal neurologically.  Skin:   Dry and intact without significant lesions or rashes. Psychiatric: Demonstrates good judgement and reason  without abnormal affect or behaviors.  MOST RECENT LABS AND IMAGING: CBC    Component Value Date/Time   WBC 8.3 09/25/2018 0931   RBC 4.48 09/25/2018 0931   HGB 14.3 09/25/2018 0931   HGB 13.9 10/22/2015 0900   HCT 42.2 09/25/2018 0931   HCT 41.6 10/22/2015 0900   PLT 210 09/25/2018 0931   PLT 163 10/22/2015 0900   MCV 94.2 09/25/2018 0931   MCV 93.1 10/22/2015 0900   MCH 31.9 09/25/2018 0931   MCHC 33.9 09/25/2018 0931   RDW 12.6 09/25/2018 0931   RDW 13.3 10/22/2015 0900   LYMPHSABS 3,835 09/25/2018 0931   LYMPHSABS 2.6 10/22/2015 0900   MONOABS 0.6 03/18/2018 1319   MONOABS 0.4 10/22/2015 0900   EOSABS 141 09/25/2018 0931   EOSABS 0.1 10/22/2015 0900   BASOSABS 58 09/25/2018 0931   BASOSABS 0.1 10/22/2015 0900    CMP     Component Value Date/Time   NA 141 09/25/2018 0931   NA 143 10/22/2015 0900   K 4.3 09/25/2018 0931   K 4.4 10/22/2015 0900   CL 105 09/25/2018 0931   CL 108 (H) 10/16/2011 0828   CO2 27 09/25/2018 0931   CO2 25 10/22/2015 0900   GLUCOSE 93 09/25/2018 0931   GLUCOSE 96 10/22/2015 0900   GLUCOSE 100 (H) 10/16/2011 0828   BUN 14 09/25/2018 0931   BUN 11.6 10/22/2015 0900   CREATININE 0.89 09/25/2018 0931   CREATININE 0.9 10/22/2015 0900   CALCIUM 9.8 09/25/2018 0931   CALCIUM 9.2 10/22/2015 0900   PROT 6.6 09/25/2018 0931   PROT 6.6 10/22/2015 0900   ALBUMIN 4.2 07/12/2016 1008   ALBUMIN 3.7 10/22/2015 0900   AST 24 09/25/2018 0931   AST 21 10/22/2015 0900   ALT 21 09/25/2018 0931   ALT 18 10/22/2015 0900  ALKPHOS 73 07/12/2016 1008   ALKPHOS 90 10/22/2015 0900   BILITOT 0.6 09/25/2018 0931   BILITOT 0.57 10/22/2015 0900   GFRNONAA 81 09/25/2018 0931   GFRAA 94 09/25/2018 0931    Assessment: 1.  History of adenomatous polyps: Last colonoscopy in 2016 with multiple adenomatous polyps, repeat recommended in 3 years  Plan: 1.  Discussed colonoscopy with the patient.  He wishes to proceed.  Discussed risk of benefits, limitations  and alternatives and patient agrees to have procedure.  This was scheduled with Dr. Havery Moros in the [facility]. 2.  Patient to follow in clinic per recommendations from Dr. Havery Moros after time of procedure.  Ellouise Newer, PA-C [facility] 11/14/2018, 9:15 AM  Cc: [name], MD

## 2018-11-14 NOTE — Progress Notes (Signed)
Agree with assessment with the following thoughts. If life expectancy is likely another 10 years then can proceed with colonoscopy if patient strongly wishes to do so, as he had a few 36mm polyps on last exam. If he has reservations about this or other active significant medical problems then would hold off given his age.

## 2018-11-27 ENCOUNTER — Encounter: Payer: Self-pay | Admitting: Gastroenterology

## 2018-12-11 ENCOUNTER — Ambulatory Visit (AMBULATORY_SURGERY_CENTER): Payer: PPO | Admitting: Gastroenterology

## 2018-12-11 ENCOUNTER — Other Ambulatory Visit: Payer: Self-pay

## 2018-12-11 ENCOUNTER — Encounter: Payer: Self-pay | Admitting: Gastroenterology

## 2018-12-11 VITALS — BP 126/77 | HR 73 | Temp 98.3°F | Resp 14 | Ht 63.0 in | Wt 163.0 lb

## 2018-12-11 DIAGNOSIS — D123 Benign neoplasm of transverse colon: Secondary | ICD-10-CM | POA: Diagnosis not present

## 2018-12-11 DIAGNOSIS — Z86010 Personal history of colonic polyps: Secondary | ICD-10-CM

## 2018-12-11 DIAGNOSIS — D122 Benign neoplasm of ascending colon: Secondary | ICD-10-CM

## 2018-12-11 DIAGNOSIS — I10 Essential (primary) hypertension: Secondary | ICD-10-CM | POA: Diagnosis not present

## 2018-12-11 MED ORDER — SODIUM CHLORIDE 0.9 % IV SOLN: 500.0000 mL | INTRAVENOUS | Status: DC

## 2018-12-11 NOTE — Progress Notes (Signed)
Report given to PACU, vss 

## 2018-12-11 NOTE — Op Note (Signed)
[facility] Patient Name: Raymond Patrick Procedure Date: 12/11/2018 10:29 AM MRN: [id number] Endoscopist: Remo Lipps P. Havery Moros , MD Age: 83 Referring MD:  Date of Birth: 09/28/1935 Gender: Male Account #: [id number] Procedure:                Colonoscopy Indications:              Surveillance: Personal history of adenomatous                            polyps (largest 44mm in size) on last colonoscopy                            2016 Medicines:                Monitored Anesthesia Care Procedure:                Pre-Anesthesia Assessment:                           - Prior to the procedure, a History and Physical                            was performed, and patient medications and                            allergies were reviewed. The patient's tolerance of                            previous anesthesia was also reviewed. The risks                            and benefits of the procedure and the sedation                            options and risks were discussed with the patient.                            All questions were answered, and informed consent                            was obtained. Prior Anticoagulants: The patient has                            taken no previous anticoagulant or antiplatelet                            agents. ASA Grade Assessment: II - A patient with                            mild systemic disease. After reviewing the risks                            and benefits, the patient was deemed in  satisfactory condition to undergo the procedure.                           After obtaining informed consent, the colonoscope                            was passed under direct vision. Throughout the                            procedure, the patient's blood pressure, pulse, and                            oxygen saturations were monitored continuously. The                            Colonoscope was introduced through the anus and                          advanced to the the cecum, identified by                            appendiceal orifice and ileocecal valve. The                            colonoscopy was performed without difficulty. The                            patient tolerated the procedure well. The quality                            of the bowel preparation was adequate. The                            ileocecal valve, appendiceal orifice, and rectum                            were photographed. Scope In: 10:39:00 AM Scope Out: 11:06:53 AM Scope Withdrawal Time: 0 hours 21 minutes 43 seconds  Total Procedure Duration: 0 hours 27 minutes 53 seconds  Findings:                 The perianal and digital rectal examinations were                            normal.                           Multiple small-mouthed diverticula were found in                            the sigmoid colon with hypertrophied folds and                            angulated turns. Cecal intubation was performed  entirely with water immersion technique and no air.                           A 5 mm polyp was found in the ascending colon. The                            polyp was flat. The polyp was removed with a cold                            snare. Resection and retrieval were complete.                           A 5 mm polyp was found in the transverse colon. The                            polyp was sessile. The polyp was removed with a                            cold snare. Resection and retrieval were complete.                           Internal hemorrhoids were found during                            retroflexion, with some prominent vascular markings                            in the distal rectum.                           The exam was otherwise without abnormality. Complications:            No immediate complications. Estimated blood loss:                            Minimal. Estimated Blood Loss:     Estimated  blood loss was minimal. Impression:               - Severe diverticulosis in the sigmoid colon.                           - One 5 mm polyp in the ascending colon, removed                            with a cold snare. Resected and retrieved.                           - One 5 mm polyp in the transverse colon, removed                            with a cold snare. Resected and retrieved.                           - Internal hemorrhoids.                           -  The examination was otherwise normal. Recommendation:           - Patient has a contact number available for                            emergencies. The signs and symptoms of potential                            delayed complications were discussed with the                            patient. Return to normal activities tomorrow.                            Written discharge instructions were provided to the                            patient.                           - Resume previous diet.                           - Continue present medications.                           - Await pathology results.                           - No further surveillance colonoscopy exams are                            recommended due to age Carlota Raspberry. Tsuruko Murtha, MD 12/11/2018 11:15:17 AM This report has been signed electronically.

## 2018-12-11 NOTE — Progress Notes (Signed)
Called to room to assist during endoscopic procedure.  Patient ID and intended procedure confirmed with present staff. Received instructions for my participation in the procedure from the performing physician.  

## 2018-12-11 NOTE — Patient Instructions (Signed)
Handout on polyps, diverticulosis and hemorrhoids given  YOU HAD AN ENDOSCOPIC PROCEDURE TODAY AT [facility]:   Refer to the procedure report that was given to you for any specific questions about what was found during the examination.  If the procedure report does not answer your questions, please call your gastroenterologist to clarify.  If you requested that your care partner not be given the details of your procedure findings, then the procedure report has been included in a sealed envelope for you to review at your convenience later.  YOU SHOULD EXPECT: Some feelings of bloating in the abdomen. Passage of more gas than usual.  Walking can help get rid of the air that was put into your GI tract during the procedure and reduce the bloating. If you had a lower endoscopy (such as a colonoscopy or flexible sigmoidoscopy) you may notice spotting of blood in your stool or on the toilet paper. If you underwent a bowel prep for your procedure, you may not have a normal bowel movement for a few days.  Please Note:  You might notice some irritation and congestion in your nose or some drainage.  This is from the oxygen used during your procedure.  There is no need for concern and it should clear up in a day or so.  SYMPTOMS TO REPORT IMMEDIATELY:   Following lower endoscopy (colonoscopy or flexible sigmoidoscopy):  Excessive amounts of blood in the stool  Significant tenderness or worsening of abdominal pains  Swelling of the abdomen that is new, acute  Fever of 100F or higher   For urgent or emergent issues, a gastroenterologist can be reached at any hour by calling [phone].   DIET:  We do recommend a small meal at first, but then you may proceed to your regular diet.  Drink plenty of fluids but you should avoid alcoholic beverages for 24 hours.  ACTIVITY:  You should plan to take it easy for the rest of today and you should NOT DRIVE or use heavy machinery until tomorrow  (because of the sedation medicines used during the test).    FOLLOW UP: Our staff will call the number listed on your records 48-72 hours following your procedure to check on you and address any questions or concerns that you may have regarding the information given to you following your procedure. If we do not reach you, we will leave a message.  We will attempt to reach you two times.  During this call, we will ask if you have developed any symptoms of COVID 19. If you develop any symptoms (ie: fever, flu-like symptoms, shortness of breath, cough etc.) before then, please call [phone].  If you test positive for Covid 19 in the 2 weeks post procedure, please call and report this information to [place].    If any biopsies were taken you will be contacted by phone or by letter within the next 1-3 weeks.  Please call us at [phone] if you have not heard about the biopsies in 3 weeks.    SIGNATURES/CONFIDENTIALITY: You and/or your care partner have signed paperwork which will be entered into your electronic medical record.  These signatures attest to the fact that that the information above on your After Visit Summary has been reviewed and is understood.  Full responsibility of the confidentiality of this discharge information lies with you and/or your care-partner.

## 2018-12-13 ENCOUNTER — Telehealth: Payer: Self-pay

## 2018-12-13 NOTE — Telephone Encounter (Signed)
  Follow up Call-  Call back number 12/11/2018  Post procedure Call Back phone  # [phone].  Permission to leave phone message Yes  Some recent data might be hidden     Patient questions:  Do you have a fever, pain , or abdominal swelling? No. Pain Score  0 *  Have you tolerated food without any problems? Yes.    Have you been able to return to your normal activities? Yes.    Do you have any questions about your discharge instructions: Diet   No. Medications  No. Follow up visit  No.  Do you have questions or concerns about your Care? No.  Actions: * If pain score is 4 or above: No action needed, pain <4.  1. Have you developed a fever since your procedure?no  2.   Have you had an respiratory symptoms (SOB or cough) since your procedure? no 3.   Have you tested positive for COVID 19 since your procedure no  4.   Have you had any family members/close contacts diagnosed with the COVID 19 since your procedure? no   If yes to any of these questions please route to Joylene John, RN and Alphonsa Gin, Therapist, sports.

## 2018-12-25 NOTE — Progress Notes (Signed)
3 MONTH FOLLOW UP  Assessment:   Raymond was seen today for follow-up.  Diagnoses and all orders for this visit:  Essential hypertension Hypertension Controlled by diet and exercise Monitor blood pressure at home; call if consistently over 130/80 Continue DASH diet.   Reminder to go to the ER if any CP, SOB, nausea, dizziness, severe HA, changes vision/speech, left arm numbness and tingling and jaw pain. -     CBC with Diff -     COMPLETE METABOLIC PANEL WITH GFR -     TSH  Hyperlipidemia Cholesterol Continue medications: Crestor 37m and Fish Oil 1,0060mdaily Continue low cholesterol diet and exercise.  -     Lipid Profile  Vitamin D deficiency Continue supplementation Taking Vitamin D 8,000 IU daily -     Vitamin D (25 hydroxy)  Other abnormal glucose Discussed dietary and exercise modifications -     Hemoglobin A1c ([facility])  Benign prostatic hyperplasia without lower urinary tract symptoms Doing well at this time No medications Will continue to monitor Defer PSA this check  Chronic rhinitis Discussed antihistamines provided printed education of choices and options or changing if needed  Conductive hearing loss, bilateral Continue to monitor  Aortic atherosclerosis (HCC) Control blood pressure, lipids and glucose Disscused lifestyle modifications, diet & exercise Continue to monitor  Overweight (BMI 25.0-29.9) Discussed dietary and exercise modifications  Medication management Continued   Over 30 minutes of interview exam, counseling, chart review and critical decision making was performed Future Appointments  Date Time Provider [place]12/09/2018  7:30 AM MaHayden PedroMD [facility] None  04/07/2019 11:00 AM McUnk PintoMD [facility] None  07/09/2019 10:00 AM CoLiane ComberNP [facility] None    Subjective:  Raymond Patrick a [age].o. male who presents for  3 month follow up.   His blood pressure has been controlled at  home, today their BP is BP: 130/78 He does workout. He denies chest pain, shortness of breath, dizziness.  He is on cholesterol medication and denies myalgias. His cholesterol is at goal. The cholesterol last visit was:   Lab Results  Component Value Date   CHOL 166 09/25/2018   HDL 49 09/25/2018   LDLCALC 96 09/25/2018   TRIG 111 09/25/2018   CHOLHDL 3.4 09/25/2018    He has been working on diet and exercise for prediabetes, and denies paresthesia of the feet, polydipsia, polyuria, visual disturbances, vomiting and weight loss. Last A1C in the office was:  Lab Results  Component Value Date   HGBA1C 5.5 09/25/2018   Last GFR:   Lab Results  Component Value Date   GFRNONAA 81 09/25/2018   Lab Results  Component Value Date   GFRAA 94 09/25/2018   Patient is on Vitamin D supplement.   Lab Results  Component Value Date   VD25OH 69 09/25/2018      Medication Review: Current Outpatient Medications on File Prior to Visit  Medication Sig Dispense Refill  . aspirin 81 MG tablet Take 81 mg by mouth daily.    . cetirizine (ZYRTEC) 10 MG tablet Take 1 tablet (10 mg total) by mouth daily. One tab daily for allergies 30 tablet 3  . Cholecalciferol (VITAMIN D) 2000 UNITS CAPS Take 8,000 Units by mouth daily.     . Marland Kitchenlucose monitoring kit (FREESTYLE) monitoring kit Check blood sugar 1 time daily-DX-R73.03. 1 each 0  . Lancets (FREESTYLE) lancets Check blood sugar 1 time daily-DX-R73.03. 100 each 1  . Multiple Vitamin (MULTIVITAMIN  WITH MINERALS) TABS Take 1 tablet by mouth every evening.     . Omega-3 Fatty Acids (FISH OIL) 1000 MG CAPS Take 1 capsule by mouth every evening.     . rosuvastatin (CRESTOR) 10 MG tablet Take 10 mg by mouth daily.    . vitamin C (ASCORBIC ACID) 500 MG tablet Take 500 mg by mouth daily.    Marland Kitchen albuterol (PROVENTIL HFA;VENTOLIN HFA) 108 (90 Base) MCG/ACT inhaler Inhale 1-2 puffs into the lungs every 6 (six) hours as needed for wheezing or shortness of breath.  (Patient not taking: Reported on 12/11/2018) 18 g 2  . glucose blood (FREESTYLE TEST STRIPS) test strip Check blood sugar 1 time daily-DX-R73.03. 100 each 1   No current facility-administered medications on file prior to visit.     Allergies  Allergen Reactions  . Nitrofuran Derivatives Shortness Of Breath    macrobid    Current Problems (verified) Patient Active Problem List   Diagnosis Date Noted  . Bilateral hearing loss 08/22/2017  . Aortic atherosclerosis ([place]) 02/06/2017  . BPH (benign prostatic hyperplasia) 11/12/2015  . Overweight (BMI 25.0-29.9) 11/03/2014  . Encounter for Medicare annual wellness exam 11/03/2014  . History of lung cancer 01/20/2014  . Other abnormal glucose 03/25/2013  . Hyperlipidemia   . History of smoking   . Hypertension   . Vitamin D deficiency     Screening Tests Immunization History  Administered Date(s) Administered  . Influenza, High Dose Seasonal PF 11/03/2014, 11/02/2016, 11/28/2017, 11/30/2018  . Influenza,inj,Quad PF,6+ Mos 11/09/2015  . Influenza-Unspecified 11/06/2012  . Pneumococcal Conjugate-13 01/20/2014  . Pneumococcal-Unspecified 02/06/2002, 09/23/2012  . Tdap 03/09/2010  . Zoster 08/03/2014   Patient Care Team: [name], MD as PCP - General (Internal Medicine) Franchot Gallo, MD as Consulting Physician (Urology) Curt Bears, MD as Consulting Physician (Oncology) Lorretta Harp, MD as Consulting Physician (Cardiology) Inda Castle, MD (Inactive) as Consulting Physician (Gastroenterology) Melissa Noon, [place] as Referring Physician (Optometry)  SURGICAL HISTORY He  has a past surgical history that includes Lung removal, partial (2008); Lithotripsy (2012); Fiberoptic bronchoscopy (10-28-2008); RIGHT UPPER LUNG LOBECTOMY (09-21-2006  DR Arlyce Dice); Transurethral resection of prostate (12/25/2011); Transurethral resection of bladder tumor (12/25/2011); Prostate surgery; Colonoscopy (05-09-2004); and  Extracorporeal shock wave lithotripsy (Left, 04/18/2018). FAMILY HISTORY His family history includes Cancer in his father and mother; Cancer (age of onset: 32) in his brother; Cancer (age of onset: 63) in his brother; Colon cancer in his father; Colon polyps in his mother; Diabetes in his maternal grandmother and mother; Heart failure in his maternal grandmother; Hypertension in his mother; Pneumonia in his maternal grandfather. SOCIAL HISTORY He  reports that he quit smoking about 14 years ago. His smoking use included cigarettes. He has a 35.00 pack-year smoking history. He has never used smokeless tobacco. He reports current alcohol use. He reports that he does not use drugs.     Review of Systems  Constitutional: Negative for chills, diaphoresis, fever, malaise/fatigue and weight loss.  HENT: Positive for hearing loss. Negative for congestion, ear discharge, ear pain, nosebleeds, sinus pain, sore throat and tinnitus.        Increase in post nasal drip  Eyes: Negative for blurred vision, double vision, photophobia, pain, discharge and redness.  Respiratory: Negative for cough, hemoptysis, sputum production, shortness of breath, wheezing and stridor.   Cardiovascular: Negative for chest pain, palpitations, orthopnea, claudication, leg swelling and PND.  Gastrointestinal: Negative for abdominal pain, blood in stool, constipation, diarrhea, heartburn, melena, nausea and vomiting.  Genitourinary: Negative for dysuria, flank pain, frequency, hematuria and urgency.  Musculoskeletal: Negative for back pain, falls, joint pain, myalgias and neck pain.  Skin: Negative for itching and rash.  Neurological: Negative for dizziness, tingling, tremors, sensory change, speech change, focal weakness, seizures, loss of consciousness, weakness and headaches.  Endo/Heme/Allergies: Negative for environmental allergies and polydipsia. Does not bruise/bleed easily.  Psychiatric/Behavioral: Negative for depression,  hallucinations, memory loss, substance abuse and suicidal ideas. The patient is not nervous/anxious and does not have insomnia.      Objective:     Today's Vitals   12/26/18 1041  BP: 130/78  Pulse: 64  Temp: (!) 96.8 F (36 C)  SpO2: 98%  Weight: 165 lb (74.8 kg)   Body mass index is 29.23 kg/m.  General appearance: alert, no distress, WD/WN, male HEENT: normocephalic, sclerae anicteric, TMs pearly, nares patent, no discharge or erythema, pharynx normal Oral cavity: MMM, no lesions Neck: supple, no lymphadenopathy, no thyromegaly, no masses Heart: RRR, normal S1, S2, no murmurs Lungs: CTA bilaterally, no wheezes, rhonchi, or rales Abdomen: +bs, soft, non tender, non distended, no masses, no hepatomegaly, no splenomegaly Musculoskeletal: nontender, no swelling, no obvious deformity Extremities: no edema, no cyanosis, no clubbing Pulses: 2+ symmetric, upper and lower extremities, normal cap refill Neurological: alert, oriented x 3, CN2-12 intact, strength normal upper extremities and lower extremities, sensation normal throughout, DTRs 2+ throughout, no cerebellar signs, gait normal Psychiatric: normal affect, behavior normal, pleasant   Garnet Sierras, NP [facility] 12/26/2018  10:53 AM

## 2018-12-26 ENCOUNTER — Ambulatory Visit (INDEPENDENT_AMBULATORY_CARE_PROVIDER_SITE_OTHER): Payer: PPO | Admitting: Adult Health Nurse Practitioner

## 2018-12-26 ENCOUNTER — Encounter: Payer: Self-pay | Admitting: Adult Health Nurse Practitioner

## 2018-12-26 ENCOUNTER — Other Ambulatory Visit: Payer: Self-pay

## 2018-12-26 VITALS — BP 130/78 | HR 64 | Temp 96.8°F | Wt 165.0 lb

## 2018-12-26 DIAGNOSIS — H900 Conductive hearing loss, bilateral: Secondary | ICD-10-CM | POA: Diagnosis not present

## 2018-12-26 DIAGNOSIS — R7309 Other abnormal glucose: Secondary | ICD-10-CM | POA: Diagnosis not present

## 2018-12-26 DIAGNOSIS — I700 Atherosclerosis of aorta: Secondary | ICD-10-CM

## 2018-12-26 DIAGNOSIS — E663 Overweight: Secondary | ICD-10-CM | POA: Diagnosis not present

## 2018-12-26 DIAGNOSIS — I10 Essential (primary) hypertension: Secondary | ICD-10-CM

## 2018-12-26 DIAGNOSIS — N400 Enlarged prostate without lower urinary tract symptoms: Secondary | ICD-10-CM | POA: Diagnosis not present

## 2018-12-26 DIAGNOSIS — J310 Chronic rhinitis: Secondary | ICD-10-CM | POA: Diagnosis not present

## 2018-12-26 DIAGNOSIS — E782 Mixed hyperlipidemia: Secondary | ICD-10-CM

## 2018-12-26 DIAGNOSIS — Z79899 Other long term (current) drug therapy: Secondary | ICD-10-CM | POA: Diagnosis not present

## 2018-12-26 DIAGNOSIS — E559 Vitamin D deficiency, unspecified: Secondary | ICD-10-CM | POA: Diagnosis not present

## 2018-12-26 NOTE — Patient Instructions (Addendum)
You can try an antihistamine.  This will help to dry up the drainage.  Allergy Symptoms / Runny Nose: Chose one  Claritin Take 10mg  once a day or the 12hour (5mg  twice a day)   Xyzal / Levocetirazine   This is similar to Zyrtec Take 5mg  by mouth May cause drowsiness, take nightly Be sure to drink plenty of water If this is not effective try Allegra or Zyrtec  OR  Allegra / fexofenadine Take 180mg  by mouth daily If this is not effective try Zyrtec or Xyzal   OR  Zyrtec / Cetirizine Take 10mg  by mouth May cause drowsiness, take nightly Be sure to drink plenty of water  Singular is another antihistamine you can try.  You can take this WITH the antihistamines above if needed.    *If you battle with chronic allergies you may need to change the antihistamine you currently use to find most effective.   You may continue taking Singular with one of the above antihistamines.  Try Mucinex (Regular-Blue) 600mg  every 12 hours.  This will help to thin the mucus and make it easier to get up.  Use this if you are having trouble clearing this from your chest.    Can try melatonin 5mg -15 mg at night for sleep I Also here is some information about good sleep hygiene.   Insomnia Insomnia is frequent trouble falling and/or staying asleep. Insomnia can be a long term problem or a short term problem. Both are common. Insomnia can be a short term problem when the wakefulness is related to a certain stress or worry. Long term insomnia is often related to ongoing stress during waking hours and/or poor sleeping habits. Overtime, sleep deprivation itself can make the problem worse. Every little thing feels more severe because you are overtired and your ability to cope is decreased. CAUSES   Stress, anxiety, and depression.  Poor sleeping habits.  Distractions such as TV in the bedroom.  Naps close to bedtime.  Engaging in emotionally charged conversations before bed.  Technical  reading before sleep.  Alcohol and other sedatives. They may make the problem worse. They can hurt normal sleep patterns and normal dream activity.  Stimulants such as caffeine for several hours prior to bedtime.  Pain syndromes and shortness of breath can cause insomnia.  Exercise late at night.  Changing time zones may cause sleeping problems (jet lag). It is sometimes helpful to have someone observe your sleeping patterns. They should look for periods of not breathing during the night (sleep apnea). They should also look to see how long those periods last. If you live alone or observers are uncertain, you can also be observed at a sleep clinic where your sleep patterns will be professionally monitored. Sleep apnea requires a checkup and treatment. Give your caregivers your medical history. Give your caregivers observations your family has made about your sleep.  SYMPTOMS   Not feeling rested in the morning.  Anxiety and restlessness at bedtime.  Difficulty falling and staying asleep. TREATMENT   Your caregiver may prescribe treatment for an underlying medical disorders. Your caregiver can give advice or help if you are using alcohol or other drugs for self-medication. Treatment of underlying problems will usually eliminate insomnia problems.  Medications can be prescribed for short time use. They are generally not recommended for lengthy use.  Over-the-counter sleep medicines are not recommended for lengthy use. They can be habit forming.  You can promote easier sleeping by making lifestyle changes such  as:  Using relaxation techniques that help with breathing and reduce muscle tension.  Exercising earlier in the day.  Changing your diet and the time of your last meal. No night time snacks.  Establish a regular time to go to bed.  Counseling can help with stressful problems and worry.  Soothing music and white noise may be helpful if there are background noises you cannot  remove.  Stop tedious detailed work at least one hour before bedtime. HOME CARE INSTRUCTIONS   Keep a diary. Inform your caregiver about your progress. This includes any medication side effects. See your caregiver regularly. Take note of:  Times when you are asleep.  Times when you are awake during the night.  The quality of your sleep.  How you feel the next day. This information will help your caregiver care for you.  Get out of bed if you are still awake after 15 minutes. Read or do some quiet activity. Keep the lights down. Wait until you feel sleepy and go back to bed.  Keep regular sleeping and waking hours. Avoid naps.  Exercise regularly.  Avoid distractions at bedtime. Distractions include watching television or engaging in any intense or detailed activity like attempting to balance the household checkbook.  Develop a bedtime ritual. Keep a familiar routine of bathing, brushing your teeth, climbing into bed at the same time each night, listening to soothing music. Routines increase the success of falling to sleep faster.  Use relaxation techniques. This can be using breathing and muscle tension release routines. It can also include visualizing peaceful scenes. You can also help control troubling or intruding thoughts by keeping your mind occupied with boring or repetitive thoughts like the old concept of counting sheep. You can make it more creative like imagining planting one beautiful flower after another in your backyard garden.  During your day, work to eliminate stress. When this is not possible use some of the previous suggestions to help reduce the anxiety that accompanies stressful situations. MAKE SURE YOU:   Understand these instructions.  Will watch your condition.  Will get help right away if you are not doing well or get worse. Document Released: 01/21/2000 Document Revised: 04/17/2011 Document Reviewed: 02/20/2007 [facility] Patient Information 2015  [place], [place]. This information is not intended to replace advice given to you by your health care provider. Make sure you discuss any questions you have with your health care provider.      Vit D  & Vit C 1,000 mg   are recommended to help protect  against the Covid-19 and other Corona viruses.    Also it's recommended  to take  Zinc 50 mg  to help  protect against the Covid-19   and best place to get  is also on [email or web address]  and don't pay more than 6-8 cents /pill !  ================================ Coronavirus (COVID-19) Are you at risk?  Are you at risk for the Coronavirus (COVID-19)?  To be considered HIGH RISK for Coronavirus (COVID-19), you have to meet the following criteria:  . Traveled to [place], [place], [place], [place] or [place]; or in the [place] to [place], [place], [place]  . or [place]; and have fever, cough, and shortness of breath within the last 2 weeks of travel OR . Been in close contact with a person diagnosed with COVID-19 within the last 2 weeks and have  . fever, cough,and shortness of breath .  . IF YOU DO NOT MEET THESE CRITERIA, YOU ARE  CONSIDERED LOW RISK FOR COVID-19.  What to do if you are HIGH RISK for COVID-19?  Marland Kitchen If you are having a medical emergency, call 911. . Seek medical care right away. Before you go to a doctor's office, urgent care or emergency department, .  call ahead and tell them about your recent travel, contact with someone diagnosed with COVID-19  .  and your symptoms.  . You should receive instructions from your physician's office regarding next steps of care.  . When you arrive at healthcare provider, tell the healthcare staff immediately you have returned from  . visiting [place], [place], [place], [place] or [place]; or traveled in the [place] to [place], [place],  . [place] or [place] in the last two weeks or you have been in close contact with a person diagnosed with  . COVID-19 in the last 2  weeks.   . Tell the health care staff about your symptoms: fever, cough and shortness of breath. . After you have been seen by a medical provider, you will be either: o Tested for (COVID-19) and discharged home on quarantine except to seek medical care if  o symptoms worsen, and asked to  - Stay home and avoid contact with others until you get your results (4-5 days)  - Avoid travel on public transportation if possible (such as bus, train, or airplane) or o Sent to the Emergency Department by EMS for evaluation, COVID-19 testing  and  o possible admission depending on your condition and test results.  What to do if you are LOW RISK for COVID-19?  Reduce your risk of any infection by using the same precautions used for avoiding the common cold or flu:  Marland Kitchen Wash your hands often with soap and warm water for at least 20 seconds.  If soap and water are not readily available,  . use an alcohol-based hand sanitizer with at least 60% alcohol.  . If coughing or sneezing, cover your mouth and nose by coughing or sneezing into the elbow areas of your shirt or coat, .  into a tissue or into your sleeve (not your hands). . Avoid shaking hands with others and consider head nods or verbal greetings only. . Avoid touching your eyes, nose, or mouth with unwashed hands.  . Avoid close contact with people who are sick. . Avoid places or events with large numbers of people in one location, like concerts or sporting events. . Carefully consider travel plans you have or are making. . If you are planning any travel outside or inside the [place], visit the CDC's Travelers' Health webpage for the latest health notices. . If you have some symptoms but not all symptoms, continue to monitor at home and seek medical attention  . if your symptoms worsen. . If you are having a medical emergency, call 911. >>>>>>>>>>>>>>>>>>>>>>>>>>>>>>>>>>>>>>>>>>>>>>>>>>>>>>> We Do NOT Approve of  [facility] [place] Soliciting  Our Patients  To Do Home Visits  & We Do NOT Approve of [employer] > > > > > > > > > > > > > > > > > > > > > > > > > > > > > > > > > > >  > > > >   Preventive Care for Adults  A healthy lifestyle and preventive care can promote health and wellness. Preventive health guidelines for men include the following key practices:  A routine yearly physical is a good way to check with your health care provider  about your health and preventative screening. It is a chance to share any concerns and updates on your health and to receive a thorough exam.  Visit your dentist for a routine exam and preventative care every 6 months. Brush your teeth twice a day and floss once a day. Good oral hygiene prevents tooth decay and gum disease.  The frequency of eye exams is based on your age, health, family medical history, use of contact lenses, and other factors. Follow your health care provider's recommendations for frequency of eye exams.  Eat a healthy diet. Foods such as vegetables, fruits, whole grains, low-fat dairy products, and lean protein foods contain the nutrients you need without too many calories. Decrease your intake of foods high in solid fats, added sugars, and salt. Eat the right amount of calories for you. Get information about a proper diet from your health care provider, if necessary.  Regular physical exercise is one of the most important things you can do for your health. Most adults should get at least 150 minutes of moderate-intensity exercise (any activity that increases your heart rate and causes you to sweat) each week. In addition, most adults need muscle-strengthening exercises on 2 or more days a week.  Maintain a healthy weight. The body mass index (BMI) is a screening tool to identify possible weight problems. It provides an estimate of body fat based on height and weight. Your health care provider can find your BMI and can help you achieve or maintain a healthy weight. For  adults 20 years and older:  A BMI below 18.5 is considered underweight.  A BMI of 18.5 to 24.9 is normal.  A BMI of 25 to 29.9 is considered overweight.  A BMI of 30 and above is considered obese.  Maintain normal blood lipids and cholesterol levels by exercising and minimizing your intake of saturated fat. Eat a balanced diet with plenty of fruit and vegetables. Blood tests for lipids and cholesterol should begin at age 17 and be repeated every 5 years. If your lipid or cholesterol levels are high, you are over 50, or you are at high risk for heart disease, you may need your cholesterol levels checked more frequently. Ongoing high lipid and cholesterol levels should be treated with medicines if diet and exercise are not working.  If you smoke, find out from your health care provider how to quit. If you do not use tobacco, do not start.  Lung cancer screening is recommended for adults aged 36-80 years who are at high risk for developing lung cancer because of a history of smoking. A yearly low-dose CT scan of the lungs is recommended for people who have at least a 30-pack-year history of smoking and are a current smoker or have quit within the past 15 years. A pack year of smoking is smoking an average of 1 pack of cigarettes a day for 1 year (for example: 1 pack a day for 30 years or 2 packs a day for 15 years). Yearly screening should continue until the smoker has stopped smoking for at least 15 years. Yearly screening should be stopped for people who develop a health problem that would prevent them from having lung cancer treatment.  If you choose to drink alcohol, do not have more than 2 drinks per day. One drink is considered to be 12 ounces (355 mL) of beer, 5 ounces (148 mL) of wine, or 1.5 ounces (44 mL) of liquor.  Avoid use of street drugs. Do not share  needles with anyone. Ask for help if you need support or instructions about stopping the use of drugs.  High blood pressure causes  heart disease and increases the risk of stroke. Your blood pressure should be checked at least every 1-2 years. Ongoing high blood pressure should be treated with medicines, if weight loss and exercise are not effective.  If you are 54-53 years old, ask your health care provider if you should take aspirin to prevent heart disease.  Diabetes screening involves taking a blood sample to check your fasting blood sugar level. Testing should be considered at a younger age or be carried out more frequently if you are overweight and have at least 1 risk factor for diabetes.  Colorectal cancer can be detected and often prevented. Most routine colorectal cancer screening begins at the age of 79 and continues through age 18. However, your health care provider may recommend screening at an earlier age if you have risk factors for colon cancer. On a yearly basis, your health care provider may provide home test kits to check for hidden blood in the stool. Use of a small camera at the end of a tube to directly examine the colon (sigmoidoscopy or colonoscopy) can detect the earliest forms of colorectal cancer. Talk to your health care provider about this at age 43, when routine screening begins. Direct exam of the colon should be repeated every 5-10 years through age 52, unless early forms of precancerous polyps or small growths are found.  Hepatitis C blood testing is recommended for all people born from [birth year] through 1963 and any individual with known risks for hepatitis C.  Screening for abdominal aortic aneurysm (AAA)  by ultrasound is recommended for people who have history of high blood pressure or who are current or former smokers.  Healthy men should  receive prostate-specific antigen (PSA) blood tests as part of routine cancer screening. Talk with your health care provider about prostate cancer screening.  Testicular cancer screening is  recommended for adult males. Screening includes self-exam, a health care  provider exam, and other screening tests. Consult with your health care provider about any symptoms you have or any concerns you have about testicular cancer.  Use sunscreen. Apply sunscreen liberally and repeatedly throughout the day. You should seek shade when your shadow is shorter than you. Protect yourself by wearing long sleeves, pants, a wide-brimmed hat, and sunglasses year round, whenever you are outdoors.  Once a month, do a whole-body skin exam, using a mirror to look at the skin on your back. Tell your health care provider about new moles, moles that have irregular borders, moles that are larger than a pencil eraser, or moles that have changed in shape or color.  Stay current with required vaccines (immunizations).  Influenza vaccine. All adults should be immunized every year.  Tetanus, diphtheria, and acellular pertussis (Td, Tdap) vaccine. An adult who has not previously received Tdap or who does not know his vaccine status should receive 1 dose of Tdap. This initial dose should be followed by tetanus and diphtheria toxoids (Td) booster doses every 10 years. Adults with an unknown or incomplete history of completing a 3-dose immunization series with Td-containing vaccines should begin or complete a primary immunization series including a Tdap dose. Adults should receive a Td booster every 10 years.  Zoster vaccine. One dose is recommended for adults aged 68 years or older unless certain conditions are present.    PREVNAR - Pneumococcal 13-valent conjugate (PCV13) vaccine. When indicated,  a person who is uncertain of his immunization history and has no record of immunization should receive the PCV13 vaccine. An adult aged 80 years or older who has certain medical conditions and has not been previously immunized should receive 1 dose of PCV13 vaccine. This PCV13 should be followed with a dose of pneumococcal polysaccharide (PPSV23) vaccine. The PPSV23 vaccine dose should be obtained 1 or  more year(s)after the dose of PCV13 vaccine. An adult aged 35 years or older who has certain medical conditions and previously received 1 or more doses of PPSV23 vaccine should receive 1 dose of PCV13. The PCV13 vaccine dose should be obtained 1 or more years after the last PPSV23 vaccine dose.    PNEUMOVAX - Pneumococcal polysaccharide (PPSV23) vaccine. When PCV13 is also indicated, PCV13 should be obtained first. All adults aged 74 years and older should be immunized. An adult younger than age 31 years who has certain medical conditions should be immunized. Any person who resides in a nursing home or long-term care facility should be immunized. An adult smoker should be immunized. People with an immunocompromised condition and certain other conditions should receive both PCV13 and PPSV23 vaccines. People with human immunodeficiency virus (HIV) infection should be immunized as soon as possible after diagnosis. Immunization during chemotherapy or radiation therapy should be avoided. Routine use of PPSV23 vaccine is not recommended for American Indians, [place] Natives, or people younger than 65 years unless there are medical conditions that require PPSV23 vaccine. When indicated, people who have unknown immunization and have no record of immunization should receive PPSV23 vaccine. One-time revaccination 5 years after the first dose of PPSV23 is recommended for people aged 19-64 years who have chronic kidney failure, nephrotic syndrome, asplenia, or immunocompromised conditions. People who received 1-2 doses of PPSV23 before age 56 years should receive another dose of PPSV23 vaccine at age 30 years or later if at least 5 years have passed since the previous dose. Doses of PPSV23 are not needed for people immunized with PPSV23 at or after age 60 years.    Hepatitis A vaccine. Adults who wish to be protected from this disease, have certain high-risk conditions, work with hepatitis A-infected animals, work in  hepatitis A research labs, or travel to or work in countries with a high rate of hepatitis A should be immunized. Adults who were previously unvaccinated and who anticipate close contact with an international adoptee during the first 60 days after arrival in the [place] from a country with a high rate of hepatitis A should be immunized.    Hepatitis B vaccine. Adults should be immunized if they wish to be protected from this disease, have certain high-risk conditions, may be exposed to blood or other infectious body fluids, are household contacts or sex partners of hepatitis B positive people, are clients or workers in certain care facilities, or travel to or work in countries with a high rate of hepatitis B.   Preventive Service / Frequency   Ages 65 and over  Blood pressure check.  Lipid and cholesterol check.  Lung cancer screening. / Every year if you are aged 67-80 years and have a 30-pack-year history of smoking and currently smoke or have quit within the past 15 years. Yearly screening is stopped once you have quit smoking for at least 15 years or develop a health problem that would prevent you from having lung cancer treatment.  Fecal occult blood test (FOBT) of stool. You may not have to do this test if  you get a colonoscopy every 10 years.  Flexible sigmoidoscopy** or colonoscopy.** / Every 5 years for a flexible sigmoidoscopy or every 10 years for a colonoscopy beginning at age 8 and continuing until age 92.  Hepatitis C blood test.** / For all people born from [birth year] through 1963 and any individual with known risks for hepatitis C.  Abdominal aortic aneurysm (AAA) screening./ Screening current or former smokers or have Hypertension.  Skin self-exam. / Monthly.  Influenza vaccine. / Every year.  Tetanus, diphtheria, and acellular pertussis (Tdap/Td) vaccine.** / 1 dose of Td every 10 years.   Zoster vaccine.** / 1 dose for adults aged 22 years or older.          Pneumococcal 13-valent conjugate (PCV13) vaccine.    Pneumococcal polysaccharide (PPSV23) vaccine.     Hepatitis A vaccine.** / Consult your health care provider.  Hepatitis B vaccine.** / Consult your health care provider. Screening for abdominal aortic aneurysm (AAA)  by ultrasound is recommended for people who have history of high blood pressure or who are current or former smokers. ++++++++++ Recommend Adult Low Dose Aspirin or  coated  Aspirin 81 mg daily  To reduce risk of Colon Cancer 40 %,  Skin Cancer 26 % ,  Malignant Melanoma 46%  and  Pancreatic cancer 60% ++++++++++++++++++++++ Vitamin D goal  is between 70-100.  Please make sure that you are taking your Vitamin D as directed.  It is very important as a natural anti-inflammatory  helping hair, skin, and nails, as well as reducing stroke and heart attack risk.  It helps your bones and helps with mood. It also decreases numerous cancer risks so please take it as directed.  Low Vit D is associated with a 200-300% higher risk for CANCER  and 200-300% higher risk for HEART   ATTACK  &  STROKE.   .....................................Marland Kitchen It is also associated with higher death rate at younger ages,  autoimmune diseases like Rheumatoid arthritis, Lupus, Multiple Sclerosis.    Also many other serious conditions, like depression, Alzheimer's Dementia, infertility, muscle aches, fatigue, fibromyalgia - just to name a few. ++++++++++++++++++++++ Recommend the book "The END of DIETING" by Dr Excell Seltzer  & the book "The END of DIABETES " by Dr Excell Seltzer At [email or web address] - get book & Audio CD's    Being diabetic has a  300% increased risk for heart attack, stroke, cancer, and alzheimer- type vascular dementia. It is very important that you work harder with diet by avoiding all foods that are white. Avoid white rice (brown & wild rice is OK), white potatoes (sweetpotatoes in moderation is OK), White bread or wheat bread or anything  made out of white flour like bagels, donuts, rolls, buns, biscuits, cakes, pastries, cookies, pizza crust, and pasta (made from white flour & egg whites) - vegetarian pasta or spinach or wheat pasta is OK. Multigrain breads like Arnold's or [employer], or multigrain sandwich thins or flatbreads.  Diet, exercise and weight loss can reverse and cure diabetes in the early stages.  Diet, exercise and weight loss is very important in the control and prevention of complications of diabetes which affects every system in your body, ie. Brain - dementia/stroke, eyes - glaucoma/blindness, heart - heart attack/heart failure, kidneys - dialysis, stomach - gastric paralysis, intestines - malabsorption, nerves - severe painful neuritis, circulation - gangrene & loss of a leg(s), and finally cancer and Alzheimers.    I recommend avoid fried & greasy foods,  sweets/candy, white rice (  brown or wild rice or Quinoa is OK), white potatoes (sweet potatoes are OK) - anything made from white flour - bagels, doughnuts, rolls, buns, biscuits,white and wheat breads, pizza crust and traditional pasta made of white flour & egg white(vegetarian pasta or spinach or wheat pasta is OK).  Multi-grain bread is OK - like multi-grain flat bread or sandwich thins. Avoid alcohol in excess. Exercise is also important.    Eat all the vegetables you want - avoid meat, especially red meat and dairy - especially cheese.  Cheese is the most concentrated form of trans-fats which is the worst thing to clog up our arteries. Veggie cheese is OK which can be found in the fresh produce section at [employer] or [employer] or [employer]  ++++++++++++++++++++++ DASH Eating Plan  DASH stands for "Dietary Approaches to Stop Hypertension."   The DASH eating plan is a healthy eating plan that has been shown to reduce high blood pressure (hypertension). Additional health benefits may include reducing the risk of type 2 diabetes mellitus, heart disease,  and stroke. The DASH eating plan may also help with weight loss. WHAT DO I NEED TO KNOW ABOUT THE DASH EATING PLAN? For the DASH eating plan, you will follow these general guidelines:  Choose foods with a percent daily value for sodium of less than 5% (as listed on the food label).  Use salt-free seasonings or herbs instead of table salt or sea salt.  Check with your health care provider or pharmacist before using salt substitutes.  Eat lower-sodium products, often labeled as "lower sodium" or "no salt added."  Eat fresh foods.  Eat more vegetables, fruits, and low-fat dairy products.  Choose whole grains. Look for the word "whole" as the first word in the ingredient list.  Choose fish   Limit sweets, desserts, sugars, and sugary drinks.  Choose heart-healthy fats.  Eat veggie cheese   Eat more home-cooked food and less restaurant, buffet, and fast food.  Limit fried foods.  Cook foods using methods other than frying.  Limit canned vegetables. If you do use them, rinse them well to decrease the sodium.  When eating at a restaurant, ask that your food be prepared with less salt, or no salt if possible.                      WHAT FOODS CAN I EAT? Read Dr Fara Olden Fuhrman's books on The End of Dieting & The End of Diabetes  Grains Whole grain or whole wheat bread. Brown rice. Whole grain or whole wheat pasta. Quinoa, bulgur, and whole grain cereals. Low-sodium cereals. Corn or whole wheat flour tortillas. Whole grain cornbread. Whole grain crackers. Low-sodium crackers.  Vegetables Fresh or frozen vegetables (raw, steamed, roasted, or grilled). Low-sodium or reduced-sodium tomato and vegetable juices. Low-sodium or reduced-sodium tomato sauce and paste. Low-sodium or reduced-sodium canned vegetables.   Fruits All fresh, canned (in natural juice), or frozen fruits.  Protein Products  All fish and seafood.  Dried beans, peas, or lentils. Unsalted nuts and seeds. Unsalted canned  beans.  Dairy Low-fat dairy products, such as skim or 1% milk, 2% or reduced-fat cheeses, low-fat ricotta or cottage cheese, or plain low-fat yogurt. Low-sodium or reduced-sodium cheeses.  Fats and Oils Tub margarines without trans fats. Light or reduced-fat mayonnaise and salad dressings (reduced sodium). Avocado. Safflower, olive, or canola oils. Natural peanut or almond butter.  Other Unsalted popcorn and pretzels. The items listed above may not be a complete list  of recommended foods or beverages. Contact your dietitian for more options.  ++++++++++++++++++++  WHAT FOODS ARE NOT RECOMMENDED? Grains/ White flour or wheat flour White bread. White pasta. White rice. Refined cornbread. Bagels and croissants. Crackers that contain trans fat.  Vegetables  Creamed or fried vegetables. Vegetables in a . Regular canned vegetables. Regular canned tomato sauce and paste. Regular tomato and vegetable juices.  Fruits Dried fruits. Canned fruit in light or heavy syrup. Fruit juice.  Meat and Other Protein Products Meat in general - RED meat & White meat.  Fatty cuts of meat. Ribs, chicken wings, all processed meats as bacon, sausage, bologna, salami, fatback, hot dogs, bratwurst and packaged luncheon meats.  Dairy Whole or 2% milk, cream, half-and-half, and cream cheese. Whole-fat or sweetened yogurt. Full-fat cheeses or blue cheese. Non-dairy creamers and whipped toppings. Processed cheese, cheese spreads, or cheese curds.  Condiments Onion and garlic salt, seasoned salt, table salt, and sea salt. Canned and packaged gravies. Worcestershire sauce. Tartar sauce. Barbecue sauce. Teriyaki sauce. Soy sauce, including reduced sodium. Steak sauce. Fish sauce. Oyster sauce. Cocktail sauce. Horseradish. Ketchup and mustard. Meat flavorings and tenderizers. Bouillon cubes. Hot sauce. Tabasco sauce. Marinades. Taco seasonings. Relishes.  Fats and Oils Butter, stick margarine, lard, shortening and  bacon fat. Coconut, palm kernel, or palm oils. Regular salad dressings.  Pickles and olives. Salted popcorn and pretzels.  The items listed above may not be a complete list of foods and beverages to avoid.

## 2018-12-27 LAB — CBC WITH DIFFERENTIAL/PLATELET
Absolute Monocytes: 573 cells/uL (ref 200–950)
Basophils Absolute: 85 cells/uL (ref 0–200)
Basophils Relative: 0.9 %
Eosinophils Absolute: 94 cells/uL (ref 15–500)
Eosinophils Relative: 1 %
HCT: 42.2 % (ref 38.5–50.0)
Hemoglobin: 14 g/dL (ref 13.2–17.1)
Lymphs Abs: 4098 cells/uL — ABNORMAL HIGH (ref 850–3900)
MCH: 31.3 pg (ref 27.0–33.0)
MCHC: 33.2 g/dL (ref 32.0–36.0)
MCV: 94.4 fL (ref 80.0–100.0)
MPV: 10.7 fL (ref 7.5–12.5)
Monocytes Relative: 6.1 %
Neutro Abs: 4550 cells/uL (ref 1500–7800)
Neutrophils Relative %: 48.4 %
Platelets: 206 10*3/uL (ref 140–400)
RBC: 4.47 10*6/uL (ref 4.20–5.80)
RDW: 12.1 % (ref 11.0–15.0)
Total Lymphocyte: 43.6 %
WBC: 9.4 10*3/uL (ref 3.8–10.8)

## 2018-12-27 LAB — COMPLETE METABOLIC PANEL WITH GFR
AG Ratio: 2.3 (calc) (ref 1.0–2.5)
ALT: 18 U/L (ref 9–46)
AST: 23 U/L (ref 10–35)
Albumin: 4.4 g/dL (ref 3.6–5.1)
Alkaline phosphatase (APISO): 76 U/L (ref 35–144)
BUN: 12 mg/dL (ref 7–25)
CO2: 28 mmol/L (ref 20–32)
Calcium: 9.7 mg/dL (ref 8.6–10.3)
Chloride: 106 mmol/L (ref 98–110)
Creat: 0.99 mg/dL (ref 0.70–1.11)
GFR, Est African American: 82 mL/min/{1.73_m2} (ref >=60)
GFR, Est Non African American: 71 mL/min/{1.73_m2} (ref >=60)
Globulin: 1.9 g/dL (calc) (ref 1.9–3.7)
Glucose, Bld: 94 mg/dL (ref 65–99)
Potassium: 4.6 mmol/L (ref 3.5–5.3)
Sodium: 142 mmol/L (ref 135–146)
Total Bilirubin: 0.8 mg/dL (ref 0.2–1.2)
Total Protein: 6.3 g/dL (ref 6.1–8.1)

## 2018-12-27 LAB — LIPID PANEL
Cholesterol: 143 mg/dL (ref <200)
HDL: 47 mg/dL (ref >=40)
LDL Cholesterol (Calc): 73 mg/dL (calc)
Non-HDL Cholesterol (Calc): 96 mg/dL (calc) (ref <130)
Total CHOL/HDL Ratio: 3 (calc) (ref <5.0)
Triglycerides: 147 mg/dL (ref <150)

## 2018-12-27 LAB — HEMOGLOBIN A1C
Hgb A1c MFr Bld: 5.5 % of total Hgb (ref <5.7)
Mean Plasma Glucose: 111 (calc)
eAG (mmol/L): 6.2 (calc)

## 2018-12-27 LAB — VITAMIN D 25 HYDROXY (VIT D DEFICIENCY, FRACTURES): Vit D, 25-Hydroxy: 55 ng/mL (ref 30–100)

## 2018-12-27 LAB — TSH: TSH: 1 mIU/L (ref 0.40–4.50)

## 2019-01-14 ENCOUNTER — Encounter (INDEPENDENT_AMBULATORY_CARE_PROVIDER_SITE_OTHER): Payer: PPO | Admitting: Ophthalmology

## 2019-01-14 DIAGNOSIS — H2513 Age-related nuclear cataract, bilateral: Secondary | ICD-10-CM | POA: Diagnosis not present

## 2019-01-14 DIAGNOSIS — I10 Essential (primary) hypertension: Secondary | ICD-10-CM

## 2019-01-14 DIAGNOSIS — H34812 Central retinal vein occlusion, left eye, with macular edema: Secondary | ICD-10-CM | POA: Diagnosis not present

## 2019-01-14 DIAGNOSIS — H43813 Vitreous degeneration, bilateral: Secondary | ICD-10-CM | POA: Diagnosis not present

## 2019-01-14 DIAGNOSIS — H35033 Hypertensive retinopathy, bilateral: Secondary | ICD-10-CM | POA: Diagnosis not present

## 2019-02-27 ENCOUNTER — Ambulatory Visit: Payer: PPO | Attending: Internal Medicine

## 2019-02-27 DIAGNOSIS — Z23 Encounter for immunization: Secondary | ICD-10-CM

## 2019-02-27 NOTE — Progress Notes (Signed)
   Covid-19 Vaccination Clinic  Name:  Raymond Patrick    MRN: [id number] DOB: [birth year]  02/27/2019  [name] was observed post Covid-19 immunization for 15 minutes without incidence. He was provided with Vaccine Information Sheet and instruction to access the V-Safe system.   [name] was instructed to call 911 with any severe reactions post vaccine: Marland Kitchen Difficulty breathing  . Swelling of your face and throat  . A fast heartbeat  . A bad rash all over your body  . Dizziness and weakness    Immunizations Administered    Name Date Dose VIS Date Route   Pfizer COVID-19 Vaccine 02/27/2019  9:54 AM 0.3 mL 01/17/2019 Intramuscular   Manufacturer: [place]   Lot: [id number]   [place]: [address]-3

## 2019-03-19 ENCOUNTER — Ambulatory Visit: Payer: PPO | Attending: Internal Medicine

## 2019-03-19 DIAGNOSIS — Z23 Encounter for immunization: Secondary | ICD-10-CM | POA: Insufficient documentation

## 2019-03-19 NOTE — Progress Notes (Signed)
   Covid-19 Vaccination Clinic  Name:  Raymond Patrick    MRN: [id number] DOB: Aug 16, 1935  03/19/2019  [name] was observed post Covid-19 immunization for 15 minutes without incidence. He was provided with Vaccine Information Sheet and instruction to access the V-Safe system.   [name] was instructed to call 911 with any severe reactions post vaccine: Marland Kitchen Difficulty breathing  . Swelling of your face and throat  . A fast heartbeat  . A bad rash all over your body  . Dizziness and weakness    Immunizations Administered    Name Date Dose VIS Date Route   Pfizer COVID-19 Vaccine 03/19/2019  2:52 PM 0.3 mL 01/17/2019 Intramuscular   Manufacturer: [place]   Lot: [id number]   [place]: [id number]

## 2019-03-25 ENCOUNTER — Encounter (INDEPENDENT_AMBULATORY_CARE_PROVIDER_SITE_OTHER): Payer: PPO | Admitting: Ophthalmology

## 2019-03-25 DIAGNOSIS — H35033 Hypertensive retinopathy, bilateral: Secondary | ICD-10-CM

## 2019-03-25 DIAGNOSIS — I10 Essential (primary) hypertension: Secondary | ICD-10-CM | POA: Diagnosis not present

## 2019-03-25 DIAGNOSIS — H43813 Vitreous degeneration, bilateral: Secondary | ICD-10-CM

## 2019-03-25 DIAGNOSIS — H2513 Age-related nuclear cataract, bilateral: Secondary | ICD-10-CM | POA: Diagnosis not present

## 2019-03-25 DIAGNOSIS — H34812 Central retinal vein occlusion, left eye, with macular edema: Secondary | ICD-10-CM

## 2019-03-27 ENCOUNTER — Encounter: Payer: Self-pay | Admitting: Internal Medicine

## 2019-04-06 ENCOUNTER — Encounter: Payer: Self-pay | Admitting: Internal Medicine

## 2019-04-06 NOTE — Progress Notes (Signed)
Annual  Screening/Preventative Visit  & Comprehensive Evaluation & Examination     This very nice 84 y.o. single WM  presents for a Screening /Preventative Visit & comprehensive evaluation and management of multiple medical co-morbidities.  Patient has been followed for HTN, HLD, Prediabetes and Vitamin D Deficiency.     Patient has hx/o RUL Lung cancer resected by [facility] in 2008 and in 2013 was treated for Prostate and Bladder cancers      HTN predates since 1998. Patient's BP has been controlled at home.  Today's BP is at goal - 130/76. In  2018, he had a Normal / Negative Stress Myoview.   Patient has Aortic atherosclerosis by CT scan in 2020. Patient denies any cardiac symptoms as chest pain, palpitations, shortness of breath, dizziness or ankle swelling.     Patient's hyperlipidemia is controlled with diet and Rosuvastatin . Patient denies myalgias or other medication SE's. Last lipids were at goal:  Lab Results  Component Value Date   CHOL 143 12/26/2018   HDL 47 12/26/2018   LDLCALC 73 12/26/2018   TRIG 147 12/26/2018   CHOLHDL 3.0 12/26/2018       Patient has hx/o prediabetes (A1c 6.2% / 2011)and patient denies reactive hypoglycemic symptoms, visual blurring, diabetic polys or paresthesias. Last A1c was Normal & at goal:  Lab Results  Component Value Date   HGBA1C 5.5 12/26/2018        Finally, patient has history of Vitamin D Deficiency ("30" / 2008)  and last vitamin D was slightly low (goal 70-100):  Lab Results  Component Value Date   VD25OH 55 12/26/2018    Current Outpatient Medications on File Prior to Visit  Medication Sig  . aspirin 81 MG tablet Take 81 mg by mouth daily.  Marland Kitchen cZYRTEC 10 MG tablet Take 1 tablet ( daily.  Marland Kitchen VITAMIN D 2000 UNITS  Take 8,000 Units  daily.   . MULTIVIT/MINERALS Take 1 tablet by mouth every evening.   . Omega-3 FISH OIL 1000 MG Take 1 capsule by mouth every evening.   . rosuvastatin  10 MG tablet Take 10 mg by mouth daily.  .  vitamin C  500 MG tablet Take 500 mg by mouth daily.    Allergies  Allergen Reactions  . Nitrofuran Derivatives Shortness Of Breath    macrobid   Past Medical History:  Diagnosis Date  . Allergy    seasonal  . Bladder tumor   . Cancer ([place])    bladder,, lung  . COPD (chronic obstructive pulmonary disease) ([place])   . Diverticulosis of sigmoid colon    2016  . History of kidney stones   . History of lung cancer 2008  S/P RIGHT UPPER LOPECTOMY    STAGE I  NON-SMALL CELL LUNG CANCER--  ONCOLOGIST- DR MOHAMED-   NO RECURRENCE  . Hyperlipidemia   . Hypertension   . Impaired hearing bilateral aids  . Irregular heart rate    per pt heart will skip a beat   . Unspecified vitamin D deficiency    Health Maintenance  Topic Date Due  . URINE MICROALBUMIN  03/12/2019  . TETANUS/TDAP  03/09/2020  . COLONOSCOPY  12/10/2021  . INFLUENZA VACCINE  Completed  . PNA vac Low Risk Adult  Completed   Immunization History  Administered Date(s) Administered  . Influenza, High Dose Seasonal PF 11/03/2014, 11/02/2016, 11/28/2017, 11/30/2018  . Influenza,inj,Quad PF,6+ Mos 11/09/2015  . Influenza-Unspecified 11/06/2012  . PFIZER SARS-COV-2 Vaccination 02/27/2019, 03/19/2019  .  Pneumococcal Conjugate-13 01/20/2014  . Pneumococcal-Unspecified 02/06/2002, 09/23/2012  . Tdap 03/09/2010  . Zoster 08/03/2014   Last Colon - 11.04.2020 - Dr Havery Moros - Recc no f/u due to age  Past Surgical History:  Procedure Laterality Date  . COLONOSCOPY  05-09-2004   HPP and tics [place]   . EXTRACORPOREAL SHOCK WAVE LITHOTRIPSY Left 04/18/2018   Procedure: EXTRACORPOREAL SHOCK WAVE LITHOTRIPSY (ESWL);  Surgeon: Festus Aloe, MD;  Location: [facility];  Service: Urology;  Laterality: Left;  . FIBEROPTIC BRONCHOSCOPY  10-28-2008  . LITHOTRIPSY  2012  . LUNG REMOVAL, PARTIAL  2008  . PROSTATE SURGERY     prostatectomy  . RIGHT UPPER LUNG LOBECTOMY  09-21-2006  DR BURNEY   NON-SMALL CELL CANCER/ SEVERE COPD  .  TRANSURETHRAL RESECTION OF BLADDER TUMOR  12/25/2011   Procedure: TRANSURETHRAL RESECTION OF BLADDER TUMOR (TURBT);  Surgeon: Franchot Gallo, MD;  Location: [facility];  Service: Urology;  Laterality: N/A;  . TRANSURETHRAL RESECTION OF PROSTATE  12/25/2011   Procedure: TRANSURETHRAL RESECTION OF THE PROSTATE WITH GYRUS INSTRUMENTS;  Surgeon: Franchot Gallo, MD;  Location: [facility];  Service: Urology;  Laterality: N/A;  2 HRS    Family History  Problem Relation Age of Onset  . Cancer Mother        Non-Hodgkin's Lymphoma  . Diabetes Mother   . Hypertension Mother   . Colon polyps Mother   . Cancer Father        Colon cancer w/ metastasis to lung & bone  . Colon cancer Father   . Cancer Brother 77       Lung cancer  . Diabetes Maternal Grandmother   . Heart failure Maternal Grandmother   . Cancer Brother 65       Non-small cell Lung cancer  . Pneumonia Maternal Grandfather   . Esophageal cancer Neg Hx   . Rectal cancer Neg Hx   . Stomach cancer Neg Hx    Social History   Socioeconomic History  . Marital status: Single  . Number of children: none  Occupational History  . Occupation: retired  Tobacco Use  . Smoking status: Former Smoker    Packs/day: 1.00    Years: 35.00    Pack years: 35.00    Types: Cigarettes    Quit date: 02/07/2004    Years since quitting: 15.1  . Smokeless tobacco: Never Used  Substance and Sexual Activity  . Alcohol use: Yes    Alcohol/week: 0.0 standard drinks    Comment: socially  . Drug use: No  . Sexual activity: Not on file   ROS Constitutional: Denies fever, chills, weight loss/gain, headaches, insomnia,  night sweats or change in appetite. Does c/o fatigue. Eyes: Denies redness, blurred vision, diplopia, discharge, itchy or watery eyes.  ENT: Denies discharge, congestion, post nasal drip, epistaxis, sore throat, earache, hearing loss, dental pain, Tinnitus, Vertigo, Sinus pain or snoring.  Cardio:  Denies chest pain, palpitations, irregular heartbeat, syncope, dyspnea, diaphoresis, orthopnea, PND, claudication or edema Respiratory: denies cough, dyspnea, DOE, pleurisy, hoarseness, laryngitis or wheezing.  Gastrointestinal: Denies dysphagia, heartburn, reflux, water brash, pain, cramps, nausea, vomiting, bloating, diarrhea, constipation, hematemesis, melena, hematochezia, jaundice or hemorrhoids Genitourinary: Denies dysuria, frequency, discharge, hematuria or flank pain. Has urgency, nocturia x 2-3 & occasional hesitancy. Musculoskeletal: Denies arthralgia, myalgia, stiffness, Jt. Swelling, pain, limp or strain/sprain. Denies Falls. Skin: Denies puritis, rash, hives, warts, acne, eczema or change in skin lesion Neuro: No weakness, tremor, incoordination, spasms, paresthesia or pain Psychiatric:  Denies confusion, memory loss or sensory loss. Denies Depression. Endocrine: Denies change in weight, skin, hair change, nocturia, and paresthesia, diabetic polys, visual blurring or hyper / hypo glycemic episodes.  Heme/Lymph: No excessive bleeding, bruising or enlarged lymph nodes.  Physical Exam  BP 130/76   Pulse 76   Temp (!) 97.1 F (36.2 C)   Resp 16   Ht 5' 4.5" (1.638 m)   Wt 166 lb 9.6 oz (75.6 kg)   BMI 28.16 kg/m   General Appearance: Well nourished and well groomed and in no apparent distress.  Eyes: PERRLA, EOMs, conjunctiva no swelling or erythema, normal fundi and vessels. Sinuses: No frontal/maxillary tenderness ENT/Mouth: EACs patent / TMs  nl. Nares clear without erythema, swelling, mucoid exudates. Oral hygiene is good. No erythema, swelling, or exudate. Tongue normal, non-obstructing. Tonsils not swollen or erythematous. Hearing normal.  Neck: Supple, thyroid not palpable. No bruits, nodes or JVD. Respiratory: Respiratory effort normal.  BS equal and clear bilateral without rales, rhonci, wheezing or stridor. Cardio: Heart sounds are normal with regular rate and rhythm  and no murmurs, rubs or gallops. Peripheral pulses are normal and equal bilaterally without edema. No aortic or femoral bruits. Chest: symmetric with normal excursions and percussion.  Abdomen: Soft, with Nl bowel sounds. Nontender, no guarding, rebound, hernias, masses, or organomegaly.  Lymphatics: Non tender without lymphadenopathy.  Musculoskeletal: Full ROM all peripheral extremities, joint stability, 5/5 strength, and normal gait. Skin: Warm and dry without rashes, lesions, cyanosis, clubbing or  ecchymosis.  Neuro: Cranial nerves intact, reflexes equal bilaterally. Normal muscle tone, no cerebellar symptoms. Sensation intact.  Pysch: Alert and oriented X 3 with normal affect, insight and judgment appropriate.   Assessment and Plan  1. Annual Preventative/Screening Exam   2. Essential hypertension  - EKG 12-Lead - [place], RETROPERITNL ABD,  LTD - Urinalysis, Routine w reflex microscopic - Microalbumin / creatinine urine ratio - CBC with Differential/Platelet - COMPLETE METABOLIC PANEL WITH GFR - Magnesium - TSH  3. Hyperlipidemia, mixed  - EKG 12-Lead - [place], RETROPERITNL ABD,  LTD - Lipid panel - TSH  4. Abnormal glucose  - EKG 12-Lead - [place], RETROPERITNL ABD,  LTD - Hemoglobin A1c - Insulin, random  5. Vitamin D deficiency  - VITAMIN D 25 Hydroxy  6. Prediabetes  - EKG 12-Lead - [place], RETROPERITNL ABD,  LTD - Hemoglobin A1c - Insulin, random  7. Chronic obstructive pulmonary disease, ([place])   8. BPH with obstruction/lower urinary tract symptoms  - PSA  9. Screening for colorectal cancer  - POC Hemoccult Bld/Stl   10. Prostate cancer screening  - PSA  11. History of lung cancer   12. History of prostate cancer   13. History of bladder cancer   14. Screening for ischemic heart disease  - EKG 12-Lead  15. FHx: heart disease  - EKG 12-Lead - [place], RETROPERITNL ABD,  LTD  16. Former smoker  - EKG 12-Lead - [place], RETROPERITNL ABD,  LTD  17.  Aortic atherosclerosis (HCC)  - [place], RETROPERITNL ABD,  LTD  18. Screening for AAA (aortic abdominal aneurysm)  - [place], RETROPERITNL ABD,  LTD  19. Medication management  - Urinalysis, Routine w reflex microscopic - Microalbumin / creatinine urine ratio - CBC with Differential/Platelet - COMPLETE METABOLIC PANEL WITH GFR - Magnesium - Lipid panel - TSH - Hemoglobin A1c - Insulin, random - VITAMIN D 25 Hydroxy        Patient was counseled in prudent diet, weight control to achieve/maintain BMI  less than 25, BP monitoring, regular exercise and medications as discussed.  Discussed med effects and SE's. Routine screening labs and tests as requested with regular follow-up as recommended. Over 40 minutes of exam, counseling, chart review and high complex critical decision making was performed   Kirtland Bouchard, MD

## 2019-04-06 NOTE — Patient Instructions (Signed)

## 2019-04-07 ENCOUNTER — Ambulatory Visit (INDEPENDENT_AMBULATORY_CARE_PROVIDER_SITE_OTHER): Payer: PPO | Admitting: Internal Medicine

## 2019-04-07 ENCOUNTER — Ambulatory Visit
Admission: RE | Admit: 2019-04-07 | Discharge: 2019-04-07 | Disposition: A | Discharge status: Home / Self Care | Payer: PPO | Source: Ambulatory Visit | Attending: Internal Medicine | Admitting: Internal Medicine

## 2019-04-07 ENCOUNTER — Other Ambulatory Visit: Payer: Self-pay

## 2019-04-07 VITALS — BP 130/76 | HR 76 | Temp 97.1°F | Resp 16 | Ht 64.5 in | Wt 166.6 lb

## 2019-04-07 DIAGNOSIS — N138 Other obstructive and reflux uropathy: Secondary | ICD-10-CM

## 2019-04-07 DIAGNOSIS — Z1212 Encounter for screening for malignant neoplasm of rectum: Secondary | ICD-10-CM

## 2019-04-07 DIAGNOSIS — I10 Essential (primary) hypertension: Secondary | ICD-10-CM

## 2019-04-07 DIAGNOSIS — Z8551 Personal history of malignant neoplasm of bladder: Secondary | ICD-10-CM

## 2019-04-07 DIAGNOSIS — Z87891 Personal history of nicotine dependence: Secondary | ICD-10-CM

## 2019-04-07 DIAGNOSIS — J449 Chronic obstructive pulmonary disease, unspecified: Secondary | ICD-10-CM

## 2019-04-07 DIAGNOSIS — Z0001 Encounter for general adult medical examination with abnormal findings: Secondary | ICD-10-CM

## 2019-04-07 DIAGNOSIS — Z136 Encounter for screening for cardiovascular disorders: Secondary | ICD-10-CM | POA: Diagnosis not present

## 2019-04-07 DIAGNOSIS — N401 Enlarged prostate with lower urinary tract symptoms: Secondary | ICD-10-CM | POA: Diagnosis not present

## 2019-04-07 DIAGNOSIS — Z79899 Other long term (current) drug therapy: Secondary | ICD-10-CM | POA: Diagnosis not present

## 2019-04-07 DIAGNOSIS — E559 Vitamin D deficiency, unspecified: Secondary | ICD-10-CM | POA: Diagnosis not present

## 2019-04-07 DIAGNOSIS — Z125 Encounter for screening for malignant neoplasm of prostate: Secondary | ICD-10-CM | POA: Diagnosis not present

## 2019-04-07 DIAGNOSIS — Z8249 Family history of ischemic heart disease and other diseases of the circulatory system: Secondary | ICD-10-CM | POA: Diagnosis not present

## 2019-04-07 DIAGNOSIS — R7303 Prediabetes: Secondary | ICD-10-CM

## 2019-04-07 DIAGNOSIS — Z1211 Encounter for screening for malignant neoplasm of colon: Secondary | ICD-10-CM

## 2019-04-07 DIAGNOSIS — Z85118 Personal history of other malignant neoplasm of bronchus and lung: Secondary | ICD-10-CM

## 2019-04-07 DIAGNOSIS — Z0000 Encounter for general adult medical examination without abnormal findings: Secondary | ICD-10-CM

## 2019-04-07 DIAGNOSIS — Z8546 Personal history of malignant neoplasm of prostate: Secondary | ICD-10-CM

## 2019-04-07 DIAGNOSIS — R7309 Other abnormal glucose: Secondary | ICD-10-CM | POA: Diagnosis not present

## 2019-04-07 DIAGNOSIS — E782 Mixed hyperlipidemia: Secondary | ICD-10-CM

## 2019-04-07 DIAGNOSIS — I700 Atherosclerosis of aorta: Secondary | ICD-10-CM

## 2019-04-08 LAB — INSULIN, RANDOM: Insulin: 7.1 u[IU]/mL

## 2019-04-08 LAB — COMPLETE METABOLIC PANEL WITH GFR
AG Ratio: 2 (calc) (ref 1.0–2.5)
ALT: 19 U/L (ref 9–46)
AST: 22 U/L (ref 10–35)
Albumin: 4.4 g/dL (ref 3.6–5.1)
Alkaline phosphatase (APISO): 87 U/L (ref 35–144)
BUN: 13 mg/dL (ref 7–25)
CO2: 28 mmol/L (ref 20–32)
Calcium: 9.5 mg/dL (ref 8.6–10.3)
Chloride: 105 mmol/L (ref 98–110)
Creat: 0.82 mg/dL (ref 0.70–1.11)
GFR, Est African American: 96 mL/min/{1.73_m2} (ref >=60)
GFR, Est Non African American: 83 mL/min/{1.73_m2} (ref >=60)
Globulin: 2.2 g/dL (calc) (ref 1.9–3.7)
Glucose, Bld: 91 mg/dL (ref 65–99)
Potassium: 4.2 mmol/L (ref 3.5–5.3)
Sodium: 140 mmol/L (ref 135–146)
Total Bilirubin: 0.8 mg/dL (ref 0.2–1.2)
Total Protein: 6.6 g/dL (ref 6.1–8.1)

## 2019-04-08 LAB — CBC WITH DIFFERENTIAL/PLATELET
Absolute Monocytes: 592 cells/uL (ref 200–950)
Basophils Absolute: 64 cells/uL (ref 0–200)
Basophils Relative: 0.7 %
Eosinophils Absolute: 118 cells/uL (ref 15–500)
Eosinophils Relative: 1.3 %
HCT: 40.8 % (ref 38.5–50.0)
Hemoglobin: 14.2 g/dL (ref 13.2–17.1)
Lymphs Abs: 4259 cells/uL — ABNORMAL HIGH (ref 850–3900)
MCH: 32.3 pg (ref 27.0–33.0)
MCHC: 34.8 g/dL (ref 32.0–36.0)
MCV: 92.7 fL (ref 80.0–100.0)
MPV: 11 fL (ref 7.5–12.5)
Monocytes Relative: 6.5 %
Neutro Abs: 4068 cells/uL (ref 1500–7800)
Neutrophils Relative %: 44.7 %
Platelets: 220 10*3/uL (ref 140–400)
RBC: 4.4 10*6/uL (ref 4.20–5.80)
RDW: 12.3 % (ref 11.0–15.0)
Total Lymphocyte: 46.8 %
WBC: 9.1 10*3/uL (ref 3.8–10.8)

## 2019-04-08 LAB — HEMOGLOBIN A1C
Hgb A1c MFr Bld: 5.6 % of total Hgb (ref <5.7)
Mean Plasma Glucose: 114 (calc)
eAG (mmol/L): 6.3 (calc)

## 2019-04-08 LAB — URINALYSIS, ROUTINE W REFLEX MICROSCOPIC
Bilirubin Urine: NEGATIVE
Glucose, UA: NEGATIVE
Hgb urine dipstick: NEGATIVE
Ketones, ur: NEGATIVE
Leukocytes,Ua: NEGATIVE
Nitrite: NEGATIVE
Protein, ur: NEGATIVE
Specific Gravity, Urine: 1.004 (ref 1.001–1.03)
pH: 7 (ref 5.0–8.0)

## 2019-04-08 LAB — MICROALBUMIN / CREATININE URINE RATIO
Creatinine, Urine: 27 mg/dL (ref 20–320)
Microalb Creat Ratio: 7 mcg/mg creat (ref <30)
Microalb, Ur: 0.2 mg/dL

## 2019-04-08 LAB — LIPID PANEL
Cholesterol: 139 mg/dL (ref <200)
HDL: 43 mg/dL (ref >=40)
LDL Cholesterol (Calc): 76 mg/dL (calc)
Non-HDL Cholesterol (Calc): 96 mg/dL (calc) (ref <130)
Total CHOL/HDL Ratio: 3.2 (calc) (ref <5.0)
Triglycerides: 118 mg/dL (ref <150)

## 2019-04-08 LAB — MAGNESIUM: Magnesium: 2.2 mg/dL (ref 1.5–2.5)

## 2019-04-08 LAB — VITAMIN D 25 HYDROXY (VIT D DEFICIENCY, FRACTURES): Vit D, 25-Hydroxy: 86 ng/mL (ref 30–100)

## 2019-04-08 LAB — TSH: TSH: 1.38 mIU/L (ref 0.40–4.50)

## 2019-04-08 LAB — PSA: PSA: 0.7 ng/mL (ref <=4.0)

## 2019-05-12 DIAGNOSIS — H903 Sensorineural hearing loss, bilateral: Secondary | ICD-10-CM | POA: Diagnosis not present

## 2019-05-21 DIAGNOSIS — H903 Sensorineural hearing loss, bilateral: Secondary | ICD-10-CM | POA: Diagnosis not present

## 2019-06-03 ENCOUNTER — Encounter (INDEPENDENT_AMBULATORY_CARE_PROVIDER_SITE_OTHER): Payer: PPO | Admitting: Ophthalmology

## 2019-06-03 DIAGNOSIS — H34812 Central retinal vein occlusion, left eye, with macular edema: Secondary | ICD-10-CM | POA: Diagnosis not present

## 2019-06-03 DIAGNOSIS — H43813 Vitreous degeneration, bilateral: Secondary | ICD-10-CM | POA: Diagnosis not present

## 2019-06-03 DIAGNOSIS — I10 Essential (primary) hypertension: Secondary | ICD-10-CM | POA: Diagnosis not present

## 2019-06-03 DIAGNOSIS — H35033 Hypertensive retinopathy, bilateral: Secondary | ICD-10-CM | POA: Diagnosis not present

## 2019-06-26 ENCOUNTER — Ambulatory Visit: Payer: PPO | Admitting: Adult Health

## 2019-07-08 ENCOUNTER — Encounter: Payer: Self-pay | Admitting: Adult Health

## 2019-07-08 DIAGNOSIS — N200 Calculus of kidney: Secondary | ICD-10-CM

## 2019-07-08 HISTORY — DX: Calculus of kidney: N20.0

## 2019-07-08 NOTE — Progress Notes (Signed)
MEDICARE ANNUAL WELLNESS VISIT AND FOLLOW UP Assessment:   Diagnoses and all orders for this visit:  Encounter for Medicare annual wellness exam Health Maintenance UTD Due annually   Aortic atherosclerosis ([place]) Per CT 03/2018 Control blood pressure, cholesterol, glucose, increase exercise.   Essential hypertension Continue medications Monitor blood pressure at home; call if consistently over 130/80 Continue DASH diet.   Reminder to go to the ER if any CP, SOB, nausea, dizziness, severe HA, changes vision/speech, left arm numbness and tingling and jaw pain.  Vitamin D deficiency At goal at recent check; continue to recommend supplementation for goal of 60-100 Defer vitamin D level  Other abnormal glucose Recent A1Cs at goal Discussed diet/exercise, weight management  Defer A1C; check CMP  Hyperlipidemia Continue rosuvastatin  Continue low cholesterol diet and exercise.  Check lipid panel.   Overweight (BMI 25.0-29.9) Long discussion about weight loss, diet, and exercise Recommended diet heavy in fruits and veggies and low in animal meats, cheeses, and dairy products, appropriate calorie intake Discussed appropriate weight for height  Follow up at next visit  Medication management CBC, CMP/GFR  History of smoking Has recent CXR;   History of lung cancer S/p VATS RUL lobectomy, 10 years without recurrence; Released by Dr. Earlie Server Following annual CXR, UTD  Hx of bladder/prostatin cancer denies urinary symptoms, followed by Dr. Beatrix Fetters, due for follow up, call to schedule  Hearing loss, bilateral Continue hearing aids   Insomnia Insomnia- good sleep hygiene discussed, increase day time activity, try going to sleep at night at consistent time Will try trazodone for mood and insomnia; take 25-100 mg 1 hour prior to bedtime  Over 30 minutes of exam, counseling, chart review, and critical decision making was performed  Future Appointments  Date Time Provider  [place]  08/19/2019  7:30 AM Hayden Pedro, MD [facility] None  10/15/2019 11:30 AM [name], MD [facility] None  04/15/2020 11:00 AM [name], MD [facility] None     Plan:   During the course of the visit the patient was educated and counseled about appropriate screening and preventive services including:    Pneumococcal vaccine   Influenza vaccine  Prevnar 13  Td vaccine  Screening electrocardiogram  Colorectal cancer screening  Diabetes screening  Glaucoma screening  Nutrition counseling    Subjective:  Raymond Patrick is a 84 y.o. male who presents for Medicare Annual Wellness Visit and 3 month follow up for HTN, hyperlipidemia, glucose management, and vitamin D Def.   Patient is s/p RUL lobectomy by VATS  for Primary Lung Cancer in 2008 followed by Dr Julien Nordmann (has since been released, doing annual CXR, last in March 2021) and also is s/p TURP for Prostate Cancer & Bladder cancer in 2013 by Dr Diona Fanti, continues to follow up with him doing annual cysto, recently negative in 03/2018. Also follows with hx of kidney stones, had lithotripsy last year without problems since.   Reports some difficulty sleeping in the last year, has been having trouble falling asleep, down mood since wasn't getting out to see people with the pandemic, has been up to 2 am but will still wake up at 7 am. Working on cutting back on the news. Enjoying being more social, trying to get out more.   BMI is Body mass index is 28.16 kg/m., he has been working on diet and exercise. He is very active and aims to get 10000 steps daily, walks 1-2 hours 3 days a week as well.  Wt Readings from  Last 3 Encounters:  07/09/19 166 lb 9.6 oz (75.6 kg)  04/07/19 166 lb 9.6 oz (75.6 kg)  12/26/18 165 lb (74.8 kg)   He has abdominal aortic atherosclerosis per Ct 03/2018.  His blood pressure has been controlled at home, today their BP is BP: 130/70 He does workout. He denies chest pain,  shortness of breath, dizziness.   He is on cholesterol medication (rosuvastatin 10 mg every other day, he has been on pravastatin and lipitor in the past) and denies myalgias. His cholesterol is at goal. The cholesterol last visit was:   Lab Results  Component Value Date   CHOL 139 04/07/2019   HDL 43 04/07/2019   LDLCALC 76 04/07/2019   TRIG 118 04/07/2019   CHOLHDL 3.2 04/07/2019   He has been working on diet and exercise for glucose management, and denies foot ulcerations, increased appetite, nausea, paresthesia of the feet, polydipsia, polyuria, visual disturbances and vomiting. Last A1C in the office was:  Lab Results  Component Value Date   HGBA1C 5.6 04/07/2019    Last GFR:  Lab Results  Component Value Date   GFRNONAA 83 04/07/2019    Patient is on Vitamin D supplement and at goal at recent check:   Lab Results  Component Value Date   VD25OH 86 04/07/2019      Medication Review: Current Outpatient Medications on File Prior to Visit  Medication Sig Dispense Refill  . aspirin 81 MG tablet Take 81 mg by mouth daily.    . cetirizine (ZYRTEC) 10 MG tablet Take 1 tablet (10 mg total) by mouth daily. One tab daily for allergies 30 tablet 3  . Cholecalciferol (VITAMIN D) 2000 UNITS CAPS Take 8,000 Units by mouth daily.     Marland Kitchen glucose blood (FREESTYLE TEST STRIPS) test strip Check blood sugar 1 time daily-DX-R73.03. 100 each 1  . glucose monitoring kit (FREESTYLE) monitoring kit Check blood sugar 1 time daily-DX-R73.03. 1 each 0  . Lancets (FREESTYLE) lancets Check blood sugar 1 time daily-DX-R73.03. 100 each 1  . Multiple Vitamin (MULTIVITAMIN WITH MINERALS) TABS Take 1 tablet by mouth every evening.     . Omega-3 Fatty Acids (FISH OIL) 1000 MG CAPS Take 1 capsule by mouth every evening.     . rosuvastatin (CRESTOR) 10 MG tablet Take 10 mg by mouth daily.    . vitamin C (ASCORBIC ACID) 500 MG tablet Take 500 mg by mouth daily.     No current facility-administered medications  on file prior to visit.    Allergies: Allergies  Allergen Reactions  . Nitrofuran Derivatives Shortness Of Breath    macrobid    Current Problems (verified) has Hyperlipidemia; History of smoking; Hypertension; Vitamin D deficiency; Other abnormal glucose; History of lung cancer; Overweight (BMI 25.0-29.9); Encounter for Medicare annual wellness exam; BPH (benign prostatic hyperplasia); Aortic atherosclerosis ([place]) by CT Scan; and Bilateral hearing loss on their problem list.  Screening Tests Health Maintenance  Topic Date Due  . INFLUENZA VACCINE  09/07/2019  . TETANUS/TDAP  03/09/2020  . URINE MICROALBUMIN  04/06/2020  . COLONOSCOPY  12/10/2021  . COVID-19 Vaccine  Completed  . PNA vac Low Risk Adult  Completed   Preventative care: Last colonoscopy: 12/2018, polyps, DONE per GI due to age   Prior vaccinations: TD or Tdap: 2012  Influenza: 11/2018  Pneumococcal: 2004, 2014 Prevnar13: 2015 Shingles/Zostavax: 2016 Covid 19: 2/2, 2021, pfizer   Names of Other Physician/Practitioners you currently use: 1. [facility] here for  primary care 2. Dr. Matthews/Gould, eye doctor, last visit 2020, mild Left cataract, has has L macular edema followed by Dr. Zigmund Daniel q7-8 weeks, doing injections 3. [employer], Pharmacist, community, last visit 2021  Patient Care Team: [name], MD as PCP - General (Internal Medicine) Franchot Gallo, MD as Consulting Physician (Urology) Curt Bears, MD as Consulting Physician (Oncology) Lorretta Harp, MD as Consulting Physician (Cardiology) Inda Castle, MD (Inactive) as Consulting Physician (Gastroenterology) Melissa Noon, [place] as Referring Physician (Optometry)  Surgical: He  has a past surgical history that includes Lung removal, partial (2008); Lithotripsy (2012); Fiberoptic bronchoscopy (10-28-2008); RIGHT UPPER LUNG LOBECTOMY (09-21-2006  DR Arlyce Dice); Transurethral resection of prostate  (12/25/2011); Transurethral resection of bladder tumor (12/25/2011); Prostate surgery; Colonoscopy (05-09-2004); and Extracorporeal shock wave lithotripsy (Left, 04/18/2018). Family His family history includes Cancer in his father and mother; Cancer (age of onset: 53) in his brother; Cancer (age of onset: 41) in his brother; Colon cancer in his father; Colon polyps in his mother; Diabetes in his maternal grandmother and mother; Heart failure in his maternal grandmother; Hypertension in his mother; Pneumonia in his maternal grandfather. Social history  He reports that he quit smoking about 15 years ago. His smoking use included cigarettes. He has a 35.00 pack-year smoking history. He has never used smokeless tobacco. He reports current alcohol use. He reports that he does not use drugs.  MEDICARE WELLNESS OBJECTIVES: Physical activity:   Cardiac risk factors:   Depression/mood screen:   Depression screen [facility] 2/9 04/06/2019  Decreased Interest 0  Down, Depressed, Hopeless 0  PHQ - 2 Score 0    ADLs:  In your present state of health, do you have any difficulty performing the following activities: 04/06/2019 09/28/2018  Hearing? N N  Vision? N N  Difficulty concentrating or making decisions? N N  Walking or climbing stairs? N N  Dressing or bathing? N N  Doing errands, shopping? N N  Some recent data might be hidden     Cognitive Testing  Alert? Yes  Normal Appearance?Yes  Oriented to person? Yes  Place? Yes   Time? Yes  Recall of three objects?  Yes  Can perform simple calculations? Yes  Displays appropriate judgment?Yes  Can read the correct time from a watch face?Yes  EOL planning:     Objective:   Today's Vitals   07/09/19 0956  BP: 130/70  Pulse: 66  Temp: (!) 96.4 F (35.8 C)  SpO2: 98%  Weight: 166 lb 9.6 oz (75.6 kg)  Height: 5' 4.5" (1.638 m)   Body mass index is 28.16 kg/m.  General appearance: alert, no distress, WD/WN, male HEENT: normocephalic, sclerae  anicteric, Bil ear canels clear, hearing aids, nares patent, no discharge or erythema, pharynx normal Oral cavity: MMM, no lesions Neck: supple, no lymphadenopathy, no thyromegaly, no masses Heart: RRR, normal S1, S2, no murmurs Lungs: CTA bilaterally, no wheezes, rhonchi, or rales Abdomen: +bs, soft, non tender, non distended, no masses, no hepatomegaly, no splenomegaly Musculoskeletal: nontender, no swelling, no obvious deformity Extremities: no edema, no cyanosis, no clubbing Pulses: 2+ symmetric, upper and lower extremities, normal cap refill Neurological: alert, oriented x 3, CN2-12 intact, strength normal upper extremities and lower extremities, sensation normal throughout, DTRs 2+ throughout, no cerebellar signs, gait normal Psychiatric: normal affect, behavior normal, pleasant   Medicare Attestation I have personally reviewed: The patient's medical and social history Their use of alcohol, tobacco or illicit drugs Their current medications and supplements The patient's functional ability including ADLs,fall  risks, home safety risks, cognitive, and hearing and visual impairment Diet and physical activities Evidence for depression or mood disorders  The patient's weight, height, BMI, and visual acuity have been recorded in the chart.  I have made referrals, counseling, and provided education to the patient based on review of the above and I have provided the patient with a written personalized care plan for preventive services.     Izora Ribas, NP   07/09/2019

## 2019-07-09 ENCOUNTER — Encounter: Payer: Self-pay | Admitting: Adult Health

## 2019-07-09 ENCOUNTER — Ambulatory Visit (INDEPENDENT_AMBULATORY_CARE_PROVIDER_SITE_OTHER): Payer: PPO | Admitting: Adult Health

## 2019-07-09 ENCOUNTER — Other Ambulatory Visit: Payer: Self-pay

## 2019-07-09 VITALS — BP 130/70 | HR 66 | Temp 96.4°F | Ht 64.5 in | Wt 166.6 lb

## 2019-07-09 DIAGNOSIS — E559 Vitamin D deficiency, unspecified: Secondary | ICD-10-CM

## 2019-07-09 DIAGNOSIS — R7309 Other abnormal glucose: Secondary | ICD-10-CM

## 2019-07-09 DIAGNOSIS — Z0000 Encounter for general adult medical examination without abnormal findings: Secondary | ICD-10-CM

## 2019-07-09 DIAGNOSIS — E782 Mixed hyperlipidemia: Secondary | ICD-10-CM | POA: Diagnosis not present

## 2019-07-09 DIAGNOSIS — Z85118 Personal history of other malignant neoplasm of bronchus and lung: Secondary | ICD-10-CM

## 2019-07-09 DIAGNOSIS — R6889 Other general symptoms and signs: Secondary | ICD-10-CM

## 2019-07-09 DIAGNOSIS — G4700 Insomnia, unspecified: Secondary | ICD-10-CM

## 2019-07-09 DIAGNOSIS — Z87891 Personal history of nicotine dependence: Secondary | ICD-10-CM | POA: Diagnosis not present

## 2019-07-09 DIAGNOSIS — Z0001 Encounter for general adult medical examination with abnormal findings: Secondary | ICD-10-CM | POA: Diagnosis not present

## 2019-07-09 DIAGNOSIS — I700 Atherosclerosis of aorta: Secondary | ICD-10-CM | POA: Diagnosis not present

## 2019-07-09 DIAGNOSIS — N400 Enlarged prostate without lower urinary tract symptoms: Secondary | ICD-10-CM | POA: Diagnosis not present

## 2019-07-09 DIAGNOSIS — H900 Conductive hearing loss, bilateral: Secondary | ICD-10-CM | POA: Diagnosis not present

## 2019-07-09 DIAGNOSIS — I10 Essential (primary) hypertension: Secondary | ICD-10-CM

## 2019-07-09 DIAGNOSIS — E663 Overweight: Secondary | ICD-10-CM | POA: Diagnosis not present

## 2019-07-09 LAB — LIPID PANEL
Cholesterol: 131 mg/dL (ref <200)
HDL: 42 mg/dL (ref >=40)
LDL Cholesterol (Calc): 70 mg/dL (calc)
Non-HDL Cholesterol (Calc): 89 mg/dL (calc) (ref <130)
Total CHOL/HDL Ratio: 3.1 (calc) (ref <5.0)
Triglycerides: 104 mg/dL (ref <150)

## 2019-07-09 LAB — CBC WITH DIFFERENTIAL/PLATELET
Absolute Monocytes: 562 cells/uL (ref 200–950)
Basophils Absolute: 62 cells/uL (ref 0–200)
Basophils Relative: 0.8 %
Eosinophils Absolute: 117 cells/uL (ref 15–500)
Eosinophils Relative: 1.5 %
HCT: 43.3 % (ref 38.5–50.0)
Hemoglobin: 14.6 g/dL (ref 13.2–17.1)
Lymphs Abs: 3947 cells/uL — ABNORMAL HIGH (ref 850–3900)
MCH: 31.6 pg (ref 27.0–33.0)
MCHC: 33.7 g/dL (ref 32.0–36.0)
MCV: 93.7 fL (ref 80.0–100.0)
MPV: 11.1 fL (ref 7.5–12.5)
Monocytes Relative: 7.2 %
Neutro Abs: 3112 cells/uL (ref 1500–7800)
Neutrophils Relative %: 39.9 %
Platelets: 202 10*3/uL (ref 140–400)
RBC: 4.62 10*6/uL (ref 4.20–5.80)
RDW: 12.4 % (ref 11.0–15.0)
Total Lymphocyte: 50.6 %
WBC: 7.8 10*3/uL (ref 3.8–10.8)

## 2019-07-09 LAB — COMPLETE METABOLIC PANEL WITH GFR
AG Ratio: 2 (calc) (ref 1.0–2.5)
ALT: 23 U/L (ref 9–46)
AST: 24 U/L (ref 10–35)
Albumin: 4.3 g/dL (ref 3.6–5.1)
Alkaline phosphatase (APISO): 87 U/L (ref 35–144)
BUN: 13 mg/dL (ref 7–25)
CO2: 30 mmol/L (ref 20–32)
Calcium: 9.7 mg/dL (ref 8.6–10.3)
Chloride: 106 mmol/L (ref 98–110)
Creat: 0.87 mg/dL (ref 0.70–1.11)
GFR, Est African American: 94 mL/min/{1.73_m2} (ref >=60)
GFR, Est Non African American: 81 mL/min/{1.73_m2} (ref >=60)
Globulin: 2.2 g/dL (calc) (ref 1.9–3.7)
Glucose, Bld: 95 mg/dL (ref 65–99)
Potassium: 4.4 mmol/L (ref 3.5–5.3)
Sodium: 141 mmol/L (ref 135–146)
Total Bilirubin: 0.4 mg/dL (ref 0.2–1.2)
Total Protein: 6.5 g/dL (ref 6.1–8.1)

## 2019-07-09 LAB — TSH: TSH: 1.54 mIU/L (ref 0.40–4.50)

## 2019-07-09 MED ORDER — TRAZODONE HCL 50 MG PO TABS: ORAL_TABLET | ORAL | 0 refills | Status: DC

## 2019-07-09 NOTE — Patient Instructions (Addendum)
Raymond Patrick , Thank you for taking time to come for your Medicare Wellness Visit. I appreciate your ongoing commitment to your health goals. Please review the following plan we discussed and let me know if I can assist you in the future.   These are the goals we discussed: Goals    . LDL CALC < 100       This is a list of the screening recommended for you and due dates:  Health Maintenance  Topic Date Due  . Flu Shot  09/07/2019  . Tetanus Vaccine  03/09/2020  . Urine Protein Check  04/06/2020  . Colon Cancer Screening  12/10/2021  . COVID-19 Vaccine  Completed  . Pneumonia vaccines  Completed    Try trazodone 1/2 - 2 tabs 1 hour prior to ideal bedtime, also helps with mood if taken consistently, but can be used as needed for sleep.     Insomnia Insomnia is frequent trouble falling and/or staying asleep. Insomnia can be a long term problem or a short term problem. Both are common. Insomnia can be a short term problem when the wakefulness is related to a certain stress or worry. Long term insomnia is often related to ongoing stress during waking hours and/or poor sleeping habits. Overtime, sleep deprivation itself can make the problem worse. Every little thing feels more severe because you are overtired and your ability to cope is decreased. CAUSES   Stress, anxiety, and depression.  Poor sleeping habits.  Distractions such as TV in the bedroom.  Naps close to bedtime.  Engaging in emotionally charged conversations before bed.  Technical reading before sleep.  Alcohol and other sedatives. They may make the problem worse. They can hurt normal sleep patterns and normal dream activity.  Stimulants such as caffeine for several hours prior to bedtime.  Pain syndromes and shortness of breath can cause insomnia.  Exercise late at night.  Changing time zones may cause sleeping problems (jet lag). It is sometimes helpful to have someone observe your sleeping patterns. They  should look for periods of not breathing during the night (sleep apnea). They should also look to see how long those periods last. If you live alone or observers are uncertain, you can also be observed at a sleep clinic where your sleep patterns will be professionally monitored. Sleep apnea requires a checkup and treatment. Give your caregivers your medical history. Give your caregivers observations your family has made about your sleep.  SYMPTOMS   Not feeling rested in the morning.  Anxiety and restlessness at bedtime.  Difficulty falling and staying asleep. TREATMENT   Your caregiver may prescribe treatment for an underlying medical disorders. Your caregiver can give advice or help if you are using alcohol or other drugs for self-medication. Treatment of underlying problems will usually eliminate insomnia problems.  Medications can be prescribed for short time use. They are generally not recommended for lengthy use.  Over-the-counter sleep medicines are not recommended for lengthy use. They can be habit forming.  You can promote easier sleeping by making lifestyle changes such as:  Using relaxation techniques that help with breathing and reduce muscle tension.  Exercising earlier in the day.  Changing your diet and the time of your last meal. No night time snacks.  Establish a regular time to go to bed.  Counseling can help with stressful problems and worry.  Soothing music and white noise may be helpful if there are background noises you cannot remove.  Stop tedious detailed work at  least one hour before bedtime. HOME CARE INSTRUCTIONS   Keep a diary. Inform your caregiver about your progress. This includes any medication side effects. See your caregiver regularly. Take note of:  Times when you are asleep.  Times when you are awake during the night.  The quality of your sleep.  How you feel the next day. This information will help your caregiver care for you.  Get out  of bed if you are still awake after 15 minutes. Read or do some quiet activity. Keep the lights down. Wait until you feel sleepy and go back to bed.  Keep regular sleeping and waking hours. Avoid naps.  Exercise regularly.  Avoid distractions at bedtime. Distractions include watching television or engaging in any intense or detailed activity like attempting to balance the household checkbook.  Develop a bedtime ritual. Keep a familiar routine of bathing, brushing your teeth, climbing into bed at the same time each night, listening to soothing music. Routines increase the success of falling to sleep faster.  Use relaxation techniques. This can be using breathing and muscle tension release routines. It can also include visualizing peaceful scenes. You can also help control troubling or intruding thoughts by keeping your mind occupied with boring or repetitive thoughts like the old concept of counting sheep. You can make it more creative like imagining planting one beautiful flower after another in your backyard garden.  During your day, work to eliminate stress. When this is not possible use some of the previous suggestions to help reduce the anxiety that accompanies stressful situations. MAKE SURE YOU:   Understand these instructions.  Will watch your condition.  Will get help right away if you are not doing well or get worse. Document Released: 01/21/2000 Document Revised: 04/17/2011 Document Reviewed: 02/20/2007 [facility] Patient Information 2015 [place], [place]. This information is not intended to replace advice given to you by your health care provider. Make sure you discuss any questions you have with your health care provider.   Trazodone tablets What is this medicine? TRAZODONE (TRAZ oh done) is used to treat depression. This medicine may be used for other purposes; ask your health care provider or pharmacist if you have questions. COMMON BRAND NAME(S): Desyrel What should I tell my  health care provider before I take this medicine? They need to know if you have any of these conditions:  attempted suicide or thinking about it  bipolar disorder  bleeding problems  glaucoma  heart disease, or previous heart attack  irregular heart beat  kidney or liver disease  low levels of sodium in the blood  an unusual or allergic reaction to trazodone, other medicines, foods, dyes or preservatives  pregnant or trying to get pregnant  breast-feeding How should I use this medicine? Take this medicine by mouth with a glass of water. Follow the directions on the prescription label. Take this medicine shortly after a meal or a light snack. Take your medicine at regular intervals. Do not take your medicine more often than directed. Do not stop taking this medicine suddenly except upon the advice of your doctor. Stopping this medicine too quickly may cause serious side effects or your condition may worsen. A special MedGuide will be given to you by the pharmacist with each prescription and refill. Be sure to read this information carefully each time. Talk to your pediatrician regarding the use of this medicine in children. Special care may be needed. Overdosage: If you think you have taken too much of this medicine contact  a poison control center or emergency room at once. NOTE: This medicine is only for you. Do not share this medicine with others. What if I miss a dose? If you miss a dose, take it as soon as you can. If it is almost time for your next dose, take only that dose. Do not take double or extra doses. What may interact with this medicine? Do not take this medicine with any of the following medications:  certain medicines for fungal infections like fluconazole, itraconazole, ketoconazole, posaconazole, voriconazole  cisapride  dronedarone  linezolid  MAOIs like Carbex, Eldepryl, Marplan, Nardil, and Parnate  mesoridazine  methylene blue (injected into a  vein)  pimozide  saquinavir  thioridazine This medicine may also interact with the following medications:  alcohol  antiviral medicines for HIV or AIDS  aspirin and aspirin-like medicines  barbiturates like phenobarbital  certain medicines for blood pressure, heart disease, irregular heart beat  certain medicines for depression, anxiety, or psychotic disturbances  certain medicines for migraine headache like almotriptan, eletriptan, frovatriptan, naratriptan, rizatriptan, sumatriptan, zolmitriptan  certain medicines for seizures like carbamazepine and phenytoin  certain medicines for sleep  certain medicines that treat or prevent blood clots like dalteparin, enoxaparin, warfarin  digoxin  fentanyl  lithium  NSAIDS, medicines for pain and inflammation, like ibuprofen or naproxen  other medicines that prolong the QT interval (cause an abnormal heart rhythm) like dofetilide  rasagiline  supplements like St. John's wort, kava kava, valerian  tramadol  tryptophan This list may not describe all possible interactions. Give your health care provider a list of all the medicines, herbs, non-prescription drugs, or dietary supplements you use. Also tell them if you smoke, drink alcohol, or use illegal drugs. Some items may interact with your medicine. What should I watch for while using this medicine? Tell your doctor if your symptoms do not get better or if they get worse. Visit your doctor or health care professional for regular checks on your progress. Because it may take several weeks to see the full effects of this medicine, it is important to continue your treatment as prescribed by your doctor. Patients and their families should watch out for new or worsening thoughts of suicide or depression. Also watch out for sudden changes in feelings such as feeling anxious, agitated, panicky, irritable, hostile, aggressive, impulsive, severely restless, overly excited and  hyperactive, or not being able to sleep. If this happens, especially at the beginning of treatment or after a change in dose, call your health care professional. Dennis Bast may get drowsy or dizzy. Do not drive, use machinery, or do anything that needs mental alertness until you know how this medicine affects you. Do not stand or sit up quickly, especially if you are an older patient. This reduces the risk of dizzy or fainting spells. Alcohol may interfere with the effect of this medicine. Avoid alcoholic drinks. This medicine may cause dry eyes and blurred vision. If you wear contact lenses you may feel some discomfort. Lubricating drops may help. See your eye doctor if the problem does not go away or is severe. Your mouth may get dry. Chewing sugarless gum, sucking hard candy and drinking plenty of water may help. Contact your doctor if the problem does not go away or is severe. What side effects may I notice from receiving this medicine? Side effects that you should report to your doctor or health care professional as soon as possible:  allergic reactions like skin rash, itching or hives, swelling of the  face, lips, or tongue  elevated mood, decreased need for sleep, racing thoughts, impulsive behavior  confusion  fast, irregular heartbeat  feeling faint or lightheaded, falls  feeling agitated, angry, or irritable  loss of balance or coordination  painful or prolonged erections  restlessness, pacing, inability to keep still  suicidal thoughts or other mood changes  tremors  trouble sleeping  seizures  unusual bleeding or bruising Side effects that usually do not require medical attention (report to your doctor or health care professional if they continue or are bothersome):  change in sex drive or performance  change in appetite or weight  constipation  headache  muscle aches or pains  nausea This list may not describe all possible side effects. Call your doctor for medical  advice about side effects. You may report side effects to FDA at 1-800-FDA-1088. Where should I keep my medicine? Keep out of the reach of children. Store at room temperature between 15 and 30 degrees C (59 to 86 degrees F). Protect from light. Keep container tightly closed. Throw away any unused medicine after the expiration date. NOTE: This sheet is a summary. It may not cover all possible information. If you have questions about this medicine, talk to your doctor, pharmacist, or health care provider.  2020 Elsevier/Gold Standard (2018-01-15 11:46:46)

## 2019-08-09 ENCOUNTER — Other Ambulatory Visit: Payer: Self-pay | Admitting: Adult Health

## 2019-08-19 ENCOUNTER — Encounter (INDEPENDENT_AMBULATORY_CARE_PROVIDER_SITE_OTHER): Payer: PPO | Admitting: Ophthalmology

## 2019-08-19 ENCOUNTER — Other Ambulatory Visit: Payer: Self-pay

## 2019-08-19 DIAGNOSIS — I10 Essential (primary) hypertension: Secondary | ICD-10-CM

## 2019-08-19 DIAGNOSIS — H35033 Hypertensive retinopathy, bilateral: Secondary | ICD-10-CM | POA: Diagnosis not present

## 2019-08-19 DIAGNOSIS — H34812 Central retinal vein occlusion, left eye, with macular edema: Secondary | ICD-10-CM | POA: Diagnosis not present

## 2019-08-19 DIAGNOSIS — H43813 Vitreous degeneration, bilateral: Secondary | ICD-10-CM | POA: Diagnosis not present

## 2019-10-13 ENCOUNTER — Encounter: Payer: Self-pay | Admitting: Internal Medicine

## 2019-10-13 NOTE — Patient Instructions (Signed)

## 2019-10-13 NOTE — Progress Notes (Signed)
History of Present Illness:       This very nice 84 y.o. single WM presents for 6 month follow up with HTN, HLD, Pre-Diabetes and Vitamin D Deficiency.  Patient had the Pfizer Covid vaccines x 2 in Feb 2021.        In 2008, patient underwent a RULobectomy by VATS for Ca. In 2013, patient was treated for Prostate & Bladder cancers. In Nov 2020, patient had a Colonoscopy & was recc  no further screening Colonoscopies.        Patient is treated for HTN  (1998) & BP has been controlled at home. Today's BP  Is at goal - 122/68.  In 2018, patient had a negative Stress Myoview. Patient has Aortic atherosclerosis by CT scan in 2020.   Patient has had no complaints of any cardiac type chest pain, palpitations, dyspnea / orthopnea / PND, dizziness, claudication, or dependent edema.       Hyperlipidemia is controlled with diet & Rosuvastatin. Patient denies myalgias or other med SE's. Last Lipids were at goal:  Lab Results  Component Value Date   CHOL 131 07/09/2019   HDL 42 07/09/2019   LDLCALC 70 07/09/2019   TRIG 104 07/09/2019   CHOLHDL 3.1 07/09/2019    Also, the patient has history of PreDiabetes (A1c 6.2% /2011)  and has had no symptoms of reactive hypoglycemia, diabetic polys, paresthesias or visual blurring.  Last A1c was Normal & at goal:  Lab Results  Component Value Date   HGBA1C 5.6 04/07/2019           Further, the patient also has history of Vitamin D Deficiency  ("30" /2008)  and supplements vitamin D without any suspected side-effects. Last vitamin D was at goal:   Lab Results  Component Value Date   VD25OH 86 04/07/2019    Current Outpatient Medications on File Prior to Visit  Medication Sig  . aspirin 81 MG tablet Take 81 mg by mouth daily.  . cetirizine (ZYRTEC) 10 MG tablet Take 1 tablet (10 mg total) by mouth daily. One tab daily for allergies  . Cholecalciferol (VITAMIN D) 2000 UNITS CAPS Take 8,000 Units by mouth daily.   Marland Kitchen glucose blood (FREESTYLE  TEST STRIPS) test strip Check blood sugar 1 time daily-DX-R73.03.  . glucose monitoring kit (FREESTYLE) monitoring kit Check blood sugar 1 time daily-DX-R73.03.  Marland Kitchen Lancets (FREESTYLE) lancets Check blood sugar 1 time daily-DX-R73.03.  . Multiple Vitamin (MULTIVITAMIN WITH MINERALS) TABS Take 1 tablet by mouth every evening.   . Omega-3 Fatty Acids (FISH OIL) 1000 MG CAPS Take 1 capsule by mouth every evening.   . rosuvastatin (CRESTOR) 10 MG tablet TAKE 1 TABLET BY MOUTH EVERY OTHER DAY IN THE EVENING  . traZODone (DESYREL) 50 MG tablet 1/2-2 tablet 1 hour prior to bedtime for mood and sleep  . vitamin C (ASCORBIC ACID) 500 MG tablet Take 500 mg by mouth daily.   No current facility-administered medications on file prior to visit.    Allergies  Allergen Reactions  . Nitrofuran Derivatives Shortness Of Breath    macrobid    PMHx:   Past Medical History:  Diagnosis Date  . Allergy    seasonal  . Bladder tumor   . Cancer ([place])    bladder,, lung  . COPD (chronic obstructive pulmonary disease) ([place])   . Diverticulosis of sigmoid colon    2016  . History of kidney stones   . History of lung cancer 2008  S/P RIGHT UPPER LOPECTOMY    STAGE I  NON-SMALL CELL LUNG CANCER--  ONCOLOGIST- DR MOHAMED-   NO RECURRENCE  . Hyperlipidemia   . Hypertension   . Impaired hearing bilateral aids  . Irregular heart rate    per pt heart will skip a beat   . Kidney stone 07/08/2019  . Unspecified vitamin D deficiency     Immunization History  Administered Date(s) Administered  . Influenza, High Dose Seasonal PF 11/03/2014, 11/02/2016, 11/28/2017, 11/30/2018  . Influenza,inj,Quad PF,6+ Mos 11/09/2015  . Influenza-Unspecified 11/06/2012  . PFIZER SARS-COV-2 Vaccination 02/27/2019, 03/19/2019  . Pneumococcal Conjugate-13 01/20/2014  . Pneumococcal-Unspecified 02/06/2002, 09/23/2012  . Tdap 03/09/2010  . Zoster 08/03/2014    Past Surgical History:  Procedure Laterality Date  . COLONOSCOPY   05-09-2004   HPP and tics Kaplan   . EXTRACORPOREAL SHOCK WAVE LITHOTRIPSY Left 04/18/2018   Procedure: EXTRACORPOREAL SHOCK WAVE LITHOTRIPSY (ESWL);  Surgeon: Festus Aloe, MD;  Location: [facility];  Service: Urology;  Laterality: Left;  . FIBEROPTIC BRONCHOSCOPY  10-28-2008  . LITHOTRIPSY  2012  . LUNG REMOVAL, PARTIAL  2008  . PROSTATE SURGERY     prostatectomy  . RIGHT UPPER LUNG LOBECTOMY  09-21-2006  DR BURNEY   NON-SMALL CELL CANCER/ SEVERE COPD  . TRANSURETHRAL RESECTION OF BLADDER TUMOR  12/25/2011   Procedure: TRANSURETHRAL RESECTION OF BLADDER TUMOR (TURBT);  Surgeon: Franchot Gallo, MD;  Location: [facility];  Service: Urology;  Laterality: N/A;  . TRANSURETHRAL RESECTION OF PROSTATE  12/25/2011   Procedure: TRANSURETHRAL RESECTION OF THE PROSTATE WITH GYRUS INSTRUMENTS;  Surgeon: Franchot Gallo, MD;  Location: [facility];  Service: Urology;  Laterality: N/A;  2 HRS     FHx:    Reviewed / unchanged  SHx:    Reviewed / unchanged   Systems Review:  Constitutional: Denies fever, chills, wt changes, headaches, insomnia, fatigue, night sweats, change in appetite. Eyes: Denies redness, blurred vision, diplopia, discharge, itchy, watery eyes.  ENT: Denies discharge, congestion, post nasal drip, epistaxis, sore throat, earache,  dental pain, tinnitus, vertigo, sinus pain, snoring.  Has hearing loss - bilat hearing aids. CV: Denies chest pain, palpitations, irregular heartbeat, syncope, dyspnea, diaphoresis, orthopnea, PND, claudication or edema. Respiratory: denies cough, dyspnea, DOE, pleurisy, hoarseness, laryngitis, wheezing.  Gastrointestinal: Denies dysphagia, odynophagia, heartburn, reflux, water brash, abdominal pain or cramps, nausea, vomiting, bloating, diarrhea, constipation, hematemesis, melena, hematochezia  or hemorrhoids. Genitourinary: Denies dysuria, frequency, urgency, nocturia, hesitancy, discharge, hematuria or flank  pain. Musculoskeletal: Denies arthralgias, myalgias, stiffness, jt. swelling, pain, limping or strain/sprain.  Skin: Denies pruritus, rash, hives, warts, acne, eczema or change in skin lesion(s). Neuro: No weakness, tremor, incoordination, spasms, paresthesia or pain. Psychiatric: Denies confusion, memory loss or sensory loss. Endo: Denies change in weight, skin or hair change.  Heme/Lymph: No excessive bleeding, bruising or enlarged lymph nodes.  Physical Exam  BP 122/68   Pulse 75   Temp (!) 97.5 F (36.4 C)   Wt 165 lb (74.8 kg)   SpO2 98%   BMI 27.88 kg/m   Appears  well nourished, well groomed  and in no distress.  Eyes: PERRLA, EOMs, conjunctiva no swelling or erythema. Sinuses: No frontal/maxillary tenderness ENT/Mouth: EAC's clear, TM's nl w/o erythema, bulging. Nares clear w/o erythema, swelling, exudates. Oropharynx clear without erythema or exudates. Oral hygiene is good. Tongue normal, non obstructing. Hearing intact.  Neck: Supple. Thyroid not palpable. Car 2+/2+ without bruits, nodes or JVD. Chest: Respirations nl with BS clear &  equal w/o rales, rhonchi, wheezing or stridor.  Cor: Heart sounds normal w/ regular rate and rhythm without sig. murmurs, gallops, clicks or rubs. Peripheral pulses normal and equal  without edema.  Abdomen: Soft & bowel sounds normal. Non-tender w/o guarding, rebound, hernias, masses or organomegaly.  Lymphatics: Unremarkable.  Musculoskeletal: Full ROM all peripheral extremities, joint stability, 5/5 strength and normal gait.  Skin: Warm, dry without exposed rashes, lesions or ecchymosis apparent.  Neuro: Cranial nerves intact, reflexes equal bilaterally. Sensory-motor testing grossly intact. Tendon reflexes grossly intact.  Pysch: Alert & oriented x 3.  Insight and judgement nl & appropriate. No ideations.  Assessment and Plan:  1. Labile hypertension  - Continue medication, monitor blood pressure at home.  - Continue DASH diet.   Reminder to go to the ER if any CP,  SOB, nausea, dizziness, severe HA, changes vision/speech.  - CBC with Differential/Platelet - COMPLETE METABOLIC PANEL WITH GFR - Magnesium - TSH  2. Hyperlipidemia, mixed  - Continue diet/meds, exercise,& lifestyle modifications.  - Continue monitor periodic cholesterol/liver & renal functions   - Lipid panel - TSH  3. Abnormal glucose  - Continue diet, exercise  - Lifestyle modifications.  - Monitor appropriate labs.  - Hemoglobin A1c - Insulin, random  4. Vitamin D deficiency  - Continue supplementation.  - VITAMIN D 25 Hydroxyl  5. Prediabetes  - Hemoglobin A1c - Insulin, random  6. Chronic obstructive pulmonary disease  ([place])   7. History of lung cancer   8. History of prostate cancer   9. History of bladder cancer   10. Medication management  - CBC with Differential/Platelet - COMPLETE METABOLIC PANEL WITH GFR - Magnesium - Lipid panel - TSH - Hemoglobin A1c - Insulin, random - VITAMIN D 25 Hydroxyl         Discussed  regular exercise, BP monitoring, weight control to achieve/maintain BMI less than 25 and discussed med and SE's. Recommended labs to assess and monitor clinical status with further disposition pending results of labs.  I discussed the assessment and treatment plan with the patient. The patient was provided an opportunity to ask questions and all were answered. The patient agreed with the plan and demonstrated an understanding of the instructions.  I provided over 30 minutes of exam, counseling, chart review and  complex critical decision making.   Kirtland Bouchard, MD

## 2019-10-15 ENCOUNTER — Ambulatory Visit (INDEPENDENT_AMBULATORY_CARE_PROVIDER_SITE_OTHER): Payer: PPO | Admitting: Internal Medicine

## 2019-10-15 ENCOUNTER — Encounter: Payer: Self-pay | Admitting: Internal Medicine

## 2019-10-15 ENCOUNTER — Other Ambulatory Visit: Payer: Self-pay

## 2019-10-15 VITALS — BP 122/68 | HR 75 | Temp 97.5°F | Ht 64.5 in | Wt 165.0 lb

## 2019-10-15 DIAGNOSIS — R7309 Other abnormal glucose: Secondary | ICD-10-CM | POA: Diagnosis not present

## 2019-10-15 DIAGNOSIS — Z85118 Personal history of other malignant neoplasm of bronchus and lung: Secondary | ICD-10-CM

## 2019-10-15 DIAGNOSIS — Z8546 Personal history of malignant neoplasm of prostate: Secondary | ICD-10-CM

## 2019-10-15 DIAGNOSIS — Z79899 Other long term (current) drug therapy: Secondary | ICD-10-CM | POA: Diagnosis not present

## 2019-10-15 DIAGNOSIS — J449 Chronic obstructive pulmonary disease, unspecified: Secondary | ICD-10-CM | POA: Diagnosis not present

## 2019-10-15 DIAGNOSIS — R0989 Other specified symptoms and signs involving the circulatory and respiratory systems: Secondary | ICD-10-CM

## 2019-10-15 DIAGNOSIS — E782 Mixed hyperlipidemia: Secondary | ICD-10-CM | POA: Diagnosis not present

## 2019-10-15 DIAGNOSIS — Z8551 Personal history of malignant neoplasm of bladder: Secondary | ICD-10-CM

## 2019-10-15 DIAGNOSIS — E559 Vitamin D deficiency, unspecified: Secondary | ICD-10-CM | POA: Diagnosis not present

## 2019-10-15 DIAGNOSIS — R7303 Prediabetes: Secondary | ICD-10-CM | POA: Diagnosis not present

## 2019-10-16 LAB — CBC WITH DIFFERENTIAL/PLATELET
Absolute Monocytes: 524 cells/uL (ref 200–950)
Basophils Absolute: 62 cells/uL (ref 0–200)
Basophils Relative: 0.8 %
Eosinophils Absolute: 123 cells/uL (ref 15–500)
Eosinophils Relative: 1.6 %
HCT: 43 % (ref 38.5–50.0)
Hemoglobin: 14.4 g/dL (ref 13.2–17.1)
Lymphs Abs: 4004 cells/uL — ABNORMAL HIGH (ref 850–3900)
MCH: 31.5 pg (ref 27.0–33.0)
MCHC: 33.5 g/dL (ref 32.0–36.0)
MCV: 94.1 fL (ref 80.0–100.0)
MPV: 10.6 fL (ref 7.5–12.5)
Monocytes Relative: 6.8 %
Neutro Abs: 2988 cells/uL (ref 1500–7800)
Neutrophils Relative %: 38.8 %
Platelets: 219 10*3/uL (ref 140–400)
RBC: 4.57 10*6/uL (ref 4.20–5.80)
RDW: 12.3 % (ref 11.0–15.0)
Total Lymphocyte: 52 %
WBC: 7.7 10*3/uL (ref 3.8–10.8)

## 2019-10-16 LAB — TSH: TSH: 1.22 mIU/L (ref 0.40–4.50)

## 2019-10-16 LAB — COMPLETE METABOLIC PANEL WITH GFR
AG Ratio: 1.9 (calc) (ref 1.0–2.5)
ALT: 18 U/L (ref 9–46)
AST: 21 U/L (ref 10–35)
Albumin: 4.3 g/dL (ref 3.6–5.1)
Alkaline phosphatase (APISO): 82 U/L (ref 35–144)
BUN: 12 mg/dL (ref 7–25)
CO2: 28 mmol/L (ref 20–32)
Calcium: 9.6 mg/dL (ref 8.6–10.3)
Chloride: 105 mmol/L (ref 98–110)
Creat: 0.93 mg/dL (ref 0.70–1.11)
GFR, Est African American: 89 mL/min/{1.73_m2} (ref >=60)
GFR, Est Non African American: 77 mL/min/{1.73_m2} (ref >=60)
Globulin: 2.3 g/dL (calc) (ref 1.9–3.7)
Glucose, Bld: 88 mg/dL (ref 65–99)
Potassium: 4.5 mmol/L (ref 3.5–5.3)
Sodium: 140 mmol/L (ref 135–146)
Total Bilirubin: 0.8 mg/dL (ref 0.2–1.2)
Total Protein: 6.6 g/dL (ref 6.1–8.1)

## 2019-10-16 LAB — MAGNESIUM: Magnesium: 2.1 mg/dL (ref 1.5–2.5)

## 2019-10-16 LAB — HEMOGLOBIN A1C
Hgb A1c MFr Bld: 5.5 % of total Hgb (ref <5.7)
Mean Plasma Glucose: 111 (calc)
eAG (mmol/L): 6.2 (calc)

## 2019-10-16 LAB — LIPID PANEL
Cholesterol: 149 mg/dL (ref <200)
HDL: 40 mg/dL (ref >=40)
LDL Cholesterol (Calc): 88 mg/dL (calc)
Non-HDL Cholesterol (Calc): 109 mg/dL (calc) (ref <130)
Total CHOL/HDL Ratio: 3.7 (calc) (ref <5.0)
Triglycerides: 114 mg/dL (ref <150)

## 2019-10-16 LAB — INSULIN, RANDOM: Insulin: 5 u[IU]/mL

## 2019-10-16 LAB — VITAMIN D 25 HYDROXY (VIT D DEFICIENCY, FRACTURES): Vit D, 25-Hydroxy: 75 ng/mL (ref 30–100)

## 2019-10-16 NOTE — Progress Notes (Signed)
========================================================== -   Test results slightly outside the reference range are not unusual. If there is anything important, I will review this with you,  otherwise it is considered normal test values.  If you have further questions,  please do not hesitate to contact me at the office or via My Chart.  ==========================================================  -  Total Chol = 149  and LDL Chol = 88 - Both  Excellent   - Very low risk for Heart Attack  / Stroke =============================================================  - A1c - Normal - Great - No Diabetes ==========================================================  -  Vitamin D =75 - excellent   ==========================================================  -  All Else - CBC - Kidneys - Electrolytes - Liver - Magnesium & Thyroid    - all  Normal / OK ==========================================================   - Keep up the [place] Work  !  ==========================================================

## 2019-10-21 ENCOUNTER — Encounter (INDEPENDENT_AMBULATORY_CARE_PROVIDER_SITE_OTHER): Payer: PPO | Admitting: Ophthalmology

## 2019-10-21 ENCOUNTER — Other Ambulatory Visit: Payer: Self-pay

## 2019-10-21 DIAGNOSIS — H35033 Hypertensive retinopathy, bilateral: Secondary | ICD-10-CM

## 2019-10-21 DIAGNOSIS — I10 Essential (primary) hypertension: Secondary | ICD-10-CM

## 2019-10-21 DIAGNOSIS — H43813 Vitreous degeneration, bilateral: Secondary | ICD-10-CM

## 2019-10-21 DIAGNOSIS — H34812 Central retinal vein occlusion, left eye, with macular edema: Secondary | ICD-10-CM

## 2019-11-05 DIAGNOSIS — H2513 Age-related nuclear cataract, bilateral: Secondary | ICD-10-CM | POA: Diagnosis not present

## 2019-11-05 DIAGNOSIS — H524 Presbyopia: Secondary | ICD-10-CM | POA: Diagnosis not present

## 2019-11-05 DIAGNOSIS — H52203 Unspecified astigmatism, bilateral: Secondary | ICD-10-CM | POA: Diagnosis not present

## 2019-11-05 DIAGNOSIS — H349 Unspecified retinal vascular occlusion: Secondary | ICD-10-CM | POA: Diagnosis not present

## 2019-11-07 ENCOUNTER — Encounter: Payer: Self-pay | Admitting: Cardiovascular Disease

## 2019-11-07 ENCOUNTER — Ambulatory Visit (INDEPENDENT_AMBULATORY_CARE_PROVIDER_SITE_OTHER): Payer: PPO | Admitting: Cardiovascular Disease

## 2019-11-07 ENCOUNTER — Other Ambulatory Visit: Payer: Self-pay

## 2019-11-07 DIAGNOSIS — E782 Mixed hyperlipidemia: Secondary | ICD-10-CM

## 2019-11-07 DIAGNOSIS — I10 Essential (primary) hypertension: Secondary | ICD-10-CM | POA: Diagnosis not present

## 2019-11-07 NOTE — Patient Instructions (Signed)
Medication Instructions:  Your physician recommends that you continue on your current medications as directed. Please refer to the Current Medication list given to you today.  *If you need a refill on your cardiac medications before your next appointment, please call your pharmacy*   Lab Work: None If you have labs (blood work) drawn today and your tests are completely normal, you will receive your results only by: Marland Kitchen MyChart Message (if you have MyChart) OR . A paper copy in the mail If you have any lab test that is abnormal or we need to change your treatment, we will call you to review the results.   Testing/Procedures: None   Follow-Up: At [facility], you and your health needs are our priority.  As part of our continuing mission to provide you with exceptional heart care, we have created designated Provider Care Teams.  These Care Teams include your primary Cardiologist (physician) and Advanced Practice Providers (APPs -  Physician Assistants and Nurse Practitioners) who all work together to provide you with the care you need, when you need it.  We recommend signing up for the patient portal called "MyChart".  Sign up information is provided on this After Visit Summary.  MyChart is used to connect with patients for Virtual Visits (Telemedicine).  Patients are able to view lab/test results, encounter notes, upcoming appointments, etc.  Non-urgent messages can be sent to your provider as well.   To learn more about what you can do with MyChart, go to [email or web address].    Your next appointment:   1 year(s)  The format for your next appointment:   In Person  Provider:   Quay Burow, MD   Other Instructions

## 2019-11-07 NOTE — Assessment & Plan Note (Signed)
History of hyperlipidemia on statin therapy with lipid profile performed 10/15/2019 revealing a total cholesterol 149, LDL of 88 and HDL 40.

## 2019-11-07 NOTE — Assessment & Plan Note (Signed)
History of essential hypertension blood pressure measured today 112/72.  He is not on antihypertensive medications.

## 2019-11-07 NOTE — Progress Notes (Signed)
11/07/2019 Raymond Patrick   1935-05-02  [id number]  Primary Physician Raymond Pinto, MD Primary Cardiologist: Raymond Harp MD Raymond Patrick, [place], [place]  HPI:  Raymond Patrick is a 84 y.o.  mildly overweight single Caucasian male with no children referred by Dr. Melford Aase for cardiovascular evaluation.I last saw him in the office  11/06/2018.He lives alone. He is retired. 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 His cardiovascular risk factor profile from October 4 50 pack years of tobacco abuse having quit 10 years ago. He has treated hypertension and hyperlipidemia. There is no family history of heart disease. He is never have a heart attack or stroke. Does get mild dyspnea. His past medical history otherwise is remarkable for having undergone a VATS procedure by Dr. Baldemar Patrick for what turned out to be malignancy. Dr. Earlie Patrick is his oncologist. He has had no recurrence.  Did have a negative Myoview stress test performed 11/09/2015 for atypical chest pain.  Since I saw him a year ago he continues to do well.  He denies chest pain or shortness of breath.  Current Meds  Medication Sig  . aspirin 81 MG tablet Take 81 mg by mouth daily.  . cetirizine (ZYRTEC) 10 MG tablet Take 1 tablet (10 mg total) by mouth daily. One tab daily for allergies  . Cholecalciferol (VITAMIN D) 2000 UNITS CAPS Take 8,000 Units by mouth daily.   Marland Kitchen glucose blood (FREESTYLE TEST STRIPS) test strip Check blood sugar 1 time daily-DX-R73.03.  . glucose monitoring kit (FREESTYLE) monitoring kit Check blood sugar 1 time daily-DX-R73.03.  Marland Kitchen Lancets (FREESTYLE) lancets Check blood sugar 1 time daily-DX-R73.03.  . Multiple Vitamin (MULTIVITAMIN WITH MINERALS) TABS Take 1 tablet by mouth every evening.   . Omega-3 Fatty Acids (FISH OIL) 1000 MG CAPS Take 1 capsule by mouth every evening.   . rosuvastatin (CRESTOR) 10 MG tablet TAKE 1 TABLET BY MOUTH EVERY OTHER DAY IN THE EVENING  . traZODone  (DESYREL) 50 MG tablet 1/2-2 tablet 1 hour prior to bedtime for mood and sleep  . vitamin C (ASCORBIC ACID) 500 MG tablet Take 500 mg by mouth daily.     Allergies  Allergen Reactions  . Nitrofuran Derivatives Shortness Of Breath    macrobid    Social History   Socioeconomic History  . Marital status: Single    Spouse name: Not on file  . Number of children: 0  . Years of education: Not on file  . Highest education level: Not on file  Occupational History  . Occupation: retired  Tobacco Use  . Smoking status: Former Smoker    Packs/day: 1.00    Years: 35.00    Pack years: 35.00    Types: Cigarettes    Quit date: 02/07/2004    Years since quitting: 15.7  . Smokeless tobacco: Never Used  Substance and Sexual Activity  . Alcohol use: Yes    Alcohol/week: 0.0 standard drinks    Comment: socially  . Drug use: No  . Sexual activity: Not on file  Other Topics Concern  . Not on file  Social History Narrative  . Not on file   Social Determinants of Health   Financial Resource Strain:   . Difficulty of Paying Living Expenses: Not on file  Food Insecurity:   . Worried About Charity fundraiser in the Last Year: Not on file  . Ran Out of Food in the Last Year: Not on file  Transportation Needs:   . Film/video editor (Medical): Not on file  . Lack of Transportation (Non-Medical): Not on file  Physical Activity:   . Days of Exercise per Week: Not on file  . Minutes of Exercise per Session: Not on file  Stress:   . Feeling of Stress : Not on file  Social Connections:   . Frequency of Communication with Friends and Family: Not on file  . Frequency of Social Gatherings with Friends and Family: Not on file  . Attends Religious Services: Not on file  . Active Member of Clubs or Organizations: Not on file  . Attends Archivist Meetings: Not on file  . Marital Status: Not on file  Intimate Partner Violence:   . Fear of Current or Ex-Partner: Not on file  .  Emotionally Abused: Not on file  . Physically Abused: Not on file  . Sexually Abused: Not on file     Review of Systems: General: negative for chills, fever, night sweats or weight changes.  Cardiovascular: negative for chest pain, dyspnea on exertion, edema, orthopnea, palpitations, paroxysmal nocturnal dyspnea or shortness of breath Dermatological: negative for rash Respiratory: negative for cough or wheezing Urologic: negative for hematuria Abdominal: negative for nausea, vomiting, diarrhea, bright red blood per rectum, melena, or hematemesis Neurologic: negative for visual changes, syncope, or dizziness All other systems reviewed and are otherwise negative except as noted above.    Blood pressure 112/72, pulse 76, height _0  (1.6 m), weight 165 lb 9.6 oz (75.1 kg), SpO2 97 %.  General appearance: alert and no distress Neck: no adenopathy, no carotid bruit, no JVD, supple, symmetrical, trachea midline and thyroid not enlarged, symmetric, no tenderness/mass/nodules Lungs: clear to auscultation bilaterally Heart: regular rate and rhythm, S1, S2 normal, no murmur, click, rub or gallop Extremities: extremities normal, atraumatic, no cyanosis or edema Pulses: 2+ and symmetric Skin: Skin color, texture, turgor normal. No rashes or lesions Neurologic: Alert and oriented X 3, normal strength and tone. Normal symmetric reflexes. Normal coordination and gait  EKG not performed today  ASSESSMENT AND PLAN:   Hyperlipidemia History of hyperlipidemia on statin therapy with lipid profile performed 10/15/2019 revealing a total cholesterol 149, LDL of 88 and HDL 40.  Hypertension History of essential hypertension blood pressure measured today 112/72.  He is not on antihypertensive medications.      Raymond Harp MD FACP,FACC,FAHA, [facility] 11/07/2019 12:12 PM

## 2019-12-01 DIAGNOSIS — R310 Gross hematuria: Secondary | ICD-10-CM | POA: Diagnosis not present

## 2019-12-01 DIAGNOSIS — Z8551 Personal history of malignant neoplasm of bladder: Secondary | ICD-10-CM | POA: Diagnosis not present

## 2019-12-01 DIAGNOSIS — N209 Urinary calculus, unspecified: Secondary | ICD-10-CM | POA: Diagnosis not present

## 2019-12-01 DIAGNOSIS — R311 Benign essential microscopic hematuria: Secondary | ICD-10-CM | POA: Diagnosis not present

## 2019-12-23 ENCOUNTER — Encounter (INDEPENDENT_AMBULATORY_CARE_PROVIDER_SITE_OTHER): Payer: PPO | Admitting: Ophthalmology

## 2019-12-23 ENCOUNTER — Other Ambulatory Visit: Payer: Self-pay

## 2019-12-23 DIAGNOSIS — H34812 Central retinal vein occlusion, left eye, with macular edema: Secondary | ICD-10-CM | POA: Diagnosis not present

## 2019-12-23 DIAGNOSIS — I10 Essential (primary) hypertension: Secondary | ICD-10-CM | POA: Diagnosis not present

## 2019-12-23 DIAGNOSIS — H35033 Hypertensive retinopathy, bilateral: Secondary | ICD-10-CM

## 2019-12-23 DIAGNOSIS — H43813 Vitreous degeneration, bilateral: Secondary | ICD-10-CM

## 2020-01-07 DIAGNOSIS — N200 Calculus of kidney: Secondary | ICD-10-CM | POA: Diagnosis not present

## 2020-01-07 DIAGNOSIS — C675 Malignant neoplasm of bladder neck: Secondary | ICD-10-CM | POA: Diagnosis not present

## 2020-01-07 DIAGNOSIS — Z8551 Personal history of malignant neoplasm of bladder: Secondary | ICD-10-CM | POA: Diagnosis not present

## 2020-01-07 DIAGNOSIS — R310 Gross hematuria: Secondary | ICD-10-CM | POA: Diagnosis not present

## 2020-01-08 ENCOUNTER — Ambulatory Visit: Payer: PPO | Admitting: Adult Health

## 2020-01-09 ENCOUNTER — Ambulatory Visit: Payer: PPO | Attending: Internal Medicine

## 2020-01-09 DIAGNOSIS — Z23 Encounter for immunization: Secondary | ICD-10-CM

## 2020-01-09 NOTE — Progress Notes (Signed)
   Covid-19 Vaccination Clinic  Name:  BADER STUBBLEFIELD    MRN: [id number] DOB: 26-May-1935  01/09/2020  Mr. Viner was observed post Covid-19 immunization for 15 minutes without incident. He was provided with Vaccine Information Sheet and instruction to access the V-Safe system.   Mr. Kritikos was instructed to call 911 with any severe reactions post vaccine: Marland Kitchen Difficulty breathing  . Swelling of face and throat  . A fast heartbeat  . A bad rash all over body  . Dizziness and weakness   Immunizations Administered    Name Date Dose VIS Date Route   Pfizer COVID-19 Vaccine 01/09/2020  1:45 PM 0.3 mL 11/26/2019 Intramuscular   Manufacturer: [place]   Lot: [id number]   NDC: [place]

## 2020-01-13 ENCOUNTER — Ambulatory Visit (INDEPENDENT_AMBULATORY_CARE_PROVIDER_SITE_OTHER): Payer: PPO | Admitting: Adult Health Nurse Practitioner

## 2020-01-13 ENCOUNTER — Encounter: Payer: Self-pay | Admitting: Adult Health Nurse Practitioner

## 2020-01-13 ENCOUNTER — Other Ambulatory Visit: Payer: Self-pay

## 2020-01-13 VITALS — BP 128/74 | HR 71 | Temp 96.9°F | Ht 63.0 in | Wt 168.0 lb

## 2020-01-13 DIAGNOSIS — E559 Vitamin D deficiency, unspecified: Secondary | ICD-10-CM

## 2020-01-13 DIAGNOSIS — E663 Overweight: Secondary | ICD-10-CM | POA: Diagnosis not present

## 2020-01-13 DIAGNOSIS — I10 Essential (primary) hypertension: Secondary | ICD-10-CM | POA: Diagnosis not present

## 2020-01-13 DIAGNOSIS — E782 Mixed hyperlipidemia: Secondary | ICD-10-CM

## 2020-01-13 DIAGNOSIS — R7309 Other abnormal glucose: Secondary | ICD-10-CM | POA: Diagnosis not present

## 2020-01-13 DIAGNOSIS — I700 Atherosclerosis of aorta: Secondary | ICD-10-CM | POA: Diagnosis not present

## 2020-01-13 DIAGNOSIS — Z85118 Personal history of other malignant neoplasm of bronchus and lung: Secondary | ICD-10-CM

## 2020-01-13 DIAGNOSIS — H900 Conductive hearing loss, bilateral: Secondary | ICD-10-CM | POA: Diagnosis not present

## 2020-01-13 DIAGNOSIS — Z87891 Personal history of nicotine dependence: Secondary | ICD-10-CM | POA: Diagnosis not present

## 2020-01-13 DIAGNOSIS — Z8551 Personal history of malignant neoplasm of bladder: Secondary | ICD-10-CM

## 2020-01-13 DIAGNOSIS — Z79899 Other long term (current) drug therapy: Secondary | ICD-10-CM | POA: Diagnosis not present

## 2020-01-13 DIAGNOSIS — Z8546 Personal history of malignant neoplasm of prostate: Secondary | ICD-10-CM | POA: Diagnosis not present

## 2020-01-13 DIAGNOSIS — G4700 Insomnia, unspecified: Secondary | ICD-10-CM

## 2020-01-13 NOTE — Progress Notes (Signed)
FOLLOW UP 3 MONTH  Assessment / Plan   Diagnoses and all orders for this visit:  Essential hypertension Continue medications Monitor blood pressure at home; call if consistently over 130/80 Continue DASH diet.   Reminder to go to the ER if any CP, SOB, nausea, dizziness, severe HA, changes vision/speech, left arm numbness and tingling and jaw pain.  Hyperlipidemia Continue rosuvastatin 49m nightly Continue low cholesterol diet and exercise.  Check lipid panel.   Aortic atherosclerosis ([place] 03/2018 Control blood pressure, cholesterol, glucose, increase exercise.   Vitamin D deficiency At goal at recent check; continue to recommend supplementation for goal of 60-100 Defer vitamin D level  Other abnormal glucose Recent A1Cs at goal Discussed diet/exercise, weight management  Defer A1C; check CMP  Overweight (BMI 25.0-29.9) Long discussion about weight loss, diet, and exercise Recommended diet heavy in fruits and veggies and low in animal meats, cheeses, and dairy products, appropriate calorie intake Discussed appropriate weight for height  Follow up at next visit  Medication management CBC, CMP/GFR  Hearing loss, bilateral Continue hearing aids   Insomnia Insomnia- good sleep hygiene discussed, increase day time activity, try going to sleep at night at consistent time Will try trazodone for mood and insomnia; take 25-100 mg 1 hour prior to bedtime   History of smoking Has recent CXR;   History of lung cancer S/p VATS RUL lobectomy, 10 years without recurrence; Released by Dr. MEarlie ServerFollowing annual CXR, UTD  Hx of bladder/prostatin cancer denies urinary symptoms, followed by Dr. DBeatrix Fetters due for follow up, call to schedule   Further disposition pending results if labs check today. Discussed med's effects and SE's.   Over 30 minutes of face to face interview, exam, counseling, chart review, and critical decision making was performed.    Future  Appointments  Date Time Provider [place] 02/24/2020  7:30 AM MHayden Pedro MD [facility] None  04/15/2020 11:00 AM MUnk Pinto MD [facility] None      Subjective:  Raymond Patrick a 84y.o. male who presents for  3 month follow up for HTN, HLD, glucose management, and vitamin D Def.   Patient reports overall he is doing well today.  No health or medication concerns  Patient is s/p RUL lobectomy by VATS  for Primary Lung Cancer in 2008 followed by Dr MJulien Nordmann(has since been released, doing annual CXR, last in March 2021) and also is s/p TURP for Prostate Cancer & Bladder cancer in 2013 by Dr DDiona Fanti continues to follow up with him doing annual cysto, recently negative in 03/2018. Also follows with hx of kidney stones, had lithotripsy last year without problems since.   Last week he had an appointment for hematuria with urology, scope preformed that was unremarkable.  Reports he is now waiting to see an MRI.  Since then no further hematuria.    Reports some difficulty sleeping in the last year, has been having trouble falling asleep, down mood since wasn't getting out to see people with the pandemic, has been up to 2 am but will still wake up at 7 am. Working on cutting back on the news. Enjoying being more social, trying to get out more.  He reports he goes to the gym and walks everey day.  Reports his appetite is good and he is not beenlosing any weight.  He aims to drink about 60 ozunces of water a day.   BMI is Body mass index is 29.76 kg/m., he has been  working on diet and exercise. He is very active and aims to get 10000 steps daily, walks 1-2 hours 3 days a week as well.  Wt Readings from Last 3 Encounters:  01/13/20 168 lb (76.2 kg)  11/07/19 165 lb 9.6 oz (75.1 kg)  10/15/19 165 lb (74.8 kg)   He has abdominal aortic atherosclerosis per CT 03/2018.  His blood pressure has been controlled at home, today their BP is BP: 128/74 He does workout. He denies chest  pain, shortness of breath, dizziness.   He is on cholesterol medication (rosuvastatin 10 mg every other day, he has been on pravastatin and lipitor in the past) and denies myalgias. His cholesterol is at goal. The cholesterol last visit was:   Lab Results  Component Value Date   CHOL 149 10/15/2019   HDL 40 10/15/2019   LDLCALC 88 10/15/2019   TRIG 114 10/15/2019   CHOLHDL 3.7 10/15/2019   He has been working on diet and exercise for glucose management, and denies foot ulcerations, increased appetite, nausea, paresthesia of the feet, polydipsia, polyuria, visual disturbances and vomiting. Last A1C in the office was:  Lab Results  Component Value Date   HGBA1C 5.5 10/15/2019    Last GFR:  Lab Results  Component Value Date   GFRNONAA 77 10/15/2019    Patient is on Vitamin D supplement and at goal at recent check:   Lab Results  Component Value Date   VD25OH 75 10/15/2019      Medication Review: Current Outpatient Medications on File Prior to Visit  Medication Sig Dispense Refill   aspirin 81 MG tablet Take 81 mg by mouth daily.     cetirizine (ZYRTEC) 10 MG tablet Take 1 tablet (10 mg total) by mouth daily. One tab daily for allergies 30 tablet 3   Cholecalciferol (VITAMIN D) 2000 UNITS CAPS Take 8,000 Units by mouth daily.      glucose blood (FREESTYLE TEST STRIPS) test strip Check blood sugar 1 time daily-DX-R73.03. 100 each 1   glucose monitoring kit (FREESTYLE) monitoring kit Check blood sugar 1 time daily-DX-R73.03. 1 each 0   Lancets (FREESTYLE) lancets Check blood sugar 1 time daily-DX-R73.03. 100 each 1   Multiple Vitamin (MULTIVITAMIN WITH MINERALS) TABS Take 1 tablet by mouth every evening.      Omega-3 Fatty Acids (FISH OIL) 1000 MG CAPS Take 1 capsule by mouth every evening.      rosuvastatin (CRESTOR) 10 MG tablet TAKE 1 TABLET BY MOUTH EVERY OTHER DAY IN THE EVENING 90 tablet 1   traZODone (DESYREL) 50 MG tablet 1/2-2 tablet 1 hour prior to bedtime for  mood and sleep 30 tablet 0   vitamin C (ASCORBIC ACID) 500 MG tablet Take 500 mg by mouth daily.     No current facility-administered medications on file prior to visit.    Allergies: Allergies  Allergen Reactions   Nitrofuran Derivatives Shortness Of Breath    macrobid    Current Problems (verified) has Hyperlipidemia; History of smoking; Hypertension; Vitamin D deficiency; Other abnormal glucose; History of lung cancer; Overweight (BMI 25.0-29.9); Encounter for Medicare annual wellness exam; BPH (benign prostatic hyperplasia); Aortic atherosclerosis ([place]) by CT Scan; Bilateral hearing loss; and Insomnia on their problem list.  Screening Tests Health Maintenance  Topic Date Due   INFLUENZA VACCINE  09/07/2019   TETANUS/TDAP  03/09/2020   COLONOSCOPY  12/10/2021   COVID-19 Vaccine  Completed   PNA vac Low Risk Adult  Completed   Preventative care: Last colonoscopy: 12/2018,  polyps, DONE per GI due to age   Prior vaccinations: TD or Tdap: 2012  Influenza: 11/2019  Pneumococcal: 2004, 2014 Prevnar13: 2015 Shingles/Zostavax: 2016 Covid 19: 2/2, 2021, pfizer Booster 01/09/20   Names of Other Physician/Practitioners you currently use: 1. [facility] here for primary care 2. Dr. Matthews/Gould, eye doctor, last visit 2020, mild Left cataract, has has L macular edema followed by Dr. Zigmund Daniel q7-8 weeks, doing injections 3. [employer], Pharmacist, community, last visit 2021  Patient Care Team: [name], MD as PCP - General (Internal Medicine) Franchot Gallo, MD as Consulting Physician (Urology) Curt Bears, MD as Consulting Physician (Oncology) Lorretta Harp, MD as Consulting Physician (Cardiology) Inda Castle, MD (Inactive) as Consulting Physician (Gastroenterology) Melissa Noon, [place] as Referring Physician (Optometry)  Surgical: He  has a past surgical history that includes Lung removal, partial (2008);  Lithotripsy (2012); Fiberoptic bronchoscopy (10-28-2008); RIGHT UPPER LUNG LOBECTOMY (09-21-2006  DR Arlyce Dice); Transurethral resection of prostate (12/25/2011); Transurethral resection of bladder tumor (12/25/2011); Prostate surgery; Colonoscopy (05-09-2004); and Extracorporeal shock wave lithotripsy (Left, 04/18/2018). Family His family history includes Cancer in his father and mother; Cancer (age of onset: 66) in his brother; Cancer (age of onset: 50) in his brother; Colon cancer in his father; Colon polyps in his mother; Diabetes in his maternal grandmother and mother; Heart failure in his maternal grandmother; Hypertension in his mother; Pneumonia in his maternal grandfather. Social history  He reports that he quit smoking about 15 years ago. His smoking use included cigarettes. He has a 35.00 pack-year smoking history. He has never used smokeless tobacco. He reports current alcohol use. He reports that he does not use drugs.    Objective:   Today's Vitals   01/13/20 1549  BP: 128/74  Pulse: 71  Temp: (!) 96.9 F (36.1 C)  SpO2: 98%  Weight: 168 lb (76.2 kg)  Height: '5\' 3"'  (1.6 m)   Body mass index is 29.76 kg/m.  General appearance: alert, no distress, WD/WN, male HEENT: normocephalic, sclerae anicteric, Bil ear canels clear, hearing aids, nares patent, no discharge or erythema, pharynx normal Oral cavity: MMM, no lesions Neck: supple, no lymphadenopathy, no thyromegaly, no masses Heart: RRR, normal S1, S2, no murmurs Lungs: CTA bilaterally, no wheezes, rhonchi, or rales Abdomen: +bs, soft, non tender, non distended, no masses, no hepatomegaly, no splenomegaly Musculoskeletal: nontender, no swelling, no obvious deformity Extremities: no edema, no cyanosis, no clubbing Pulses: 2+ symmetric, upper and lower extremities, normal cap refill Neurological: alert, oriented x 3, CN2-12 intact, strength normal upper extremities and lower extremities, sensation normal throughout, DTRs 2+  throughout, no cerebellar signs, gait normal Psychiatric: normal affect, behavior normal, pleasant     Garnet Sierras, Laqueta Jean, DNP [facility] 01/13/2020  4:28 PM

## 2020-01-14 LAB — COMPLETE METABOLIC PANEL WITH GFR
AG Ratio: 2 (calc) (ref 1.0–2.5)
ALT: 17 U/L (ref 9–46)
AST: 23 U/L (ref 10–35)
Albumin: 4.2 g/dL (ref 3.6–5.1)
Alkaline phosphatase (APISO): 82 U/L (ref 35–144)
BUN: 12 mg/dL (ref 7–25)
CO2: 29 mmol/L (ref 20–32)
Calcium: 9.3 mg/dL (ref 8.6–10.3)
Chloride: 106 mmol/L (ref 98–110)
Creat: 0.95 mg/dL (ref 0.70–1.11)
GFR, Est African American: 86 mL/min/{1.73_m2} (ref >=60)
GFR, Est Non African American: 74 mL/min/{1.73_m2} (ref >=60)
Globulin: 2.1 g/dL (calc) (ref 1.9–3.7)
Glucose, Bld: 88 mg/dL (ref 65–99)
Potassium: 4.5 mmol/L (ref 3.5–5.3)
Sodium: 142 mmol/L (ref 135–146)
Total Bilirubin: 0.4 mg/dL (ref 0.2–1.2)
Total Protein: 6.3 g/dL (ref 6.1–8.1)

## 2020-01-14 LAB — CBC WITH DIFFERENTIAL/PLATELET
Absolute Monocytes: 655 cells/uL (ref 200–950)
Basophils Absolute: 77 cells/uL (ref 0–200)
Basophils Relative: 1 %
Eosinophils Absolute: 293 cells/uL (ref 15–500)
Eosinophils Relative: 3.8 %
HCT: 42 % (ref 38.5–50.0)
Hemoglobin: 14.4 g/dL (ref 13.2–17.1)
Lymphs Abs: 3912 cells/uL — ABNORMAL HIGH (ref 850–3900)
MCH: 31.6 pg (ref 27.0–33.0)
MCHC: 34.3 g/dL (ref 32.0–36.0)
MCV: 92.3 fL (ref 80.0–100.0)
MPV: 10.7 fL (ref 7.5–12.5)
Monocytes Relative: 8.5 %
Neutro Abs: 2764 cells/uL (ref 1500–7800)
Neutrophils Relative %: 35.9 %
Platelets: 214 10*3/uL (ref 140–400)
RBC: 4.55 10*6/uL (ref 4.20–5.80)
RDW: 12.2 % (ref 11.0–15.0)
Total Lymphocyte: 50.8 %
WBC: 7.7 10*3/uL (ref 3.8–10.8)

## 2020-01-14 LAB — LIPID PANEL
Cholesterol: 140 mg/dL (ref <200)
HDL: 39 mg/dL — ABNORMAL LOW (ref >=40)
LDL Cholesterol (Calc): 80 mg/dL (calc)
Non-HDL Cholesterol (Calc): 101 mg/dL (calc) (ref <130)
Total CHOL/HDL Ratio: 3.6 (calc) (ref <5.0)
Triglycerides: 111 mg/dL (ref <150)

## 2020-01-15 ENCOUNTER — Ambulatory Visit: Payer: PPO | Admitting: Adult Health Nurse Practitioner

## 2020-02-03 DIAGNOSIS — M5134 Other intervertebral disc degeneration, thoracic region: Secondary | ICD-10-CM | POA: Diagnosis not present

## 2020-02-03 DIAGNOSIS — K429 Umbilical hernia without obstruction or gangrene: Secondary | ICD-10-CM | POA: Diagnosis not present

## 2020-02-03 DIAGNOSIS — N200 Calculus of kidney: Secondary | ICD-10-CM | POA: Diagnosis not present

## 2020-02-03 DIAGNOSIS — R310 Gross hematuria: Secondary | ICD-10-CM | POA: Diagnosis not present

## 2020-02-24 ENCOUNTER — Encounter (INDEPENDENT_AMBULATORY_CARE_PROVIDER_SITE_OTHER): Payer: PPO | Admitting: Ophthalmology

## 2020-02-26 ENCOUNTER — Other Ambulatory Visit: Payer: Self-pay

## 2020-02-26 ENCOUNTER — Encounter (INDEPENDENT_AMBULATORY_CARE_PROVIDER_SITE_OTHER): Payer: PPO | Admitting: Ophthalmology

## 2020-02-26 DIAGNOSIS — H34812 Central retinal vein occlusion, left eye, with macular edema: Secondary | ICD-10-CM | POA: Diagnosis not present

## 2020-02-26 DIAGNOSIS — I10 Essential (primary) hypertension: Secondary | ICD-10-CM | POA: Diagnosis not present

## 2020-02-26 DIAGNOSIS — H43813 Vitreous degeneration, bilateral: Secondary | ICD-10-CM | POA: Diagnosis not present

## 2020-02-26 DIAGNOSIS — H35033 Hypertensive retinopathy, bilateral: Secondary | ICD-10-CM

## 2020-02-27 ENCOUNTER — Encounter (INDEPENDENT_AMBULATORY_CARE_PROVIDER_SITE_OTHER): Payer: PPO | Admitting: Ophthalmology

## 2020-03-29 ENCOUNTER — Other Ambulatory Visit: Payer: Self-pay | Admitting: Internal Medicine

## 2020-03-29 MED ORDER — GABAPENTIN 600 MG PO TABS: ORAL_TABLET | ORAL | 0 refills | Status: DC

## 2020-04-05 ENCOUNTER — Telehealth: Payer: Self-pay | Admitting: *Deleted

## 2020-04-05 NOTE — Telephone Encounter (Signed)
Returned a call regarding left sciatica pain and Gabapentin dose. Patient takes 300 mg in the morning, 600 mg in the middle of the day and 300 mg in the evening, without relief. Patient has appointment to be evaluated on 04/07/2020, with Garnet Sierras, NP.

## 2020-04-05 NOTE — Telephone Encounter (Signed)
Per Dr Melford Aase, patient advised to increase Gabapentin 600 mg to 1 in the morning, 1/2 or 1 tablet in the middle of the day and 1 tablet in the evening.

## 2020-04-07 ENCOUNTER — Other Ambulatory Visit: Payer: Self-pay

## 2020-04-07 ENCOUNTER — Encounter: Payer: Self-pay | Admitting: Adult Health Nurse Practitioner

## 2020-04-07 ENCOUNTER — Ambulatory Visit (INDEPENDENT_AMBULATORY_CARE_PROVIDER_SITE_OTHER): Payer: PPO | Admitting: Adult Health Nurse Practitioner

## 2020-04-07 VITALS — BP 122/78 | HR 76 | Temp 97.6°F | Wt 164.4 lb

## 2020-04-07 DIAGNOSIS — M5432 Sciatica, left side: Secondary | ICD-10-CM | POA: Diagnosis not present

## 2020-04-07 MED ORDER — DEXAMETHASONE 1 MG PO TABS: ORAL_TABLET | ORAL | 0 refills | Status: DC

## 2020-04-07 NOTE — Progress Notes (Signed)
Assessment and Plan:  There are no diagnoses linked to this encounter.    Further disposition pending results of labs. Discussed med's effects and SE's.   Over 30 minutes of exam, counseling, chart review, and critical decision making was performed.   Future Appointments  Date Time Provider [place]  04/15/2020 11:00 AM [name], MD [facility] None  04/27/2020  7:30 AM Hayden Pedro, MD [facility] None    ------------------------------------------------------------------------------------------------------------------   HPI 85 y.o.male presents for evaluation of left sided back pain.  Started three weeks ago, he was at church, sat for one hour.  When he stood up he could barely make it to the car.  Pain was at 10/10.  He describes this as burning / crawling pain. It radiates down his leg into his calf.  It is intermittent but once satrted hard to get this to calm down.  He did not sleep well for three days after that.  It is worse with walking, resting does improve the pain.  Currently 10/10 after walking but improved since sitting in the exam room.  He is now using a walker for safety as he is concerned the pain could make him fall.  He has been taking 1,056m three times a day.  He tried a heating pad that helped some.  He had gabapentin 6018mtab, he is taking one tablet in the morning, half tablet in afternoon and one tablet at bedtime.  Reports some improvement with severity of his symptoms, not resolved.  Report he did have hip pain a few years ago but does not recall any treatments at that time. He has not had any back/hip imaging in the past or any ortho consultations.    Past Medical History:  Diagnosis Date  . Allergy    seasonal  . Bladder tumor   . Cancer ([place]   bladder,, lung  . COPD (ch[place] obstructive pulmonary disease) [place]  . Diverticulosis of sigmoid colon    2016  . History of kidney stones   . History of lung cancer 2008  S/P RIGHT  UPPER LOPECTOMY    STAGE I  NON-SMALL CELL LUNG CANCER--  ONCOLOGIST- DR MOHAMED-   NO RECURRENCE  . Hyperlipidemia   . Hypertension   . Impaired hearing bilateral aids  . Irregular heart rate    per pt heart will skip a beat   . Kidney stone 07/08/2019  . Unspecified vitamin D deficiency      Allergies  Allergen Reactions  . Nitrofuran Derivatives Shortness Of Breath    macrobid    Current Outpatient Medications on File Prior to Visit  Medication Sig  . aspirin 81 MG tablet Take 81 mg by mouth daily.  . cetirizine (ZYRTEC) 10 MG tablet Take 1 tablet (10 mg total) by mouth daily. One tab daily for allergies  . Cholecalciferol (VITAMIN D) 2000 UNITS CAPS Take 8,000 Units by mouth daily.   . Marland Kitchenabapentin (NEURONTIN) 600 MG tablet Take  1/2 to 1 tablet  3 x /day  as needed for Sciatica Pain  . glucose blood (FREESTYLE TEST STRIPS) test strip Check blood sugar 1 time daily-DX-R73.03.  . glucose monitoring kit (FREESTYLE) monitoring kit Check blood sugar 1 time daily-DX-R73.03.  . Marland Kitchenancets (FREESTYLE) lancets Check blood sugar 1 time daily-DX-R73.03.  . Multiple Vitamin (MULTIVITAMIN WITH MINERALS) TABS Take 1 tablet by mouth every evening.   . Omega-3 Fatty Acids (FISH OIL) 1000 MG CAPS Take 1 capsule by mouth every evening.   .Marland Kitchen  rosuvastatin (CRESTOR) 10 MG tablet TAKE 1 TABLET BY MOUTH EVERY OTHER DAY IN THE EVENING  . traZODone (DESYREL) 50 MG tablet 1/2-2 tablet 1 hour prior to bedtime for mood and sleep  . vitamin C (ASCORBIC ACID) 500 MG tablet Take 500 mg by mouth daily.   No current facility-administered medications on file prior to visit.    ROS: all negative except above.   Physical Exam:  BP 122/78   Pulse 76   Temp 97.6 F (36.4 C)   Wt 164 lb 6.4 oz (74.6 kg)   SpO2 96%   BMI 29.12 kg/m   General Appearance: Well nourished, in no apparent distress. Eyes: PERRLA, EOMs, conjunctiva no swelling or erythema Sinuses: No Frontal/maxillary tenderness ENT/Mouth: Ext aud  canals clear, TMs without erythema, bulging. No erythema, swelling, or exudate on post pharynx.  Tonsils not swollen or erythematous. Hearing normal.  Neck: Supple, thyroid normal.  Respiratory: Respiratory effort normal, BS equal bilaterally without rales, rhonchi, wheezing or stridor.  Cardio: RRR with no MRGs. Brisk peripheral pulses without edema.  Abdomen: Soft, + BS.  Non tender, no guarding, rebound, hernias, masses. Lymphatics: Non tender without lymphadenopathy.  Musculoskeletal: Full ROM, 5/5 strength, normal gait. Ambulating with walker.  Point tenderness to left gluteal, S1? Skin: Warm, dry without rashes, lesions, ecchymosis.  Neuro: Cranial nerves intact. Normal muscle tone, no cerebellar symptoms. Sensation intact.  Psych: Awake and oriented X 3, normal affect, Insight and Judgment appropriate.      Garnet Sierras, Laqueta Jean, DNP [facility] 04/07/2020  2:23 PM

## 2020-04-07 NOTE — Patient Instructions (Signed)
We sent in a dexamethasone taper for you.  Be sure to this medication with food to avoid any stomach upset.  Continue taking the Gabapentin to help with the nerve pain.    If you do not have improvement please let us know, we can do a referral to orthopedics for imaging.   Sciatica  Sciatica is pain, weakness, tingling, or loss of feeling (numbness) along the sciatic nerve. The sciatic nerve starts in the lower back and goes down the back of each leg. Sciatica usually goes away on its own or with treatment. Sometimes, sciatica may come back (recur). What are the causes? This condition happens when the sciatic nerve is pinched or has pressure put on it. This may be the result of:  A disk in between the bones of the spine bulging out too far (herniated disk).  Changes in the spinal disks that occur with aging.  A condition that affects a muscle in the butt.  Extra bone growth near the sciatic nerve.  A break (fracture) of the area between your hip bones (pelvis).  Pregnancy.  Tumor. This is rare. What increases the risk? You are more likely to develop this condition if you:  Play sports that put pressure or stress on the spine.  Have poor strength and ease of movement (flexibility).  Have had a back injury in the past.  Have had back surgery.  Sit for long periods of time.  Do activities that involve bending or lifting over and over again.  Are very overweight (obese). What are the signs or symptoms? Symptoms can vary from mild to very bad. They may include:  Any of these problems in the lower back, leg, hip, or butt: ? Mild tingling, loss of feeling, or dull aches. ? Burning sensations. ? Sharp pains.  Loss of feeling in the back of the calf or the sole of the foot.  Leg weakness.  Very bad back pain that makes it hard to move. These symptoms may get worse when you cough, sneeze, or laugh. They may also get worse when you sit or stand for long periods of  time. How is this treated? This condition often gets better without any treatment. However, treatment may include:  Changing or cutting back on physical activity when you have pain.  Doing exercises and stretching.  Putting ice or heat on the affected area.  Medicines that help: ? To relieve pain and swelling. ? To relax your muscles.  Shots (injections) of medicines that help to relieve pain, irritation, and swelling.  Surgery. Follow these instructions at home: Medicines  Take over-the-counter and prescription medicines only as told by your doctor.  Ask your doctor if the medicine prescribed to you: ? Requires you to avoid driving or using heavy machinery. ? Can cause trouble pooping (constipation). You may need to take these steps to prevent or treat trouble pooping:  Drink enough fluids to keep your pee (urine) pale yellow.  Take over-the-counter or prescription medicines.  Eat foods that are high in fiber. These include beans, whole grains, and fresh fruits and vegetables.  Limit foods that are high in fat and sugar. These include fried or sweet foods. Managing pain  If told, put ice on the affected area. ? Put ice in a plastic bag. ? Place a towel between your skin and the bag. ? Leave the ice on for 20 minutes, 2-3 times a day.  If told, put heat on the affected area. Use the heat source  that your doctor tells you to use, such as a moist heat pack or a heating pad. ? Place a towel between your skin and the heat source. ? Leave the heat on for 20-30 minutes. ? Remove the heat if your skin turns bright red. This is very important if you are unable to feel pain, heat, or cold. You may have a greater risk of getting burned.      Activity  Return to your normal activities as told by your doctor. Ask your doctor what activities are safe for you.  Avoid activities that make your symptoms worse.  Take short rests during the day. ? When you rest for a long time, do  some physical activity or stretching between periods of rest. ? Avoid sitting for a long time without moving. Get up and move around at least one time each hour.  Exercise and stretch regularly, as told by your doctor.  Do not lift anything that is heavier than 10 lb (4.5 kg) while you have symptoms of sciatica. ? Avoid lifting heavy things even when you do not have symptoms. ? Avoid lifting heavy things over and over.  When you lift objects, always lift in a way that is safe for your body. To do this, you should: ? Bend your knees. ? Keep the object close to your body. ? Avoid twisting.   General instructions  Stay at a healthy weight.  Wear comfortable shoes that support your feet. Avoid wearing high heels.  Avoid sleeping on a mattress that is too soft or too hard. You might have less pain if you sleep on a mattress that is firm enough to support your back.  Keep all follow-up visits as told by your doctor. This is important. Contact a doctor if:  You have pain that: ? Wakes you up when you are sleeping. ? Gets worse when you lie down. ? Is worse than the pain you have had in the past. ? Lasts longer than 4 weeks.  You lose weight without trying. Get help right away if:  You cannot control when you pee (urinate) or poop (have a bowel movement).  You have weakness in any of these areas and it gets worse: ? Lower back. ? The area between your hip bones. ? Butt. ? Legs.  You have redness or swelling of your back.  You have a burning feeling when you pee. Summary  Sciatica is pain, weakness, tingling, or loss of feeling (numbness) along the sciatic nerve.  This condition happens when the sciatic nerve is pinched or has pressure put on it.  Sciatica can cause pain, tingling, or loss of feeling (numbness) in the lower back, legs, hips, and butt.  Treatment often includes rest, exercise, medicines, and putting ice or heat on the affected area. This information is not  intended to replace advice given to you by your health care provider. Make sure you discuss any questions you have with your health care provider. Document Revised: 02/11/2018 Document Reviewed: 02/11/2018 Elsevier Patient Education  [place].   .

## 2020-04-14 ENCOUNTER — Encounter: Payer: Self-pay | Admitting: Internal Medicine

## 2020-04-14 NOTE — Patient Instructions (Signed)

## 2020-04-14 NOTE — Progress Notes (Signed)
Annual  Screening/Preventative Visit  & Comprehensive Evaluation & Examination      This very nice 85 y.o. single WM presents for a Screening /Preventative Visit & comprehensive evaluation and management of multiple medical co-morbidities.  Patient has been followed for HTN, HLD, Prediabetes and Vitamin D Deficiency.   Patient has COPD as confirmed by CXR's & Chest CT scans.          In 2008,  patient had a  RUL Lung cancer resected by VATS and in 2013, he was treated for Prostate and Bladder cancers.      HTN predates scirca 1998. Patient's BP has been controlled at home.  Today's BP is at goal - 130/78. In 2018, he had a negative /Normal Stress Myoview.  Abd CT scan on 03/18/2018 revealed Aortic Atherosclerosis. Patient denies any cardiac symptoms as chest pain, palpitations, shortness of breath, dizziness or ankle swelling.      Patient's hyperlipidemia is controlled with diet/Rosuvastatin. Patient denies myalgias or other medication SE's. Last lipids were at goal:  Lab Results  Component Value Date   CHOL 140 01/13/2020   HDL 39 (L) 01/13/2020   LDLCALC 80 01/13/2020   TRIG 111 01/13/2020   CHOLHDL 3.6 01/13/2020       Patient has hx/o prediabetes (A1c 6.2% /2011) and patient denies reactive hypoglycemic symptoms, visual blurring, diabetic polys or paresthesias. Last A1c was normal & at goal:   Lab Results  Component Value Date   HGBA1C 5.5 10/15/2019        Finally, patient has history of Vitamin D Deficiency ("30" /2008) and last vitamin D was at goal:  Lab Results  Component Value Date   VD25OH 64 10/15/2019    Current Outpatient Medications on File Prior to Visit  Medication Sig  . aspirin 81 MG tablet Take daily.  . cetirizine  10 MG tablet Take 1 tablet  daily  . VITAMIN D 2000 u Take 8,000 Units daily.   Marland Kitchen gabapentin  600 MG tablet Take  1/2 to 1 tablet  3 x /day  as needed f  . Multi-Vit w/Minerals Take 1 tablet  every evening.   . Omega-3 FISH OIL 1000  mg Take 1 capsule  every evening.   . rosuvastatin  10 MG tablet TAKE 1 TABLET EVERY OTHER DAY   . traZODone  50 MG tablet 1/2-2 tablet 1 hour prior to bedtime  . vitamin C  500 MG tablet Take  daily.    Allergies  Allergen Reactions  . Nitrofuran Derivatives  (macrobid) Shortness Of Breath    Past Medical History:  Diagnosis Date  . Allergy    seasonal  . Bladder tumor   . Cancer ([place])    bladder,, lung  . COPD (chronic obstructive pulmonary disease) ([place])   . Diverticulosis of sigmoid colon    2016  . History of kidney stones   . History of lung cancer 2008  S/P RIGHT UPPER LOPECTOMY    STAGE I  NON-SMALL CELL LUNG CANCER--  ONCOLOGIST- DR MOHAMED-   NO RECURRENCE  . Hyperlipidemia   . Hypertension   . Impaired hearing bilateral aids  . Irregular heart rate    per pt heart will skip a beat   . Kidney stone 07/08/2019  . Unspecified vitamin D deficiency     Health Maintenance  Topic Date Due  . INFLUENZA VACCINE  09/07/2019  . TETANUS/TDAP  03/09/2020  . COLONOSCOPY  12/10/2021  . COVID-19 Vaccine  Completed  .  PNA vac Low Risk Adult  Completed  . HPV VACCINES  Aged Out    Immunization History  Administered Date(s) Administered  . Influenza, High Dose Seasonal PF 11/28/2017, 11/30/2018  . Influenza,inj,Quad PF,6+ Mos 11/09/2015  . Influenza 11/06/2012  . PFIZERSARS-COV-2 Vacc  03/19/2019, 01/09/2020  . Pneumococcal Conjugate-13 01/20/2014  . Pneumococcal-Unspecified 02/06/2002, 09/23/2012  . Tdap 03/09/2010  . Zoster 08/03/2014    Last Colon - 11.04.2020 - Dr Havery Moros - Recc no f/u due to age  Past Surgical History:  Procedure Laterality Date  . COLONOSCOPY  05-09-2004   HPP and tics [place]   . EXTRACORPOREAL SHOCK WAVE LITHOTRIPSY Left 04/18/2018   Procedure: EXTRACORPOREAL SHOCK WAVE LITHOTRIPSY (ESWL);  Surgeon: Festus Aloe, MD;  Location: [facility];  Service: Urology;  Laterality: Left;  . FIBEROPTIC BRONCHOSCOPY  10-28-2008  . LITHOTRIPSY  2012   . LUNG REMOVAL, PARTIAL  2008  . PROSTATE SURGERY     prostatectomy  . RIGHT UPPER LUNG LOBECTOMY  09-21-2006  DR BURNEY   NON-SMALL CELL CANCER/ SEVERE COPD  . TRANSURETHRAL RESECTION OF BLADDER TUMOR  12/25/2011   Procedure: TRANSURETHRAL RESECTION OF BLADDER TUMOR (TURBT);  Surgeon: Franchot Gallo, MD;  Location: [facility];  Service: Urology;  Laterality: N/A;  . TRANSURETHRAL RESECTION OF PROSTATE  12/25/2011   Procedure: TRANSURETHRAL RESECTION OF THE PROSTATE WITH GYRUS INSTRUMENTS;  Surgeon: Franchot Gallo, MD;  Location[facility];  Service: Urology;  Laterality: N/A;  2 HRS     Family History  Problem Relation Age of Onset  . Cancer Mother        Non-Hodgkin's Lymphoma  . Diabetes Mother   . Hypertension Mother   . Colon polyps Mother   . Cancer Father        Colon cancer w/ metastasis to lung & bone  . Colon cancer Father   . Cancer Brother 66       Lung cancer  . Diabetes Maternal Grandmother   . Heart failure Maternal Grandmother   . Cancer Brother 65       Non-small cell Lung cancer  . Pneumonia Maternal Grandfather   . Esophageal cancer Neg Hx   . Rectal cancer Neg Hx   . Stomach cancer Neg Hx     Social History   Socioeconomic History  . Marital status: Single  Occupational History  . Occupation: retired  Tobacco Use  . Smoking status: Former Smoker    Packs/day: 1.00    Years: 35.00    Pack years: 35.00    Types: Cigarettes    Quit date: 02/07/2004    Years since quitting: 16.1  . Smokeless tobacco: Never Used  Substance and Sexual Activity  . Alcohol use: Yes    Alcohol/week: 0.0 standard drinks    Comment: socially  . Drug use: No  . Sexual activity: Not on file     ROS Constitutional: Denies fever, chills, weight loss/gain, headaches, insomnia,  night sweats or change in appetite. Does c/o fatigue. Eyes: Denies redness, blurred vision, diplopia, discharge, itchy or watery eyes.  ENT: Denies  discharge, congestion, post nasal drip, epistaxis, sore throat, earache, hearing loss, dental pain, Tinnitus, Vertigo, Sinus pain or snoring.  Cardio: Denies chest pain, palpitations, irregular heartbeat, syncope, dyspnea, diaphoresis, orthopnea, PND, claudication or edema Respiratory: denies cough, dyspnea, DOE, pleurisy, hoarseness, laryngitis or wheezing.  Gastrointestinal: Denies dysphagia, heartburn, reflux, water brash, pain, cramps, nausea, vomiting, bloating, diarrhea, constipation, hematemesis, melena, hematochezia, jaundice or hemorrhoids Genitourinary: Denies dysuria,  frequency, urgency, nocturia, hesitancy, discharge, hematuria or flank pain Musculoskeletal: Denies arthralgia, myalgia, stiffness, Jt. Swelling, pain, limp or strain/sprain. Denies Falls. Skin: Denies puritis, rash, hives, warts, acne, eczema or change in skin lesion Neuro: No weakness, tremor, incoordination, spasms, paresthesia or pain Psychiatric: Denies confusion, memory loss or sensory loss. Denies Depression. Endocrine: Denies change in weight, skin, hair change, nocturia, and paresthesia, diabetic polys, visual blurring or hyper / hypo glycemic episodes.  Heme/Lymph: No excessive bleeding, bruising or enlarged lymph nodes.  Physical Exam  BP 130/78   Pulse 64   Temp (!) 97.3 F (36.3 C)   Resp 16   Ht 5\' 4"  (1.626 m)   Wt 166 lb 9.6 oz (75.6 kg)   SpO2 97%   BMI 28.60 kg/m   General Appearance: Well nourished and well groomed and in no apparent distress.  Eyes: PERRLA, EOMs, conjunctiva no swelling or erythema, normal fundi and vessels. Sinuses: No frontal/maxillary tenderness ENT/Mouth: EACs patent / TMs  nl. Nares clear without erythema, swelling, mucoid exudates. Oral hygiene is good. No erythema, swelling, or exudate. Tongue normal, non-obstructing. Tonsils not swollen or erythematous. Hearing normal.  Neck: Supple, thyroid not palpable. No bruits, nodes or JVD. Respiratory: Respiratory effort  normal.  BS equal and clear bilateral without rales, rhonci, wheezing or stridor. Cardio: Heart sounds are normal with regular rate and rhythm and no murmurs, rubs or gallops. Peripheral pulses are normal and equal bilaterally without edema. No aortic or femoral bruits. Chest: symmetric with normal excursions and percussion.  Abdomen: Soft, with Nl bowel sounds. Nontender, no guarding, rebound, hernias, masses, or organomegaly.  Lymphatics: Non tender without lymphadenopathy.  Musculoskeletal: Full ROM all peripheral extremities, joint stability, 5/5 strength, and normal gait. Skin: Warm and dry without rashes, lesions, cyanosis, clubbing or  ecchymosis.  Neuro: Cranial nerves intact, reflexes equal bilaterally. Normal muscle tone, no cerebellar symptoms. Sensation intact.  Pysch: Alert and oriented x 3 with normal affect, insight and judgment appropriate.   Assessment and Plan  1. Annual Preventative/Screening Exam    2. Essential hypertension  - EKG 12-Lead - [place], RETROPERITNL ABD,  LTD - Urinalysis, Routine w reflex microscopic - Microalbumin / creatinine urine ratio - CBC with Differential/Platelet - COMPLETE METABOLIC PANEL WITH GFR - Magnesium  3. Hyperlipidemia, mixed  - EKG 12-Lead - [place], RETROPERITNL ABD,  LTD - Lipid panel - TSH  4. Abnormal glucose  - EKG 12-Lead - [place], RETROPERITNL ABD,  LTD - Hemoglobin A1c - Insulin, random  5. Vitamin D deficiency  - VITAMIN D 25 Hydroxyl 6. Aortic atherosclerosis ([place]) by Abd  CTscan on 03/18/2018  - EKG 12-Lead - [place], RETROPERITNL ABD,  LTD - Lipid panel  7. Chronic obstructive pulmonary disease, ([place])   8. BPH with obstruction/lower urinary tract symptoms  - PSA  9. Screening for colorectal cancer  - POC Hemoccult Bld/Stl  10. Prostate cancer screening  - PSA  11. Screening for ischemic heart disease  - EKG 12-Lead  12. FHx: heart disease  - EKG 12-Lead - [place], RETROPERITNL ABD,  LTD  13. Former  smoker  - EKG 12-Lead - [place], RETROPERITNL ABD,  LTD  14. Screening for AAA (aortic abdominal aneurysm)  - [place], RETROPERITNL ABD,  LTD  15. Medication management  - CBC with Differential/Platelet - COMPLETE METABOLIC PANEL WITH GFR - Magnesium - Lipid panel - TSH - Hemoglobin A1c - Insulin, random - VITAMIN D 25 Hydroxy           Patient was  counseled in prudent diet, weight control to achieve/maintain BMI less than 25, BP monitoring, regular exercise and medications as discussed.  Discussed med effects and SE's. Routine screening labs and tests as requested with regular follow-up as recommended. Over 40 minutes of exam, counseling, chart review and high complex critical decision making was performed   Kirtland Bouchard, MD

## 2020-04-15 ENCOUNTER — Other Ambulatory Visit: Payer: Self-pay

## 2020-04-15 ENCOUNTER — Ambulatory Visit (INDEPENDENT_AMBULATORY_CARE_PROVIDER_SITE_OTHER): Payer: PPO | Admitting: Internal Medicine

## 2020-04-15 VITALS — BP 130/78 | HR 64 | Temp 97.3°F | Resp 16 | Ht 64.0 in | Wt 166.6 lb

## 2020-04-15 DIAGNOSIS — Z125 Encounter for screening for malignant neoplasm of prostate: Secondary | ICD-10-CM

## 2020-04-15 DIAGNOSIS — I700 Atherosclerosis of aorta: Secondary | ICD-10-CM | POA: Diagnosis not present

## 2020-04-15 DIAGNOSIS — Z87891 Personal history of nicotine dependence: Secondary | ICD-10-CM

## 2020-04-15 DIAGNOSIS — E782 Mixed hyperlipidemia: Secondary | ICD-10-CM | POA: Diagnosis not present

## 2020-04-15 DIAGNOSIS — Z0000 Encounter for general adult medical examination without abnormal findings: Secondary | ICD-10-CM

## 2020-04-15 DIAGNOSIS — R7309 Other abnormal glucose: Secondary | ICD-10-CM

## 2020-04-15 DIAGNOSIS — Z136 Encounter for screening for cardiovascular disorders: Secondary | ICD-10-CM

## 2020-04-15 DIAGNOSIS — I10 Essential (primary) hypertension: Secondary | ICD-10-CM

## 2020-04-15 DIAGNOSIS — Z79899 Other long term (current) drug therapy: Secondary | ICD-10-CM | POA: Diagnosis not present

## 2020-04-15 DIAGNOSIS — Z8249 Family history of ischemic heart disease and other diseases of the circulatory system: Secondary | ICD-10-CM | POA: Diagnosis not present

## 2020-04-15 DIAGNOSIS — N138 Other obstructive and reflux uropathy: Secondary | ICD-10-CM

## 2020-04-15 DIAGNOSIS — E559 Vitamin D deficiency, unspecified: Secondary | ICD-10-CM | POA: Diagnosis not present

## 2020-04-15 DIAGNOSIS — N401 Enlarged prostate with lower urinary tract symptoms: Secondary | ICD-10-CM | POA: Diagnosis not present

## 2020-04-15 DIAGNOSIS — Z1211 Encounter for screening for malignant neoplasm of colon: Secondary | ICD-10-CM

## 2020-04-15 DIAGNOSIS — Z0001 Encounter for general adult medical examination with abnormal findings: Secondary | ICD-10-CM

## 2020-04-15 DIAGNOSIS — J449 Chronic obstructive pulmonary disease, unspecified: Secondary | ICD-10-CM

## 2020-04-15 NOTE — Progress Notes (Signed)
AortaScan < 3 cm. Within normal limits, per Dr McKeown. 

## 2020-04-16 ENCOUNTER — Other Ambulatory Visit: Payer: Self-pay | Admitting: Internal Medicine

## 2020-04-16 DIAGNOSIS — R7989 Other specified abnormal findings of blood chemistry: Secondary | ICD-10-CM

## 2020-04-16 LAB — CBC WITH DIFFERENTIAL/PLATELET
Absolute Monocytes: 1039 cells/uL — ABNORMAL HIGH (ref 200–950)
Basophils Absolute: 47 cells/uL (ref 0–200)
Basophils Relative: 0.3 %
Eosinophils Absolute: 16 cells/uL (ref 15–500)
Eosinophils Relative: 0.1 %
HCT: 44.2 % (ref 38.5–50.0)
Hemoglobin: 14.8 g/dL (ref 13.2–17.1)
Lymphs Abs: 4945 cells/uL — ABNORMAL HIGH (ref 850–3900)
MCH: 31.3 pg (ref 27.0–33.0)
MCHC: 33.5 g/dL (ref 32.0–36.0)
MCV: 93.4 fL (ref 80.0–100.0)
MPV: 10.2 fL (ref 7.5–12.5)
Monocytes Relative: 6.7 %
Neutro Abs: 9455 cells/uL — ABNORMAL HIGH (ref 1500–7800)
Neutrophils Relative %: 61 %
Platelets: 267 10*3/uL (ref 140–400)
RBC: 4.73 10*6/uL (ref 4.20–5.80)
RDW: 12.9 % (ref 11.0–15.0)
Total Lymphocyte: 31.9 %
WBC: 15.5 10*3/uL — ABNORMAL HIGH (ref 3.8–10.8)

## 2020-04-16 LAB — COMPLETE METABOLIC PANEL WITH GFR
AG Ratio: 1.9 (calc) (ref 1.0–2.5)
ALT: 52 U/L — ABNORMAL HIGH (ref 9–46)
AST: 26 U/L (ref 10–35)
Albumin: 4.1 g/dL (ref 3.6–5.1)
Alkaline phosphatase (APISO): 73 U/L (ref 35–144)
BUN: 17 mg/dL (ref 7–25)
CO2: 30 mmol/L (ref 20–32)
Calcium: 9.8 mg/dL (ref 8.6–10.3)
Chloride: 101 mmol/L (ref 98–110)
Creat: 0.84 mg/dL (ref 0.70–1.11)
GFR, Est African American: 95 mL/min/{1.73_m2} (ref >=60)
GFR, Est Non African American: 82 mL/min/{1.73_m2} (ref >=60)
Globulin: 2.2 g/dL (calc) (ref 1.9–3.7)
Glucose, Bld: 86 mg/dL (ref 65–99)
Potassium: 4.6 mmol/L (ref 3.5–5.3)
Sodium: 140 mmol/L (ref 135–146)
Total Bilirubin: 0.5 mg/dL (ref 0.2–1.2)
Total Protein: 6.3 g/dL (ref 6.1–8.1)

## 2020-04-16 LAB — MAGNESIUM: Magnesium: 2.4 mg/dL (ref 1.5–2.5)

## 2020-04-16 LAB — HEMOGLOBIN A1C
Hgb A1c MFr Bld: 5.7 % of total Hgb — ABNORMAL HIGH (ref <5.7)
Mean Plasma Glucose: 117 mg/dL
eAG (mmol/L): 6.5 mmol/L

## 2020-04-16 LAB — URINALYSIS, ROUTINE W REFLEX MICROSCOPIC
Bilirubin Urine: NEGATIVE
Glucose, UA: NEGATIVE
Hgb urine dipstick: NEGATIVE
Ketones, ur: NEGATIVE
Leukocytes,Ua: NEGATIVE
Nitrite: NEGATIVE
Protein, ur: NEGATIVE
Specific Gravity, Urine: 1.005 (ref 1.001–1.03)
pH: 7 (ref 5.0–8.0)

## 2020-04-16 LAB — LIPID PANEL
Cholesterol: 152 mg/dL (ref <200)
HDL: 60 mg/dL (ref >=40)
LDL Cholesterol (Calc): 74 mg/dL (calc)
Non-HDL Cholesterol (Calc): 92 mg/dL (calc) (ref <130)
Total CHOL/HDL Ratio: 2.5 (calc) (ref <5.0)
Triglycerides: 94 mg/dL (ref <150)

## 2020-04-16 LAB — VITAMIN D 25 HYDROXY (VIT D DEFICIENCY, FRACTURES): Vit D, 25-Hydroxy: 69 ng/mL (ref 30–100)

## 2020-04-16 LAB — MICROALBUMIN / CREATININE URINE RATIO
Creatinine, Urine: 17 mg/dL — ABNORMAL LOW (ref 20–320)
Microalb, Ur: <0.2 mg/dL

## 2020-04-16 LAB — INSULIN, RANDOM: Insulin: 8.1 u[IU]/mL

## 2020-04-16 LAB — TSH: TSH: 0.27 mIU/L — ABNORMAL LOW (ref 0.40–4.50)

## 2020-04-16 LAB — PSA: PSA: 0.66 ng/mL (ref <=4.0)

## 2020-04-16 NOTE — Progress Notes (Signed)
============================================================ -   Test results slightly outside the reference range are not unusual. If there is anything important, I will review this with you,  otherwise it is considered normal test values.  If you have further questions,  please do not hesitate to contact me at the office or via My Chart.  ============================================================ ============================================================  -  PSA - Very Low - Great   ============================================================ ============================================================  -  CBC shows Nl Hgb /Red Cell Count                                     WBC is elevated which is due to                                                                taking the Steroids (Dexamethasone)   ============================================================ ============================================================  -  TSH is slightly low, which means Thyroid hormone is slightly elevated in                                                               blood, So will continue to monitor closely   - Suggest call office schedule a lab visit in about 6 weeks                                                                                    to recheck Thyroid (TSH)   ============================================================ ============================================================  -  A1c = 5.7 -  borderline elevated blood sugar, So . . . . Marland Kitchen    - Avoid Sweets, Candy & White Stuff   - Rice, Potatoes, Breads &  Pasta  ============================================================ ============================================================  -  Vitamin D = 69 - Excellent  ============================================================ ============================================================  -  All Else - CBC - Kidneys - Electrolytes - Liver - Magnesium &  Thyroid    - all  Normal / OK ============================================================ ============================================================

## 2020-04-20 ENCOUNTER — Other Ambulatory Visit: Payer: Self-pay | Admitting: Internal Medicine

## 2020-04-20 ENCOUNTER — Telehealth: Payer: Self-pay | Admitting: *Deleted

## 2020-04-20 MED ORDER — PREGABALIN 150 MG PO CAPS: ORAL_CAPSULE | ORAL | 0 refills | Status: DC

## 2020-04-20 MED ORDER — DEXAMETHASONE 4 MG PO TABS: ORAL_TABLET | ORAL | 0 refills | Status: DC

## 2020-04-20 NOTE — Telephone Encounter (Signed)
Returned call to patient regarding sciatica pain. Per patient, there has been no improvement in his left sciatic pain. Per Dr Melford Aase, stop Gabapentin and new RX's for Lyrica 150 mg three times a day and Dexamethazone 4 mg (taper) sent to patient's pharmacy. Patient advised to call back, if pain does not improve.

## 2020-04-27 ENCOUNTER — Other Ambulatory Visit: Payer: Self-pay

## 2020-04-27 ENCOUNTER — Encounter (INDEPENDENT_AMBULATORY_CARE_PROVIDER_SITE_OTHER): Payer: PPO | Admitting: Ophthalmology

## 2020-04-27 DIAGNOSIS — H34812 Central retinal vein occlusion, left eye, with macular edema: Secondary | ICD-10-CM

## 2020-04-27 DIAGNOSIS — H35033 Hypertensive retinopathy, bilateral: Secondary | ICD-10-CM

## 2020-04-27 DIAGNOSIS — I10 Essential (primary) hypertension: Secondary | ICD-10-CM

## 2020-04-27 DIAGNOSIS — H43813 Vitreous degeneration, bilateral: Secondary | ICD-10-CM

## 2020-05-07 ENCOUNTER — Other Ambulatory Visit: Payer: Self-pay | Admitting: Internal Medicine

## 2020-05-19 ENCOUNTER — Other Ambulatory Visit: Payer: Self-pay | Admitting: Internal Medicine

## 2020-05-19 DIAGNOSIS — M5432 Sciatica, left side: Secondary | ICD-10-CM

## 2020-05-20 ENCOUNTER — Telehealth: Payer: Self-pay | Admitting: *Deleted

## 2020-05-20 NOTE — Telephone Encounter (Signed)
Patient continues to have sciatic pain, but is only taking Tylenol 1000 mg 4 times a day. Per Dr Melford Aase, restart Lyrica 150 mg 3 to 4 times daily, with the Tylenol. Patient is aware and MRI will be scheduled.

## 2020-05-21 ENCOUNTER — Ambulatory Visit
Admission: RE | Admit: 2020-05-21 | Discharge: 2020-05-21 | Disposition: A | Discharge status: Home / Self Care | Payer: PPO | Source: Ambulatory Visit | Attending: Internal Medicine | Admitting: Internal Medicine

## 2020-05-21 DIAGNOSIS — M545 Low back pain, unspecified: Secondary | ICD-10-CM | POA: Diagnosis not present

## 2020-05-21 DIAGNOSIS — M5432 Sciatica, left side: Secondary | ICD-10-CM

## 2020-05-21 DIAGNOSIS — M48061 Spinal stenosis, lumbar region without neurogenic claudication: Secondary | ICD-10-CM | POA: Diagnosis not present

## 2020-05-22 ENCOUNTER — Other Ambulatory Visit: Payer: Self-pay | Admitting: Internal Medicine

## 2020-05-22 DIAGNOSIS — M48061 Spinal stenosis, lumbar region without neurogenic claudication: Secondary | ICD-10-CM

## 2020-05-22 DIAGNOSIS — M5136 Other intervertebral disc degeneration, lumbar region: Secondary | ICD-10-CM

## 2020-05-22 DIAGNOSIS — M5416 Radiculopathy, lumbar region: Secondary | ICD-10-CM

## 2020-05-22 DIAGNOSIS — M5432 Sciatica, left side: Secondary | ICD-10-CM

## 2020-05-22 NOTE — Progress Notes (Signed)
============================================================ ============================================================    Sat 05/22/20  -  MRI shows severe Spinal Stenosis  & Disk bulge at the L5 Nerve                                                     consistent with your symptoms of Left sciatica  - Will request Referral to Neurosurgery - Dr Lorenza Burton al as discussed  ============================================================ ============================================================

## 2020-06-01 NOTE — Progress Notes (Signed)
Will see him

## 2020-06-02 DIAGNOSIS — R310 Gross hematuria: Secondary | ICD-10-CM | POA: Diagnosis not present

## 2020-06-02 DIAGNOSIS — Z8551 Personal history of malignant neoplasm of bladder: Secondary | ICD-10-CM | POA: Diagnosis not present

## 2020-06-29 ENCOUNTER — Encounter (INDEPENDENT_AMBULATORY_CARE_PROVIDER_SITE_OTHER): Payer: PPO | Admitting: Ophthalmology

## 2020-06-29 ENCOUNTER — Other Ambulatory Visit: Payer: Self-pay

## 2020-06-29 DIAGNOSIS — H35033 Hypertensive retinopathy, bilateral: Secondary | ICD-10-CM | POA: Diagnosis not present

## 2020-06-29 DIAGNOSIS — I10 Essential (primary) hypertension: Secondary | ICD-10-CM

## 2020-06-29 DIAGNOSIS — H43813 Vitreous degeneration, bilateral: Secondary | ICD-10-CM

## 2020-06-29 DIAGNOSIS — H34812 Central retinal vein occlusion, left eye, with macular edema: Secondary | ICD-10-CM | POA: Diagnosis not present

## 2020-09-14 ENCOUNTER — Other Ambulatory Visit: Payer: Self-pay | Admitting: Adult Health

## 2020-09-21 ENCOUNTER — Other Ambulatory Visit: Payer: Self-pay

## 2020-09-21 ENCOUNTER — Encounter (INDEPENDENT_AMBULATORY_CARE_PROVIDER_SITE_OTHER): Payer: PPO | Admitting: Ophthalmology

## 2020-09-21 DIAGNOSIS — H2513 Age-related nuclear cataract, bilateral: Secondary | ICD-10-CM | POA: Diagnosis not present

## 2020-09-21 DIAGNOSIS — I10 Essential (primary) hypertension: Secondary | ICD-10-CM

## 2020-09-21 DIAGNOSIS — H34812 Central retinal vein occlusion, left eye, with macular edema: Secondary | ICD-10-CM

## 2020-09-21 DIAGNOSIS — H35033 Hypertensive retinopathy, bilateral: Secondary | ICD-10-CM

## 2020-09-21 DIAGNOSIS — H43813 Vitreous degeneration, bilateral: Secondary | ICD-10-CM

## 2020-10-21 ENCOUNTER — Ambulatory Visit: Payer: PPO | Admitting: Nurse Practitioner

## 2020-10-27 NOTE — Progress Notes (Signed)
MEDICARE ANNUAL WELLNESS VISIT AND FOLLOW UP Assessment:   Diagnoses and all orders for this visit:  Encounter for Medicare annual wellness exam Health Maintenance UTD Due annually   Aortic atherosclerosis ([place]) Per CT 03/2018 Control blood pressure, cholesterol, glucose, increase exercise.   COPD Currently on no medications Monitor symptoms  Essential hypertension Continue medications Monitor blood pressure at home; call if consistently over 130/80 Continue DASH diet.   Reminder to go to the ER if any CP, SOB, nausea, dizziness, severe HA, changes vision/speech, left arm numbness and tingling and jaw pain. CBC  Vitamin D deficiency At goal at recent check; continue to recommend supplementation for goal of 60-100 Check vitamin D level  Other abnormal glucose Recent A1Cs at goal Discussed diet/exercise, weight management  Check A1C; check CMP  Hyperlipidemia Continue rosuvastatin  Continue low cholesterol diet and exercise.  Check lipid panel.   Overweight (BMI 25.0-29.9) Long discussion about weight loss, diet, and exercise Recommended diet heavy in fruits and veggies and low in animal meats, cheeses, and dairy products, appropriate calorie intake Discussed appropriate weight for height  Follow up at next visit  Medication management CBC, CMP/GFR  History of smoking CXR 04/2019 no changes  History of lung cancer S/p VATS RUL lobectomy, 10 years without recurrence; Released by Dr. Earlie Server Following annual CXR, UTD  Hx of bladder/prostate cancer denies urinary symptoms, followed by Dr. Diona Fanti, last visit 06/03/20  Hearing loss, bilateral Continue hearing aids   Insomnia Insomnia- good sleep hygiene discussed, increase day time activity, try going to sleep at night at consistent time Will try Elavil 92m 1/2 -1 tab at bedtime as needed  Elevated TSH Recheck TSH today  Screening for hematuria/Proteinuria Microalbumin/creatinine ratio U/A routine with  microscopic  Spinal stenosis of lumbar region with radiculopathy Refer to [facility]  Over 30 minutes of exam, counseling, chart review, and critical decision making was performed  Future Appointments  Date Time [place] 11/23/2020  7:30 AM MHayden Pedro MD [facility] None  04/25/2021 10:00 AM MUnk Pinto MD [facility] None  10/21/2021 11:00 AM MMagda Bernheim NP [facility] None     Plan:   During the course of the visit the patient was educated and counseled about appropriate screening and preventive services including:   Pneumococcal vaccine  Influenza vaccine Prevnar 13 Td vaccine Screening electrocardiogram Colorectal cancer screening Diabetes screening Glaucoma screening Nutrition counseling    Subjective:  Raymond OLDis a 85y.o. male who presents for Medicare Annual Wellness Visit and 3 month follow up for HTN, hyperlipidemia, glucose management, and vitamin D Def.   Patient is s/p RUL lobectomy by VATS  for Primary Lung Cancer in 2008 followed by Dr MJulien Nordmann(has since been released, doing annual CXR, last in March 2021) and also is s/p TURP for Prostate Cancer & Bladder cancer in 2013 by Dr DDiona Fanti continues to follow up with him doing annual cysto, recently negative in 03/2018. Also follows with hx of kidney stones, had lithotripsy last year without problems since.   Reports some difficulty sleeping in the last year, has been having trouble falling asleep. Will fall asleep at 1 AM and wakes up at 6 am.  Enjoying being more social, trying to get out more.   Continues to have low back pain with rare radiation down his leg, never saw orthopedic from last visit but is interested in having it further evaluated  BMI is Body mass index is 27.84 kg/m., he has been working  on diet and exercise. He is very active and aims to get 10000 steps daily, walks 1-2 hours 3 days a week as well.  Wt Readings from Last 3 Encounters:  10/28/20 162 lb 3.2  oz (73.6 kg)  04/15/20 166 lb 9.6 oz (75.6 kg)  04/07/20 164 lb 6.4 oz (74.6 kg)   Pt stating she is having more difficulty remembering things.   He has abdominal aortic atherosclerosis per Ct 03/2018.  His blood pressure has been controlled at home, today their BP is BP: 124/80 He does workout. He denies chest pain, shortness of breath, dizziness.   He is on cholesterol medication (rosuvastatin 10 mg every other day, he has been on pravastatin and lipitor in the past) and denies myalgias. His cholesterol is at goal. The cholesterol last visit was:   Lab Results  Component Value Date   CHOL 152 04/15/2020   HDL 60 04/15/2020   LDLCALC 74 04/15/2020   TRIG 94 04/15/2020   CHOLHDL 2.5 04/15/2020   He has been working on diet and exercise for glucose management, and denies foot ulcerations, increased appetite, nausea, paresthesia of the feet, polydipsia, polyuria, visual disturbances and vomiting. Last A1C in the office was:  Lab Results  Component Value Date   HGBA1C 5.7 (H) 04/15/2020    Last GFR:  Lab Results  Component Value Date   [facility] 82 04/15/2020    Patient is on Vitamin D supplement and at goal at recent check:   Lab Results  Component Value Date   VD25OH 69 04/15/2020      Medication Review: Current Outpatient Medications on File Prior to Visit  Medication Sig Dispense Refill   aspirin 81 MG tablet Take 81 mg by mouth daily.     cetirizine (ZYRTEC) 10 MG tablet Take 1 tablet (10 mg total) by mouth daily. One tab daily for allergies 30 tablet 3   Cholecalciferol (VITAMIN D) 2000 UNITS CAPS Take 8,000 Units by mouth daily.      glucose blood (FREESTYLE TEST STRIPS) test strip Check blood sugar 1 time daily-DX-R73.03. 100 each 1   glucose monitoring kit (FREESTYLE) monitoring kit Check blood sugar 1 time daily-DX-R73.03. 1 each 0   Lancets (FREESTYLE) lancets Check blood sugar 1 time daily-DX-R73.03. 100 each 1   Multiple Vitamin (MULTIVITAMIN WITH MINERALS) TABS  Take 1 tablet by mouth every evening.      Omega-3 Fatty Acids (FISH OIL) 1000 MG CAPS Take 1 capsule by mouth every evening.      rosuvastatin (CRESTOR) 10 MG tablet Take  1 tablet  every other day (on even days)  for Cholesterol / Patient knows to take by mouth 45 tablet 3   vitamin C (ASCORBIC ACID) 500 MG tablet Take 500 mg by mouth daily.     dexamethasone (DECADRON) 4 MG tablet Take 1 tab 3 x day - 3 days, then 2 x day - 3 days, then 1 tab daily 20 tablet 0   pregabalin (LYRICA) 150 MG capsule Take 1 capsule 3 x /day for Sciatica pain (Patient not taking: Reported on 10/28/2020) 90 capsule 0   traZODone (DESYREL) 50 MG tablet 1/2-2 tablet 1 hour prior to bedtime for mood and sleep (Patient not taking: Reported on 10/28/2020) 30 tablet 0   No current facility-administered medications on file prior to visit.    Allergies: Allergies  Allergen Reactions   Nitrofuran Derivatives Shortness Of Breath    macrobid    Current Problems (verified) has Hyperlipidemia; History of smoking;  Hypertension; Vitamin D deficiency; Abnormal glucose; History of lung cancer; Overweight (BMI 25.0-29.9); BPH (benign prostatic hyperplasia); Aortic atherosclerosis ([place]) by Abd  CTscan on 03/18/2018; Bilateral hearing loss; and Insomnia on their problem list.  Screening Tests Health Maintenance  Topic Date Due   INFLUENZA VACCINE  09/06/2020   COVID-19 Vaccine (4 - Booster for [place] series) 11/13/2020 (Originally 04/02/2020)   Zoster Vaccines- Shingrix (1 of 2) 01/27/2021 (Originally 11/15/1954)   TETANUS/TDAP  10/28/2021 (Originally 03/09/2020)   COLONOSCOPY (Pts 45-46yr Insurance coverage will need to be confirmed)  12/10/2021   HPV VACCINES  Aged Out   Preventative care: Last colonoscopy: 12/2018, polyps, DONE per GI due to age   Prior vaccinations: TD or Tdap: 2012  Influenza: 11/2018  Pneumococcal: 2004, 2014 Prevnar13: 2015 Shingles/Zostavax: 2016 Covid 19: 2/2, 2021, pfizer   Names of Other  Physician/Practitioners you currently use: 1. [facility] here for primary care 2. Dr. MElder Negus eye doctor, last visit 2021, mild Left cataract, has has L macular edema followed by Dr. MZigmund Danielq7-8 weeks, doing injections 3. [employer] dentist, last visit 07/2020  Patient Care Team: MUnk Pinto MD as PCP - General (Internal Medicine) DFranchot Gallo MD as Consulting Physician (Urology) MCurt Bears MD as Consulting Physician (Oncology) BLorretta Harp MD as Consulting Physician (Cardiology) KInda Castle MD (Inactive) as Consulting Physician (Gastroenterology) GMelissa Noon O[place]er [place] Referring Physician (Optometry)  Surgical: He  has a past surgical history that includes Lung removal, partial (2008); Lithotripsy (2012); Fiberoptic bronchoscopy (10-28-2008); RIGHT UPPER LUNG LOBECTOMY (09-21-2006  DR BArlyce Dice; Transurethral resection of prostate (12/25/2011); Transurethral resection of bladder tumor (12/25/2011); Prostate surgery; Colonoscopy (05-09-2004); and Extracorporeal shock wave lithotripsy (Left, 04/18/2018). Family His family history includes Cancer in his father and mother; Cancer (age of onset: 625 in his brother; Cancer (age of onset: 654 in his brother; Colon cancer in his father; Colon polyps in his mother; Diabetes in his maternal grandmother and mother; Heart failure in his maternal grandmother; Hypertension in his mother; Pneumonia in his maternal grandfather. Social history  He reports that he quit smoking about 16 years ago. His smoking use included cigarettes. He has a 35.00 pack-year smoking history. He has never used smokeless tobacco. He reports current alcohol use. He reports that he does not use drugs.  MEDICARE WELLNESS OBJECTIVES: Physical activity: Current Exercise Habits: Home exercise routine, Type of exercise: walking, Time (Minutes): 40, Frequency (Times/Week): 3, Weekly Exercise (Minutes/Week): 120,  Intensity: Mild, Exercise limited by: None identified Cardiac risk factors: Cardiac Risk Factors include: dyslipidemia;advanced age (>51m, >6>42omen);male gender;smoking/ tobacco exposure;hypertension Depression/mood screen:   [facility] 10/28/2020  Decreased Interest 0  Down, Depressed, Hopeless 0  PHQ - 2 Score 0    ADLs:  In your present state of health, do you have any difficulty performing the following activities: 10/28/2020 04/14/2020  Hearing? Y N  Vision? N N  Difficulty concentrating or making decisions? N N  Walking or climbing stairs? N N  Dressing or bathing? N N  Doing errands, shopping? N N  Some recent data might be hidden     Cognitive Testing  Alert? Yes  Normal Appearance?Yes  Oriented to person? Yes  Place? Yes   Time? Yes  Recall of three objects?  Yes  Can perform simple calculations? Yes  Displays appropriate judgment?Yes  Can read the correct time from a watch face?Yes  EOL planning: Does Patient Have a Medical Advance Directive?: Yes Type of Advance Directive: Healthcare  Power of Attorney, Living will Does patient want to make changes to medical advance directive?: No - Patient declined Copy of [place] in Chart?: No - copy requested  Review of Systems  Constitutional:  Negative for chills, fever and weight loss.  HENT:  Positive for tinnitus. Negative for congestion and hearing loss.   Eyes:  Negative for blurred vision and double vision.  Respiratory:  Negative for cough and shortness of breath.   Cardiovascular:  Negative for chest pain, palpitations, orthopnea and leg swelling.  Gastrointestinal:  Negative for abdominal pain, constipation, diarrhea, heartburn, nausea and vomiting.  Genitourinary:  Negative for dysuria.  Musculoskeletal:  Negative for falls, joint pain and myalgias.  Skin:  Negative for rash.  Neurological:  Negative for dizziness, tingling, tremors, loss of consciousness and headaches.   Endo/Heme/Allergies:  Bruises/bleeds easily.  Psychiatric/Behavioral:  Positive for memory loss (difficulty remebering things quickly). Negative for depression and suicidal ideas. The patient has insomnia.    Objective:   Today's Vitals   10/28/20 0931  BP: 124/80  Pulse: 64  Temp: 97.7 F (36.5 C)  SpO2: 95%  Weight: 162 lb 3.2 oz (73.6 kg)    Body mass index is 27.84 kg/m.  General appearance: alert, no distress, WD/WN, male HEENT: normocephalic, sclerae anicteric, Bil ear canels clear, hearing aids, nares patent, no discharge or erythema, pharynx normal Oral cavity: MMM, no lesions Neck: supple, no lymphadenopathy, no thyromegaly, no masses Heart: RRR, normal S1, S2, no murmurs Lungs: CTA bilaterally, no wheezes, rhonchi, or rales Abdomen: +bs, soft, non tender, non distended, no masses, no hepatomegaly, no splenomegaly Musculoskeletal: nontender, no swelling, no obvious deformity Extremities: no edema, no cyanosis, no clubbing Pulses: 2+ symmetric, upper and lower extremities, normal cap refill Neurological: alert, oriented x 3, CN2-12 intact, strength normal upper extremities and lower extremities, sensation normal throughout, DTRs 2+ throughout, no cerebellar signs, gait normal Psychiatric: normal affect, behavior normal, pleasant . Oriented x 3 . 3 word recall done appropriately.  Medicare Attestation I have personally reviewed: The patient's medical and social history Their use of alcohol, tobacco or illicit drugs Their current medications and supplements The patient's functional ability including ADLs,fall risks, home safety risks, cognitive, and hearing and visual impairment Diet and physical activities Evidence for depression or mood disorders  The patient's weight, height, BMI, and visual acuity have been recorded in the chart.  I have made referrals, counseling, and provided education to the patient based on review of the above and I have provided the patient  with a written personalized care plan for preventive services.     Raymond Bernheim, NP   10/28/2020

## 2020-10-28 ENCOUNTER — Encounter: Payer: Self-pay | Admitting: Nurse Practitioner

## 2020-10-28 ENCOUNTER — Ambulatory Visit (INDEPENDENT_AMBULATORY_CARE_PROVIDER_SITE_OTHER): Payer: PPO | Admitting: Nurse Practitioner

## 2020-10-28 ENCOUNTER — Other Ambulatory Visit: Payer: Self-pay

## 2020-10-28 VITALS — BP 124/80 | HR 64 | Temp 97.7°F | Wt 162.2 lb

## 2020-10-28 DIAGNOSIS — E782 Mixed hyperlipidemia: Secondary | ICD-10-CM | POA: Diagnosis not present

## 2020-10-28 DIAGNOSIS — Z8551 Personal history of malignant neoplasm of bladder: Secondary | ICD-10-CM | POA: Diagnosis not present

## 2020-10-28 DIAGNOSIS — J449 Chronic obstructive pulmonary disease, unspecified: Secondary | ICD-10-CM

## 2020-10-28 DIAGNOSIS — G4700 Insomnia, unspecified: Secondary | ICD-10-CM

## 2020-10-28 DIAGNOSIS — Z0000 Encounter for general adult medical examination without abnormal findings: Secondary | ICD-10-CM

## 2020-10-28 DIAGNOSIS — Z8546 Personal history of malignant neoplasm of prostate: Secondary | ICD-10-CM

## 2020-10-28 DIAGNOSIS — R7989 Other specified abnormal findings of blood chemistry: Secondary | ICD-10-CM

## 2020-10-28 DIAGNOSIS — Z23 Encounter for immunization: Secondary | ICD-10-CM

## 2020-10-28 DIAGNOSIS — R7309 Other abnormal glucose: Secondary | ICD-10-CM

## 2020-10-28 DIAGNOSIS — I10 Essential (primary) hypertension: Secondary | ICD-10-CM

## 2020-10-28 DIAGNOSIS — I700 Atherosclerosis of aorta: Secondary | ICD-10-CM

## 2020-10-28 DIAGNOSIS — Z79899 Other long term (current) drug therapy: Secondary | ICD-10-CM | POA: Diagnosis not present

## 2020-10-28 DIAGNOSIS — Z1389 Encounter for screening for other disorder: Secondary | ICD-10-CM | POA: Diagnosis not present

## 2020-10-28 DIAGNOSIS — E663 Overweight: Secondary | ICD-10-CM

## 2020-10-28 DIAGNOSIS — M5416 Radiculopathy, lumbar region: Secondary | ICD-10-CM

## 2020-10-28 DIAGNOSIS — M48061 Spinal stenosis, lumbar region without neurogenic claudication: Secondary | ICD-10-CM

## 2020-10-28 DIAGNOSIS — Z87891 Personal history of nicotine dependence: Secondary | ICD-10-CM

## 2020-10-28 DIAGNOSIS — Z85118 Personal history of other malignant neoplasm of bronchus and lung: Secondary | ICD-10-CM | POA: Diagnosis not present

## 2020-10-28 DIAGNOSIS — E559 Vitamin D deficiency, unspecified: Secondary | ICD-10-CM | POA: Diagnosis not present

## 2020-10-28 DIAGNOSIS — H900 Conductive hearing loss, bilateral: Secondary | ICD-10-CM

## 2020-10-28 DIAGNOSIS — Z0001 Encounter for general adult medical examination with abnormal findings: Secondary | ICD-10-CM | POA: Diagnosis not present

## 2020-10-28 DIAGNOSIS — R6889 Other general symptoms and signs: Secondary | ICD-10-CM

## 2020-10-28 MED ORDER — AMITRIPTYLINE HCL 50 MG PO TABS
50.0000 mg | ORAL_TABLET | Freq: Every evening | ORAL | 0 refills | Status: DC | PRN
Start: 2020-10-28 — End: 2021-04-25

## 2020-10-28 NOTE — Patient Instructions (Signed)

## 2020-10-29 LAB — CBC WITH DIFFERENTIAL/PLATELET
Absolute Monocytes: 621 cells/uL (ref 200–950)
Basophils Absolute: 78 cells/uL (ref 0–200)
Basophils Relative: 0.8 %
Eosinophils Absolute: 78 cells/uL (ref 15–500)
Eosinophils Relative: 0.8 %
HCT: 43.5 % (ref 38.5–50.0)
Hemoglobin: 14.9 g/dL (ref 13.2–17.1)
Lymphs Abs: 4695 cells/uL — ABNORMAL HIGH (ref 850–3900)
MCH: 31.7 pg (ref 27.0–33.0)
MCHC: 34.3 g/dL (ref 32.0–36.0)
MCV: 92.6 fL (ref 80.0–100.0)
MPV: 10.6 fL (ref 7.5–12.5)
Monocytes Relative: 6.4 %
Neutro Abs: 4229 cells/uL (ref 1500–7800)
Neutrophils Relative %: 43.6 %
Platelets: 208 10*3/uL (ref 140–400)
RBC: 4.7 10*6/uL (ref 4.20–5.80)
RDW: 12.4 % (ref 11.0–15.0)
Total Lymphocyte: 48.4 %
WBC: 9.7 10*3/uL (ref 3.8–10.8)

## 2020-10-29 LAB — COMPLETE METABOLIC PANEL WITH GFR
AG Ratio: 2.2 (calc) (ref 1.0–2.5)
ALT: 17 U/L (ref 9–46)
AST: 21 U/L (ref 10–35)
Albumin: 4.4 g/dL (ref 3.6–5.1)
Alkaline phosphatase (APISO): 77 U/L (ref 35–144)
BUN: 14 mg/dL (ref 7–25)
CO2: 29 mmol/L (ref 20–32)
Calcium: 10 mg/dL (ref 8.6–10.3)
Chloride: 107 mmol/L (ref 98–110)
Creat: 0.92 mg/dL (ref 0.70–1.22)
Globulin: 2 g/dL (calc) (ref 1.9–3.7)
Glucose, Bld: 95 mg/dL (ref 65–99)
Potassium: 4.2 mmol/L (ref 3.5–5.3)
Sodium: 144 mmol/L (ref 135–146)
Total Bilirubin: 1 mg/dL (ref 0.2–1.2)
Total Protein: 6.4 g/dL (ref 6.1–8.1)
eGFR: 83 mL/min/{1.73_m2} (ref >=60)

## 2020-10-29 LAB — URINALYSIS, ROUTINE W REFLEX MICROSCOPIC
Bacteria, UA: NONE SEEN /HPF
Bilirubin Urine: NEGATIVE
Glucose, UA: NEGATIVE
Hgb urine dipstick: NEGATIVE
Hyaline Cast: NONE SEEN /LPF
Ketones, ur: NEGATIVE
Nitrite: NEGATIVE
Protein, ur: NEGATIVE
Specific Gravity, Urine: 1.02 (ref 1.001–1.035)
Squamous Epithelial / HPF: NONE SEEN /HPF (ref <=5)
pH: 6.5 (ref 5.0–8.0)

## 2020-10-29 LAB — LIPID PANEL
Cholesterol: 154 mg/dL (ref <200)
HDL: 50 mg/dL (ref >=40)
LDL Cholesterol (Calc): 85 mg/dL (calc)
Non-HDL Cholesterol (Calc): 104 mg/dL (calc) (ref <130)
Total CHOL/HDL Ratio: 3.1 (calc) (ref <5.0)
Triglycerides: 96 mg/dL (ref <150)

## 2020-10-29 LAB — MICROALBUMIN / CREATININE URINE RATIO
Creatinine, Urine: 157 mg/dL (ref 20–320)
Microalb Creat Ratio: 13 mcg/mg creat (ref <30)
Microalb, Ur: 2.1 mg/dL

## 2020-10-29 LAB — VITAMIN D 25 HYDROXY (VIT D DEFICIENCY, FRACTURES): Vit D, 25-Hydroxy: 75 ng/mL (ref 30–100)

## 2020-10-29 LAB — MICROSCOPIC MESSAGE

## 2020-10-29 LAB — HEMOGLOBIN A1C
Hgb A1c MFr Bld: 5.5 % of total Hgb (ref <5.7)
Mean Plasma Glucose: 111 mg/dL
eAG (mmol/L): 6.2 mmol/L

## 2020-10-29 LAB — MAGNESIUM: Magnesium: 2.3 mg/dL (ref 1.5–2.5)

## 2020-10-29 LAB — TSH: TSH: 1.2 mIU/L (ref 0.40–4.50)

## 2020-11-23 ENCOUNTER — Other Ambulatory Visit: Payer: Self-pay

## 2020-11-23 ENCOUNTER — Encounter (INDEPENDENT_AMBULATORY_CARE_PROVIDER_SITE_OTHER): Payer: PPO | Admitting: Ophthalmology

## 2020-11-23 DIAGNOSIS — I10 Essential (primary) hypertension: Secondary | ICD-10-CM | POA: Diagnosis not present

## 2020-11-23 DIAGNOSIS — H43813 Vitreous degeneration, bilateral: Secondary | ICD-10-CM | POA: Diagnosis not present

## 2020-11-23 DIAGNOSIS — H34812 Central retinal vein occlusion, left eye, with macular edema: Secondary | ICD-10-CM

## 2020-11-23 DIAGNOSIS — H35033 Hypertensive retinopathy, bilateral: Secondary | ICD-10-CM | POA: Diagnosis not present

## 2020-12-14 IMAGING — CR DG CHEST 2V
3 series · 3 of 3 positions shown · non-contrast
Comparison: 02/07/2017

CLINICAL DATA: Annual physical, COPD, history of right upper lobe
lung cancer

EXAM:
CHEST - 2 VIEW

[chest pa]
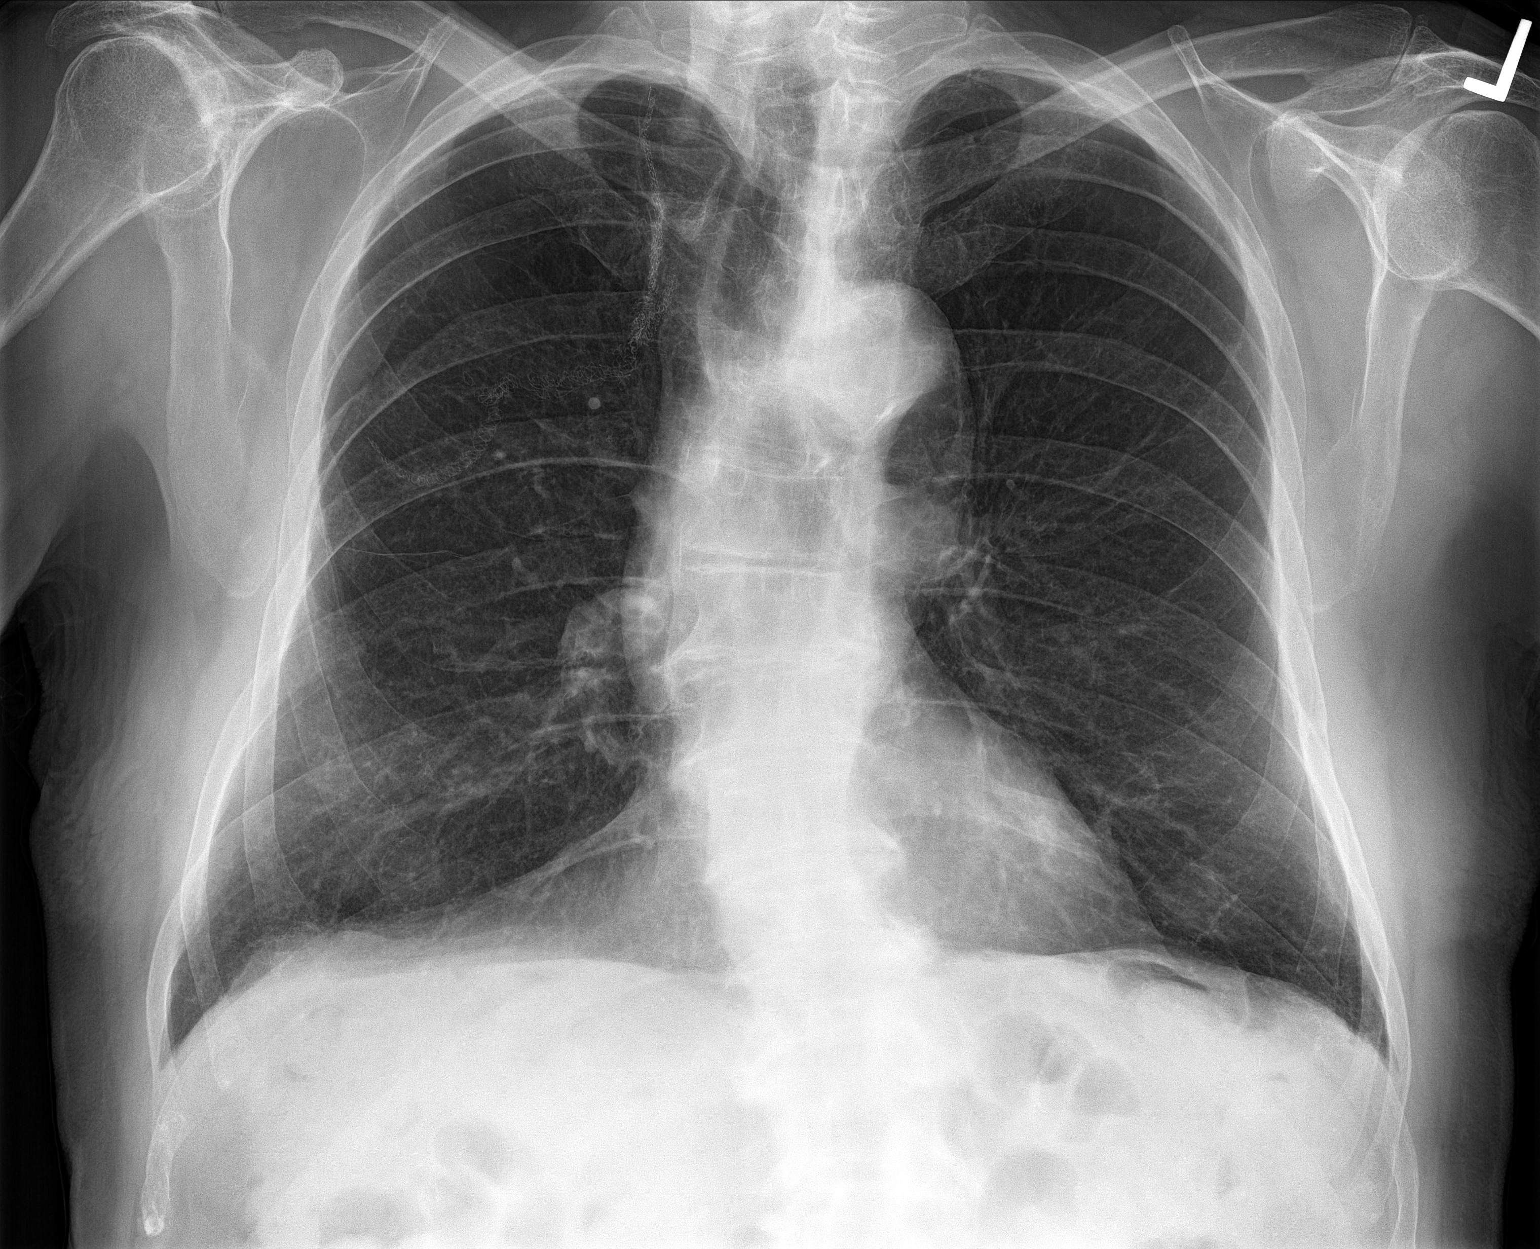

[chest lat (1 of 2)]
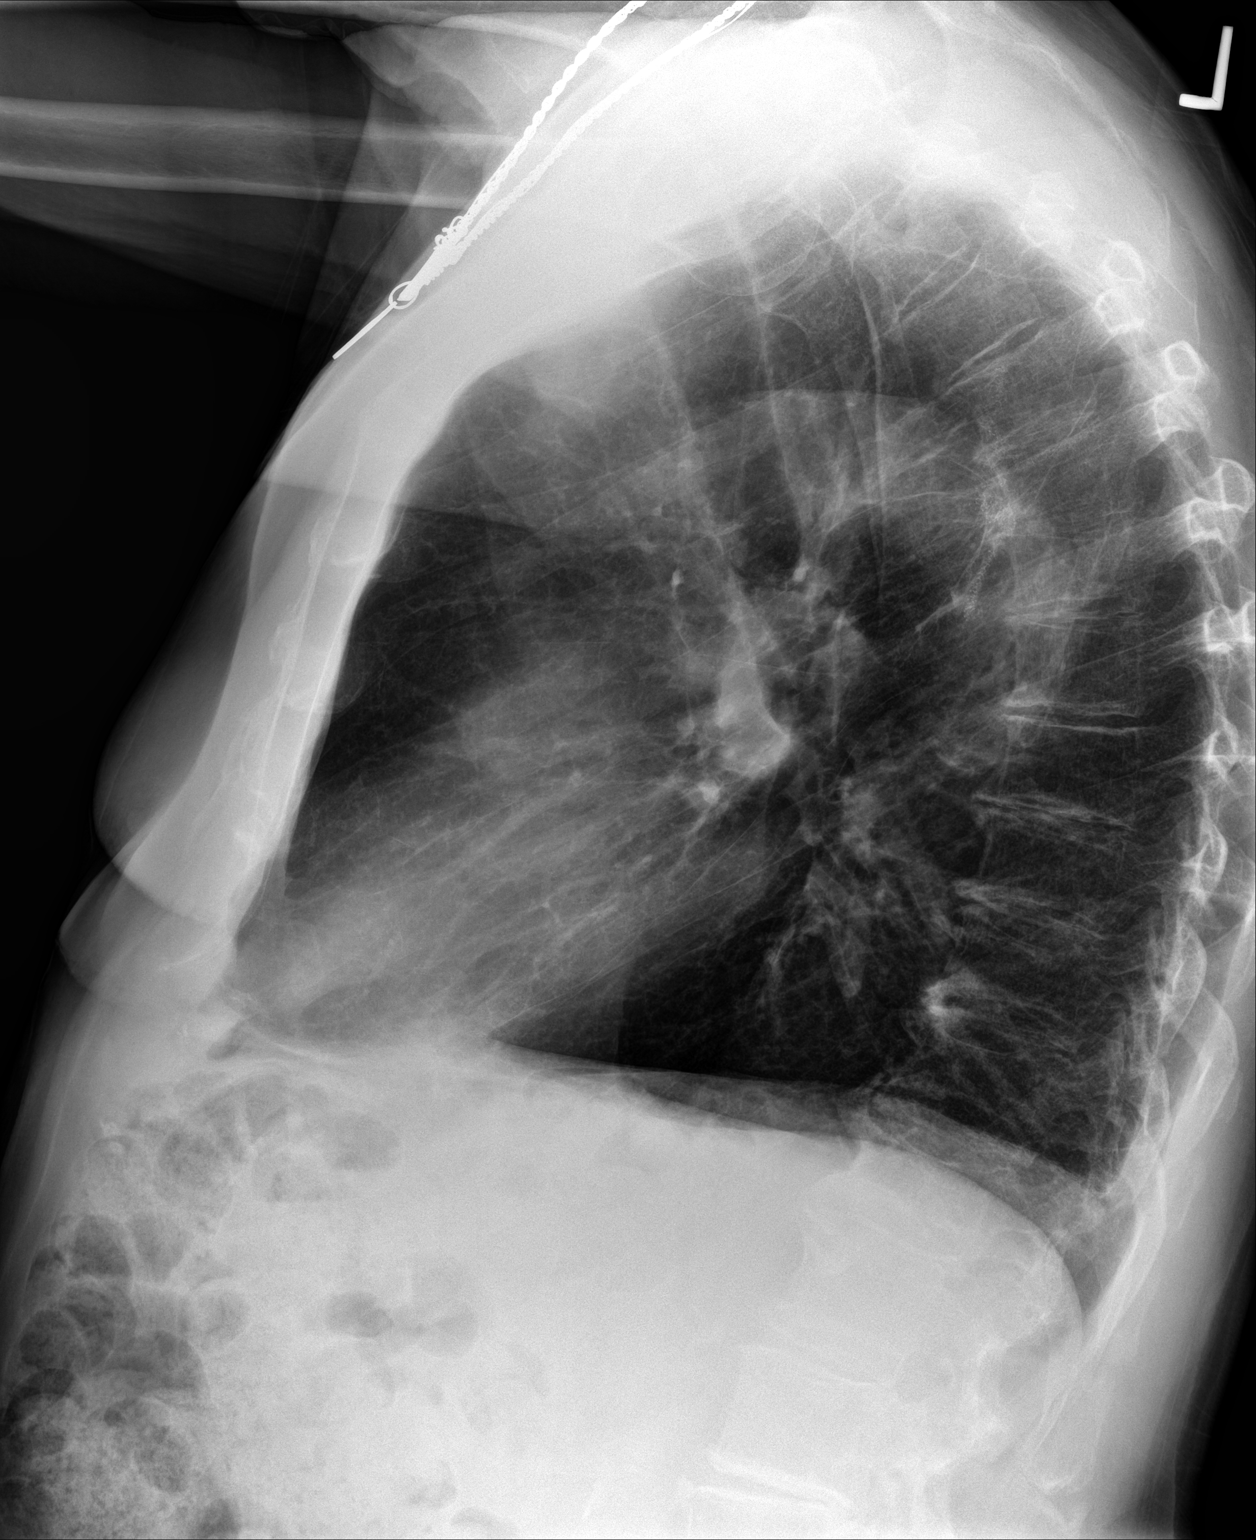

[chest lat (2 of 2)]
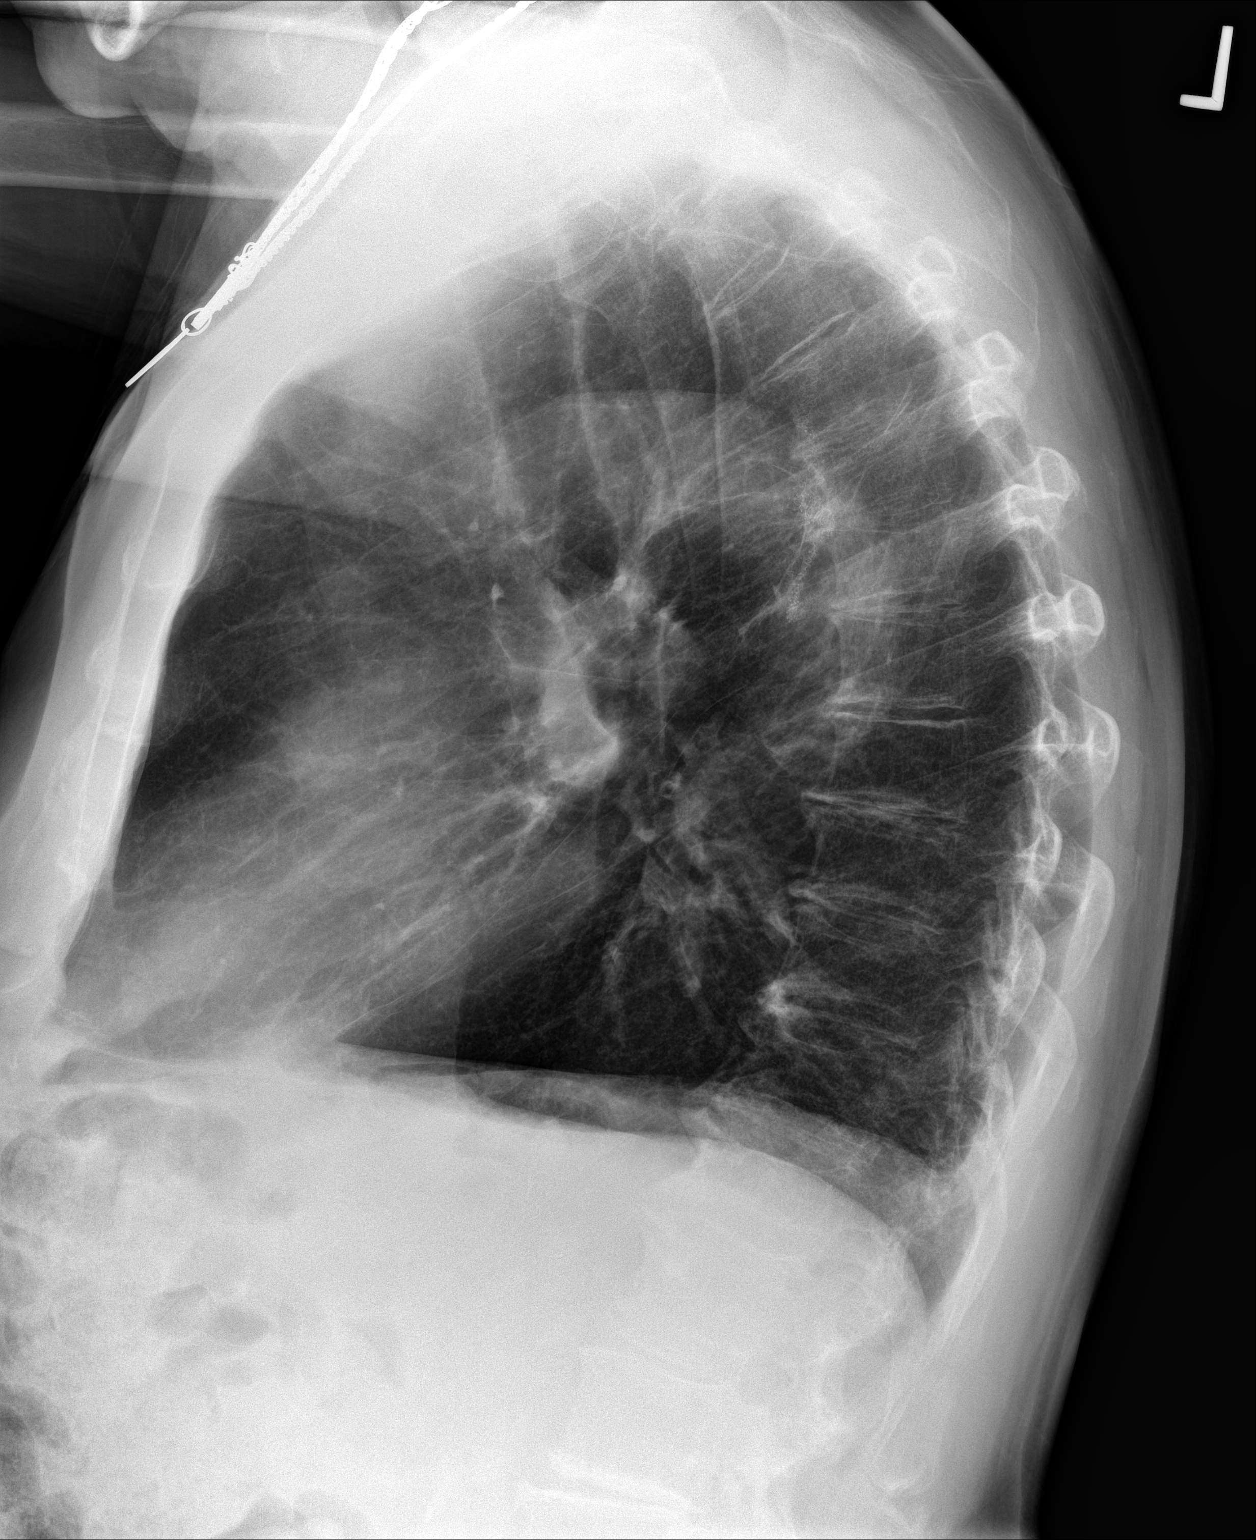

[3 of 3 positions shown; findings below may reference images not displayed]

FINDINGS: Postsurgical changes related to right upper lobe wedge resection.
Lungs otherwise clear. No pleural effusion or pneumothorax.

The heart is normal in size.

Degenerative changes of the visualized thoracolumbar spine.
IMPRESSION: Status post right upper lobe wedge resection.

No evidence of acute cardiopulmonary disease.

## 2020-12-27 ENCOUNTER — Other Ambulatory Visit (HOSPITAL_BASED_OUTPATIENT_CLINIC_OR_DEPARTMENT_OTHER): Payer: Self-pay

## 2020-12-27 ENCOUNTER — Ambulatory Visit: Payer: PPO | Attending: Internal Medicine

## 2020-12-27 DIAGNOSIS — Z23 Encounter for immunization: Secondary | ICD-10-CM

## 2020-12-27 MED ORDER — PFIZER COVID-19 VAC BIVALENT 30 MCG/0.3ML IM SUSP
INTRAMUSCULAR | 0 refills | Status: DC
  Filled 2020-12-27: qty 0.3, 1d supply, fill #0

## 2020-12-27 NOTE — Progress Notes (Signed)
   Covid-19 Vaccination Clinic  Name:  Raymond Patrick    MRN: [id number] DOB: 07/11/1935  12/27/2020  [name] was observed post Covid-19 immunization for 15 minutes without incident. He was provided with Vaccine Information Sheet and instruction to access the V-Safe system.   [name] was instructed to call 911 with any severe reactions post vaccine: Difficulty breathing  Swelling of face and throat  A fast heartbeat  A bad rash all over body  Dizziness and weakness   Immunizations Administered     Name Date Dose VIS Date Route   Pfizer Covid-19 Vaccine Bivalent Booster 12/27/2020 12:53 PM 0.3 mL 10/06/2020 Intramuscular   Manufacturer: [place]   Lot: [id number]   [place]: [phone]

## 2021-01-04 ENCOUNTER — Telehealth: Payer: Self-pay | Admitting: Internal Medicine

## 2021-01-04 NOTE — Chronic Care Management (AMB) (Signed)
  Chronic Care Management   Note  01/04/2021 Name: Raymond Patrick MRN: [id number] DOB: [birth year]  Raymond Patrick is a 85 y.o. year old male who is a primary care patient of [name], MD. I reached out to [name] by phone today in response to a referral sent by Raymond Patrick PCP, [name], MD.   Mr. Fitzsimmons was given information about Chronic Care Management services today including:  CCM service includes personalized support from designated clinical staff supervised by his physician, including individualized plan of care and coordination with other care providers 24/7 contact phone numbers for assistance for urgent and routine care needs. Service will only be billed when office clinical staff spend 20 minutes or more in a month to coordinate care. Only one practitioner may furnish and bill the service in a calendar month. The patient may stop CCM services at any time (effective at the end of the month) by phone call to the office staff.   Patient agreed to services and verbal consent obtained.   Follow up plan:   [place]ana Secretary/administrator

## 2021-01-25 ENCOUNTER — Other Ambulatory Visit: Payer: Self-pay

## 2021-01-25 ENCOUNTER — Encounter (INDEPENDENT_AMBULATORY_CARE_PROVIDER_SITE_OTHER): Payer: PPO | Admitting: Ophthalmology

## 2021-01-25 DIAGNOSIS — H43813 Vitreous degeneration, bilateral: Secondary | ICD-10-CM | POA: Diagnosis not present

## 2021-01-25 DIAGNOSIS — I10 Essential (primary) hypertension: Secondary | ICD-10-CM | POA: Diagnosis not present

## 2021-01-25 DIAGNOSIS — H34812 Central retinal vein occlusion, left eye, with macular edema: Secondary | ICD-10-CM | POA: Diagnosis not present

## 2021-01-25 DIAGNOSIS — H35033 Hypertensive retinopathy, bilateral: Secondary | ICD-10-CM | POA: Diagnosis not present

## 2021-02-09 DIAGNOSIS — M5416 Radiculopathy, lumbar region: Secondary | ICD-10-CM | POA: Diagnosis not present

## 2021-02-16 ENCOUNTER — Other Ambulatory Visit: Payer: Self-pay

## 2021-02-16 ENCOUNTER — Ambulatory Visit: Payer: PPO | Admitting: Pharmacist

## 2021-02-16 DIAGNOSIS — E559 Vitamin D deficiency, unspecified: Secondary | ICD-10-CM

## 2021-02-16 DIAGNOSIS — E782 Mixed hyperlipidemia: Secondary | ICD-10-CM

## 2021-02-16 DIAGNOSIS — G4700 Insomnia, unspecified: Secondary | ICD-10-CM

## 2021-02-16 DIAGNOSIS — R7309 Other abnormal glucose: Secondary | ICD-10-CM

## 2021-02-16 DIAGNOSIS — N400 Enlarged prostate without lower urinary tract symptoms: Secondary | ICD-10-CM

## 2021-02-16 DIAGNOSIS — I10 Essential (primary) hypertension: Secondary | ICD-10-CM

## 2021-02-18 NOTE — Progress Notes (Signed)
Pharmacist Visit [name], [name] [id number] 13 years, Male  DOB: [birth year]  M: [phone] Care Team: Rhys Martini, Ciel Yanes  __________________________________________________ Clinical Summary Situation:: Raymond Patrick is an 86 year old friendly male who presents for CCM initial tele visit. Pt has no chief complaints today Background:: Pt is doing well and chronic conditions mostly controlled. Pt is taking 2 prescription medications Amitriptyline as needed for sleep and Rosuvastatin. Pt stays very active and walks 2-3 miles every other days and tries to run errands to stay busy and active Assessment:: Patient is due for the following immunizations: Shingrix and Pnuemovax Recommendations:: Immunizations: Pt will obtain Shingrix Immunization from [employer]. Pt would like to get Pneumonia vaccine at next office visit. Will enroll patient in blood pressure remote patient monitoring when available Attestation Statement:: CCM Services:  This encounter meets complex CCM services and moderate to high medical decision making.  Prior to outreach and patient consent for Chronic Care Management, I referred this patient for services after reviewing the nominated patient list or from a personal encounter with the patient.  I have personally reviewed this encounter including the documentation in this note and have collaborated with the care management provider regarding care management and care coordination activities to include development and update of the comprehensive care plan I am certifying that I agree with the content of this note and encounter as supervising physician.   Chronic Conditions Patient's Chronic Conditions: Hypertension (HTN), Hyperlipidemia/Dyslipidemia (HLD), Benign Prostatic Hyperplasia (BPH), Insomnia, Diabetes (DM), Vitamin D deficiency,  Bilateral hearing loss, hx lung cancer  Doctor and Hospital Visits Were there PCP Visits in last 6 months?: Yes Visit #1:  10/28/20- Dr. Melford Aase (PCP)- Pt. presented for AWV and f/u. STOPPED  Dexamethasone 4 MG Take 1 tab 3 x day - 3 days, then 2 x day - 3 days, then 1 tab daily Pregabalin 150 MG Take 1 capsule 3 x /day traZODone HCl 50 MG 1/2-2 tablet qd   ADDED  Amitriptyline HCl 50 mg Oral At bedtime PRN  Were there Specialist Visits in last 6 months?: No Was there a Hospital Visit in last 30 days?: No Were there other Hospital Visits in last 6 months?: No  Medication Information Are there any Medication discrepancies?: Yes Details:  Amitriptyline 50mg - LFD 10/28/20 30DS Rosuvastatin 10mg - LFD 10/28/20 40DS Blood Glucose Freestyle test strips- LFD 08/02/16 100DS Freestyle lancets- LFD 08/02/16 100DS Cetirizine 10mg - LFD 02/10/15 30DS  Are there any Medication adherence gaps (beyond 5 days past due)?: Yes Details:  Amitriptyline 50mg - LFD 10/28/20 (&gt;5 days gap)  30DS by [place] Rosuvastatin 10mg - 08/09/19 &amp; 10/28/20 (&gt;5 days gap)  40DS by [place] Blood Glucose Freestyle test strips- LFD 08/02/16 100DS by [place] Freestyle lancets- 08/02/16 (&gt;5 days gap)  100DS by [place] Cetirizine 10mg - 02/10/15 (&gt;5 days gap) 30DS by [place]  Medication adherence rates for the STAR rating drugs:  Rosuvastatin 10mg - 10/28/20 40DS  List Patient's current Care Gaps: No current Care Gaps identified  Disease Assessments Current BP: 124/80 Current HR: 64 taken on: 10/27/2020 Previous BP: 130/78 Previous HR: 64 taken on: 04/15/2020 Weight: 162 BMI: 27.84 Last GFR: 83 taken on: 10/27/2020 Why did the patient present?: CCM initial visit Marital status?: Single 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 Who does the patient spend their time with and what do they do?: Mostly  by himself. He does have a neighbor next  door who checks on him Lifestyle habits such as diet and exercise?: Diet: 3 meals a day. HE usually cooks breakfast and goes out for lunch and dinner 1 cup of coffee per day Water: 2-3 20 ounce bottle water Alcohol, tobacco, and illicit drug usage?: Alcohol: occasionally Tobacco: Former Research officer, trade union drugs: None What is the patient's sleep pattern?: Trouble staying asleep How many hours per night does patient typically sleep?: 6-8 hours Patient pleased with health care they are receiving?: Yes Family, occupational, and living circumstances relevant to overall health?: His family history includes Cancer in his father and mother; Cancer (age of onset: 47) in his brother; Cancer (age of onset: 3) in his brother; Colon cancer in his father; Colon polyps in his mother; Diabetes in his maternal grandmother and mother; Heart failure in his maternal grandmother; Hypertension in his mother; Pneumonia in his maternal grandfather. Name and location of Current pharmacy: [facility], [address] AT [place] Current Rx insurance plan: HTA Are meds synced by current pharmacy?: No Are meds delivered by current pharmacy?: No - delivery available but patient prefers to not use Would patient benefit from direct intervention of clinical lead in dispensing process to optimize clinical outcomes?: No Are [facility] pharmacy services available where patient lives?: Yes Is patient disadvantaged to use [facility]?: No [facility] services reviewed with patient and patient wishes to change pharmacy?: No Select reason patient declined to change pharmacies: Feels capable in managing meds on their own Does patient experience delays in picking up medications due to transportation concerns (getting to pharmacy)?: No  Hypertension (HTN) Assess this condition today?: Yes Is patient able to obtain BP reading today?: No Goal: <130/80 mmHG Hypertension  Stage: Elevated (SBP: 120-129 and DBP < 80) Is Patient checking BP at home?: No How often does patient miss taking their blood pressure medications?: No BP meds Has patient experienced hypotension, dizziness, falls or bradycardia?: No BP RPM device: Does patient qualify?: Yes We discussed: DASH diet:  following a diet emphasizing fruits and vegetables and low-fat dairy products along with whole grains, fish, poultry, and nuts. Reducing red meats and sugars., Targeting 150 minutes of aerobic activity per week, Reducing the amount of salt intake to 1500mg /per day., Other (provide details below) Details: Importance of checking blood pressure at home Assessment:: Controlled Drug: None Additional Info: Pt walks 2-3 miles every other day HC Follow up: N/A Pharmacist Follow up: Continue to assess office BP readings. Enroll pt in BP RPM when available  Hyperlipidemia/Dyslipidemia (HLD) Last Lipid panel on: 10/27/2020 TC (Goal<200): 154 LDL: 85 HDL (Goal>40): 50 TG (Goal<150): 96 ASCVD 10-year risk?is:: N/A due to Age > 79 Assess this condition today?: Yes LDL Goal: <100 Has patient tried and failed any HLD Medications?: No We discussed: How to reduce cholesterol through diet/weight management and physical activity., How a diet high in plant sterols (fruits/vegetables/nuts/whole grains/legumes) may reduce your cholesterol., Encouraged increasing fiber to a daily intake of 10-25g/day Assessment:: Controlled Drug: Rosuvastatin 10mg  QOD Assessment: Appropriate, Effective, Safe, Accessible HC Follow up: N/A Pharmacist Follow up: Lipid panel, LFTs, TSH, S/s rhabdo  Diabetes (DM) Current A1C: 5.5% taken on: 10/27/2020 Previous A1C: 5.7% taken on: 04/15/2020 Type: Pre-Diabetes/Impaired Fasting Glucose Assess this condition today?: Yes Goal A1C: < 6.5 % Type: Pre-Diabetes/Impaired Fasting Glucose We discussed: How to recognize and treat signs of hypoglycemia., Modifying lifestyle, including to  participate in  moderate physical activity (e.g., walking) at least 150 minutes per week., Low carbohydrate eating plan with an emphasis on whole grains, legumes, nuts, fruits, and vegetables and minimal refined and processed foods. Assessment:: Controlled Drug: None HC Follow up: N/A Pharmacist Follow up: Assess A1c, FBG  BPH Current PSA: 0.66 taken on: 04/15/2020 Assess this condition today?: Yes Are you experiencing any side effects from your BPH medication?: None What changes have you made to your diet / lifestyle to help manage your BPH symptoms?: Reducing liquid consumption for 2 hours before bedtime, Taking extra time to ensure complete voiding of the bladder (i.e. waiting a few moments then trying to void again) Completing BPH AUA Questionnaire today?: No How would you feel if you had to live with your urinary condition the way it is now, no better, no worse, for the rest of your life?: Delighted We discussed: Limit fluid intake for 2-3 hours before bedtime, Avoid decongestants and antihistamines Assessment:: Controlled Drug: None HC Follow up: N/A Pharmacist Follow up: Continue to monitor PSA level, monitor for urinary symptoms at next visit  Insomnia Patient has following issues with sleeping: Trouble staying asleep What time do you typically go to bed?: 11pm-1am How long does it typically take you to fall asleep?: 20 min What time do you typically wake up?: 7 am Do you nap during the day?: No What changes have you made to your bedtime routine to help with sleep?: Avoiding screens (TV, phone, tablets) 30-60 min prior to bed, Avoiding snacking close to bedtime We discussed: Avoiding screens (TV, phone, tablets) prior to bedtime, Ensuring room is cool and dark when trying to sleep, Avoiding naps during the day to help sleep longer at night, Establishing a nighttime routine including a consistent bedtime Assessment:: Controlled Drug: Amitriptyline 50mg  QHS for sleep Assessment:  Appropriate, Effective, Safe, Accessible Additional Info: Informed pt it is okay to cut in half or in 1/4 if pt feels groggy the next morning HC Follow up: N/A Pharmacist Follow up: Assess sleep at next visit  Vitamin D Deficiency Pertinent Labs: VitD 25OH (10/28/2020): 75 Assessment:: Controlled Drug: Vitamin D3 2000 IU take 2 tabs daily Assessment: Appropriate, Effective, Safe, Accessible HC Follow up: N/A Pharmacist Follow up: Assess Vit D level  Exercise, Diet and Non-Drug Coordination Needs Additional exercise counseling points. We discussed: targeting at least 150 minutes per week of moderate-intensity aerobic exercise., incorporating flexibility, balance, and strength training exercises Additional diet counseling points. We discussed: key components of the DASH diet, key components of a low-carb eating plan Discussed Non-Drug Care Coordination Needs: Yes Does Patient have Medication financial barriers?: No  Accountable Health Communities Health-Related Social Needs Screening Tool -  SDOH  ([email or web address].es) What is your living situation today? (ref #1): I have a steady place to live Think about the place you live. Do you have problems with any of the following? (ref #2): None of the above Within the past 12 months, you worried that your food would run out before you got money to buy more (ref #3): Never true Within the past 12 months, the food you bought just didn't last and you didn't have money to get more (ref #4): Never true In the past 12 months, has lack of reliable transportation kept you from medical appointments, meetings, work or from getting things needed for daily living? (ref #5): No In the past 12 months, has the electric, gas, oil, or water company threatened to shut off services in your home? (ref #6): No How often  does anyone, including family and friends, physically hurt you? (ref #7): Never (1) How often does  anyone, including family and friends, insult or talk down to you? (ref #8): Never (1) How often does anyone, including friends and family, threaten you with harm? (ref #9): Never (1) How often does anyone, including family and friends, scream or curse at you? (ref #10): Never (1)  Engagement Notes Newton Pigg on 02/16/2021 12:01 PM HC Chart/ CP prep: 30 minutes LM 02/15/21 CPP Chart Review: 30 min CPP Office Visit: 26 min CPP Office Visit Documentation: 36 min CPP Coordination of Care: [facility] Care Plan Completion: 25 min CPP Care Plan Review: 14 min  Engagement Notes Newton Pigg on 02/16/2021 12:01 PM HC F/u: N/A CPP F/u:  04/25/21: Ok [facility] w/ [facility] 07/19/21 @ 9am: CCM f/u visit ( Insomnia, HTN, LDL, A1c)  Care Gaps:  Shingrix: Will complete at [facility] Pneumovax: will complete at next PCP visit   Patient Name: Raymond Patrick, Raymond Patrick DOB:  March 27, 1935   Last Care Plan Update: 02/16/2021 COMPREHENSIVE CARE PLAN AND GOALS   HYPERTENSION  MOST RECENT BLOOD PRESSURE:     124/80 MY GOAL BLOOD PRESSURE:  <130/80 mmHG CURRENT MEDICATION AND DOSING:  None THE GOALS WE HAVE CHOSEN ARE:    -Maintain an at goal blood pressure  BARRIERS TO ACHIEVING GOALS:  -At goal PLAN TO WORK ON THESE GOALS:  -Check BP at home  -Reduce salt intake (< 1500mg / day)  -Diet: DASH diet (Choose fruits, vegetables, and low-fat dairy products. Increase whole grains, fish, poultry, nuts. Reduce red meats and sugars)  -Weight: 1 kg = ~1 mmHg reduction  -Exercise   CHOLESTEROL  MOST RECENT LABS:     -TOTAL CHOLESTEROL: 154 -TRIGLYCERIDES: 96 -HDL: 50 -LDL: 85 CURRENT MEDICATION AND DOSING:  Rosuvastatin 10mg  once daily THE GOALS WE HAVE CHOSEN ARE:    -Total Cholesterol goal under 200, Triglycerides goal under 150, HDL goal above 40, LDL goal under 100  BARRIERS TO ACHIEVING GOALS:  -At goal PLAN TO WORK ON THESE GOALS:  -Take medication regularly  -Diet: high in plant sterols (fruits/ vegetables/  nuts/ whole grains/ legumes). Increase fiber intake (10-25g/day). Avoid foods high in cholesterol (red meat, egg yolks, dairy, oils/ butter). Choose low-fat options.  -Exercise  -Weight Management  Prediabetes/ Impaired Fasting Glucose  MOST RECENT A1C:         5.7%                                                            Recent fasting glucose:  95 mg/dL 10/2020  CURRENT REGIMEN AND DOSING:  -None  THE GOALS WE HAVE CHOSEN ARE:  -Lower A1C/ Fasting Glucose  PLAN TO WORK ON THESE GOALS:    -Focus on managing/monitoring CVD risk factors, including keeping blood pressure and lipids under goal.   -Modify lifestyle, including participating in moderate physical activity (e.g., walking) at least 150 minutes per week.   -Consider a Mediterranean eating plan with an emphasis on whole grains, legumes, nuts, fruits, and vegetables and minimal refined and processed foods.       Benign Prostatic Hypertrophy (BPH) CURRENT REGIMEN AND DOSING:  None THE GOALS WE HAVE CHOSEN ARE:  -Reducing liquid consumption for 2 hours before bedtime  -Taking extra time to ensure complete voiding of the  bladder (i.e. waiting a few moments then trying to void again) BARRIERS TO ACHIEVING GOALS:  At goal PLAN TO WORK ON THESE GOALS:    -Limit fluid intake for 2-3 hours before bedtime, Avoid decongestants and antihistamines    ACTIVE MEDICATION LIST  Your current medication list has been updated. To view, log in to your patient portal.  Call if any changes need to be made.   MEDICATION REVIEW  MEDICATION REVIEW CONDUCTED:   Yes   DATE:    02/16/2021 BEST POSSIBLE MEDICATION HISTORY  SOURCE:   Medical Records     HOW DO I? - WHEN DO I?   GET AHOLD OF MY DOCTOR?   DURING BUSINESS HOURS WHEN THE OFFICE IS OPEN    PHONE: [phone] AFTER HOURS [facility] NURSE WHEN THE OFFICE IS CLOSED  PHONE: [phone]  TALK TO [place] CARE COORDINATOR NAME: Newton Pigg  PHONE: [phone] EMAIL:   [email or web address] CARE COORDINATOR STAFF   NAME: Vanetta Shawl, [facility]   Newton Pigg, CPP  PHONE: [phone]  NOTE SECTION  Thank you for participating in the Chronic Care Management (CCM) program with Dr. Newton Pigg   This program takes a proactive approach to your health and my team will serve as a resource for you throughout the year. Please follow up at [phone] if you have any questions or experience changes to your overall health. Your next CCM appointment will be conducted with Newton Pigg, PharmD as follows:    Date:   07/19/2021 Time:  9:00AM In [place]. Jeannett Senior, PharmD  Clinical Pharmacist  [email or web address]  [phone]

## 2021-03-07 DIAGNOSIS — E559 Vitamin D deficiency, unspecified: Secondary | ICD-10-CM | POA: Diagnosis not present

## 2021-03-07 DIAGNOSIS — N400 Enlarged prostate without lower urinary tract symptoms: Secondary | ICD-10-CM | POA: Diagnosis not present

## 2021-03-07 DIAGNOSIS — I10 Essential (primary) hypertension: Secondary | ICD-10-CM | POA: Diagnosis not present

## 2021-03-07 DIAGNOSIS — E782 Mixed hyperlipidemia: Secondary | ICD-10-CM | POA: Diagnosis not present

## 2021-04-05 ENCOUNTER — Encounter (INDEPENDENT_AMBULATORY_CARE_PROVIDER_SITE_OTHER): Payer: PPO | Admitting: Ophthalmology

## 2021-04-05 ENCOUNTER — Other Ambulatory Visit: Payer: Self-pay

## 2021-04-05 DIAGNOSIS — H43813 Vitreous degeneration, bilateral: Secondary | ICD-10-CM | POA: Diagnosis not present

## 2021-04-05 DIAGNOSIS — H2513 Age-related nuclear cataract, bilateral: Secondary | ICD-10-CM

## 2021-04-05 DIAGNOSIS — H35033 Hypertensive retinopathy, bilateral: Secondary | ICD-10-CM | POA: Diagnosis not present

## 2021-04-05 DIAGNOSIS — H34812 Central retinal vein occlusion, left eye, with macular edema: Secondary | ICD-10-CM

## 2021-04-05 DIAGNOSIS — I10 Essential (primary) hypertension: Secondary | ICD-10-CM | POA: Diagnosis not present

## 2021-04-19 NOTE — Progress Notes (Signed)
Assessment and Plan: ? ?Raymond Patrick was seen today for an episodic visit. ? ?Diagnoses and all order for this visit: ? ?1. Primary hypertension ?Controlled.  Continue lifestyle modifications.   ? ?2.  Cough   ?Continue OTC cough medication as needed as directed.   ? ?3. Headache ?May take OTC Tylenol or IBU as needed. ? ?4. Nasal Congestion ?Start OTC Mucinex as directed.  Continue to stay well hydrated to keep mucus thin and productive.   ? ?5.  Bronchitis ?Start Prednisone as directed.  Continue to monitor BG. Rest. ? ?Lack of antibiotic effectiveness discussed with him. Call or return to clinic prn if these symptoms worsen or fail to improve as anticipated. ? ? ?Further disposition pending results of labs. Discussed med's effects and SE's.   ?Over 30 minutes of exam, counseling, chart review, and critical decision making was performed.  ? ?Future Appointments  ?Date Time Provider [place]  ?04/20/2021  8:45 AM Darrol Jump, NP [facility] None  ?04/25/2021 10:00 AM [name], MD [facility] None  ?06/14/2021  7:30 AM Hayden Pedro, MD [facility] None  ?07/19/2021  9:00 AM Newton Pigg, RPH [facility] None  ?10/21/2021 11:00 AM Magda Bernheim, NP [facility] None  ? ?HPI ?BP 138/80   Pulse 87   Temp (!) 97.5 ?F (36.4 ?C)   Resp 18   Ht 5' 4.5" (1.638 m)   Wt 160 lb 9.6 oz (72.8 kg)   SpO2 95%   BMI 27.14 kg/m?  ? ?Raymond Patrick is a 86 y.o. male with a hx of bilateral hearing loss, HTN, hyperlipidemia and BPH who presents to clinic today with complaints of chest congestion, SOB, sneezing, fatigue, joint pain and productive cough with green phlegm that is improving.  Symptoms have lasted x 5 days.  He denies CP, HA, fever, chills, N/V.  Reports a history of reoccurring PNA as a child but has not had any PNA flare is "years."  Denies a history of asthma, COPD, smoker.  Has used OTC cough syrup (does not remember the name) with minimal effectiveness.  He has not been on a recent abx.  He reports  feeling "somewhat better" these last two days. NEGATIVE for Covid and Flu. ?  ? ? ?Past Medical History:  ?Diagnosis Date  ? Allergy   ? seasonal  ? Bladder tumor   ? Cancer [facility])   ? bladder,, lung  ? COPD (chronic obstructive pulmonary disease) ([place])   ? Diverticulosis of sigmoid colon   ? 2016  ? History of kidney stones   ? History of lung cancer 2008  S/P RIGHT UPPER LOPECTOMY   ? STAGE I  NON-SMALL CELL LUNG CANCER--  ONCOLOGIST- DR MOHAMED-   NO RECURRENCE  ? Hyperlipidemia   ? Hypertension   ? Impaired hearing bilateral aids  ? Irregular heart rate   ? per pt heart will skip a beat   ? Kidney stone 07/08/2019  ? Unspecified vitamin D deficiency   ?  ? ?Allergies  ?Allergen Reactions  ? Nitrofuran Derivatives Shortness Of Breath  ?  macrobid  ? ? ?Current Outpatient Medications on File Prior to Visit  ?Medication Sig  ? amitriptyline (ELAVIL) 50 MG tablet Take 1 tablet (50 mg total) by mouth at bedtime as needed for sleep.  ? aspirin 81 MG tablet Take 81 mg by mouth daily.  ? Cholecalciferol (VITAMIN D) 2000 UNITS CAPS Take 4,000 Units by mouth daily.  ? Multiple Vitamin (MULTIVITAMIN WITH MINERALS) TABS Take 1  tablet by mouth every evening.   ? Omega-3 Fatty Acids (FISH OIL) 1000 MG CAPS Take 1 capsule by mouth 2 (two) times a week.  ? rosuvastatin (CRESTOR) 10 MG tablet Take  1 tablet  every other day (on even days)  for Cholesterol / Patient knows to take by mouth  ? vitamin C (ASCORBIC ACID) 500 MG tablet Take 500 mg by mouth daily.  ? zinc gluconate 50 MG tablet Take 50 mg by mouth daily.  ? COVID-19 mRNA bivalent vaccine, Pfizer, (PFIZER COVID-19 VAC BIVALENT) injection Inject into the muscle.  ? glucose blood (FREESTYLE TEST STRIPS) test strip Check blood sugar 1 time daily-DX-R73.03. (Patient not taking: Reported on 04/20/2021)  ? glucose monitoring kit (FREESTYLE) monitoring kit Check blood sugar 1 time daily-DX-R73.03. (Patient not taking: Reported on 04/20/2021)  ? Lancets (FREESTYLE) lancets Check  blood sugar 1 time daily-DX-R73.03. (Patient not taking: Reported on 04/20/2021)  ? ?No current facility-administered medications on file prior to visit.  ? ? ?ROS: all negative except what is noted in the HPI.  ? ?Physical Exam: ? ?BP 138/80   Pulse 87   Temp (!) 97.5 ?F (36.4 ?C)   Resp 18   Ht 5' 4.5" (1.638 m)   Wt 160 lb 9.6 oz (72.8 kg)   SpO2 95%   BMI 27.14 kg/m?  ? ?General Appearance: NAD.  Awake, conversant and cooperative. ?Eyes: PERRLA, EOMs intact.  Sclera white.  Conjunctiva without erythema. ?Sinuses: No frontal/maxillary tenderness.  No nasal discharge. Nares patent.  ?ENT/Mouth: Ext aud canals clear.  Bilateral TMs w/DOL and without erythema or bulging. Hearing intact.  Posterior pharynx without swelling or exudate.  Tonsils without swelling or erythema.  ?Neck: Supple.  No masses, nodules or thyromegaly. ?Respiratory: POSITIVE mild inspiratory wheezing left upper posterior lung. All other lung fields WNL.  POSITIVE mild productive cough.  Effort is regular with non-labored breathing.  ?Cardio: RRR with no MRGs. Brisk peripheral pulses without edema.  ?Abdomen: Active BS in all four quadrants.  Soft and non-tender without guarding, rebound tenderness, hernias or masses. ?Lymphatics: Non tender without lymphadenopathy.  ?Musculoskeletal: Full ROM, 5/5 strength, normal ambulation.  No clubbing or cyanosis. ?Skin: Appropriate color for ethnicity. Warm without rashes, lesions, ecchymosis, ulcers.  ?Neuro: CN II-XII grossly normal. Normal muscle tone without cerebellar symptoms and intact sensation.   ?Psych: AO X 3,  appropriate mood and affect, insight and judgment.  ?------------------------------------------------------------------------------------------------------------------ ?  ? ?Darrol Jump, NP ?10:38 AM ?[facility] ? ? ?

## 2021-04-20 ENCOUNTER — Ambulatory Visit (INDEPENDENT_AMBULATORY_CARE_PROVIDER_SITE_OTHER): Payer: PPO | Admitting: Nurse Practitioner

## 2021-04-20 ENCOUNTER — Other Ambulatory Visit: Payer: Self-pay

## 2021-04-20 ENCOUNTER — Encounter: Payer: Self-pay | Admitting: Nurse Practitioner

## 2021-04-20 ENCOUNTER — Other Ambulatory Visit: Payer: Self-pay | Admitting: Nurse Practitioner

## 2021-04-20 VITALS — BP 138/80 | HR 87 | Temp 97.5°F | Resp 18 | Ht 64.5 in | Wt 160.6 lb

## 2021-04-20 DIAGNOSIS — J40 Bronchitis, not specified as acute or chronic: Secondary | ICD-10-CM | POA: Diagnosis not present

## 2021-04-20 DIAGNOSIS — G4483 Primary cough headache: Secondary | ICD-10-CM | POA: Diagnosis not present

## 2021-04-20 DIAGNOSIS — I10 Essential (primary) hypertension: Secondary | ICD-10-CM

## 2021-04-20 DIAGNOSIS — I700 Atherosclerosis of aorta: Secondary | ICD-10-CM

## 2021-04-20 DIAGNOSIS — R0981 Nasal congestion: Secondary | ICD-10-CM

## 2021-04-20 LAB — POC INFLUENZA A&B (BINAX/QUICKVUE)
Influenza A, POC: NEGATIVE
Influenza B, POC: NEGATIVE

## 2021-04-20 LAB — POC COVID19 BINAXNOW: SARS Coronavirus 2 Ag: NEGATIVE

## 2021-04-20 MED ORDER — PREDNISONE 20 MG PO TABS: ORAL_TABLET | ORAL | 0 refills | Status: DC

## 2021-04-20 NOTE — Patient Instructions (Signed)
Viral Respiratory Infection A respiratory infection is an illness that affects part of the respiratory system, such as the lungs, nose, or throat. A respiratory infection that is caused by a virus is called a viral respiratory infection. Common types of viral respiratory infections include: A cold. The flu (influenza). A respiratory syncytial virus (RSV) infection. What are the causes? This condition is caused by a virus. The virus may spread through contact with droplets or direct contact with infected people or their mucus or secretions. The virus may spread from person to person (is contagious). What are the signs or symptoms? Symptoms of this condition include: A stuffy or runny nose. A sore throat or cough. Shortness of breath or difficulty breathing. Yellow or green mucus (sputum). Other symptoms may include: A fever. Sweating or chills. Fatigue. Achy muscles. A headache. How is this diagnosed? This condition may be diagnosed based on: Your symptoms. A physical exam. Testing of secretions from the nose or throat. Chest X-ray. How is this treated? This condition may be treated with medicines, such as: Antiviral medicine. This may shorten the length of time a person has symptoms. Expectorants. These make it easier to cough up mucus. Decongestant nasal sprays. Acetaminophen or NSAIDs, such as ibuprofen, to relieve fever and pain. Antibiotic medicines are not prescribed for viral infections.This is because antibiotics are designed to kill bacteria. They do not kill viruses. Follow these instructions at home: Managing pain and congestion Take over-the-counter and prescription medicines only as told by your health care provider. If you have a sore throat, gargle with a mixture of salt and water 3-4 times a day or as needed. To make salt water, completely dissolve -1 tsp (3-6 g) of salt in 1 cup (237 mL) of warm water. Use nose drops made from salt water to ease congestion and  soften raw skin around your nose. Take 2 tsp (10 mL) of honey at bedtime to lessen coughing at night. Do not give honey to children who are younger than 1 year. Drink enough fluid to keep your urine pale yellow. This helps prevent dehydration and helps loosen up mucus. General instructions  Rest as much as possible. Do not drink alcohol. Do not use any products that contain nicotine or tobacco. These products include cigarettes, chewing tobacco, and vaping devices, such as e-cigarettes. If you need help quitting, ask your health care provider. Keep all follow-up visits. This is important. How is this prevented?   Get an annual flu shot. You may get the flu shot in late summer, fall, or winter. Ask your health care provider when you should get your flu shot. Avoid spreading your infection to other people. If you are sick: Wash your hands with soap and water often, especially after you cough or sneeze. Wash for at least 20 seconds. If soap and water are not available, use alcohol-based hand sanitizer. Cover your mouth when you cough. Cover your nose and mouth when you sneeze. Do not share cups or eating utensils. Clean commonly used objects often. Clean commonly touched surfaces. Stay home from work or school as told by your health care provider. Avoid contact with people who are sick during cold and flu season. This is generally fall and winter. Contact a health care provider if: Your symptoms last for 10 days or longer. Your symptoms get worse over time. You have severe sinus pain in your face or forehead. The glands in your jaw or neck become very swollen. You have shortness of breath. Get help right   away if you: Feel pain or pressure in your chest. Have trouble breathing. Faint or feel like you will faint. Have severe and persistent vomiting. Feel confused or disoriented. These symptoms may represent a serious problem that is an emergency. Do not wait to see if the symptoms will go  away. Get medical help right away. Call your local emergency services (911 in the [place] Do not drive yourself to the hospital. Summary A respiratory infection is an illness that affects part of the respiratory system, such as the lungs, nose, or throat. A respiratory infection that is caused by a virus is called a viral respiratory infection. Common types of viral respiratory infections include a cold, influenza, and respiratory syncytial virus (RSV) infection. Symptoms of this condition include a stuffy or runny nose, cough, fatigue, achy muscles, sore throat, and fevers or chills. Antibiotic medicines are not prescribed for viral infections. This is because antibiotics are designed to kill bacteria. They are not effective against viruses. This information is not intended to replace advice given to you by your health care provider. Make sure you discuss any questions you have with your health care provider. Document Revised: 04/29/2020 Document Reviewed: 04/29/2020 Elsevier Patient Education  2022 [employer] Inc.  

## 2021-04-21 ENCOUNTER — Encounter: Payer: Self-pay | Admitting: Nurse Practitioner

## 2021-04-24 ENCOUNTER — Encounter: Payer: Self-pay | Admitting: Internal Medicine

## 2021-04-24 NOTE — Patient Instructions (Signed)
Due to recent changes in healthcare laws, you may see the results of your imaging and laboratory studies on MyChart before your provider has had a chance to review them.  We understand that in some cases there may be results that are confusing or concerning to you. Not all laboratory results come back in the same time frame and the provider may be waiting for multiple results in order to interpret others.  Please give [place] 48 hours in order for your provider to thoroughly review all the results before contacting the office for clarification of your results.  ? ?+++++++++++++++++++++++++++++++ ? Vit D  & ?Vit C 1,000 mg   ?are recommended to help protect  ?against the Covid-19 and other Corona viruses.  ? ? Also it's recommended  ?to take  ?Zinc 50 mg  ?to help  ?protect against the Covid-19   ?and best place to get ? is also on [employer]  ?and don't pay more than 6-8 cents /pill !  ?================================ ?Coronavirus (COVID-19) Are you at risk? ? ?Are you at risk for the Coronavirus (COVID-19)? ? ?To be considered HIGH RISK for Coronavirus (COVID-19), you have to meet the following criteria: ? ?Traveled to [place], [place], [place], [place] or [place]; or in the [place] to [place], [place], [place]  ?or [place]; and have fever, cough, and shortness of breath within the last 2 weeks of travel OR ?Been in close contact with a person diagnosed with COVID-19 within the last 2 weeks and have  ?fever, cough,and shortness of breath ? ?IF YOU DO NOT MEET THESE CRITERIA, YOU ARE CONSIDERED LOW RISK FOR COVID-19. ? ?What to do if you are HIGH RISK for COVID-19? ? ?If you are having a medical emergency, call 911. ?Seek medical care right away. Before you go to a doctor?s office, urgent care or emergency department, ? call ahead and tell them about your recent travel, contact with someone diagnosed with COVID-19  ? and your symptoms.  ?You should receive instructions from your physician?s office regarding  next steps of care.  ?When you arrive at healthcare provider, tell the healthcare staff immediately you have returned from  ?visiting [place], [place], [place], [place] or [place]; or traveled in the [place] to [place], [place],  ?[place] or [place] in the last two weeks or you have been in close contact with a person diagnosed with  ?COVID-19 in the last 2 weeks.   ?Tell the health care staff about your symptoms: fever, cough and shortness of breath. ?After you have been seen by a medical provider, you will be either: ?Tested for (COVID-19) and discharged home on quarantine except to seek medical care if  ?symptoms worsen, and asked to  ?Stay home and avoid contact with others until you get your results (4-5 days)  ?Avoid travel on public transportation if possible (such as bus, train, or airplane) or ?Sent to the Emergency Department by EMS for evaluation, COVID-19 testing  and  ?possible admission depending on your condition and test results. ? ?What to do if you are LOW RISK for COVID-19? ? ?Reduce your risk of any infection by using the same precautions used for avoiding the common cold or flu:  ?Wash your hands often with soap and warm water for at least 20 seconds.  If soap and water are not readily available,  ?use an alcohol-based hand sanitizer with at least 60% alcohol.  ?If coughing or sneezing, cover your mouth and nose by coughing  or sneezing into the elbow areas of your shirt or coat, ? into a tissue or into your sleeve (not your hands). ?Avoid shaking hands with others and consider head nods or verbal greetings only. ?Avoid touching your eyes, nose, or mouth with unwashed hands.  ?Avoid close contact with people who are sick. ?Avoid places or events with large numbers of people in one location, like concerts or sporting events. ?Carefully consider travel plans you have or are making. ?If you are planning any travel outside or inside the [place], visit the CDC?s Travelers? Health webpage for  the latest health notices. ?If you have some symptoms but not all symptoms, continue to monitor at home and seek medical attention  ?if your symptoms worsen. ?If you are having a medical emergency, call 911. ?>>>>>>>>>>>>>>>>>>>>>>>>>>>>>>>>>>>>>>>>>>>>>>>>>>> ?We Do NOT Approve of [employer] ?> > > > > > > > > > > > > > > > > > > > > > > > > > > > > > > > > > >  > >  ? ? ?Preventive Care for Adults ? ?A healthy lifestyle and preventive care can promote health and wellness. Preventive health guidelines for men include the following key practices: ?A routine yearly physical is a good way to check with your health care provider about your health and preventative screening. It is a chance to share any concerns and updates on your health and to receive a thorough exam. ?Visit your dentist for a routine exam and preventative care every 6 months. Brush your teeth twice a day and floss once a day. Good oral hygiene prevents tooth decay and gum disease. ?The frequency of eye exams is based on your age, health, family medical history, use of contact lenses, and other factors. Follow your health care provider's recommendations for frequency of eye exams. ?Eat a healthy diet. Foods such as vegetables, fruits, whole grains, low-fat dairy products, and lean protein foods contain the nutrients you need without too many calories. Decrease your intake of foods high in solid fats, added sugars, and salt. Eat the right amount of calories for you. Get information about a proper diet from your health care provider, if necessary. ?Regular physical exercise is one of the most important things you can do for your health. Most adults should get at least 150 minutes of moderate-intensity exercise (any activity that increases your heart rate and causes you to sweat) each week. In addition, most adults need muscle-strengthening exercises on 2 or more days a week. ?Maintain a healthy weight. The body mass index (BMI) is a screening  tool to identify possible weight problems. It provides an estimate of body fat based on height and weight. Your health care provider can find your BMI and can help you achieve or maintain a healthy weight. For adults 20 years and older: ?A BMI below 18.5 is considered underweight. ?A BMI of 18.5 to 24.9 is normal. ?A BMI of 25 to 29.9 is considered overweight. ?A BMI of 30 and above is considered obese. ?Maintain normal blood lipids and cholesterol levels by exercising and minimizing your intake of saturated fat. Eat a balanced diet with plenty of fruit and vegetables. Blood tests for lipids and cholesterol should begin at age 52 and be repeated every 5 years. If your lipid or cholesterol levels are high, you are over 50, or you are at high risk for heart disease, you may need your cholesterol levels checked more frequently. Ongoing high lipid and cholesterol levels should be  treated with medicines if diet and exercise are not working. ?If you smoke, find out from your health care provider how to quit. If you do not use tobacco, do not start. ?Lung cancer screening is recommended for adults aged 69-80 years who are at high risk for developing lung cancer because of a history of smoking. A yearly low-dose CT scan of the lungs is recommended for people who have at least a 30-pack-year history of smoking and are a current smoker or have quit within the past 15 years. A pack year of smoking is smoking an average of 1 pack of cigarettes a day for 1 year (for example: 1 pack a day for 30 years or 2 packs a day for 15 years). Yearly screening should continue until the smoker has stopped smoking for at least 15 years. Yearly screening should be stopped for people who develop a health problem that would prevent them from having lung cancer treatment. ?If you choose to drink alcohol, do not have more than 2 drinks per day. One drink is considered to be 12 ounces (355 mL) of beer, 5 ounces (148 mL) of wine, or 1.5 ounces (44  mL) of liquor. ?Avoid use of street drugs. Do not share needles with anyone. Ask for help if you need support or instructions about stopping the use of drugs. ?High blood pressure causes heart disease and

## 2021-04-24 NOTE — Progress Notes (Signed)
? ? ? ?Annual  Screening/Preventative Visit  ?& Comprehensive Evaluation & Examination ? ?Future Appointments  ?Date Time Provider Department  ?04/25/2021 10:00 AM [name], MD [facility]  ?06/14/2021  7:30 AM Hayden Pedro, MD [facility]  ?10/21/2021 11:00 AM Magda Bernheim, NP [facility]  ?05/01/2022 10:00 AM [name], MD [facility]  ? ?    ?     This very nice 85 y.o. single WM presents for a Screening /Preventative Visit & comprehensive evaluation and management of multiple medical co-morbidities.  Patient has been followed for HTN, HLD, Prediabetes and Vitamin D Deficiency. Patient has COPD as defined on prior CXRs & Chest CT scans. Abd CT scan in Feb 2020 revealed Aortic Atherosclerosis. Patient has hx/o Lung , Bladder & Prostate Cancers.  ? ?    ?    Patient was seen 1 week ago with Bronchitis & treated with Prednisone. He persist with cough & sputum has become purulent.  ? ? ?    HTN predates since 1998. Patient's BP has been controlled at home.  Today's BP is at goal - 128/76. Patient denies any cardiac symptoms as chest pain, palpitations, shortness of breath, dizziness or ankle swelling. ? ? ?    Patient's hyperlipidemia is controlled with diet and medications. Patient denies myalgias or other medication SE's. Last lipids were at goal : ? ?Lab Results  ?Component Value Date  ? CHOL 154 10/28/2020  ? HDL 50 10/28/2020  ? [place] 85 10/28/2020  ? TRIG 96 10/28/2020  ? CHOLHDL 3.1 10/28/2020  ? ? ? ?    Patient has hx/o prediabetes (A1c = 6.2%  /2011)  and patient denies reactive hypoglycemic symptoms, visual blurring, diabetic polys or paresthesias. Last A1c was normal & at goal : ?  ?Lab Results  ?Component Value Date  ? HGBA1C 5.5 10/28/2020  ?  ? ? ?    Finally, patient has history of Vitamin D Deficiency  ("20" /2009) and last vitamin D was at goal : ?  ?Lab Results  ?Component Value Date  ? VD25OH 75 10/28/2020  ? ? ? ?Current Outpatient Medications on File Prior to Visit  ?Medication Sig  ?  Amitriptyline 50 MG tablet Take 1 tablet  at bedtime as needed for sleep.  ? aspirin 81 MG tablet Take 8 daily.  ? VITAMIN D 2000 UNITS  Take  4,000 Units daily.  ? Multiple Vitamin Take 1 tablet by mouth every evening.   ? Omega-3 FISH OIL 1000 MG C Take 1 capsule  2 (two) times a week.  ? rosuvastatin  10 MG tablet Take  1 tablet  every other day   ? vitamin C  500 MG tablet Take 5 daily.  ? zinc 50 MG tablet Take daily.  ? ? ? ?Allergies  ?Allergen Reactions  ? Nitrofuran Derivatives Shortness Of Breath  ?  macrobid  ? ? ? ?Past Medical History:  ?Diagnosis Date  ? Allergy   ? seasonal  ? Bladder tumor   ? Cancer [facility])   ? bladder,, lung  ? COPD (chronic obstructive pulmonary disease) ([place])   ? Diverticulosis of sigmoid colon   ? 2016  ? History of kidney stones   ? History of lung cancer 2008  S/P RIGHT UPPER LOPECTOMY   ? STAGE I  NON-SMALL CELL LUNG CANCER--  ONCOLOGIST- DR MOHAMED-   NO RECURRENCE  ? Hyperlipidemia   ? Hypertension   ? Impaired hearing bilateral aids  ? Irregular heart rate   ?  per pt heart will skip a beat   ? Kidney stone 07/08/2019  ? Unspecified vitamin D deficiency   ? ? ? ?Health Maintenance  ?Topic Date Due  ? Zoster Vaccines- Shingrix (1 of 2) Never done  ? Pneumonia Vaccine 45+ Years old (2 - PPSV23 if available, else PCV20) 01/21/2015  ? TETANUS/TDAP  10/28/2021 (Originally 03/09/2020)  ? INFLUENZA VACCINE  Completed  ? COVID-19 Vaccine  Completed  ? ? ? ?Immunization History  ?Administered Date(s) Administered  ? Influenza, High Dose  11/02/2016, 11/28/2017, 11/30/2018, 10/28/2020  ? Influenza,inj,Quad  11/09/2015  ? Influenza-Unspecified 11/06/2012  ? PFIZER SARS-COV-2 Vacc 02/27/2019, 03/19/2019, 01/09/2020  ? Public house manager Booster  12/27/2020  ? Pneumococcal -13 01/20/2014  ? Pneumococcal-23 02/06/2002, 09/23/2012  ? Tdap 03/09/2010  ? Zoster, Live 08/03/2014  ? ? ?Last Colon - 11.04.2020 - Dr Havery Moros - Recc no f/u due to age ? ?Past Surgical History:  ?Procedure  Laterality Date  ? COLONOSCOPY  05-09-2004  ? HPP and tics Deatra Ina   ? EXTRACORPOREAL SHOCK WAVE LITHOTRIPSY Left 04/18/2018  ? Procedure: EXTRACORPOREAL SHOCK WAVE LITHOTRIPSY (ESWL);  Surgeon: Festus Aloe, MD;  Location: [facility];  Service: Urology;  Laterality: Left;  ? FIBEROPTIC BRONCHOSCOPY  10-28-2008  ? LITHOTRIPSY  2012  ? LUNG REMOVAL, PARTIAL  2008  ? PROSTATE SURGERY    ? prostatectomy  ? RIGHT UPPER LUNG LOBECTOMY  09-21-2006  DR Arlyce Dice  ? NON-SMALL CELL CANCER/ SEVERE COPD  ? TRANSURETHRAL RESECTION OF BLADDER TUMOR  12/25/2011  ? Procedure: TRANSURETHRAL RESECTION OF BLADDER TUMOR (TURBT);  Surgeon: Franchot Gallo, MD;  Location[facility];  Service: Urology;  Laterality: N/A;  ? TRANSURETHRAL RESECTION OF PROSTATE  12/25/2011  ? Procedure: TRANSURETHRAL RESECTION OF THE PROSTATE WITH GYRUS INSTRUMENTS;  Surgeon: Franchot Gallo, MD;  Location[facility];  Service: Urology;  Laterality: N/A;  2 HRS ?  ? ? ? ?Family History  ?Problem Relation Age of Onset  ? Cancer Mother   ?     Non-Hodgkin's Lymphoma  ? Diabetes Mother   ? Hypertension Mother   ? Colon polyps Mother   ? Cancer Father   ?     Colon cancer w/ metastasis to lung & bone  ? Colon cancer Father   ? Cancer Brother 58  ?     Lung cancer  ? Diabetes Maternal Grandmother   ? Heart failure Maternal Grandmother   ? Cancer Brother 59  ?     Non-small cell Lung cancer  ? Pneumonia Maternal Grandfather   ? Esophageal cancer Neg Hx   ? Rectal cancer Neg Hx   ? Stomach cancer Neg Hx   ? ? ? ?Social History  ? ?Tobacco Use  ? Smoking status: Former  ?  Packs/day: 1.00  ?  Years: 35.00  ?  Pack years: 35.00  ?  Types: Cigarettes  ?  Quit date: 02/07/2004  ?  Years since quitting: 17.2  ? Smokeless tobacco: Never  ?Substance Use Topics  ? Alcohol use: Yes  ?  Alcohol/week: 0.0 standard drinks  ?  Comment: socially  ? Drug use: No  ? ? ? ? ROS ?Constitutional: Denies fever, chills, weight loss/gain, headaches,  insomnia,  night sweats or change in appetite. Does c/o fatigue. ?Eyes: Denies redness, blurred vision, diplopia, discharge, itchy or watery eyes.  ?ENT: Denies discharge, congestion, post nasal drip, epistaxis, sore throat, earache, hearing loss, dental pain, Tinnitus, Vertigo, Sinus pain or snoring.  ?  Cardio: Denies chest pain, palpitations, irregular heartbeat, syncope, dyspnea, diaphoresis, orthopnea, PND, claudication or edema ?Respiratory: denies cough, dyspnea, DOE, pleurisy, hoarseness, laryngitis or wheezing.  ?Gastrointestinal: Denies dysphagia, heartburn, reflux, water brash, pain, cramps, nausea, vomiting, bloating, diarrhea, constipation, hematemesis, melena, hematochezia, jaundice or hemorrhoids ?Genitourinary: Denies dysuria, frequency, discharge, hematuria or flank pain. Has urgency, nocturia x 2-3 & occasional hesitancy. ?Musculoskeletal: Denies arthralgia, myalgia, stiffness, Jt. Swelling, pain, limp or strain/sprain. Denies Falls. ?Skin: Denies puritis, rash, hives, warts, acne, eczema or change in skin lesion ?Neuro: No weakness, tremor, incoordination, spasms, paresthesia or pain ?Psychiatric: Denies confusion, memory loss or sensory loss. Denies Depression. ?Endocrine: Denies change in weight, skin, hair change, nocturia, and paresthesia, diabetic polys, visual blurring or hyper / hypo glycemic episodes.  ?Heme/Lymph: No excessive bleeding, bruising or enlarged lymph nodes. ? ? ?Physical Exam ? ?BP 128/76   Pulse 75   Temp 97.9 ?F (36.6 ?C)   Resp 16   Ht 5' 4.5" (1.638 m)   Wt 157 lb 12.8 oz (71.6 kg)   SpO2 98%   BMI 26.67 kg/m?  ? ?General Appearance: Well nourished and well groomed and in no apparent distress. ? ?Congested cough. No stridor.  ? ?Eyes: PERRLA, EOMs, conjunctiva no swelling or erythema, normal fundi and vessels. ?Sinuses: No frontal/maxillary tenderness ?ENT/Mouth: EACs patent / TMs  nl. Nares clear without erythema, swelling, mucoid exudates. Oral hygiene is good. No  erythema, swelling, or exudate. Tongue normal, non-obstructing. Tonsils not swollen or erythematous. Hearing normal.  ?Neck: Supple, thyroid not palpable. No bruits, nodes or JVD. ?Respiratory: Respiratory effort n

## 2021-04-25 ENCOUNTER — Encounter: Payer: Self-pay | Admitting: Internal Medicine

## 2021-04-25 ENCOUNTER — Ambulatory Visit (INDEPENDENT_AMBULATORY_CARE_PROVIDER_SITE_OTHER): Payer: PPO | Admitting: Internal Medicine

## 2021-04-25 ENCOUNTER — Ambulatory Visit
Admission: RE | Admit: 2021-04-25 | Discharge: 2021-04-25 | Disposition: A | Discharge status: Home / Self Care | Payer: PPO | Source: Ambulatory Visit | Attending: Internal Medicine | Admitting: Internal Medicine

## 2021-04-25 ENCOUNTER — Other Ambulatory Visit: Payer: Self-pay

## 2021-04-25 VITALS — BP 128/76 | HR 75 | Temp 97.9°F | Resp 16 | Ht 64.5 in | Wt 157.8 lb

## 2021-04-25 DIAGNOSIS — Z85118 Personal history of other malignant neoplasm of bronchus and lung: Secondary | ICD-10-CM

## 2021-04-25 DIAGNOSIS — I700 Atherosclerosis of aorta: Secondary | ICD-10-CM | POA: Diagnosis not present

## 2021-04-25 DIAGNOSIS — N138 Other obstructive and reflux uropathy: Secondary | ICD-10-CM

## 2021-04-25 DIAGNOSIS — Z23 Encounter for immunization: Secondary | ICD-10-CM

## 2021-04-25 DIAGNOSIS — J209 Acute bronchitis, unspecified: Secondary | ICD-10-CM

## 2021-04-25 DIAGNOSIS — J449 Chronic obstructive pulmonary disease, unspecified: Secondary | ICD-10-CM

## 2021-04-25 DIAGNOSIS — Z136 Encounter for screening for cardiovascular disorders: Secondary | ICD-10-CM | POA: Diagnosis not present

## 2021-04-25 DIAGNOSIS — E559 Vitamin D deficiency, unspecified: Secondary | ICD-10-CM | POA: Diagnosis not present

## 2021-04-25 DIAGNOSIS — Z0000 Encounter for general adult medical examination without abnormal findings: Secondary | ICD-10-CM

## 2021-04-25 DIAGNOSIS — I10 Essential (primary) hypertension: Secondary | ICD-10-CM

## 2021-04-25 DIAGNOSIS — E782 Mixed hyperlipidemia: Secondary | ICD-10-CM

## 2021-04-25 DIAGNOSIS — Z8546 Personal history of malignant neoplasm of prostate: Secondary | ICD-10-CM

## 2021-04-25 DIAGNOSIS — Z8249 Family history of ischemic heart disease and other diseases of the circulatory system: Secondary | ICD-10-CM | POA: Diagnosis not present

## 2021-04-25 DIAGNOSIS — Z0001 Encounter for general adult medical examination with abnormal findings: Secondary | ICD-10-CM

## 2021-04-25 DIAGNOSIS — Z1211 Encounter for screening for malignant neoplasm of colon: Secondary | ICD-10-CM

## 2021-04-25 DIAGNOSIS — Z87891 Personal history of nicotine dependence: Secondary | ICD-10-CM

## 2021-04-25 DIAGNOSIS — Z79899 Other long term (current) drug therapy: Secondary | ICD-10-CM | POA: Diagnosis not present

## 2021-04-25 DIAGNOSIS — N401 Enlarged prostate with lower urinary tract symptoms: Secondary | ICD-10-CM

## 2021-04-25 DIAGNOSIS — R7309 Other abnormal glucose: Secondary | ICD-10-CM

## 2021-04-25 MED ORDER — PROMETHAZINE-DM 6.25-15 MG/5ML PO SYRP: ORAL_SOLUTION | ORAL | 1 refills | Status: DC

## 2021-04-25 MED ORDER — AZITHROMYCIN 250 MG PO TABS: ORAL_TABLET | ORAL | 1 refills | Status: DC

## 2021-04-25 MED ORDER — DEXAMETHASONE 4 MG PO TABS: ORAL_TABLET | ORAL | 0 refills | Status: DC

## 2021-04-26 LAB — COMPLETE METABOLIC PANEL WITH GFR
AG Ratio: 1.7 (calc) (ref 1.0–2.5)
ALT: 43 U/L (ref 9–46)
AST: 21 U/L (ref 10–35)
Albumin: 3.8 g/dL (ref 3.6–5.1)
Alkaline phosphatase (APISO): 72 U/L (ref 35–144)
BUN: 16 mg/dL (ref 7–25)
CO2: 31 mmol/L (ref 20–32)
Calcium: 9.5 mg/dL (ref 8.6–10.3)
Chloride: 104 mmol/L (ref 98–110)
Creat: 0.92 mg/dL (ref 0.70–1.22)
Globulin: 2.3 g/dL (calc) (ref 1.9–3.7)
Glucose, Bld: 100 mg/dL — ABNORMAL HIGH (ref 65–99)
Potassium: 4.2 mmol/L (ref 3.5–5.3)
Sodium: 142 mmol/L (ref 135–146)
Total Bilirubin: 0.4 mg/dL (ref 0.2–1.2)
Total Protein: 6.1 g/dL (ref 6.1–8.1)
eGFR: 83 mL/min/{1.73_m2} (ref >=60)

## 2021-04-26 LAB — URINALYSIS, ROUTINE W REFLEX MICROSCOPIC
Bilirubin Urine: NEGATIVE
Glucose, UA: NEGATIVE
Hgb urine dipstick: NEGATIVE
Ketones, ur: NEGATIVE
Leukocytes,Ua: NEGATIVE
Nitrite: NEGATIVE
Protein, ur: NEGATIVE
Specific Gravity, Urine: 1.019 (ref 1.001–1.035)
pH: 6.5 (ref 5.0–8.0)

## 2021-04-26 LAB — CBC WITH DIFFERENTIAL/PLATELET
Absolute Monocytes: 926 cells/uL (ref 200–950)
Basophils Absolute: 107 cells/uL (ref 0–200)
Basophils Relative: 0.6 %
Eosinophils Absolute: 18 cells/uL (ref 15–500)
Eosinophils Relative: 0.1 %
HCT: 41.3 % (ref 38.5–50.0)
Hemoglobin: 13.8 g/dL (ref 13.2–17.1)
Lymphs Abs: 5838 cells/uL — ABNORMAL HIGH (ref 850–3900)
MCH: 31.6 pg (ref 27.0–33.0)
MCHC: 33.4 g/dL (ref 32.0–36.0)
MCV: 94.5 fL (ref 80.0–100.0)
MPV: 10.4 fL (ref 7.5–12.5)
Monocytes Relative: 5.2 %
Neutro Abs: 10911 cells/uL — ABNORMAL HIGH (ref 1500–7800)
Neutrophils Relative %: 61.3 %
Platelets: 325 10*3/uL (ref 140–400)
RBC: 4.37 10*6/uL (ref 4.20–5.80)
RDW: 12 % (ref 11.0–15.0)
Total Lymphocyte: 32.8 %
WBC: 17.8 10*3/uL — ABNORMAL HIGH (ref 3.8–10.8)

## 2021-04-26 LAB — LIPID PANEL
Cholesterol: 134 mg/dL (ref <200)
HDL: 32 mg/dL — ABNORMAL LOW (ref >=40)
LDL Cholesterol (Calc): 82 mg/dL (calc)
Non-HDL Cholesterol (Calc): 102 mg/dL (calc) (ref <130)
Total CHOL/HDL Ratio: 4.2 (calc) (ref <5.0)
Triglycerides: 105 mg/dL (ref <150)

## 2021-04-26 LAB — PSA: PSA: 1.16 ng/mL (ref <=4.00)

## 2021-04-26 LAB — MAGNESIUM: Magnesium: 2.4 mg/dL (ref 1.5–2.5)

## 2021-04-26 LAB — INSULIN, RANDOM: Insulin: 9.5 u[IU]/mL

## 2021-04-26 LAB — MICROALBUMIN / CREATININE URINE RATIO
Creatinine, Urine: 97 mg/dL (ref 20–320)
Microalb Creat Ratio: 8 mcg/mg creat (ref <30)
Microalb, Ur: 0.8 mg/dL

## 2021-04-26 LAB — VITAMIN D 25 HYDROXY (VIT D DEFICIENCY, FRACTURES): Vit D, 25-Hydroxy: 79 ng/mL (ref 30–100)

## 2021-04-26 LAB — HEMOGLOBIN A1C
Hgb A1c MFr Bld: 5.7 % of total Hgb — ABNORMAL HIGH (ref <5.7)
Mean Plasma Glucose: 117 mg/dL
eAG (mmol/L): 6.5 mmol/L

## 2021-04-26 LAB — TSH: TSH: 0.36 mIU/L — ABNORMAL LOW (ref 0.40–4.50)

## 2021-04-26 NOTE — Progress Notes (Signed)
<><><><><><><><><><><><><><><><><><><><><><><><><><><><><><><><><> ?<><><><><><><><><><><><><><><><><><><><><><><><><><><><><><><><><> ? ?-    CXR - OK shows post surgical changes & emphysema  AND  ? ?                                                                            No Signs of Lung Cancer - Great !  ? ?<><><><><><><><><><><><><><><><><><><><><><><><><><><><><><><><><> ?<><><><><><><><><><><><><><><><><><><><><><><><><><><><><><><><><> ?

## 2021-04-26 NOTE — Progress Notes (Signed)
<><><><><><><><><><><><><><><><><><><><><><><><><><><><><><><><><> ?<><><><><><><><><><><><><><><><><><><><><><><><><><><><><><><><><> ?-   Test results slightly outside the reference range are not unusual. ?If there is anything important, I will review this with you,  ?otherwise it is considered normal test values.  ?If you have further questions,  ?please do not hesitate to contact me at the office or via My Chart.  ?<><><><><><><><><><><><><><><><><><><><><><><><><><><><><><><><><> ?<><><><><><><><><><><><><><><><><><><><><><><><><><><><><><><><><> ? ?-  CBC - OK  ? ? Shows WBC is elevated which is a side effect of taking the Prednisone - So it's OK  ?<><><><><><><><><><><><><><><><><><><><><><><><><><><><><><><><><> ?<><><><><><><><><><><><><><><><><><><><><><><><><><><><><><><><><> ? ?-  Total chol = 134   & LDL Chol = 82   -    Both  Excellent  ? ?- Very low risk for Heart Attack  / Stroke ?============================================================ ?============================================================ ? ?-  A1c = 5.7 -- Borderline high sugar = average glucose = 117 -  Not very high  ? ?- Avoid Sweets, Candy & White Stuff  ? ?- White Rice, White Potatoes, White Flour  - Breads &  Pasta ?<><><><><><><><><><><><><><><><><><><><><><><><><><><><><><><><><> ?<><><><><><><><><><><><><><><><><><><><><><><><><><><><><><><><><> ? ?-  PSA - is Normal  -   Great  ! ?<><><><><><><><><><><><><><><><><><><><><><><><><><><><><><><><><> ?<><><><><><><><><><><><><><><><><><><><><><><><><><><><><><><><><> ? ?-  <><><><><><><><><><><><><><><><><><><><><><><><><><><><><><><><><> ?<><><><><><><><><><><><><><><><><><><><><><><><><><><><><><><><><> ? ?- Vitamin D = 79 - Excellent   -   Please keep dose same  ?<><><><><><><><><><><><><><><><><><><><><><><><><><><><><><><><><> ?<><><><><><><><><><><><><><><><><><><><><><><><><><><><><><><><><> ? ?-  All Else - CBC - Kidneys - Electrolytes - Liver - Magnesium & Thyroid   ? ?-  all  Normal / OK ?<><><><><><><><><><><><><><><><><><><><><><><><><><><><><><><><><> ?<><><><><><><><><><><><><><><><><><><><><><><><><><><><><><><><><> ? ?-  Keep up the [place]   Work  ! ? ?<><><><><><><><><><><><><><><><><><><><><><><><><><><><><><><><><> ?<><><><><><><><><><><><><><><><><><><><><><><><><><><><><><><><><> ? ? ? ? ? ? ? ? ? ? ? ? ? ? ? ? ? ? ? ? ? ? ? ? ? ? ? ? ? ? ? ?

## 2021-06-14 ENCOUNTER — Telehealth: Payer: Self-pay

## 2021-06-14 ENCOUNTER — Encounter (INDEPENDENT_AMBULATORY_CARE_PROVIDER_SITE_OTHER): Payer: PPO | Admitting: Ophthalmology

## 2021-06-14 DIAGNOSIS — H43813 Vitreous degeneration, bilateral: Secondary | ICD-10-CM

## 2021-06-14 DIAGNOSIS — H35033 Hypertensive retinopathy, bilateral: Secondary | ICD-10-CM

## 2021-06-14 DIAGNOSIS — H34812 Central retinal vein occlusion, left eye, with macular edema: Secondary | ICD-10-CM | POA: Diagnosis not present

## 2021-06-14 DIAGNOSIS — I10 Essential (primary) hypertension: Secondary | ICD-10-CM | POA: Diagnosis not present

## 2021-06-14 NOTE — Telephone Encounter (Signed)
LM-06/14/21-Calling pt. To complete CCM to [facility] transfer. Unable ot reach patient. [place]. ? ?Total time spent: 2 min. ?

## 2021-06-16 NOTE — Telephone Encounter (Signed)
LM-06/16/21-Calling pt. To complete CCM to [facility] transfer.Unable to reach pt. [place] x2. ? ?Total time spent: 2 min. ?

## 2021-06-20 NOTE — Telephone Encounter (Signed)
LM-06/20/21-Calling pt. To complete CCM to [facility] transfer. Spoke to pt. And completed CCM to [facility] transfer. Informed pt. To continue following up with Dr. Melford Aase as scheduled and to reach out to [place] if any new health concerns arise. ? ?Total time spent: 2 min. ?

## 2021-07-19 ENCOUNTER — Ambulatory Visit: Payer: PPO | Admitting: Pharmacy Technician

## 2021-07-27 ENCOUNTER — Ambulatory Visit (INDEPENDENT_AMBULATORY_CARE_PROVIDER_SITE_OTHER): Payer: PPO | Admitting: Nurse Practitioner

## 2021-07-27 ENCOUNTER — Encounter: Payer: Self-pay | Admitting: Nurse Practitioner

## 2021-07-27 VITALS — BP 121/80 | HR 70 | Temp 96.8°F | Wt 161.0 lb

## 2021-07-27 DIAGNOSIS — R7989 Other specified abnormal findings of blood chemistry: Secondary | ICD-10-CM

## 2021-07-27 DIAGNOSIS — N400 Enlarged prostate without lower urinary tract symptoms: Secondary | ICD-10-CM | POA: Diagnosis not present

## 2021-07-27 DIAGNOSIS — H900 Conductive hearing loss, bilateral: Secondary | ICD-10-CM | POA: Diagnosis not present

## 2021-07-27 DIAGNOSIS — E782 Mixed hyperlipidemia: Secondary | ICD-10-CM | POA: Diagnosis not present

## 2021-07-27 DIAGNOSIS — I700 Atherosclerosis of aorta: Secondary | ICD-10-CM | POA: Diagnosis not present

## 2021-07-27 DIAGNOSIS — E538 Deficiency of other specified B group vitamins: Secondary | ICD-10-CM

## 2021-07-27 DIAGNOSIS — I10 Essential (primary) hypertension: Secondary | ICD-10-CM

## 2021-07-27 DIAGNOSIS — D72829 Elevated white blood cell count, unspecified: Secondary | ICD-10-CM

## 2021-07-27 NOTE — Progress Notes (Unsigned)
FOLLOW UP  Assessment and Plan:   1. Leukocytosis, unspecified type ***  2. Low TSH level ***  3. Primary hypertension ***  4. Aortic atherosclerosis ([place]) by Abd  CTscan on 03/18/2018 ***  5. Conductive hearing loss, bilateral ***  6. Benign prostatic hyperplasia without lower urinary tract symptoms ***  7. Hyperlipidemia ***   Continue diet and meds as discussed. Further disposition pending results of labs. Discussed med's effects and SE's.   Over 30 minutes of exam, counseling, chart review, and critical decision making was performed.   Future Appointments  Date Time Provider [place]  07/27/2021 10:30 AM Darrol Jump, NP [facility] None  08/23/2021  7:30 AM Hayden Pedro, MD [facility] None  11/02/2021 10:30 AM Alycia Rossetti, NP [facility] None  02/02/2022 10:30 AM [name], MD [facility] None  05/04/2022 10:00 AM [name], MD [facility] None    ----------------------------------------------------------------------------------------------------------------------  HPI 86 y.o. male  presents for 3 month follow up on hypertension, cholesterol, diabetes, weight and vitamin D deficiency.   BMI is There is no height or weight on file to calculate BMI., he {HAS HAS TGG:26948} been working on diet and exercise. Wt Readings from Last 3 Encounters:  04/25/21 157 lb 12.8 oz (71.6 kg)  04/20/21 160 lb 9.6 oz (72.8 kg)  10/28/20 162 lb 3.2 oz (73.6 kg)    His blood pressure {HAS HAS NOT:18834} been controlled at home, today their BP is    He {DOES_DOES NIO:27035} workout. He denies chest pain, shortness of breath, dizziness.   He {ACTION; IS/IS KKX:38182993} on cholesterol medication {Cholesterol meds:21887} and denies myalgias. His cholesterol {ACTION; IS/IS NOT:21021397} at goal. The cholesterol last visit was:   Lab Results  Component Value Date   CHOL 134 04/25/2021   HDL 32 (L) 04/25/2021   LDLCALC 82 04/25/2021   TRIG 105  04/25/2021   CHOLHDL 4.2 04/25/2021    He {Has/has not:18111} been working on diet and exercise for prediabetes, and denies {Symptoms; diabetes w/o none:19199}. Last A1C in the office was:  Lab Results  Component Value Date   HGBA1C 5.7 (H) 04/25/2021   Patient is on Vitamin D supplement.   Lab Results  Component Value Date   VD25OH 79 04/25/2021        Current Medications:  Current Outpatient Medications on File Prior to Visit  Medication Sig   aspirin 81 MG tablet Take 81 mg by mouth daily.   azithromycin (ZITHROMAX) 250 MG tablet Take 2 tablets with Food on  Day 1, then 1 tablet Daily with Food for Sinusitis / Bronchitis   Cholecalciferol (VITAMIN D) 2000 UNITS CAPS Take 4,000 Units by mouth daily.   dexamethasone (DECADRON) 4 MG tablet Take 1 tab 3 x day - 3 days, then 2 x day - 3 days, then 1 tab daily   Multiple Vitamin (MULTIVITAMIN WITH MINERALS) TABS Take 1 tablet by mouth every evening.    Omega-3 Fatty Acids (FISH OIL) 1000 MG CAPS Take 1 capsule by mouth 2 (two) times a week.   promethazine-dextromethorphan (PROMETHAZINE-DM) 6.25-15 MG/5ML syrup Take 1 tsp every 4 hours if needed for cough   rosuvastatin (CRESTOR) 10 MG tablet Take  1 tablet  every other day (on even days)  for Cholesterol / Patient knows to take by mouth   vitamin C (ASCORBIC ACID) 500 MG tablet Take 500 mg by mouth daily.   zinc gluconate 50 MG tablet Take 50 mg by mouth daily.   No current facility-administered medications on  file prior to visit.     Allergies:  Allergies  Allergen Reactions   Nitrofuran Derivatives Shortness Of Breath    macrobid     Medical History:  Past Medical History:  Diagnosis Date   Allergy    seasonal   Bladder tumor    Cancer ([place])    bladder,, lung   COPD (chronic obstructive pulmonary disease) ([place])    Diverticulosis of sigmoid colon    2016   History of kidney stones    History of lung cancer 2008  S/P RIGHT UPPER LOPECTOMY    STAGE I  NON-SMALL CELL  LUNG CANCER--  ONCOLOGIST- DR Julien Nordmann-   NO RECURRENCE   Hyperlipidemia    Hypertension    Impaired hearing bilateral aids   Irregular heart rate    per pt heart will skip a beat    Kidney stone 07/08/2019   Unspecified vitamin D deficiency    Family history- Reviewed and unchanged Social history- Reviewed and unchanged   Review of Systems:  ROS    Physical Exam: There were no vitals taken for this visit. Wt Readings from Last 3 Encounters:  04/25/21 157 lb 12.8 oz (71.6 kg)  04/20/21 160 lb 9.6 oz (72.8 kg)  10/28/20 162 lb 3.2 oz (73.6 kg)   General Appearance: Well nourished, in no apparent distress. Eyes: PERRLA, EOMs, conjunctiva no swelling or erythema Sinuses: No Frontal/maxillary tenderness ENT/Mouth: Ext aud canals clear, TMs without erythema, bulging. No erythema, swelling, or exudate on post pharynx.  Tonsils not swollen or erythematous. Hearing normal.  Neck: Supple, thyroid normal.  Respiratory: Respiratory effort normal, BS equal bilaterally without rales, rhonchi, wheezing or stridor.  Cardio: RRR with no MRGs. Brisk peripheral pulses without edema.  Abdomen: Soft, + BS.  Non tender, no guarding, rebound, hernias, masses. Lymphatics: Non tender without lymphadenopathy.  Musculoskeletal: Full ROM, 5/5 strength, {PSY - GAIT AND STATION:22860} gait Skin: Warm, dry without rashes, lesions, ecchymosis.  Neuro: Cranial nerves intact. No cerebellar symptoms.  Psych: Awake and oriented X 3, normal affect, Insight and Judgment appropriate.    Darrol Jump, NP 10:24 AM [facility]

## 2021-07-27 NOTE — Patient Instructions (Signed)
Fatigue If you have fatigue, you feel tired all the time and have a lack of energy or a lack of motivation. Fatigue may make it difficult to start or complete tasks because of exhaustion. Occasional or mild fatigue is often a normal response to activity or life. However, long-term (chronic) or extreme fatigue may be a symptom of a medical condition such as: Depression. Not having enough red blood cells or hemoglobin in the blood (anemia). A problem with a small gland located in the lower front part of the neck (thyroid disorder). Rheumatologic conditions. These are problems related to the body's defense system (immune system). Infections, especially certain viral infections. Fatigue can also lead to negative health outcomes over time. Follow these instructions at home: Medicines Take over-the-counter and prescription medicines only as told by your health care provider. Take a multivitamin if told by your health care provider. Do not use herbal or dietary supplements unless they are approved by your health care provider. Eating and drinking  Avoid heavy meals in the evening. Eat a well-balanced diet, which includes lean proteins, whole grains, plenty of fruits and vegetables, and low-fat dairy products. Avoid eating or drinking too many products with caffeine in them. Avoid alcohol. Drink enough fluid to keep your urine pale yellow. Activity  Exercise regularly, as told by your health care provider. Use or practice techniques to help you relax, such as yoga, tai chi, meditation, or massage therapy. Lifestyle Change situations that cause you stress. Try to keep your work and personal schedules in balance. Do not use recreational or illegal drugs. General instructions Monitor your fatigue for any changes. Go to bed and get up at the same time every day. Avoid fatigue by pacing yourself during the day and getting enough sleep at night. Maintain a healthy weight. Contact a health care  provider if: Your fatigue does not get better. You have a fever. You suddenly lose or gain weight. You have headaches. You have trouble falling asleep or sleeping through the night. You feel angry, guilty, anxious, or sad. You have swelling in your legs or another part of your body. Get help right away if: You feel confused, feel like you might faint, or faint. Your vision is blurry or you have a severe headache. You have severe pain in your abdomen, your back, or the area between your waist and hips (pelvis). You have chest pain, shortness of breath, or an irregular or fast heartbeat. You are unable to urinate, or you urinate less than normal. You have abnormal bleeding from the rectum, nose, lungs, nipples, or, if you are male, the vagina. You vomit blood. You have thoughts about hurting yourself or others. These symptoms may be an emergency. Get help right away. Call 911. Do not wait to see if the symptoms will go away. Do not drive yourself to the hospital. Get help right away if you feel like you may hurt yourself or others, or have thoughts about taking your own life. Go to your nearest emergency room or: Call 911. Call the [facility] at [phone] or 988. This is open 24 hours a day. Text the Crisis Text Line at 979-823-0385. Summary If you have fatigue, you feel tired all the time and have a lack of energy or a lack of motivation. Fatigue may make it difficult to start or complete tasks because of exhaustion. Long-term (chronic) or extreme fatigue may be a symptom of a medical condition. Exercise regularly, as told by your health care provider.  Change situations that cause you stress. Try to keep your work and personal schedules in balance. This information is not intended to replace advice given to you by your health care provider. Make sure you discuss any questions you have with your health care provider. Document Revised: 11/15/2020 Document  Reviewed: 11/15/2020 Elsevier Patient Education  2023 Elsevier Inc.  

## 2021-07-28 LAB — CBC WITH DIFFERENTIAL/PLATELET
Absolute Monocytes: 662 cells/uL (ref 200–950)
Basophils Absolute: 83 cells/uL (ref 0–200)
Basophils Relative: 0.9 %
Eosinophils Absolute: 138 cells/uL (ref 15–500)
Eosinophils Relative: 1.5 %
HCT: 41.5 % (ref 38.5–50.0)
Hemoglobin: 14.3 g/dL (ref 13.2–17.1)
Lymphs Abs: 3708 cells/uL (ref 850–3900)
MCH: 32.3 pg (ref 27.0–33.0)
MCHC: 34.5 g/dL (ref 32.0–36.0)
MCV: 93.7 fL (ref 80.0–100.0)
MPV: 10.6 fL (ref 7.5–12.5)
Monocytes Relative: 7.2 %
Neutro Abs: 4609 cells/uL (ref 1500–7800)
Neutrophils Relative %: 50.1 %
Platelets: 223 10*3/uL (ref 140–400)
RBC: 4.43 10*6/uL (ref 4.20–5.80)
RDW: 12.6 % (ref 11.0–15.0)
Total Lymphocyte: 40.3 %
WBC: 9.2 10*3/uL (ref 3.8–10.8)

## 2021-07-28 LAB — COMPLETE METABOLIC PANEL WITH GFR
AG Ratio: 2 (calc) (ref 1.0–2.5)
ALT: 18 U/L (ref 9–46)
AST: 25 U/L (ref 10–35)
Albumin: 4.3 g/dL (ref 3.6–5.1)
Alkaline phosphatase (APISO): 76 U/L (ref 35–144)
BUN: 14 mg/dL (ref 7–25)
CO2: 27 mmol/L (ref 20–32)
Calcium: 10 mg/dL (ref 8.6–10.3)
Chloride: 105 mmol/L (ref 98–110)
Creat: 0.94 mg/dL (ref 0.70–1.22)
Globulin: 2.2 g/dL (calc) (ref 1.9–3.7)
Glucose, Bld: 87 mg/dL (ref 65–99)
Potassium: 4.6 mmol/L (ref 3.5–5.3)
Sodium: 142 mmol/L (ref 135–146)
Total Bilirubin: 0.7 mg/dL (ref 0.2–1.2)
Total Protein: 6.5 g/dL (ref 6.1–8.1)
eGFR: 80 mL/min/{1.73_m2} (ref >=60)

## 2021-07-28 LAB — VITAMIN B12: Vitamin B-12: 273 pg/mL (ref 200–1100)

## 2021-07-28 LAB — TSH: TSH: 1.15 mIU/L (ref 0.40–4.50)

## 2021-08-23 ENCOUNTER — Encounter (INDEPENDENT_AMBULATORY_CARE_PROVIDER_SITE_OTHER): Payer: PPO | Admitting: Ophthalmology

## 2021-08-23 DIAGNOSIS — H34812 Central retinal vein occlusion, left eye, with macular edema: Secondary | ICD-10-CM

## 2021-08-23 DIAGNOSIS — H43813 Vitreous degeneration, bilateral: Secondary | ICD-10-CM

## 2021-08-23 DIAGNOSIS — H35033 Hypertensive retinopathy, bilateral: Secondary | ICD-10-CM

## 2021-08-23 DIAGNOSIS — I10 Essential (primary) hypertension: Secondary | ICD-10-CM | POA: Diagnosis not present

## 2021-10-21 ENCOUNTER — Ambulatory Visit: Payer: PPO | Admitting: Nurse Practitioner

## 2021-11-01 ENCOUNTER — Encounter (INDEPENDENT_AMBULATORY_CARE_PROVIDER_SITE_OTHER): Payer: PPO | Admitting: Ophthalmology

## 2021-11-01 DIAGNOSIS — I10 Essential (primary) hypertension: Secondary | ICD-10-CM | POA: Diagnosis not present

## 2021-11-01 DIAGNOSIS — H35033 Hypertensive retinopathy, bilateral: Secondary | ICD-10-CM | POA: Diagnosis not present

## 2021-11-01 DIAGNOSIS — H34812 Central retinal vein occlusion, left eye, with macular edema: Secondary | ICD-10-CM | POA: Diagnosis not present

## 2021-11-01 DIAGNOSIS — H43813 Vitreous degeneration, bilateral: Secondary | ICD-10-CM | POA: Diagnosis not present

## 2021-11-02 ENCOUNTER — Ambulatory Visit: Payer: PPO | Admitting: Nurse Practitioner

## 2022-01-10 ENCOUNTER — Encounter (INDEPENDENT_AMBULATORY_CARE_PROVIDER_SITE_OTHER): Payer: PPO | Admitting: Ophthalmology

## 2022-01-10 DIAGNOSIS — I10 Essential (primary) hypertension: Secondary | ICD-10-CM | POA: Diagnosis not present

## 2022-01-10 DIAGNOSIS — H43813 Vitreous degeneration, bilateral: Secondary | ICD-10-CM

## 2022-01-10 DIAGNOSIS — H35033 Hypertensive retinopathy, bilateral: Secondary | ICD-10-CM

## 2022-01-10 DIAGNOSIS — H34812 Central retinal vein occlusion, left eye, with macular edema: Secondary | ICD-10-CM | POA: Diagnosis not present

## 2022-01-16 ENCOUNTER — Ambulatory Visit (INDEPENDENT_AMBULATORY_CARE_PROVIDER_SITE_OTHER): Payer: PPO | Admitting: Nurse Practitioner

## 2022-01-16 VITALS — BP 140/78 | HR 84 | Temp 97.7°F | Wt 159.0 lb

## 2022-01-16 DIAGNOSIS — G4700 Insomnia, unspecified: Secondary | ICD-10-CM

## 2022-01-16 DIAGNOSIS — E663 Overweight: Secondary | ICD-10-CM

## 2022-01-16 DIAGNOSIS — J449 Chronic obstructive pulmonary disease, unspecified: Secondary | ICD-10-CM | POA: Diagnosis not present

## 2022-01-16 DIAGNOSIS — E782 Mixed hyperlipidemia: Secondary | ICD-10-CM

## 2022-01-16 DIAGNOSIS — Z85118 Personal history of other malignant neoplasm of bronchus and lung: Secondary | ICD-10-CM

## 2022-01-16 DIAGNOSIS — R7309 Other abnormal glucose: Secondary | ICD-10-CM

## 2022-01-16 DIAGNOSIS — R6889 Other general symptoms and signs: Secondary | ICD-10-CM | POA: Diagnosis not present

## 2022-01-16 DIAGNOSIS — Z79899 Other long term (current) drug therapy: Secondary | ICD-10-CM

## 2022-01-16 DIAGNOSIS — I10 Essential (primary) hypertension: Secondary | ICD-10-CM

## 2022-01-16 DIAGNOSIS — Z8546 Personal history of malignant neoplasm of prostate: Secondary | ICD-10-CM

## 2022-01-16 DIAGNOSIS — H900 Conductive hearing loss, bilateral: Secondary | ICD-10-CM

## 2022-01-16 DIAGNOSIS — Z87891 Personal history of nicotine dependence: Secondary | ICD-10-CM | POA: Diagnosis not present

## 2022-01-16 DIAGNOSIS — R7989 Other specified abnormal findings of blood chemistry: Secondary | ICD-10-CM | POA: Diagnosis not present

## 2022-01-16 DIAGNOSIS — I700 Atherosclerosis of aorta: Secondary | ICD-10-CM

## 2022-01-16 DIAGNOSIS — Z0001 Encounter for general adult medical examination with abnormal findings: Secondary | ICD-10-CM | POA: Diagnosis not present

## 2022-01-16 DIAGNOSIS — Z0000 Encounter for general adult medical examination without abnormal findings: Secondary | ICD-10-CM

## 2022-01-16 NOTE — Progress Notes (Signed)
MEDICARE ANNUAL WELLNESS VISIT AND FOLLOW UP Assessment:   Diagnoses and all orders for this visit:  Encounter for Medicare annual wellness exam Due annually  Health Maintenance Reviewed and discussed   Aortic atherosclerosis ([place]) Per CT 03/2018 Control blood pressure, cholesterol, glucose, increase exercise.   COPD Currently on no medications Monitor symptoms  Essential hypertension Discussed DASH (Dietary Approaches to Stop Hypertension) DASH diet is lower in sodium than a typical American diet. Cut back on foods that are high in saturated fat, cholesterol, and trans fats. Eat more whole-grain foods, fish, poultry, and nuts Remain active and exercise as tolerated daily.  Monitor BP at home-Call if greater than 130/80.  Check CMP/CBC   Vitamin D deficiency At goal at recent check; continue to recommend supplementation for goal of 60-100 Check vitamin D level at CPE  Other abnormal glucose Education: Reviewed 'ABCs' of diabetes management  Discussed goals to be met and/or maintained include A1C (<7) Blood pressure (<130/80) Cholesterol (LDL <70) Continue Eye Exam yearly  Continue Dental Exam Q6 mo Discussed dietary recommendations Discussed Physical Activity recommendations Check A1C  Hyperlipidemia Discussed lifestyle modifications. Recommended diet heavy in fruits and veggies, omega 3's. Decrease consumption of animal meats, cheeses, and dairy products. Remain active and exercise as tolerated. Continue to monitor. Check lipids/TSH   Overweight (BMI 25.0-29.9) Discussed appropriate BMI Diet modification. Physical activity. Encouraged/praised to build confidence.   Medication management All medications discussed and reviewed in full. All questions and concerns regarding medications addressed.     History of smoking CXR 04/2019 no changes  History of lung cancer S/p VATS RUL lobectomy, 10 years without recurrence; Released by Dr. Earlie Server Following  annual CXR, UTD  Hx of bladder/prostate cancer denies urinary symptoms, followed by Dr. Diona Fanti, last visit 06/03/20  Hearing loss, bilateral Continue hearing aids  No recent changes  Insomnia Resolved since TSH level WNL. Insomnia- good sleep hygiene discussed, increase day time activity, try going to sleep at night at consistent time  Low TSH Recheck TSH today Continue to monitor  Orders Placed This Encounter  Procedures   CBC with Differential/Platelet   COMPLETE METABOLIC PANEL WITH GFR   Lipid panel   TSH   Hemoglobin A1c    Notify office for further evaluation and treatment, questions or concerns if any reported s/s fail to improve.   The patient was advised to call back or seek an in-person evaluation if any symptoms worsen or if the condition fails to improve as anticipated.   Further disposition pending results of labs. Discussed med's effects and SE's.    I discussed the assessment and treatment plan with the patient. The patient was provided an opportunity to ask questions and all were answered. The patient agreed with the plan and demonstrated an understanding of the instructions.  Discussed med's effects and SE's. Screening labs and tests as requested with regular follow-up as recommended.  I provided 30 minutes of face-to-face time during this encounter including counseling, chart review, and critical decision making was preformed.   Future Appointments  Date Time Provider [place]  02/02/2022 10:30 AM [name], MD [facility] None  03/21/2022  7:30 AM Hayden Pedro, MD [facility] None  05/04/2022 10:00 AM [name], MD [facility] None  08/07/2022  4:00 PM Darrol Jump, NP [facility] None     Plan:   During the course of the visit the patient was educated and counseled about appropriate screening and preventive services including:   Pneumococcal vaccine  Influenza vaccine Prevnar 13  Td vaccine Screening  electrocardiogram Colorectal cancer screening Diabetes screening Glaucoma screening Nutrition counseling    Subjective:  Raymond Patrick is a 86 y.o. male who presents for Medicare Annual Wellness Visit and 3 month follow up for HTN, hyperlipidemia, glucose management, and vitamin D Def.   Overall he reports feeling well.  He has not new or additional concerns in clinic today.  Patient is s/p RUL lobectomy by VATS  for Primary Lung Cancer in 2008 followed by Dr Julien Nordmann (has since been released, doing annual CXR, last in March 2021) and also is s/p TURP for Prostate Cancer & Bladder cancer in 2013 by Dr Diona Fanti, continues to follow up with him doing annual cysto, recently negative in 03/2018. Also follows with hx of kidney stones, had lithotripsy last year without problems since.   Reports some difficulty sleeping in the last year, has been having trouble falling asleep. Will fall asleep at 1 AM and wakes up at 6 am. Thought to be d/t TSH levels too low.  Now WNL and sleeping better.  Enjoying being more social, trying to get out more.   BMI is Body mass index is 26.87 kg/m., he has been working on diet and exercise.  Wt Readings from Last 3 Encounters:  01/16/22 159 lb (72.1 kg)  07/27/21 161 lb (73 kg)  04/25/21 157 lb 12.8 oz (71.6 kg)   He has abdominal aortic atherosclerosis per Ct 03/2018.  His blood pressure has been controlled at home, today their BP is BP: (!) 140/78 He does workout. He denies chest pain, shortness of breath, dizziness.   He is on cholesterol medication (rosuvastatin 10 mg every other day, he has been on pravastatin and lipitor in the past) and denies myalgias. His cholesterol is at goal. The cholesterol last visit was:   Lab Results  Component Value Date   CHOL 134 04/25/2021   HDL 32 (L) 04/25/2021   LDLCALC 82 04/25/2021   TRIG 105 04/25/2021   CHOLHDL 4.2 04/25/2021   He has been working on diet and exercise for glucose management, and denies foot  ulcerations, increased appetite, nausea, paresthesia of the feet, polydipsia, polyuria, visual disturbances and vomiting. Last A1C in the office was:  Lab Results  Component Value Date   HGBA1C 5.7 (H) 04/25/2021    Last GFR:  Lab Results  Component Value Date   [facility] 82 04/15/2020    Patient is on Vitamin D supplement and at goal at recent check:   Lab Results  Component Value Date   VD25OH 79 04/25/2021      Medication Review: Current Outpatient Medications on File Prior to Visit  Medication Sig Dispense Refill   aspirin 81 MG tablet Take 81 mg by mouth daily.     Cholecalciferol (VITAMIN D) 2000 UNITS CAPS Take 4,000 Units by mouth daily.     Multiple Vitamin (MULTIVITAMIN WITH MINERALS) TABS Take 1 tablet by mouth every evening.      Omega-3 Fatty Acids (FISH OIL) 1000 MG CAPS Take 1 capsule by mouth 2 (two) times a week.     rosuvastatin (CRESTOR) 10 MG tablet Take  1 tablet  every other day (on even days)  for Cholesterol / Patient knows to take by mouth 45 tablet 3   vitamin C (ASCORBIC ACID) 500 MG tablet Take 500 mg by mouth daily.     zinc gluconate 50 MG tablet Take 50 mg by mouth daily.     azithromycin (ZITHROMAX) 250 MG tablet Take 2  tablets with Food on  Day 1, then 1 tablet Daily with Food for Sinusitis / Bronchitis 6 each 1   dexamethasone (DECADRON) 4 MG tablet Take 1 tab 3 x day - 3 days, then 2 x day - 3 days, then 1 tab daily 20 tablet 0   promethazine-dextromethorphan (PROMETHAZINE-DM) 6.25-15 MG/5ML syrup Take 1 tsp every 4 hours if needed for cough 240 mL 1   No current facility-administered medications on file prior to visit.    Allergies: Allergies  Allergen Reactions   Nitrofuran Derivatives Shortness Of Breath    macrobid    Current Problems (verified) has Hyperlipidemia; History of smoking; Hypertension; Vitamin D deficiency; Abnormal glucose; History of lung cancer; Overweight (BMI 25.0-29.9); BPH (benign prostatic hyperplasia); Aortic  atherosclerosis ([place]) by Abd  CTscan on 03/18/2018; Bilateral hearing loss; and Insomnia on their problem list.  Screening Tests Health Maintenance  Topic Date Due   Medicare Annual Wellness (AWV)  Never done   Zoster Vaccines- Shingrix (1 of 2) Never done   DTaP/Tdap/Td (2 - Td or Tdap) 03/09/2020   COLONOSCOPY (Pts 45-74yr Insurance coverage will need to be confirmed)  12/10/2021   COVID-19 Vaccine (6 - 2023-24 season) 02/11/2022   Pneumonia Vaccine 86 Years old  Completed   INFLUENZA VACCINE  Completed   HPV VACCINES  Aged Out   Preventative care: Last colonoscopy: 12/2018, polyps, DONE per GI due to age   Prior vaccinations: TD or Tdap: 2012  Influenza: Due 2023  Pneumococcal: 2004, 2014 Prevnar13: 2015 Shingles/Zostavax: 2016 Covid 19: 2/2, 2021, pfizer   Names of Other Physician/Practitioners you currently use: 1. [facility] here for primary care 2. Dr. MElder Negus eye doctor, last visit 2021, mild Left cataract, has has L macular edema followed by Dr. MZigmund Danielq7-8 weeks, doing injections 3. [employer] dentist, last visit 07/2020  Patient Care Team: MUnk Pinto MD as PCP - General (Internal Medicine) DFranchot Gallo MD as Consulting Physician (Urology) MCurt Bears MD as Consulting Physician (Oncology) BLorretta Harp MD as Consulting Physician (Cardiology) KInda Castle MD (Inactive) as Consulting Physician (Gastroenterology) [facility]elissa Noon [facility] Referring Physician (Optometry) [facility] as Pharmacist (Pharmacist)  Surgical: He  has a past surgical history that includes Lung removal, partial (2008); Lithotripsy (2012); Fiberoptic bronchoscopy (10-28-2008); RIGHT UPPER LUNG LOBECTOMY (09-21-2006  DR BArlyce Dice; Transurethral resection of prostate (12/25/2011); Transurethral resection of bladder tumor (12/25/2011); Prostate surgery; Colonoscopy (05-09-2004); and Extracorporeal shock  wave lithotripsy (Left, 04/18/2018). Family His family history includes Cancer in his father and mother; Cancer (age of onset: 64 in his brother; Cancer (age of onset: 643 in his brother; Colon cancer in his father; Colon polyps in his mother; Diabetes in his maternal grandmother and mother; Heart failure in his maternal grandmother; Hypertension in his mother; Pneumonia in his maternal grandfather. Social history  He reports that he quit smoking about 17 years ago. His smoking use included cigarettes. He has a 35.00 pack-year smoking history. He has never used smokeless tobacco. He reports current alcohol use. He reports that he does not use drugs.  MEDICARE WELLNESS OBJECTIVES: Physical activity:   Cardiac risk factors:   Depression/mood screen:      04/24/2021   10:15 PM  Depression screen PHQ 2/9  Decreased Interest 0  Down, Depressed, Hopeless 0  PHQ - 2 Score 0    ADLs:     01/16/2022    3:44 PM 04/24/2021   10:16 PM  In your present state  of health, do you have any difficulty performing the following activities:  Hearing? 0 0  Vision? 0 0  Difficulty concentrating or making decisions? 0 0  Walking or climbing stairs? 0 0  Dressing or bathing? 0 0  Doing errands, shopping? 0 0  Preparing Food and eating ? N   Using the Toilet? N   In the past six months, have you accidently leaked urine? N   Do you have problems with loss of bowel control? N   Managing your Medications? N   Managing your Finances? N   Housekeeping or managing your Housekeeping? N      Cognitive Testing  Alert? Yes  Normal Appearance?Yes  Oriented to person? Yes  Place? Yes   Time? Yes  Recall of three objects?  Yes  Can perform simple calculations? Yes  Displays appropriate judgment?Yes  Can read the correct time from a watch face?Yes  EOL planning:    Review of Systems  Constitutional:  Negative for chills, fever and weight loss.  HENT:  Positive for tinnitus. Negative for congestion and  hearing loss.   Eyes:  Negative for blurred vision and double vision.  Respiratory:  Negative for cough and shortness of breath.   Cardiovascular:  Negative for chest pain, palpitations, orthopnea and leg swelling.  Gastrointestinal:  Negative for abdominal pain, constipation, diarrhea, heartburn, nausea and vomiting.  Genitourinary:  Negative for dysuria.  Musculoskeletal:  Negative for falls, joint pain and myalgias.  Skin:  Negative for rash.  Neurological:  Negative for dizziness, tingling, tremors, loss of consciousness and headaches.  Endo/Heme/Allergies:  Bruises/bleeds easily.  Psychiatric/Behavioral:  Positive for memory loss (difficulty remebering things quickly). Negative for depression and suicidal ideas. The patient has insomnia.     Objective:   Today's Vitals   01/16/22 1516  BP: (!) 140/78  Pulse: 84  Temp: 97.7 F (36.5 C)  SpO2: 95%  Weight: 159 lb (72.1 kg)    Body mass index is 26.87 kg/m.  General appearance: alert, no distress, WD/WN, male HEENT: normocephalic, sclerae anicteric, Bil ear canels clear, hearing aids, nares patent, no discharge or erythema, pharynx normal Oral cavity: MMM, no lesions Neck: supple, no lymphadenopathy, no thyromegaly, no masses Heart: RRR, normal S1, S2, no murmurs Lungs: CTA bilaterally, no wheezes, rhonchi, or rales Abdomen: +bs, soft, non tender, non distended, no masses, no hepatomegaly, no splenomegaly Musculoskeletal: nontender, no swelling, no obvious deformity Extremities: no edema, no cyanosis, no clubbing Pulses: 2+ symmetric, upper and lower extremities, normal cap refill Neurological: alert, oriented x 3, CN2-12 intact, strength normal upper extremities and lower extremities, sensation normal throughout, DTRs 2+ throughout, no cerebellar signs, gait normal Psychiatric: normal affect, behavior normal, pleasant . Oriented x 3 . 3 word recall done appropriately.  Medicare Attestation I have personally reviewed: The  patient's medical and social history Their use of alcohol, tobacco or illicit drugs Their current medications and supplements The patient's functional ability including ADLs,fall risks, home safety risks, cognitive, and hearing and visual impairment Diet and physical activities Evidence for depression or mood disorders  The patient's weight, height, BMI, and visual acuity have been recorded in the chart.  I have made referrals, counseling, and provided education to the patient based on review of the above and I have provided the patient with a written personalized care plan for preventive services.     Darrol Jump, NP   01/16/2022

## 2022-01-17 LAB — CBC WITH DIFFERENTIAL/PLATELET
Absolute Monocytes: 560 cells/uL (ref 200–950)
Basophils Absolute: 80 cells/uL (ref 0–200)
Basophils Relative: 0.8 %
Eosinophils Absolute: 150 cells/uL (ref 15–500)
Eosinophils Relative: 1.5 %
HCT: 43.5 % (ref 38.5–50.0)
Hemoglobin: 15.1 g/dL (ref 13.2–17.1)
Lymphs Abs: 4560 cells/uL — ABNORMAL HIGH (ref 850–3900)
MCH: 31.9 pg (ref 27.0–33.0)
MCHC: 34.7 g/dL (ref 32.0–36.0)
MCV: 91.8 fL (ref 80.0–100.0)
MPV: 10.7 fL (ref 7.5–12.5)
Monocytes Relative: 5.6 %
Neutro Abs: 4650 cells/uL (ref 1500–7800)
Neutrophils Relative %: 46.5 %
Platelets: 226 10*3/uL (ref 140–400)
RBC: 4.74 10*6/uL (ref 4.20–5.80)
RDW: 13 % (ref 11.0–15.0)
Total Lymphocyte: 45.6 %
WBC: 10 10*3/uL (ref 3.8–10.8)

## 2022-01-17 LAB — COMPLETE METABOLIC PANEL WITH GFR
AG Ratio: 1.9 (calc) (ref 1.0–2.5)
ALT: 15 U/L (ref 9–46)
AST: 19 U/L (ref 10–35)
Albumin: 4.3 g/dL (ref 3.6–5.1)
Alkaline phosphatase (APISO): 82 U/L (ref 35–144)
BUN: 14 mg/dL (ref 7–25)
CO2: 27 mmol/L (ref 20–32)
Calcium: 9.7 mg/dL (ref 8.6–10.3)
Chloride: 107 mmol/L (ref 98–110)
Creat: 0.87 mg/dL (ref 0.70–1.22)
Globulin: 2.3 g/dL (calc) (ref 1.9–3.7)
Glucose, Bld: 103 mg/dL — ABNORMAL HIGH (ref 65–99)
Potassium: 4 mmol/L (ref 3.5–5.3)
Sodium: 143 mmol/L (ref 135–146)
Total Bilirubin: 0.4 mg/dL (ref 0.2–1.2)
Total Protein: 6.6 g/dL (ref 6.1–8.1)
eGFR: 85 mL/min/{1.73_m2} (ref >=60)

## 2022-01-17 LAB — LIPID PANEL
Cholesterol: 218 mg/dL — ABNORMAL HIGH (ref <200)
HDL: 51 mg/dL (ref >=40)
LDL Cholesterol (Calc): 136 mg/dL (calc) — ABNORMAL HIGH
Non-HDL Cholesterol (Calc): 167 mg/dL (calc) — ABNORMAL HIGH (ref <130)
Total CHOL/HDL Ratio: 4.3 (calc) (ref <5.0)
Triglycerides: 174 mg/dL — ABNORMAL HIGH (ref <150)

## 2022-01-17 LAB — HEMOGLOBIN A1C
Hgb A1c MFr Bld: 5.6 % of total Hgb (ref <5.7)
Mean Plasma Glucose: 114 mg/dL
eAG (mmol/L): 6.3 mmol/L

## 2022-01-17 LAB — TSH: TSH: 0.87 mIU/L (ref 0.40–4.50)

## 2022-02-01 ENCOUNTER — Encounter: Payer: Self-pay | Admitting: Internal Medicine

## 2022-02-01 NOTE — Progress Notes (Signed)
Future Appointments  Date Time Provider Department  02/02/2022                  9 mo ov 10:30 AM [name], MD [facility]  03/21/2022  7:30 AM Hayden Pedro, MD [facility]  05/04/2022                    cpe 10:00 AM [name], MD [facility]  08/07/2022                      wellness  4:00 PM Darrol Jump, NP [facility]    History of Present Illness:       This very nice 86 y.o. single WM presents for 9 month follow up with HTN, HLD, Pre-Diabetes, COPD  and Vitamin D Deficiency. Abd CT scan in Feb 2020 revealed Aortic Atherosclerosis.        Patient is treated for HTN  (1998)   & BP has been controlled at home. Today's BP: 128/78. Patient has had no complaints of any cardiac type chest pAbd CT scan in Feb 2020 revealed Aortic Atherosclerosis. ain, palpitations, dyspnea / orthopnea / PND, dizziness, claudication, or dependent edema.       Hyperlipidemia was controlled with diet &as apparently patient had been off of his meds.   Patient denies myalgias or other med SE's. Last Lipids were not at goal :  Lab Results  Component Value Date   CHOL 218 (H) 01/16/2022   HDL 51 01/16/2022   LDLCALC 136 (H) 01/16/2022   TRIG 174 (H) 01/16/2022   CHOLHDL 4.3 01/16/2022     Also, the patient has history of PreDiabetes  (A1c = 6.2% /2011)  and has had no symptoms of reactive hypoglycemia, diabetic polys, paresthesias or visual blurring.  Last A1c was at goal :  Lab Results  Component Value Date   HGBA1C 5.6 01/16/2022                                                         Further, the patient also has history of Vitamin D Deficiency ("20" /2009)  and supplements vitamin D. Last vitamin D was at goal :  Lab Results  Component Value Date   VD25OH 79 04/25/2021     Current Outpatient Medications on File Prior to Visit  Medication Sig   aspirin 81 MG tablet Take daily.   VITAMIN D 2000 UNITS CAPS Take 4,000 Units  daily.   Multiple Vitamin  Take 1 tablet every  evening.    Omega-3 FISH OIL 1000 MG CAPS Take 1 capsule  2 (two) times a week.   rosuvastatin  10 MG tablet Take  1 tablet  every other day (on even days)    vitamin C 500 MG tablet Take  daily.   zinc  50 MG tablet Take  daily.     Medications   rosuvastatin (CRESTOR) 10 MG tablet, Take  1 tablet  every other day (on even days)   aspirin 81 MG tablet, Take  daily.   Cholecalciferol (VITAMIN D) 2000 UNITS CAPS, Take 4,000 Units  daily.   Multiple Vitamin (, Take 1 tablet by mouth every evening.    Omega-3 FISH OIL 1000 MG CAPS, Take 1 capsule by mouth 2 (two) times a  week.   vitamin C 500 MG tablet, Take  daily.   zinc  50 MG tablet, Take 5 daily.   Allergies  Allergen Reactions   Nitrofuran Derivatives Shortness Of Breath    macrobid     PMHx:   Past Medical History:  Diagnosis Date   Allergy    seasonal   Bladder tumor    Cancer ([place])    bladder,, lung   COPD (chronic obstructive pulmonary disease) ([place])    Diverticulosis of sigmoid colon    2016   History of kidney stones    History o lung cancer 2008  S/P RUL    STAGE I  NON-SMALL CELL LUNG CANCER--  ONCOLOGIST- DR Julien Nordmann-   NO RECURRENCE   Hyperlipidemia    Hypertension    Impaired hearing bilateral aids   Irregular heart rate    per pt heart will skip a beat    Kidney stone 07/08/2019   vitamin D deficiency      Immunization History  Administered Date(s) Administered   Influenza, High Dose Seasonal PF 11/03/2014, 11/02/2016, 11/28/2017, 11/30/2018, 10/28/2020   Influenza,inj,Quad PF,6+ Mos 11/09/2015   Influenza-Unspecified 11/06/2012, 12/17/2021   PFIZER(Purple Top)SARS-COV-2 Vaccination 02/27/2019, 03/19/2019, 01/09/2020   Pfizer Covid-19 Vaccine Bivalent Booster 35yrs & up 12/27/2020, 12/17/2021   Pneumococcal Conjugate-13 01/20/2014   Pneumococcal Polysaccharide-23 04/25/2021   Pneumococcal-Unspecified 02/06/2002, 09/23/2012   Tdap 03/09/2010   Zoster, Live 08/03/2014     Past Surgical  History:  Procedure Laterality Date   COLONOSCOPY  05-09-2004   HPP and tics [place]    EXTRACORPOREAL SHOCK WAVE LITHOTRIPSY Left 04/18/2018   Procedure: EXTRACORPOREAL SHOCK WAVE LITHOTRIPSY (ESWL);  Surgeon: Festus Aloe, MD;  Location: [facility];  Service: Urology;  Laterality: Left;   FIBEROPTIC BRONCHOSCOPY  10-28-2008   LITHOTRIPSY  2012   LUNG REMOVAL, PARTIAL  2008   PROSTATE SURGERY     prostatectomy   RIGHT UPPER LUNG LOBECTOMY  09-21-2006  DR BURNEY   NON-SMALL CELL CANCER/ SEVERE COPD   TRANSURETHRAL RESECTION OF BLADDER TUMOR  12/25/2011   Procedure: TRANSURETHRAL RESECTION OF BLADDER TUMOR (TURBT);  Surgeon: Franchot Gallo, MD;  Location: [facility];  Service: Urology;  Laterality: N/A;   TRANSURETHRAL RESECTION OF PROSTATE  12/25/2011   Procedure: TRANSURETHRAL RESECTION OF THE PROSTATE WITH GYRUS INSTRUMENTS;  Surgeon: Franchot Gallo, MD;  Location: [facility];  Service: Urology;  Laterality: N/A;  2 HRS     FHx:    Reviewed / unchanged  SHx:    Reviewed / unchanged   Systems Review:  Constitutional: Denies fever, chills, wt changes, headaches, insomnia, fatigue, night sweats, change in appetite. Eyes: Denies redness, blurred vision, diplopia, discharge, itchy, watery eyes.  ENT: Denies discharge, congestion, post nasal drip, epistaxis, sore throat, earache, hearing loss, dental pain, tinnitus, vertigo, sinus pain, snoring.  CV: Denies chest pain, palpitations, irregular heartbeat, syncope, dyspnea, diaphoresis, orthopnea, PND, claudication or edema. Respiratory: denies cough, dyspnea, DOE, pleurisy, hoarseness, laryngitis, wheezing.  Gastrointestinal: Denies dysphagia, odynophagia, heartburn, reflux, water brash, abdominal pain or cramps, nausea, vomiting, bloating, diarrhea, constipation, hematemesis, melena, hematochezia  or hemorrhoids. Genitourinary: Denies dysuria, frequency, urgency, nocturia, hesitancy, discharge, hematuria  or flank pain. Musculoskeletal: Denies arthralgias, myalgias, stiffness, jt. swelling, pain, limping or strain/sprain.  Skin: Denies pruritus, rash, hives, warts, acne, eczema or change in skin lesion(s). Neuro: No weakness, tremor, incoordination, spasms, paresthesia or pain. Psychiatric: Denies confusion, memory loss or sensory loss. Endo: Denies change in weight, skin or hair change.  Heme/Lymph: No excessive bleeding, bruising or enlarged lymph nodes.  Physical Exam  BP 128/78   Pulse 79   Temp 97.9 F (36.6 C)   Resp 17   Ht 5' 4.5" (1.638 m)   Wt 156 lb 6.4 oz (70.9 kg)   SpO2 99%   BMI 26.43 kg/m   Appears  well nourished, well groomed  and in no distress.  Eyes: PERRLA, EOMs, conjunctiva no swelling or erythema. Sinuses: No frontal/maxillary tenderness ENT/Mouth: EAC's clear, TM's nl w/o erythema, bulging. Nares clear w/o erythema, swelling, exudates. Oropharynx clear without erythema or exudates. Oral hygiene is good. Tongue normal, non obstructing. Hearing intact.  Neck: Supple. Thyroid not palpable. Car 2+/2+ without bruits, nodes or JVD. Chest: Respirations nl with BS clear & equal w/o rales, rhonchi, wheezing or stridor.  Cor: Heart sounds normal w/ regular rate and rhythm without sig. murmurs, gallops, clicks or rubs. Peripheral pulses normal and equal  without edema.  Abdomen: Soft & bowel sounds normal. Non-tender w/o guarding, rebound, hernias, masses or organomegaly.  Lymphatics: Unremarkable.  Musculoskeletal: Full ROM all peripheral extremities, joint stability, 5/5 strength and normal gait.  Skin: Warm, dry without exposed rashes, lesions or ecchymosis apparent.  Neuro: Cranial nerves intact, reflexes equal bilaterally. Sensory-motor testing grossly intact. Tendon reflexes grossly intact.  Pysch: Alert & oriented x 3.  Insight and judgement nl & appropriate. No ideations.  Assessment and Plan:  1. Essential hypertension  - Continue medication, monitor  blood pressure at home.  - Continue DASH diet.  Reminder to go to the ER if any CP,  SOB, nausea, dizziness, severe HA, changes vision/speech.    - Magnesium  2. Hyperlipidemia, mixed  - Continue diet/meds, exercise, & lifestyle modifications.  - Continue monitor periodic cholesterol/liver & renal functions     3. Abnormal glucose  - Continue diet, exercise  - Lifestyle modifications - Monitor appropriate labs   - Insulin, random  4. Vitamin D deficiency  - Continue supplementation  - VITAMIN D 25 Hydroxy   5. Medication management  - Magnesium - Insulin, random - VITAMIN D 25 Hydroxy          Discussed  regular exercise, BP monitoring, weight control to achieve/maintain BMI less than 25 and discussed med and SE's. Recommended labs to assess /monitor clinical status .  I discussed the assessment and treatment plan with the patient. The patient was provided an opportunity to ask questions and all were answered. The patient agreed with the plan and demonstrated an understanding of the instructions.  I provided over 30 minutes of exam, counseling, chart review and  complex critical decision making.        The patient was advised to call back or seek an in-person evaluation if the symptoms worsen or if the condition fails to improve as anticipated.   Kirtland Bouchard, MD

## 2022-02-01 NOTE — Patient Instructions (Signed)

## 2022-02-02 ENCOUNTER — Encounter: Payer: Self-pay | Admitting: Internal Medicine

## 2022-02-02 ENCOUNTER — Ambulatory Visit (INDEPENDENT_AMBULATORY_CARE_PROVIDER_SITE_OTHER): Payer: PPO | Admitting: Internal Medicine

## 2022-02-02 VITALS — BP 128/78 | HR 79 | Temp 97.9°F | Resp 17 | Ht 64.5 in | Wt 156.4 lb

## 2022-02-02 DIAGNOSIS — R7309 Other abnormal glucose: Secondary | ICD-10-CM | POA: Diagnosis not present

## 2022-02-02 DIAGNOSIS — I10 Essential (primary) hypertension: Secondary | ICD-10-CM

## 2022-02-02 DIAGNOSIS — E559 Vitamin D deficiency, unspecified: Secondary | ICD-10-CM | POA: Diagnosis not present

## 2022-02-02 DIAGNOSIS — E782 Mixed hyperlipidemia: Secondary | ICD-10-CM | POA: Diagnosis not present

## 2022-02-02 DIAGNOSIS — Z79899 Other long term (current) drug therapy: Secondary | ICD-10-CM | POA: Diagnosis not present

## 2022-02-02 MED ORDER — ROSUVASTATIN CALCIUM 10 MG PO TABS: ORAL_TABLET | ORAL | 3 refills | Status: DC

## 2022-02-03 LAB — INSULIN, RANDOM: Insulin: 5.1 u[IU]/mL

## 2022-02-03 LAB — MAGNESIUM: Magnesium: 2.5 mg/dL (ref 1.5–2.5)

## 2022-02-03 LAB — VITAMIN D 25 HYDROXY (VIT D DEFICIENCY, FRACTURES): Vit D, 25-Hydroxy: 70 ng/mL (ref 30–100)

## 2022-02-03 NOTE — Progress Notes (Signed)
<><><><><><><><><><><><><><><><><><><><><><><><><><><><><><><><><> <><><><><><><><><><><><><><><><><><><><><><><><><><><><><><><><><> -   Test results slightly outside the reference range are not unusual. If there is anything important, I will review this with you,  otherwise it is considered normal test values.  If you have further questions,  please do not hesitate to contact me at the office or via My Chart.  <><><><><><><><><><><><><><><><><><><><><><><><><><><><><><><><><> <><><><><><><><><><><><><><><><><><><><><><><><><><><><><><><><><>  -  Vitamin D = 70 - Excellent  - Please keep dose same   - All else Normal & OK   <><><><><><><><><><><><><><><><><><><><><><><><><><><><><><><><><> <><><><><><><><><><><><><><><><><><><><><><><><><><><><><><><><><>

## 2022-02-07 ENCOUNTER — Other Ambulatory Visit: Payer: Self-pay | Admitting: Internal Medicine

## 2022-02-07 DIAGNOSIS — F419 Anxiety disorder, unspecified: Secondary | ICD-10-CM

## 2022-02-07 MED ORDER — BUSPIRONE HCL 10 MG PO TABS: ORAL_TABLET | ORAL | 1 refills | Status: DC

## 2022-03-21 ENCOUNTER — Encounter (INDEPENDENT_AMBULATORY_CARE_PROVIDER_SITE_OTHER): Payer: PPO | Admitting: Ophthalmology

## 2022-03-21 DIAGNOSIS — H35033 Hypertensive retinopathy, bilateral: Secondary | ICD-10-CM | POA: Diagnosis not present

## 2022-03-21 DIAGNOSIS — I10 Essential (primary) hypertension: Secondary | ICD-10-CM

## 2022-03-21 DIAGNOSIS — H34812 Central retinal vein occlusion, left eye, with macular edema: Secondary | ICD-10-CM | POA: Diagnosis not present

## 2022-03-21 DIAGNOSIS — H43813 Vitreous degeneration, bilateral: Secondary | ICD-10-CM

## 2022-05-01 ENCOUNTER — Encounter: Payer: PPO | Admitting: Internal Medicine

## 2022-05-03 ENCOUNTER — Encounter: Payer: Self-pay | Admitting: Internal Medicine

## 2022-05-03 NOTE — Patient Instructions (Signed)

## 2022-05-03 NOTE — Progress Notes (Signed)
Annual  Screening/Preventative Visit  & Comprehensive Evaluation & Examination   Future Appointments  Date Time Provider Department  05/04/2022                        cpe 10:00 AM [name], MD [facility]  05/30/2022  7:30 AM Hayden Pedro, MD [facility]  08/07/2022                          wellness  4:00 PM Darrol Jump, NP [facility]  05/09/2023                           cpe 10:00 AM [name], MD [facility]            This very nice 88 y.o. single WM presents for a Screening /Preventative Visit & comprehensive evaluation and management of multiple medical co-morbidities.  Patient has been followed for HTN, HLD, Prediabetes and Vitamin D Deficiency. Patient has COPD as defined on prior CXRs & Chest CT scans. Abd CT scan in Feb 2020 revealed Aortic Atherosclerosis. Patient has hx/o Lung , Bladder & Prostate Cancers.          HTN predates since 1998. Patient's BP has been controlled at home.  Today's BP is at goal -                        . Patient denies any cardiac symptoms as chest pain, palpitations, shortness of breath, dizziness or ankle swelling.       Patient's hyperlipidemia is not controlled with diet and he's was off of his rosuvastatin at last lab. Patient denies myalgias or other medication SE's. Last lipids were not at goal :  Lab Results  Component Value Date   CHOL 218 (H) 01/16/2022   HDL 51 01/16/2022   LDLCALC 136 (H) 01/16/2022   TRIG 174 (H) 01/16/2022   CHOLHDL 4.3 01/16/2022         Patient has hx/o prediabetes (A1c = 6.2%  /2011)  and patient denies reactive hypoglycemic symptoms, visual blurring, diabetic polys or paresthesias. Last A1c was normal & at goal :   Lab Results  Component Value Date   HGBA1C 5.6 01/16/2022         Finally, patient has history of Vitamin D Deficiency  ("20" /2009) and last vitamin D was at goal :   Lab Results  Component Value Date   VD25OH 2 02/02/2022       Current Outpatient Medications on File  Prior to Visit  Medication Sig   Amitriptyline 50 MG tablet Take 1 tablet  at bedtime as needed for sleep.   aspirin 81 MG tablet Take  daily.   VITAMIN D 2000 UNITS  Take  4,000 Units daily.   Multiple Vitamin Take 1 tablet by mouth every evening.    Omega-3 FISH OIL 1000 MG  Take 1 capsule  2 (two) times a week.   rosuvastatin  10 MG tablet Take  1 tablet  every other day    vitamin C  500 MG tablet Take  daily.   zinc 50 MG tablet Take daily.     Allergies  Allergen Reactions   Nitrofuran Derivatives Shortness Of Breath    macrobid     Past Medical History:  Diagnosis Date   Allergy    seasonal   Bladder tumor  Cancer ([place])    bladder, lung   COPD (chronic obstructive pulmonary disease) ([place])    Diverticulosis of sigmoid colon    2016   History of kidney stones    History of lung cancer 2008  S/P RUL lobectomy    STAGE I  NON-SMALL CELL LUNG CANCER--  ONCOLOGIST- DR Julien Nordmann-   NO RECURRENCE   Hyperlipidemia    Hypertension    Impaired hearing bilateral aids   Irregular heart rate    per pt heart will skip a beat    Kidney stone 07/08/2019   vitamin D deficiency      Health Maintenance  Topic Date Due   Zoster Vaccines- Shingrix (1 of 2) Never done   Pneumonia Vaccine 26+ Years old (2 - PPSV23 if available, else PCV20) 01/21/2015   TETANUS/TDAP  10/28/2021 (Originally 03/09/2020)   INFLUENZA VACCINE  Completed   COVID-19 Vaccine  Completed     Immunization History  Administered Date(s) Administered   Influenza, High Dose  11/30/2018, 10/28/2020   Influenza,inj,Quad  11/09/2015   Influenza-Unspecified 11/06/2012   PFIZER SARS-COV-2 Vacc 02/27/2019, 03/19/2019, 01/09/2020   Pfizer Covid-19 Vacc Bivalent 12/27/2020   Pneumococcal -13 01/20/2014   Pneumococcal-23 02/06/2002, 09/23/2012   Tdap 03/09/2010   Zoster, Live 08/03/2014    Last Colon - 11.04.2020 - Dr Havery Moros - Recc no f/u due to age  Past Surgical History:  Procedure Laterality Date    COLONOSCOPY  05-09-2004   HPP and tics [place] LITHOTRIPSY Left 04/18/2018   Procedure: EXTRACORPOREAL SHOCK WAVE LITHOTRIPSY (ESWL);  Surgeon: Festus Aloe, MD;  Location: [facility];  Service: Urology;  Laterality: Left;   FIBEROPTIC BRONCHOSCOPY  10-28-2008   LITHOTRIPSY  2012   LUNG REMOVAL, PARTIAL  2008   PROSTATE SURGERY     prostatectomy   RIGHT UPPER LUNG LOBECTOMY  09-21-2006  DR BURNEY   NON-SMALL CELL CANCER/ SEVERE COPD   TRANSURETHRAL RESECTION OF BLADDER TUMOR  12/25/2011   Procedure: TRANSURETHRAL RESECTION OF BLADDER TUMOR (TURBT);  Surgeon: Franchot Gallo, MD;  Location: [facility];  Service: Urology;  Laterality: N/A;   TRANSURETHRAL RESECTION OF PROSTATE  12/25/2011   Procedure: TRANSURETHRAL RESECTION OF THE PROSTATE WITH GYRUS INSTRUMENTS;  Surgeon: Franchot Gallo, MD;  Location: [facility];  Service: Urology;  Laterality: N/A;  2 HRS      Family History  Problem Relation Age of Onset   Cancer Mother        Non-Hodgkin's Lymphoma   Diabetes Mother    Hypertension Mother    Colon polyps Mother    Cancer Father        Colon cancer w/ metastasis to lung & bone   Colon cancer Father    Cancer Brother 63       Lung cancer   Diabetes Maternal Grandmother    Heart failure Maternal Grandmother    Cancer Brother 71       Non-small cell Lung cancer   Pneumonia Maternal Grandfather    Esophageal cancer Neg Hx    Rectal cancer Neg Hx    Stomach cancer Neg Hx      Social History   Tobacco Use   Smoking status: Former    Packs/day: 1.00    Years: 35.00    Pack years: 35.00    Types: Cigarettes    Quit date: 02/07/2004    Years since quitting: 17.2   Smokeless tobacco: Never  Substance Use Topics  Alcohol use: Yes    Alcohol/week: 0.0 standard drinks    Comment: socially   Drug use: No      ROS Constitutional: Denies fever, chills, weight loss/gain, headaches, insomnia,  night sweats or  change in appetite. Does c/o fatigue. Eyes: Denies redness, blurred vision, diplopia, discharge, itchy or watery eyes.  ENT: Denies discharge, congestion, post nasal drip, epistaxis, sore throat, earache, hearing loss, dental pain, Tinnitus, Vertigo, Sinus pain or snoring.  Cardio: Denies chest pain, palpitations, irregular heartbeat, syncope, dyspnea, diaphoresis, orthopnea, PND, claudication or edema Respiratory: denies cough, dyspnea, DOE, pleurisy, hoarseness, laryngitis or wheezing.  Gastrointestinal: Denies dysphagia, heartburn, reflux, water brash, pain, cramps, nausea, vomiting, bloating, diarrhea, constipation, hematemesis, melena, hematochezia, jaundice or hemorrhoids Genitourinary: Denies dysuria, frequency, discharge, hematuria or flank pain. Has urgency, nocturia x 2-3 & occasional hesitancy. Musculoskeletal: Denies arthralgia, myalgia, stiffness, Jt. Swelling, pain, limp or strain/sprain. Denies Falls. Skin: Denies puritis, rash, hives, warts, acne, eczema or change in skin lesion Neuro: No weakness, tremor, incoordination, spasms, paresthesia or pain Psychiatric: Denies confusion, memory loss or sensory loss. Denies Depression. Endocrine: Denies change in weight, skin, hair change, nocturia, and paresthesia, diabetic polys, visual blurring or hyper / hypo glycemic episodes.  Heme/Lymph: No excessive bleeding, bruising or enlarged lymph nodes.   Physical Exam  There were no vitals taken for this visit.  General Appearance: Well nourished and well groomed and in no apparent distress.  Congested cough. No stridor.   Eyes: PERRLA, EOMs, conjunctiva no swelling or erythema, normal fundi and vessels. Sinuses: No frontal/maxillary tenderness ENT/Mouth: EACs patent / TMs  nl. Nares clear without erythema, swelling, mucoid exudates. Oral hygiene is good. No erythema, swelling, or exudate. Tongue normal, non-obstructing. Tonsils not swollen or erythematous. Hearing normal.  Neck:  Supple, thyroid not palpable. No bruits, nodes or JVD. Respiratory: Respiratory effort normal.  BS equal and clear bilateral without rales, rhonci, wheezing or stridor. Cardio: Heart sounds are normal with regular rate and rhythm and no murmurs, rubs or gallops. Peripheral pulses are normal and equal bilaterally without edema. No aortic or femoral bruits. Chest: Scattered rales & rhonchi.  Abdomen: Soft, with Nl bowel sounds. Nontender, no guarding, rebound, hernias, masses, or organomegaly.  Lymphatics: Non tender without lymphadenopathy.  Musculoskeletal: Full ROM all peripheral extremities, joint stability, 5/5 strength, and normal gait. Skin: Warm and dry without rashes, lesions, cyanosis, clubbing or  ecchymosis.  Neuro: Cranial nerves intact, reflexes equal bilaterally. Normal muscle tone, no cerebellar symptoms. Sensation intact.  Pysch: Alert and oriented X 3 with normal affect, insight and judgment appropriate.   Assessment and Plan  1. Annual Preventative/Screening Exam    2. Essential hypertension  - EKG 12-Lead - [place], RETROPERITNL ABD,  LTD - DG Chest 2 View; Future - Urinalysis, Routine w reflex microscopic - Microalbumin / creatinine urine ratio - CBC with Differential/Platelet - COMPLETE METABOLIC PANEL WITH GFR - Magnesium - TSH  3. Hyperlipidemia, mixed  - EKG 12-Lead - [place], RETROPERITNL ABD,  LTD - Lipid panel - TSH  4. Abnormal glucose  - EKG 12-Lead - [place], RETROPERITNL ABD,  LTD - Hemoglobin A1c - Insulin, random  5. Vitamin D deficiency  - VITAMIN D 25 Hydroxy   6. Aortic atherosclerosis ([place]) by Abd  CTscan on 03/18/2018  - EKG 12-Lead - [place], RETROPERITNL ABD,  LTD - Lipid panel  7. Chronic obstructive pulmonary disease(HCC)  - DG Chest 2 View  8. BPH with obstruction/lower urinary tract symptoms  - PSA  9. History of  lung cancer  - DG Chest 2 View; Future  10. History of prostate cancer  - PSA  11. Screening for colorectal  cancer   12. Screening for heart disease  - EKG 12-Lead  13. FHx: heart disease  - EKG 12-Lead - [place], RETROPERITNL ABD,  LTD  14. History of smoking  - EKG 12-Lead - [place], RETROPERITNL ABD,  LTD - DG Chest 2 View; Future  15. Screening for AAA (aortic abdominal aneurysm)  - [place], RETROPERITNL ABD,  LTD  16. Medication management  - CBC with Differential/Platelet - COMPLETE METABOLIC PANEL WITH GFR - Magnesium - Lipid panel - TSH - Hemoglobin A1c - Insulin, random - VITAMIN D 25 Hydroxy   17. Acute exacerbation of Chronic Bronchitis  - Rx Zpak, Decadron taper & Phenergan-DM syrup.           Patient was counseled in prudent diet, weight control to achieve/maintain BMI less than 25, BP monitoring, regular exercise and medications as discussed.  Discussed med effects and SE's. Routine screening labs and tests as requested with regular follow-up as recommended. Over 40 minutes of exam, counseling, chart review and high complex critical decision making was performed   Kirtland Bouchard, MD

## 2022-05-04 ENCOUNTER — Ambulatory Visit (INDEPENDENT_AMBULATORY_CARE_PROVIDER_SITE_OTHER): Payer: PPO | Admitting: Internal Medicine

## 2022-05-04 ENCOUNTER — Encounter: Payer: Self-pay | Admitting: Internal Medicine

## 2022-05-04 VITALS — BP 124/72 | HR 76 | Temp 97.9°F | Resp 16 | Ht 64.5 in | Wt 156.4 lb

## 2022-05-04 DIAGNOSIS — R7309 Other abnormal glucose: Secondary | ICD-10-CM | POA: Diagnosis not present

## 2022-05-04 DIAGNOSIS — Z79899 Other long term (current) drug therapy: Secondary | ICD-10-CM | POA: Diagnosis not present

## 2022-05-04 DIAGNOSIS — Z0000 Encounter for general adult medical examination without abnormal findings: Secondary | ICD-10-CM

## 2022-05-04 DIAGNOSIS — Z87891 Personal history of nicotine dependence: Secondary | ICD-10-CM

## 2022-05-04 DIAGNOSIS — E782 Mixed hyperlipidemia: Secondary | ICD-10-CM

## 2022-05-04 DIAGNOSIS — Z85118 Personal history of other malignant neoplasm of bronchus and lung: Secondary | ICD-10-CM

## 2022-05-04 DIAGNOSIS — N401 Enlarged prostate with lower urinary tract symptoms: Secondary | ICD-10-CM

## 2022-05-04 DIAGNOSIS — I700 Atherosclerosis of aorta: Secondary | ICD-10-CM

## 2022-05-04 DIAGNOSIS — I10 Essential (primary) hypertension: Secondary | ICD-10-CM | POA: Diagnosis not present

## 2022-05-04 DIAGNOSIS — Z8546 Personal history of malignant neoplasm of prostate: Secondary | ICD-10-CM

## 2022-05-04 DIAGNOSIS — N138 Other obstructive and reflux uropathy: Secondary | ICD-10-CM | POA: Diagnosis not present

## 2022-05-04 DIAGNOSIS — Z0001 Encounter for general adult medical examination with abnormal findings: Secondary | ICD-10-CM

## 2022-05-04 DIAGNOSIS — Z1211 Encounter for screening for malignant neoplasm of colon: Secondary | ICD-10-CM

## 2022-05-04 DIAGNOSIS — E559 Vitamin D deficiency, unspecified: Secondary | ICD-10-CM

## 2022-05-04 DIAGNOSIS — Z136 Encounter for screening for cardiovascular disorders: Secondary | ICD-10-CM | POA: Diagnosis not present

## 2022-05-04 DIAGNOSIS — Z8249 Family history of ischemic heart disease and other diseases of the circulatory system: Secondary | ICD-10-CM

## 2022-05-04 DIAGNOSIS — J449 Chronic obstructive pulmonary disease, unspecified: Secondary | ICD-10-CM

## 2022-05-05 LAB — URINALYSIS, ROUTINE W REFLEX MICROSCOPIC
Bilirubin Urine: NEGATIVE
Glucose, UA: NEGATIVE
Hgb urine dipstick: NEGATIVE
Ketones, ur: NEGATIVE
Leukocytes,Ua: NEGATIVE
Nitrite: NEGATIVE
Protein, ur: NEGATIVE
Specific Gravity, Urine: 1.018 (ref 1.001–1.035)
pH: 7 (ref 5.0–8.0)

## 2022-05-05 LAB — CBC WITH DIFFERENTIAL/PLATELET
Absolute Monocytes: 633 cells/uL (ref 200–950)
Basophils Absolute: 111 cells/uL (ref 0–200)
Basophils Relative: 1 %
Eosinophils Absolute: 178 cells/uL (ref 15–500)
Eosinophils Relative: 1.6 %
HCT: 43 % (ref 38.5–50.0)
Hemoglobin: 14.5 g/dL (ref 13.2–17.1)
Lymphs Abs: 6016 cells/uL — ABNORMAL HIGH (ref 850–3900)
MCH: 31.4 pg (ref 27.0–33.0)
MCHC: 33.7 g/dL (ref 32.0–36.0)
MCV: 93.1 fL (ref 80.0–100.0)
MPV: 10.8 fL (ref 7.5–12.5)
Monocytes Relative: 5.7 %
Neutro Abs: 4163 cells/uL (ref 1500–7800)
Neutrophils Relative %: 37.5 %
Platelets: 213 10*3/uL (ref 140–400)
RBC: 4.62 10*6/uL (ref 4.20–5.80)
RDW: 12.5 % (ref 11.0–15.0)
Total Lymphocyte: 54.2 %
WBC: 11.1 10*3/uL — ABNORMAL HIGH (ref 3.8–10.8)

## 2022-05-05 LAB — COMPLETE METABOLIC PANEL WITH GFR
AG Ratio: 2 (calc) (ref 1.0–2.5)
ALT: 19 U/L (ref 9–46)
AST: 22 U/L (ref 10–35)
Albumin: 4.3 g/dL (ref 3.6–5.1)
Alkaline phosphatase (APISO): 79 U/L (ref 35–144)
BUN: 16 mg/dL (ref 7–25)
CO2: 27 mmol/L (ref 20–32)
Calcium: 9.8 mg/dL (ref 8.6–10.3)
Chloride: 107 mmol/L (ref 98–110)
Creat: 0.95 mg/dL (ref 0.70–1.22)
Globulin: 2.2 g/dL (calc) (ref 1.9–3.7)
Glucose, Bld: 89 mg/dL (ref 65–99)
Potassium: 4.2 mmol/L (ref 3.5–5.3)
Sodium: 144 mmol/L (ref 135–146)
Total Bilirubin: 0.6 mg/dL (ref 0.2–1.2)
Total Protein: 6.5 g/dL (ref 6.1–8.1)
eGFR: 79 mL/min/{1.73_m2} (ref >=60)

## 2022-05-05 LAB — HEMOGLOBIN A1C
Hgb A1c MFr Bld: 5.6 %{Hb}
Mean Plasma Glucose: 114 mg/dL
eAG (mmol/L): 6.3 mmol/L

## 2022-05-05 LAB — LIPID PANEL
Cholesterol: 138 mg/dL
HDL: 49 mg/dL
LDL Cholesterol (Calc): 73 mg/dL
Non-HDL Cholesterol (Calc): 89 mg/dL
Total CHOL/HDL Ratio: 2.8 (calc)
Triglycerides: 79 mg/dL

## 2022-05-05 LAB — VITAMIN D 25 HYDROXY (VIT D DEFICIENCY, FRACTURES): Vit D, 25-Hydroxy: 73 ng/mL (ref 30–100)

## 2022-05-05 LAB — MICROALBUMIN / CREATININE URINE RATIO
Creatinine, Urine: 130 mg/dL (ref 20–320)
Microalb Creat Ratio: 26 mg/g creat (ref <30)
Microalb, Ur: 3.4 mg/dL

## 2022-05-05 LAB — TSH: TSH: 1.42 mIU/L (ref 0.40–4.50)

## 2022-05-05 LAB — PSA: PSA: 1.14 ng/mL

## 2022-05-05 LAB — MAGNESIUM: Magnesium: 2.2 mg/dL (ref 1.5–2.5)

## 2022-05-05 LAB — INSULIN, RANDOM: Insulin: 9.6 u[IU]/mL

## 2022-05-07 NOTE — Progress Notes (Signed)
<><><><><><><><><><><><><><><><><><><><><><><><><><><><><><><><><> <><><><><><><><><><><><><><><><><><><><><><><><><><><><><><><><><> -   Test results slightly outside the reference range are not unusual. If there is anything important, I will review this with you,  otherwise it is considered normal test values.  If you have further questions,  please do not hesitate to contact me at the office or via My Chart.  <><><><><><><><><><><><><><><><><><><><><><><><><><><><><><><><><> <><><><><><><><><><><><><><><><><><><><><><><><><><><><><><><><><>  -  PSA - Low - No Prostate Cancer  - Great  ! <><><><><><><><><><><><><><><><><><><><><><><><><><><><><><><><><>  - WOW  - Chol = 138   &  LDL = 73 - Both much better    Excellent   - Very low risk for Heart Attack  / Stroke <><><><><><><><><><><><><><><><><><><><><><><><><><><><><><><><><>  - A1c - Normal - No Diabetes  - Great ! <><><><><><><><><><><><><><><><><><><><><><><><><><><><><><><><><>  -  Vitamin D = 73  - Excellent - Please keep dosage same <><><><><><><><><><><><><><><><><><><><><><><><><><><><><><><><><>  - All Else - CBC - Kidneys - Electrolytes - Liver - Magnesium & Thyroid    - all  Normal / OK <><><><><><><><><><><><><><><><><><><><><><><><><><><><><><><><><> <><><><><><><><><><><><><><><><><><><><><><><><><><><><><><><><><>

## 2022-05-30 ENCOUNTER — Encounter (INDEPENDENT_AMBULATORY_CARE_PROVIDER_SITE_OTHER): Payer: PPO | Admitting: Ophthalmology

## 2022-05-30 DIAGNOSIS — H35033 Hypertensive retinopathy, bilateral: Secondary | ICD-10-CM

## 2022-05-30 DIAGNOSIS — I10 Essential (primary) hypertension: Secondary | ICD-10-CM

## 2022-05-30 DIAGNOSIS — H34812 Central retinal vein occlusion, left eye, with macular edema: Secondary | ICD-10-CM

## 2022-05-30 DIAGNOSIS — H43813 Vitreous degeneration, bilateral: Secondary | ICD-10-CM | POA: Diagnosis not present

## 2022-06-12 NOTE — Progress Notes (Unsigned)
Assessment and Plan:  There are no diagnoses linked to this encounter.    Further disposition pending results of labs. Discussed med's effects and SE's.   Over 30 minutes of exam, counseling, chart review, and critical decision making was performed.   Future Appointments  Date Time Provider Department Center  06/13/2022  2:15 PM Raynelle Dick, NP [facility] None  08/14/2022 10:30 AM Adela Glimpse, NP [facility] None  08/15/2022  7:35 AM Sherrie George, MD [facility] None  11/15/2022 10:30 AM Lucky Cowboy, MD [facility] None  02/21/2023  9:30 AM Adela Glimpse, NP [facility] None  05/22/2023 11:00 AM Lucky Cowboy, MD [facility] None    ------------------------------------------------------------------------------------------------------------------   HPI There were no vitals taken for this visit. 87 y.o.male presents for  Past Medical History:  Diagnosis Date   Allergy    seasonal   Bladder tumor    Cancer (HCC)    bladder,, lung   COPD (chronic obstructive pulmonary disease) (HCC)    Diverticulosis of sigmoid colon    2016   History of kidney stones    History of lung cancer 2008  S/P RIGHT UPPER LOPECTOMY    STAGE I  NON-SMALL CELL LUNG CANCER--  ONCOLOGIST- DR Arbutus Ped-   NO RECURRENCE   Hyperlipidemia    Hypertension    Impaired hearing bilateral aids   Irregular heart rate    per pt heart will skip a beat    Kidney stone 07/08/2019   Unspecified vitamin D deficiency      Allergies  Allergen Reactions   Nitrofuran Derivatives Shortness Of Breath    macrobid    Current Outpatient Medications on File Prior to Visit  Medication Sig   aspirin 81 MG tablet Take 81 mg by mouth daily.   busPIRone (BUSPAR) 10 MG tablet Take 1/2 to 1 tablet  3 x /day for Anxiety   Cholecalciferol (VITAMIN D) 2000 UNITS CAPS Take 4,000 Units by mouth daily.   Multiple Vitamin (MULTIVITAMIN WITH MINERALS) TABS Take 1 tablet by mouth every evening.    Omega-3 Fatty Acids (FISH  OIL) 1000 MG CAPS Take 1 capsule by mouth 2 (two) times a week.   rosuvastatin (CRESTOR) 10 MG tablet Take  1 tablet  every other day (on even days)  for Cholesterol / Patient knows to take by mouth   vitamin C (ASCORBIC ACID) 500 MG tablet Take 500 mg by mouth daily.   zinc gluconate 50 MG tablet Take 50 mg by mouth daily.   No current facility-administered medications on file prior to visit.    ROS: all negative except above.   Physical Exam:  There were no vitals taken for this visit.  General Appearance: Well nourished, in no apparent distress. Eyes: PERRLA, EOMs, conjunctiva no swelling or erythema Sinuses: No Frontal/maxillary tenderness ENT/Mouth: Ext aud canals clear, TMs without erythema, bulging. No erythema, swelling, or exudate on post pharynx.  Tonsils not swollen or erythematous. Hearing normal.  Neck: Supple, thyroid normal.  Respiratory: Respiratory effort normal, BS equal bilaterally without rales, rhonchi, wheezing or stridor.  Cardio: RRR with no MRGs. Brisk peripheral pulses without edema.  Abdomen: Soft, + BS.  Non tender, no guarding, rebound, hernias, masses. Lymphatics: Non tender without lymphadenopathy.  Musculoskeletal: Full ROM, 5/5 strength, normal gait.  Skin: Warm, dry without rashes, lesions, ecchymosis.  Neuro: Cranial nerves intact. Normal muscle tone, no cerebellar symptoms. Sensation intact.  Psych: Awake and oriented X 3, normal affect, Insight and Judgment appropriate.     Surie Suchocki E  Aundria Rud, NP 4:18 PM [facility]

## 2022-06-13 ENCOUNTER — Ambulatory Visit (INDEPENDENT_AMBULATORY_CARE_PROVIDER_SITE_OTHER): Payer: PPO | Admitting: Nurse Practitioner

## 2022-06-13 ENCOUNTER — Encounter: Payer: Self-pay | Admitting: Nurse Practitioner

## 2022-06-13 VITALS — BP 138/84 | HR 65 | Temp 97.7°F | Ht 64.5 in | Wt 153.2 lb

## 2022-06-13 DIAGNOSIS — R300 Dysuria: Secondary | ICD-10-CM

## 2022-06-13 DIAGNOSIS — G4700 Insomnia, unspecified: Secondary | ICD-10-CM

## 2022-06-13 DIAGNOSIS — I10 Essential (primary) hypertension: Secondary | ICD-10-CM | POA: Diagnosis not present

## 2022-06-13 LAB — CBC WITH DIFFERENTIAL/PLATELET
HCT: 42.2 % (ref 38.5–50.0)
Hemoglobin: 14.1 g/dL (ref 13.2–17.1)
MCV: 94.4 fL (ref 80.0–100.0)
MPV: 10.8 fL (ref 7.5–12.5)
Neutrophils Relative %: 38.7 %

## 2022-06-13 MED ORDER — DOXYCYCLINE HYCLATE 100 MG PO CAPS
ORAL_CAPSULE | ORAL | 0 refills | Status: DC
Start: 2022-06-13 — End: 2022-08-14

## 2022-06-13 MED ORDER — AMITRIPTYLINE HCL 50 MG PO TABS
50.0000 mg | ORAL_TABLET | Freq: Every evening | ORAL | 0 refills | Status: DC | PRN
Start: 2022-06-13 — End: 2022-07-20

## 2022-06-13 NOTE — Patient Instructions (Signed)
Prostatitis  Prostatitis is swelling of the prostate gland, also called the prostate. This gland is about 1.5 inches wide and 1 inch high, and it helps to make a fluid called semen. The prostate is below a man's bladder, in front of the butt (rectum). There are different types of prostatitis. What are the causes? One type of prostatitis is caused by an infection from germs (bacteria). Another type is not caused by germs. It may be caused by: Things having to do with the nervous system. This system includes thebrain, spinal cord, and nerves. An autoimmune response. This happens when the body's disease-fighting system attacks healthy tissue in the body by mistake. Psychological factors. These have to do with how the mind works. The causes of other types of prostatitis are normally not known. What are the signs or symptoms? Symptoms of this condition depend on the type of prostatitis you have. If your condition is caused by germs: You may feel pain or burning when you pee (urinate). You may pee often and all of a sudden. You may have problems starting to pee. You may have trouble emptying your bladder when you pee. You may have fever or chills. You may feel pain in your muscles, joints, low back, or lower belly. If you have other types of prostatitis: You may pee often or all of a sudden. You may have trouble starting to pee. You may have a weak flow when you pee. You may leak pee after using the bathroom. You may have other problems, such as: Abnormal fluid coming from the penis. Pain in the testicles or penis. Pain between the butt and the testicles. Pain when fluid comes out of the penis during sex. How is this treated? Treatment for this condition depends on the type of prostatitis. Treatment may include: Medicines. These may treat pain or swelling, or they may help relax muscles. Exercises to help you move better or get stronger (physical therapy). Heat therapy. Techniques to help  you control some of the ways that your body works. Exercises to help you relax. Antibiotic medicine, if your condition is caused by germs. Warm water baths (sitz baths) to relax muscles. Follow these instructions at home: Medicines Take over-the-counter and prescription medicines only as told by your doctor. If you were prescribed an antibiotic medicine, take it as told by your doctor. Do not stop using the antibiotic even if you start to feel better. Managing pain and swelling  Take sitz baths as told by your doctor. For a sitz bath, sit in warm water that is deep enough to cover your hips and butt. If told, put heat on the painful area. Do this as often as told by your doctor. Use the heat source that your doctor recommends, such as a moist heat pack or a heating pad. Place a towel between your skin and the heat source. Leave the heat on for 20-30 minutes. Take off the heat if your skin turns bright red. This is very important if you are unable to feel pain, heat, or cold. You may have a greater risk of getting burned. General instructions Do exercises as told by your doctor, if your doctor prescribed them. Keep all follow-up visits as told by your doctor. This is important. Where to find more information National Institute of Diabetes and Digestive and Kidney Diseases: https://www.niddk.nih.gov Contact a doctor if: Your symptoms get worse. You have a fever. Get help right away if: You have chills. You feel light-headed. You feel like you may   faint. You cannot pee. You have blood or clumps of blood (blood clots) in your pee. Summary Prostatitis is swelling of the prostate gland. There are different types of prostatitis. Treatment depends on the type that you have. Take over-the-counter and prescription medicines only as told by your doctor. Get help right away of you have chills, feel light-headed, or feel like you may faint. Also get help right away if you cannot pee or you have  blood or clumps of blood in your pee. This information is not intended to replace advice given to you by your health care provider. Make sure you discuss any questions you have with your health care provider. Document Revised: 12/08/2021 Document Reviewed: 12/08/2021 Elsevier Patient Education  2023 Elsevier Inc.  

## 2022-06-14 LAB — URINALYSIS, ROUTINE W REFLEX MICROSCOPIC
Bacteria, UA: NONE SEEN /HPF
Bilirubin Urine: NEGATIVE
Glucose, UA: NEGATIVE
Hyaline Cast: NONE SEEN /LPF
Ketones, ur: NEGATIVE
Nitrite: NEGATIVE
Protein, ur: NEGATIVE
Specific Gravity, Urine: 1.011 (ref 1.001–1.035)
Squamous Epithelial / HPF: NONE SEEN /HPF (ref <=5)
pH: 6.5 (ref 5.0–8.0)

## 2022-06-14 LAB — URINE CULTURE
MICRO NUMBER:: 14925862
Result:: NO GROWTH
SPECIMEN QUALITY:: ADEQUATE

## 2022-06-14 LAB — CBC WITH DIFFERENTIAL/PLATELET
Absolute Monocytes: 493 cells/uL (ref 200–950)
Basophils Absolute: 44 cells/uL (ref 0–200)
Basophils Relative: 0.5 %
Eosinophils Absolute: 132 cells/uL (ref 15–500)
Eosinophils Relative: 1.5 %
Lymphs Abs: 4726 cells/uL — ABNORMAL HIGH (ref 850–3900)
MCH: 31.5 pg (ref 27.0–33.0)
MCHC: 33.4 g/dL (ref 32.0–36.0)
Monocytes Relative: 5.6 %
Neutro Abs: 3406 cells/uL (ref 1500–7800)
Platelets: 208 10*3/uL (ref 140–400)
RBC: 4.47 10*6/uL (ref 4.20–5.80)
RDW: 12.3 % (ref 11.0–15.0)
Total Lymphocyte: 53.7 %
WBC: 8.8 10*3/uL (ref 3.8–10.8)

## 2022-06-14 LAB — MICROSCOPIC MESSAGE

## 2022-07-19 ENCOUNTER — Other Ambulatory Visit: Payer: Self-pay | Admitting: Nurse Practitioner

## 2022-07-19 DIAGNOSIS — G4700 Insomnia, unspecified: Secondary | ICD-10-CM

## 2022-08-07 ENCOUNTER — Ambulatory Visit: Payer: PPO | Admitting: Nurse Practitioner

## 2022-08-07 ENCOUNTER — Other Ambulatory Visit (HOSPITAL_COMMUNITY): Payer: Self-pay

## 2022-08-14 ENCOUNTER — Encounter: Payer: Self-pay | Admitting: Nurse Practitioner

## 2022-08-14 ENCOUNTER — Ambulatory Visit (INDEPENDENT_AMBULATORY_CARE_PROVIDER_SITE_OTHER): Payer: PPO | Admitting: Nurse Practitioner

## 2022-08-14 VITALS — BP 142/88 | HR 67 | Temp 97.4°F | Ht 64.5 in | Wt 153.6 lb

## 2022-08-14 DIAGNOSIS — R6889 Other general symptoms and signs: Secondary | ICD-10-CM

## 2022-08-14 DIAGNOSIS — J449 Chronic obstructive pulmonary disease, unspecified: Secondary | ICD-10-CM | POA: Diagnosis not present

## 2022-08-14 DIAGNOSIS — E663 Overweight: Secondary | ICD-10-CM

## 2022-08-14 DIAGNOSIS — Z8546 Personal history of malignant neoplasm of prostate: Secondary | ICD-10-CM

## 2022-08-14 DIAGNOSIS — Z79899 Other long term (current) drug therapy: Secondary | ICD-10-CM

## 2022-08-14 DIAGNOSIS — Z85118 Personal history of other malignant neoplasm of bronchus and lung: Secondary | ICD-10-CM

## 2022-08-14 DIAGNOSIS — Z8551 Personal history of malignant neoplasm of bladder: Secondary | ICD-10-CM

## 2022-08-14 DIAGNOSIS — E559 Vitamin D deficiency, unspecified: Secondary | ICD-10-CM

## 2022-08-14 DIAGNOSIS — H900 Conductive hearing loss, bilateral: Secondary | ICD-10-CM

## 2022-08-14 DIAGNOSIS — Z87891 Personal history of nicotine dependence: Secondary | ICD-10-CM

## 2022-08-14 DIAGNOSIS — Z0000 Encounter for general adult medical examination without abnormal findings: Secondary | ICD-10-CM

## 2022-08-14 DIAGNOSIS — G4700 Insomnia, unspecified: Secondary | ICD-10-CM

## 2022-08-14 DIAGNOSIS — I10 Essential (primary) hypertension: Secondary | ICD-10-CM

## 2022-08-14 DIAGNOSIS — R7309 Other abnormal glucose: Secondary | ICD-10-CM | POA: Diagnosis not present

## 2022-08-14 DIAGNOSIS — I700 Atherosclerosis of aorta: Secondary | ICD-10-CM

## 2022-08-14 DIAGNOSIS — Z0001 Encounter for general adult medical examination with abnormal findings: Secondary | ICD-10-CM | POA: Diagnosis not present

## 2022-08-14 DIAGNOSIS — E782 Mixed hyperlipidemia: Secondary | ICD-10-CM | POA: Diagnosis not present

## 2022-08-14 LAB — CBC WITH DIFFERENTIAL/PLATELET
Eosinophils Relative: 1.3 %
HCT: 42.7 % (ref 38.5–50.0)
Lymphs Abs: 5587 cells/uL — ABNORMAL HIGH (ref 850–3900)
MCH: 31.4 pg (ref 27.0–33.0)
MCV: 93 fL (ref 80.0–100.0)
RDW: 12.6 % (ref 11.0–15.0)
WBC: 9.7 10*3/uL (ref 3.8–10.8)

## 2022-08-14 NOTE — Patient Instructions (Signed)

## 2022-08-14 NOTE — Progress Notes (Signed)
MEDICARE ANNUAL WELLNESS VISIT AND FOLLOW UP Assessment:   Diagnoses and all orders for this visit:  Encounter for Medicare annual wellness exam Due annually  Health maintenance reviewed Healthily lifestyle goals set  Aortic atherosclerosis (HCC) Per CT 03/2018 Control blood pressure, cholesterol, glucose, increase exercise.   COPD Controlled  Monitor symptoms  Essential hypertension Discussed DASH (Dietary Approaches to Stop Hypertension) DASH diet is lower in sodium than a typical American diet. Cut back on foods that are high in saturated fat, cholesterol, and trans fats. Eat more whole-grain foods, fish, poultry, and nuts Remain active and exercise as tolerated daily.  Monitor BP at home-Call if greater than 130/80.  Check CMP/CBC  Vitamin D deficiency At goal at recent check; continue to recommend supplementation for goal of 60-100 Check vitamin D level at CPE  Other abnormal glucose Education: Reviewed 'ABCs' of diabetes management  Discussed goals to be met and/or maintained include A1C (<7) Blood pressure (<130/80) Cholesterol (LDL <70) Continue Eye Exam yearly  Continue Dental Exam Q6 mo Discussed dietary recommendations Discussed Physical Activity recommendations Check A1C  Hyperlipidemia Discussed lifestyle modifications. Recommended diet heavy in fruits and veggies, omega 3's. Decrease consumption of animal meats, cheeses, and dairy products. Remain active and exercise as tolerated. Continue to monitor. Check lipids/TSH  Overweight (BMI 25.0-29.9) Discussed appropriate BMI Diet modification. Physical activity. Encouraged/praised to build confidence.  Medication management All medications discussed and reviewed in full. All questions and concerns regarding medications addressed.    History of lung cancer/Hx of smoking S/p VATS RUL lobectomy, 10 years without recurrence; Released by Dr. Shirline Frees Following annual CXR, UTD  Hx of  bladder/prostate cancer Urinary symptoms controlled Followed by Dr. Retta Diones  Hearing loss, bilateral Continue hearing aids  No recent changes  Insomnia Resolved  Orders Placed This Encounter  Procedures   CBC with Differential/Platelet   COMPLETE METABOLIC PANEL WITH GFR   Lipid panel   Hemoglobin A1c    Notify office for further evaluation and treatment, questions or concerns if any reported s/s fail to improve.   The patient was advised to call back or seek an in-person evaluation if any symptoms worsen or if the condition fails to improve as anticipated.   Further disposition pending results of labs. Discussed med's effects and SE's.    I discussed the assessment and treatment plan with the patient. The patient was provided an opportunity to ask questions and all were answered. The patient agreed with the plan and demonstrated an understanding of the instructions.  Discussed med's effects and SE's. Screening labs and tests as requested with regular follow-up as recommended.  I provided 35 minutes of face-to-face time during this encounter including counseling, chart review, and critical decision making was preformed.   Future Appointments  Date Time Provider Department Center  08/15/2022  7:35 AM Sherrie George, MD [facility] None  11/15/2022 10:30 AM Lucky Cowboy, MD [facility] None  02/21/2023  9:30 AM Adela Glimpse, NP [facility] None  05/22/2023 11:00 AM Lucky Cowboy, MD [facility] None  08/14/2023 10:30 AM Adela Glimpse, NP [facility] None     Plan:   During the course of the visit the patient was educated and counseled about appropriate screening and preventive services including:   Pneumococcal vaccine  Influenza vaccine Prevnar 13 Td vaccine Screening electrocardiogram Colorectal cancer screening Diabetes screening Glaucoma screening Nutrition counseling    Subjective:  Raymond Patrick is a 87 y.o. male who presents for Medicare Annual  Wellness Visit and 3 month follow up  for HTN, hyperlipidemia, glucose management, and vitamin D Def.   Overall he reports feeling well.  He has not new or additional concerns in clinic today.  Patient is s/p RUL lobectomy by VATS  for Primary Lung Cancer in 2008 followed by Dr Arbutus Ped (has since been released, doing annual CXR, last in March 2021) and also is s/p TURP for Prostate Cancer & Bladder cancer in 2013 by Dr Retta Diones, continues to follow up with him doing annual cysto, recently negative in 03/2018. Also follows with hx of kidney stones, had lithotripsy last year without problems since.   Reports some difficulty sleeping in the last year, has been having trouble falling asleep. Will fall asleep at 1 AM and wakes up at 6 am. Thought to be d/t TSH levels too low.  Now WNL and sleeping better.  Enjoying being more social, trying to get out more.   BMI is Body mass index is 25.96 kg/m., he has been working on diet and exercise.  Wt Readings from Last 3 Encounters:  08/14/22 153 lb 9.6 oz (69.7 kg)  06/13/22 153 lb 3.2 oz (69.5 kg)  05/04/22 156 lb 6.4 oz (70.9 kg)   He has abdominal aortic atherosclerosis per Ct 03/2018.  His blood pressure has been controlled at home, today their BP is BP: (!) 142/88 He does workout. He denies chest pain, shortness of breath, dizziness.   He is on cholesterol medication (rosuvastatin 10 mg every other day, he has been on pravastatin and lipitor in the past) and denies myalgias. His cholesterol is at goal. The cholesterol last visit was:   Lab Results  Component Value Date   CHOL 138 05/04/2022   HDL 49 05/04/2022   LDLCALC 73 05/04/2022   TRIG 79 05/04/2022   CHOLHDL 2.8 05/04/2022   He has been working on diet and exercise for glucose management, and denies foot ulcerations, increased appetite, nausea, paresthesia of the feet, polydipsia, polyuria, visual disturbances and vomiting. Last A1C in the office was:  Lab Results  Component Value Date    HGBA1C 5.6 05/04/2022    Last GFR:  Lab Results  Component Value Date   GFRNONAA 82 04/15/2020    Patient is on Vitamin D supplement and at goal at recent check:   Lab Results  Component Value Date   VD25OH 73 05/04/2022      Medication Review: Current Outpatient Medications on File Prior to Visit  Medication Sig Dispense Refill   amitriptyline (ELAVIL) 50 MG tablet TAKE 1 TABLET(50 MG) BY MOUTH AT BEDTIME AS NEEDED FOR SLEEP 30 tablet 0   aspirin 81 MG tablet Take 81 mg by mouth daily.     busPIRone (BUSPAR) 10 MG tablet Take 1/2 to 1 tablet  3 x /day for Anxiety 90 tablet 1   Cholecalciferol (VITAMIN D) 2000 UNITS CAPS Take 4,000 Units by mouth daily.     doxycycline (VIBRAMYCIN) 100 MG capsule Take 1 capsule twice daily with food 20 capsule 0   Multiple Vitamin (MULTIVITAMIN WITH MINERALS) TABS Take 1 tablet by mouth every evening.      Omega-3 Fatty Acids (FISH OIL) 1000 MG CAPS Take 1 capsule by mouth 2 (two) times a week.     rosuvastatin (CRESTOR) 10 MG tablet Take  1 tablet  every other day (on even days)  for Cholesterol / Patient knows to take by mouth 45 tablet 3   vitamin C (ASCORBIC ACID) 500 MG tablet Take 500 mg by mouth daily.  zinc gluconate 50 MG tablet Take 50 mg by mouth daily.     No current facility-administered medications on file prior to visit.    Allergies: Allergies  Allergen Reactions   Nitrofuran Derivatives Shortness Of Breath    macrobid    Current Problems (verified) has Hyperlipidemia; History of smoking; Hypertension; Vitamin D deficiency; Abnormal glucose; History of lung cancer; Overweight (BMI 25.0-29.9); BPH (benign prostatic hyperplasia); Aortic atherosclerosis (HCC) by Abd  CTscan on 03/18/2018; Bilateral hearing loss; and Insomnia on their problem list.  Screening Tests Health Maintenance  Topic Date Due   Zoster Vaccines- Shingrix (1 of 2) Never done   DTaP/Tdap/Td (2 - Td or Tdap) 03/09/2020   Colonoscopy  12/10/2021    COVID-19 Vaccine (6 - 2023-24 season) 02/11/2022   INFLUENZA VACCINE  09/07/2022   Medicare Annual Wellness (AWV)  08/14/2023   Pneumonia Vaccine 72+ Years old  Completed   HPV VACCINES  Aged Out   Preventative care: Last colonoscopy: 12/2018, polyps, DONE per GI due to age   Prior vaccinations: TD or Tdap: 2012  Influenza: 2023  Pneumococcal: 2004, 2014 Prevnar13: 2015 Shingles/Zostavax: 2016 Covid 19: 2/2, 2021, pfizer   Names of Other Physician/Practitioners you currently use: 1. [place] here for primary care 2. Dr. Gaspar Bidding, eye doctor, last visit 2024, mild Left cataract, has has L macular edema followed by Dr. Ashley Royalty q7-8 weeks, doing injections 3. [employer], dentist, last visit 07/2021  Patient Care Team: Lucky Cowboy, MD as PCP - General (Internal Medicine) Marcine Matar, MD as Consulting Physician (Urology) Si Gaul, MD as Consulting Physician (Oncology) Runell Gess, MD as Consulting Physician (Cardiology) Louis Meckel, MD (Inactive) as Consulting Physician (Gastroenterology) Manning Charity, OD as Referring Physician (Optometry) Charlett Nose, [facility] (Inactive) as Pharmacist (Pharmacist)  Surgical: He  has a past surgical history that includes Lung removal, partial (2008); Lithotripsy (2012); Fiberoptic bronchoscopy (10-28-2008); RIGHT UPPER LUNG LOBECTOMY (09-21-2006  DR Edwyna Shell); Transurethral resection of prostate (12/25/2011); Transurethral resection of bladder tumor (12/25/2011); Prostate surgery; Colonoscopy (05-09-2004); and Extracorporeal shock wave lithotripsy (Left, 04/18/2018). Family His family history includes Cancer in his father and mother; Cancer (age of onset: 87) in his brother; Cancer (age of onset: 45) in his brother; Colon cancer in his father; Colon polyps in his mother; Diabetes in his maternal grandmother and mother; Heart failure in his maternal grandmother; Hypertension in  his mother; Pneumonia in his maternal grandfather. Social history  He reports that he quit smoking about 18 years ago. His smoking use included cigarettes. He has a 35.00 pack-year smoking history. He has never used smokeless tobacco. He reports current alcohol use. He reports that he does not use drugs.  MEDICARE WELLNESS OBJECTIVES: Physical activity: Current Exercise Habits: Home exercise routine Cardiac risk factors:   Depression/mood screen:      08/14/2022   11:17 AM  Depression screen PHQ 2/9  Decreased Interest 0  Down, Depressed, Hopeless 0  PHQ - 2 Score 0    ADLs:     08/14/2022   11:17 AM 02/01/2022   10:21 PM  In your present state of health, do you have any difficulty performing the following activities:  Hearing? 0 0  Vision? 0 0  Difficulty concentrating or making decisions? 0 0  Walking or climbing stairs? 0 0  Dressing or bathing? 0 0  Doing errands, shopping? 0 0     Cognitive Testing  Alert? Yes  Normal Appearance?Yes  Oriented to person? Yes  Place?  Yes   Time? Yes  Recall of three objects?  Yes  Can perform simple calculations? Yes  Displays appropriate judgment?Yes  Can read the correct time from a watch face?Yes  EOL planning: Does Patient Have a Medical Advance Directive?: Yes Type of Advance Directive: Living will  Review of Systems  Constitutional:  Negative for chills, fever and weight loss.  HENT:  Positive for tinnitus. Negative for congestion and hearing loss.   Eyes:  Negative for blurred vision and double vision.  Respiratory:  Negative for cough and shortness of breath.   Cardiovascular:  Negative for chest pain, palpitations, orthopnea and leg swelling.  Gastrointestinal:  Negative for abdominal pain, constipation, diarrhea, heartburn, nausea and vomiting.  Genitourinary:  Negative for dysuria.  Musculoskeletal:  Negative for falls, joint pain and myalgias.  Skin:  Negative for rash.  Neurological:  Negative for dizziness, tingling,  tremors, loss of consciousness and headaches.  Endo/Heme/Allergies:  Bruises/bleeds easily.  Psychiatric/Behavioral:  Positive for memory loss (difficulty remebering things quickly). Negative for depression and suicidal ideas. The patient has insomnia.     Objective:   Today's Vitals   08/14/22 1030  BP: (!) 142/88  Pulse: 67  Temp: (!) 97.4 F (36.3 C)  SpO2: 95%  Weight: 153 lb 9.6 oz (69.7 kg)  Height: 5' 4.5" (1.638 m)    Body mass index is 25.96 kg/m.  General appearance: alert, no distress, WD/WN, male HEENT: normocephalic, sclerae anicteric, Bil ear canels clear, hearing aids, nares patent, no discharge or erythema, pharynx normal Oral cavity: MMM, no lesions Neck: supple, no lymphadenopathy, no thyromegaly, no masses Heart: RRR, normal S1, S2, no murmurs Lungs: CTA bilaterally, no wheezes, rhonchi, or rales Abdomen: +bs, soft, non tender, non distended, no masses, no hepatomegaly, no splenomegaly Musculoskeletal: nontender, no swelling, no obvious deformity Extremities: no edema, no cyanosis, no clubbing Pulses: 2+ symmetric, upper and lower extremities, normal cap refill Neurological: alert, oriented x 3, CN2-12 intact, strength normal upper extremities and lower extremities, sensation normal throughout, DTRs 2+ throughout, no cerebellar signs, gait normal Psychiatric: normal affect, behavior normal, pleasant . Oriented x 3 . 3 word recall done appropriately.  Medicare Attestation I have personally reviewed: The patient's medical and social history Their use of alcohol, tobacco or illicit drugs Their current medications and supplements The patient's functional ability including ADLs,fall risks, home safety risks, cognitive, and hearing and visual impairment Diet and physical activities Evidence for depression or mood disorders  The patient's weight, height, BMI, and visual acuity have been recorded in the chart.  I have made referrals, counseling, and provided  education to the patient based on review of the above and I have provided the patient with a written personalized care plan for preventive services.     Adela Glimpse, NP   08/14/2022

## 2022-08-15 ENCOUNTER — Encounter (INDEPENDENT_AMBULATORY_CARE_PROVIDER_SITE_OTHER): Payer: PPO | Admitting: Ophthalmology

## 2022-08-15 DIAGNOSIS — I10 Essential (primary) hypertension: Secondary | ICD-10-CM

## 2022-08-15 DIAGNOSIS — H34812 Central retinal vein occlusion, left eye, with macular edema: Secondary | ICD-10-CM

## 2022-08-15 DIAGNOSIS — H35033 Hypertensive retinopathy, bilateral: Secondary | ICD-10-CM

## 2022-08-15 DIAGNOSIS — H43813 Vitreous degeneration, bilateral: Secondary | ICD-10-CM | POA: Diagnosis not present

## 2022-08-15 LAB — CBC WITH DIFFERENTIAL/PLATELET
Absolute Monocytes: 514 cells/uL (ref 200–950)
Basophils Absolute: 78 cells/uL (ref 0–200)
Basophils Relative: 0.8 %
Eosinophils Absolute: 126 cells/uL (ref 15–500)
Hemoglobin: 14.4 g/dL (ref 13.2–17.1)
MCHC: 33.7 g/dL (ref 32.0–36.0)
MPV: 10.7 fL (ref 7.5–12.5)
Monocytes Relative: 5.3 %
Neutro Abs: 3395 cells/uL (ref 1500–7800)
Neutrophils Relative %: 35 %
Platelets: 187 10*3/uL (ref 140–400)
RBC: 4.59 10*6/uL (ref 4.20–5.80)
Total Lymphocyte: 57.6 %

## 2022-08-15 LAB — COMPLETE METABOLIC PANEL WITH GFR
AG Ratio: 1.9 (calc) (ref 1.0–2.5)
ALT: 19 U/L (ref 9–46)
AST: 23 U/L (ref 10–35)
Albumin: 4.3 g/dL (ref 3.6–5.1)
Alkaline phosphatase (APISO): 78 U/L (ref 35–144)
BUN: 10 mg/dL (ref 7–25)
CO2: 29 mmol/L (ref 20–32)
Calcium: 9.7 mg/dL (ref 8.6–10.3)
Chloride: 106 mmol/L (ref 98–110)
Creat: 0.91 mg/dL (ref 0.70–1.22)
Globulin: 2.3 g/dL (calc) (ref 1.9–3.7)
Glucose, Bld: 98 mg/dL (ref 65–99)
Potassium: 4.3 mmol/L (ref 3.5–5.3)
Sodium: 142 mmol/L (ref 135–146)
Total Bilirubin: 0.9 mg/dL (ref 0.2–1.2)
Total Protein: 6.6 g/dL (ref 6.1–8.1)
eGFR: 83 mL/min/{1.73_m2} (ref >=60)

## 2022-08-15 LAB — LIPID PANEL
Cholesterol: 145 mg/dL (ref <200)
HDL: 48 mg/dL (ref >=40)
LDL Cholesterol (Calc): 77 mg/dL (calc)
Non-HDL Cholesterol (Calc): 97 mg/dL (calc) (ref <130)
Total CHOL/HDL Ratio: 3 (calc) (ref <5.0)
Triglycerides: 113 mg/dL (ref <150)

## 2022-08-15 LAB — HEMOGLOBIN A1C
Hgb A1c MFr Bld: 5.7 % of total Hgb — ABNORMAL HIGH (ref <5.7)
Mean Plasma Glucose: 117 mg/dL
eAG (mmol/L): 6.5 mmol/L

## 2022-10-24 ENCOUNTER — Encounter (INDEPENDENT_AMBULATORY_CARE_PROVIDER_SITE_OTHER): Payer: PPO | Admitting: Ophthalmology

## 2022-11-06 ENCOUNTER — Encounter (INDEPENDENT_AMBULATORY_CARE_PROVIDER_SITE_OTHER): Payer: PPO | Admitting: Ophthalmology

## 2022-11-06 DIAGNOSIS — H43813 Vitreous degeneration, bilateral: Secondary | ICD-10-CM

## 2022-11-06 DIAGNOSIS — H34812 Central retinal vein occlusion, left eye, with macular edema: Secondary | ICD-10-CM | POA: Diagnosis not present

## 2022-11-06 DIAGNOSIS — H2513 Age-related nuclear cataract, bilateral: Secondary | ICD-10-CM | POA: Diagnosis not present

## 2022-11-06 DIAGNOSIS — I10 Essential (primary) hypertension: Secondary | ICD-10-CM | POA: Diagnosis not present

## 2022-11-06 DIAGNOSIS — H35033 Hypertensive retinopathy, bilateral: Secondary | ICD-10-CM

## 2022-11-15 ENCOUNTER — Encounter: Payer: Self-pay | Admitting: Internal Medicine

## 2022-11-15 ENCOUNTER — Ambulatory Visit (INDEPENDENT_AMBULATORY_CARE_PROVIDER_SITE_OTHER): Payer: PPO | Admitting: Internal Medicine

## 2022-11-15 VITALS — BP 136/72 | HR 60 | Temp 97.3°F | Resp 16 | Ht 64.5 in | Wt 153.8 lb

## 2022-11-15 DIAGNOSIS — R7309 Other abnormal glucose: Secondary | ICD-10-CM

## 2022-11-15 DIAGNOSIS — Z79899 Other long term (current) drug therapy: Secondary | ICD-10-CM

## 2022-11-15 DIAGNOSIS — E782 Mixed hyperlipidemia: Secondary | ICD-10-CM

## 2022-11-15 DIAGNOSIS — Z23 Encounter for immunization: Secondary | ICD-10-CM | POA: Diagnosis not present

## 2022-11-15 DIAGNOSIS — I10 Essential (primary) hypertension: Secondary | ICD-10-CM | POA: Diagnosis not present

## 2022-11-15 DIAGNOSIS — E559 Vitamin D deficiency, unspecified: Secondary | ICD-10-CM

## 2022-11-15 NOTE — Patient Instructions (Signed)

## 2022-11-15 NOTE — Progress Notes (Signed)
---    Future Appointments  Date Time Provider Department  11/15/2022                   6 mo ov 10:30 AM Lucky Cowboy, MD [facility]  02/12/2023  8:20 AM Sherrie George, MD [facility]  02/21/2023                    9 mo ov  9:30 AM Adela Glimpse, NP [facility]  05/22/2023                    cpe 11:00 AM Lucky Cowboy, MD [facility]  08/14/2023                   wellness 10:30 AM Adela Glimpse, NP [facility]    History of Present Illness:      This very nice 87 y.o. single WM presents for 9 month follow up with HTN, HLD, Pre-Diabetes, COPD  and Vitamin D Deficiency. Abd CT scan in  2020 revealed Aortic Atherosclerosis.        Patient is treated for HTN  since 1998   & BP has been controlled at home. Today's BP is at goal - 136/72. Patient has had no complaints of any cardiac type chest pain, palpitations, dyspnea Pollyann Kennedy /PND, dizziness, claudication or dependent edema.   Abd CT scan in Feb 2020 revealed Aortic Atherosclerosis.       Hyperlipidemia is controlled with diet & Rosuvastatin.   Patient denies myalgias or other med SE's. Last Lipids were  at goal , but patient relates that he has "forgotten " to take his Rosuvastatin for 3 months !  Lab Results  Component Value Date   CHOL 145 08/14/2022   HDL 48 08/14/2022   LDLCALC 77 08/14/2022   TRIG 113 08/14/2022   CHOLHDL 3.0 08/14/2022     Also, the patient has history of PreDiabetes  (A1c = 6.2% /2011) and has had no symptoms of reactive hypoglycemia, diabetic polys, paresthesias or visual blurring.  Last A1c was at goal :  Lab Results  Component Value Date   HGBA1C 5.7 (H) 08/14/2022                                                      Further, the patient also has history of Vitamin D Deficiency ("20" /2009)  and supplements vitamin D. Last vitamin D was at goal :  Lab Results  Component Value Date   VD25OH 73 05/04/2022       Current Outpatient Medications on File Prior to Visit  Medication Sig    aspirin 81 MG tablet Take daily.   VITAMIN D 2000 UNITS CAPS Take 4,000 Units  daily.   Multiple Vitamin  Take 1 tablet every evening.    Omega-3 FISH OIL 1000 MG CAPS Take 1 capsule  2  times a week.   rosuvastatin  10 MG tablet Take  1 tablet  every other day (on even days)    vitamin C 500 MG tablet Take  daily.   zinc  50 MG tablet Take  daily.     Medications   rosuvastatin (CRESTOR) 10 MG tablet, Take  1 tablet  every other day (on even days)   aspirin 81 MG tablet, Take  daily.   Cholecalciferol (VITAMIN D)  2000 UNITS CAPS, Take 4,000 Units  daily.   Multiple Vitamin (, Take 1 tablet by mouth every evening.    Omega-3 FISH OIL 1000 MG CAPS, Take 1 capsule by mouth 2 (two) times a week.   vitamin C 500 MG tablet, Take  daily.   zinc  50 MG tablet, Take 5 daily.   Allergies  Allergen Reactions   Nitrofuran Derivatives Shortness Of Breath    macrobid     PMHx:   Past Medical History:  Diagnosis Date   Allergy    seasonal   Bladder tumor    Cancer (HCC)    bladder,, lung   COPD (chronic obstructive pulmonary disease) (HCC)    Diverticulosis of sigmoid colon    2016   History of kidney stones    History o lung cancer 2008  S/P RUL    STAGE I  NON-SMALL CELL LUNG CANCER--  ONCOLOGIST- DR Arbutus Ped-   NO RECURRENCE   Hyperlipidemia    Hypertension    Impaired hearing bilateral aids   Irregular heart rate    per pt heart will skip a beat    Kidney stone 07/08/2019   vitamin D deficiency      Immunization History  Administered Date(s) Administered   Influenza, High Dose Seasonal PF 11/03/2014, 11/02/2016, 11/28/2017, 11/30/2018, 10/28/2020   Influenza,inj,Quad PF,6+ Mos 11/09/2015   Influenza 11/06/2012, 12/17/2021   PFIZER  SARS-COV-2 Vacc 02/27/2019, 03/19/2019, 01/09/2020   Pfizer Covid-19 Vaccine Bivalent Booster 48yrs & up 12/27/2020, 12/17/2021   Pneumococcal -13 01/20/2014   Pneumococcal -23 04/25/2021   Pneumococcal -23 02/06/2002, 09/23/2012   Tdap  03/09/2010   Zoster, Live 08/03/2014     Past Surgical History:  Procedure Laterality Date   COLONOSCOPY  05-09-2004   HPP and tics Kaplan    EXTRACORPOREAL SHOCK WAVE LITHOTRIPSY Left 04/18/2018   Procedure: EXTRACORPOREAL SHOCK WAVE LITHOTRIPSY (ESWL);  Surgeon: Jerilee Field, MD;  Location: [facility];  Service: Urology;  Laterality: Left;   FIBEROPTIC BRONCHOSCOPY  10-28-2008   LITHOTRIPSY  2012   LUNG REMOVAL, PARTIAL  2008   PROSTATE SURGERY     prostatectomy   RIGHT UPPER LUNG LOBECTOMY  09-21-2006  DR BURNEY   NON-SMALL CELL CANCER/ SEVERE COPD   TRANSURETHRAL RESECTION OF BLADDER TUMOR  12/25/2011   Procedure: TRANSURETHRAL RESECTION OF BLADDER TUMOR (TURBT);  Surgeon: Marcine Matar, MD;  Location: [facility];  Service: Urology;  Laterality: N/A;   TRANSURETHRAL RESECTION OF PROSTATE  12/25/2011   Procedure: TRANSURETHRAL RESECTION OF THE PROSTATE WITH GYRUS INSTRUMENTS;  Surgeon: Marcine Matar, MD;  Location[facility];  Service: Urology;  Laterality: N/A;  2 HRS     FHx:    Reviewed / unchanged  SHx:    Reviewed / unchanged   Systems Review:  Constitutional: Denies fever, chills, wt changes, headaches, insomnia, fatigue, night sweats, change in appetite. Eyes: Denies redness, blurred vision, diplopia, discharge, itchy, watery eyes.  ENT: Denies discharge, congestion, post nasal drip, epistaxis, sore throat, earache, hearing loss, dental pain, tinnitus, vertigo, sinus pain, snoring.  CV: Denies chest pain, palpitations, irregular heartbeat, syncope, dyspnea, diaphoresis, orthopnea, PND, claudication or edema. Respiratory: denies cough, dyspnea, DOE, pleurisy, hoarseness, laryngitis, wheezing.  Gastrointestinal: Denies dysphagia, odynophagia, heartburn, reflux, water brash, abdominal pain or cramps, nausea, vomiting, bloating, diarrhea, constipation, hematemesis, melena, hematochezia  or hemorrhoids. Genitourinary: Denies dysuria,  frequency, urgency, nocturia, hesitancy, discharge, hematuria or flank pain. Musculoskeletal: Denies arthralgias, myalgias, stiffness, jt. swelling, pain, limping or strain/sprain.  Skin: Denies pruritus, rash, hives, warts, acne, eczema or change in skin lesion(s). Neuro: No weakness, tremor, incoordination, spasms, paresthesia or pain. Psychiatric: Denies confusion, memory loss or sensory loss. Endo: Denies change in weight, skin or hair change.  Heme/Lymph: No excessive bleeding, bruising or enlarged lymph nodes.  Physical Exam  BP 136/72   Pulse 60   Temp (!) 97.3 F (36.3 C)   Resp 16   Ht 5' 4.5" (1.638 m)   Wt 153 lb 12.8 oz (69.8 kg)   SpO2 95%   BMI 25.99 kg/m   Appears  well nourished, well groomed  and in no distress.  Eyes: PERRLA, EOMs, conjunctiva no swelling or erythema. Sinuses: No frontal/maxillary tenderness ENT/Mouth: EAC's clear, TM's nl w/o erythema, bulging. Nares clear w/o erythema, swelling, exudates. Oropharynx clear without erythema or exudates. Oral hygiene is good. Tongue normal, non obstructing. Hearing intact.  Neck: Supple. Thyroid not palpable. Car 2+/2+ without bruits, nodes or JVD. Chest: Respirations nl with BS clear & equal w/o rales, rhonchi, wheezing or stridor.  Cor: Heart sounds normal w/ regular rate and rhythm without sig. murmurs, gallops, clicks or rubs. Peripheral pulses normal and equal  without edema.  Abdomen: Soft & bowel sounds normal. Non-tender w/o guarding, rebound, hernias, masses or organomegaly.  Lymphatics: Unremarkable.  Musculoskeletal: Full ROM all peripheral extremities, joint stability, 5/5 strength and normal gait.  Skin: Warm, dry without exposed rashes, lesions or ecchymosis apparent.  Neuro: Cranial nerves intact, reflexes equal bilaterally. Sensory-motor testing grossly intact. Tendon reflexes grossly intact.  Pysch: Alert & oriented x 3.  Insight and judgement nl & appropriate. No ideations.  Assessment and  Plan:  1. Essential hypertension  - Continue medication, monitor blood pressure at home.  - Continue DASH diet.  Reminder to go to the ER if any CP,  SOB, nausea, dizziness, severe HA, changes vision/speech.    - Magnesium     2. Hyperlipidemia, mixed  - Continue diet/meds, exercise, & lifestyle modifications.  - Continue monitor periodic cholesterol/liver & renal functions      3. Abnormal glucose  - Continue diet, exercise  - Lifestyle modifications - Monitor appropriate labs   - Insulin, random   4. Vitamin D deficiency  - Continue supplementation  - VITAMIN D 25 Hydroxy    5. Medication management  - Magnesium - Insulin, random - VITAMIN D 25 Hydroxy           Discussed  regular exercise, BP monitoring, weight control to achieve/maintain BMI less than 25 and discussed med and SE's. Recommended labs to assess /monitor clinical status .  I discussed the assessment and treatment plan with the patient. The patient was provided an opportunity to ask questions and all were answered. The patient agreed with the plan and demonstrated an understanding of the instructions.  I provided over 30 minutes of exam, counseling, chart review and  complex critical decision making.        The patient was advised to call back or seek an in-person evaluation if the symptoms worsen or if the condition fails to improve as anticipated.   Marinus Maw, MD

## 2022-11-16 ENCOUNTER — Other Ambulatory Visit: Payer: Self-pay | Admitting: Internal Medicine

## 2022-11-16 DIAGNOSIS — E782 Mixed hyperlipidemia: Secondary | ICD-10-CM

## 2022-11-16 LAB — TSH: TSH: 1.38 m[IU]/L (ref 0.40–4.50)

## 2022-11-16 LAB — CBC WITH DIFFERENTIAL/PLATELET
Absolute Monocytes: 583 {cells}/uL (ref 200–950)
Basophils Absolute: 70 {cells}/uL (ref 0–200)
Basophils Relative: 0.8 %
Eosinophils Absolute: 261 {cells}/uL (ref 15–500)
Eosinophils Relative: 3 %
HCT: 44.3 % (ref 38.5–50.0)
Hemoglobin: 14.5 g/dL (ref 13.2–17.1)
Lymphs Abs: 4628 {cells}/uL — ABNORMAL HIGH (ref 850–3900)
MCH: 31.4 pg (ref 27.0–33.0)
MCHC: 32.7 g/dL (ref 32.0–36.0)
MCV: 95.9 fL (ref 80.0–100.0)
MPV: 10.5 fL (ref 7.5–12.5)
Monocytes Relative: 6.7 %
Neutro Abs: 3158 {cells}/uL (ref 1500–7800)
Neutrophils Relative %: 36.3 %
Platelets: 221 10*3/uL (ref 140–400)
RBC: 4.62 10*6/uL (ref 4.20–5.80)
RDW: 12.2 % (ref 11.0–15.0)
Total Lymphocyte: 53.2 %
WBC: 8.7 10*3/uL (ref 3.8–10.8)

## 2022-11-16 LAB — COMPLETE METABOLIC PANEL WITH GFR
AG Ratio: 1.9 (calc) (ref 1.0–2.5)
ALT: 17 U/L (ref 9–46)
AST: 15 U/L (ref 10–35)
Albumin: 4.3 g/dL (ref 3.6–5.1)
Alkaline phosphatase (APISO): 79 U/L (ref 35–144)
BUN: 13 mg/dL (ref 7–25)
CO2: 30 mmol/L (ref 20–32)
Calcium: 9.9 mg/dL (ref 8.6–10.3)
Chloride: 105 mmol/L (ref 98–110)
Creat: 1.02 mg/dL (ref 0.70–1.22)
Globulin: 2.3 g/dL (ref 1.9–3.7)
Glucose, Bld: 90 mg/dL (ref 65–99)
Potassium: 4.6 mmol/L (ref 3.5–5.3)
Sodium: 141 mmol/L (ref 135–146)
Total Bilirubin: 0.6 mg/dL (ref 0.2–1.2)
Total Protein: 6.6 g/dL (ref 6.1–8.1)
eGFR: 72 mL/min/{1.73_m2} (ref >=60)

## 2022-11-16 LAB — LIPID PANEL
Cholesterol: 178 mg/dL (ref <200)
HDL: 46 mg/dL (ref >=40)
LDL Cholesterol (Calc): 107 mg/dL — ABNORMAL HIGH
Non-HDL Cholesterol (Calc): 132 mg/dL — ABNORMAL HIGH (ref <130)
Total CHOL/HDL Ratio: 3.9 (calc) (ref <5.0)
Triglycerides: 132 mg/dL (ref <150)

## 2022-11-16 LAB — HEMOGLOBIN A1C
Hgb A1c MFr Bld: 5.8 %{Hb} — ABNORMAL HIGH (ref <5.7)
Mean Plasma Glucose: 120 mg/dL
eAG (mmol/L): 6.6 mmol/L

## 2022-11-16 LAB — INSULIN, RANDOM: Insulin: 6.9 u[IU]/mL

## 2022-11-16 LAB — MAGNESIUM: Magnesium: 2.4 mg/dL (ref 1.5–2.5)

## 2022-11-16 LAB — VITAMIN D 25 HYDROXY (VIT D DEFICIENCY, FRACTURES): Vit D, 25-Hydroxy: 63 ng/mL (ref 30–100)

## 2022-11-16 MED ORDER — ROSUVASTATIN CALCIUM 10 MG PO TABS: ORAL_TABLET | ORAL | 3 refills | Status: DC

## 2022-11-16 NOTE — Progress Notes (Signed)
<>*<>*<>*<>*<>*<>*<>*<>*<>*<>*<>*<>*<>*<>*<>*<>*<>*<>*<>*<>*<>*<>*<>*<>*<> <>*<>*<>*<>*<>*<>*<>*<>*<>*<>*<>*<>*<>*<>*<>*<>*<>*<>*<>*<>*<>*<>*<>*<>*<>  -Test results slightly outside the reference range are not unusual. If there is anything important, I will review this with you,  otherwise it is considered normal test values.  If you have further questions,  please do not hesitate to contact me at the office or via My Chart.   <>*<>*<>*<>*<>*<>*<>*<>*<>*<>*<>*<>*<>*<>*<>*<>*<>*<>*<>*<>*<>*<>*<>*<>*<> <>*<>*<>*<>*<>*<>*<>*<>*<>*<>*<>*<>*<>*<>*<>*<>*<>*<>*<>*<>*<>*<>*<>*<>*<>  -  Chol = 178 is Great , but the Bad LDL Chol = 107 is too High  !               - Please restart your Crestor  / Rosuvastatin  &                                 m             get on a better low Cholesterol diet    - Cholesterol only comes from animal sources                                                                        - ie. meat, dairy, egg yolks  - Eat all the vegetables you want.  - Avoid Meat, Avoid Meat,  Avoid Meat                                                      - especially Red Meat - Beef AND Pork .  - Avoid cheese & dairy - milk & ice cream.     - Cheese is the most concentrated form of trans-fats which                                                                              is the worst thing to clog up our arteries.    - Veggie cheese is OK which can be found in the fresh                                                produce section at [employer] or [employer] or [employer]   <>*<>*<>*<>*<>*<>*<>*<>*<>*<>*<>*<>*<>*<>*<>*<>*<>*<>*<>*<>*<>*<>*<>*<>*<> <>*<>*<>*<>*<>*<>*<>*<>*<>*<>*<>*<>*<>*<>*<>*<>*<>*<>*<>*<>*<>*<>*<>*<>*<>  -  A1c is elevated at 5.8%  ( goal or normal is less than 5.7%),  So  - Avoid Sweets, Candy & White Stuff   - White Rice, White Bland, White Flour  - Breads &   Pasta  <>*<>*<>*<>*<>*<>*<>*<>*<>*<>*<>*<>*<>*<>*<>*<>*<>*<>*<>*<>*<>*<>*<>*<>*<> <>*<>*<>*<>*<>*<>*<>*<>*<>*<>*<>*<>*<>*<>*<>*<>*<>*<>*<>*<>*<>*<>*<>*<>*<>  -  Vitamin D = 63 - Great - Please keep dosage same   <>*<>*<>*<>*<>*<>*<>*<>*<>*<>*<>*<>*<>*<>*<>*<>*<>*<>*<>*<>*<>*<>*<>*<>*<> <>*<>*<>*<>*<>*<>*<>*<>*<>*<>*<>*<>*<>*<>*<>*<>*<>*<>*<>*<>*<>*<>*<>*<>*<>  -  All Else - CBC - Kidneys - Electrolytes - Liver - Magnesium & Thyroid    - all  Normal / OK  <>*<>*<>*<>*<>*<>*<>*<>*<>*<>*<>*<>*<>*<>*<>*<>*<>*<>*<>*<>*<>*<>*<>*<>*<> <>*<>*<>*<>*<>*<>*<>*<>*<>*<>*<>*<>*<>*<>*<>*<>*<>*<>*<>*<>*<>*<>*<>*<>*<>

## 2023-02-12 ENCOUNTER — Encounter (INDEPENDENT_AMBULATORY_CARE_PROVIDER_SITE_OTHER): Payer: PPO | Admitting: Ophthalmology

## 2023-02-21 ENCOUNTER — Ambulatory Visit: Payer: PPO | Admitting: Nurse Practitioner

## 2023-02-24 IMAGING — MR MR LUMBAR SPINE W/O CM
4 of 5 series · 25 of 48 positions shown · non-contrast
Comparison: CT abdomen/pelvis 02/03/2020.

CLINICAL DATA: Left sciatic nerve pain. Low back pain, greater than
6 weeks; low back pain, progressive neurologic deficit. Additional
history provided by scanning technologist: Patient reports left leg
pain and weakness since March 2020.

EXAM:
MRI LUMBAR SPINE WITHOUT CONTRAST
TECHNIQUE: Multiplanar, multisequence MR imaging of the lumbar spine was
performed. No intravenous contrast was administered.

[Series 3: T2 · sagittal · 4.0mm · 1.09mm/px · 6 of 23 slices shown (1 of 2)]
[im 1/23]
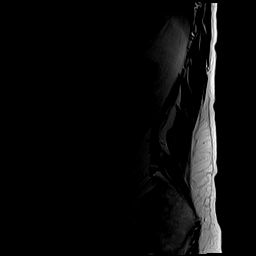
[im 5/23]
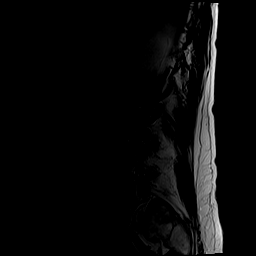
[im 9/23]
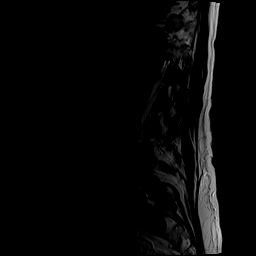
[im 14/23]
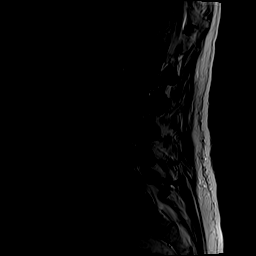
[im 18/23]
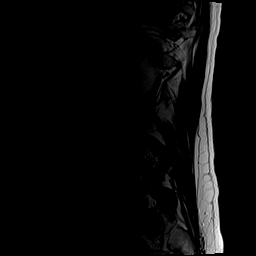
[im 23/23]
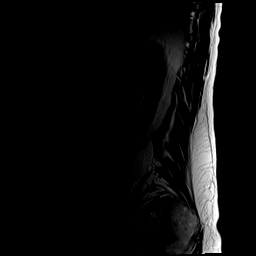

[Series 5: T1 · sagittal · 4.0mm · 1.09mm/px · 7 of 23 slices shown (1 of 2)]
[im 1/23]
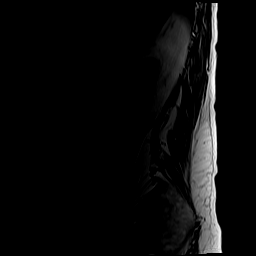
[im 4/23]
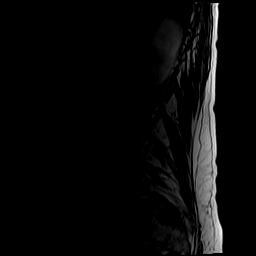
[im 8/23]
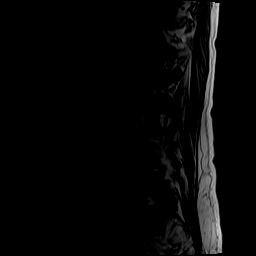
[im 12/23]
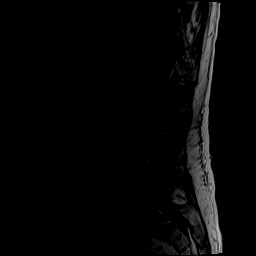
[im 15/23]
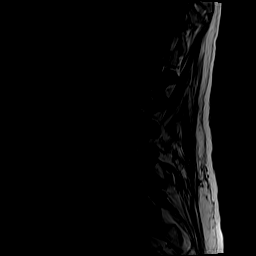
[im 19/23]
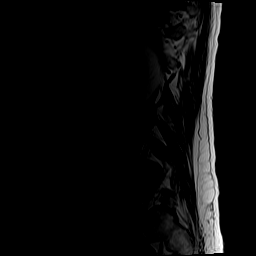
[im 23/23]
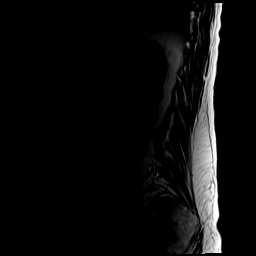

[Series 6: T2 · axial · 4.0mm · 0.39mm/px · z∈[-94,+126]mm · 8 of 45 slices shown (2 of 2)]
[im 1/45]
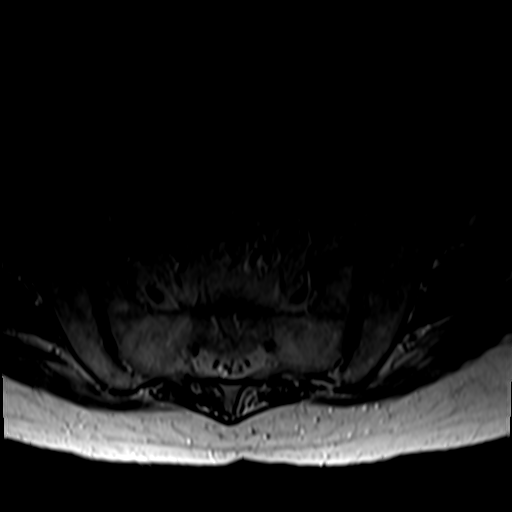
[im 7/45]
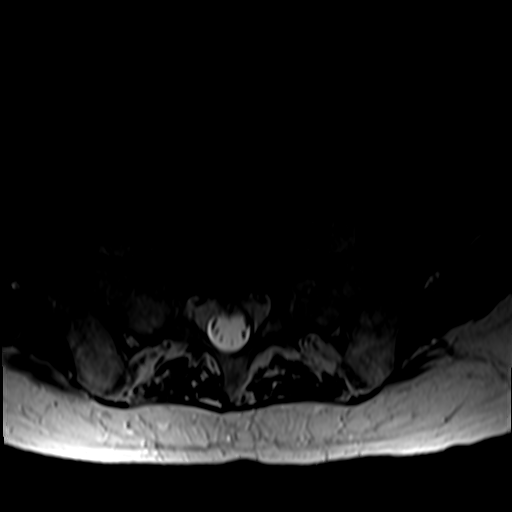
[im 14/45]
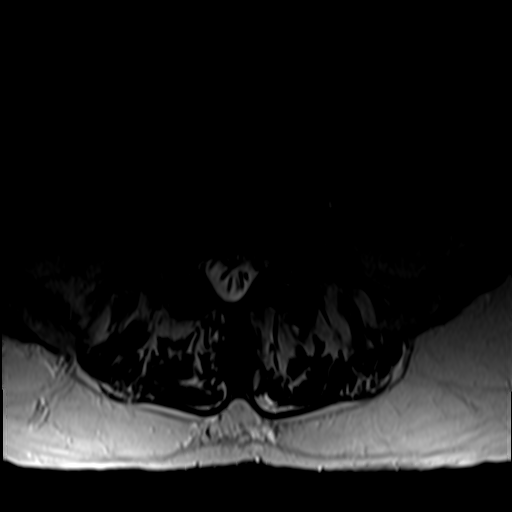
[im 21/45]
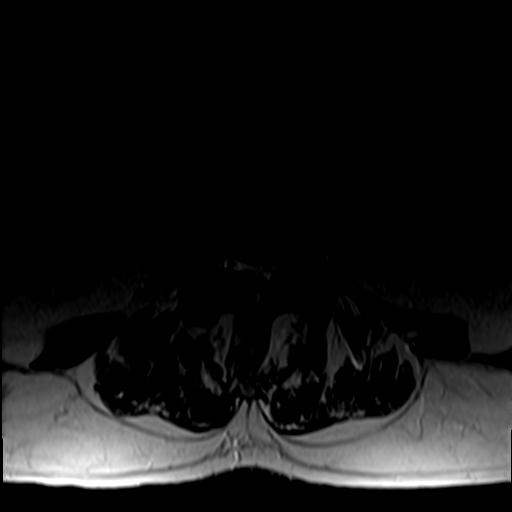
[im 24/45]
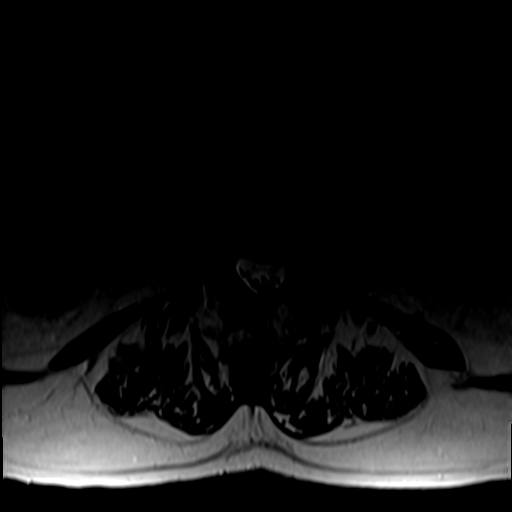
[im 31/45]
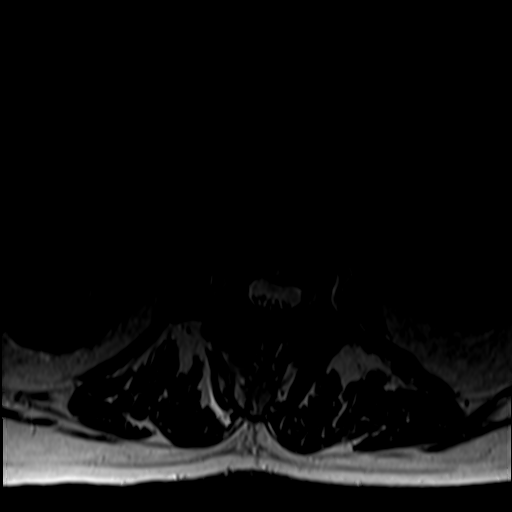
[im 38/45]
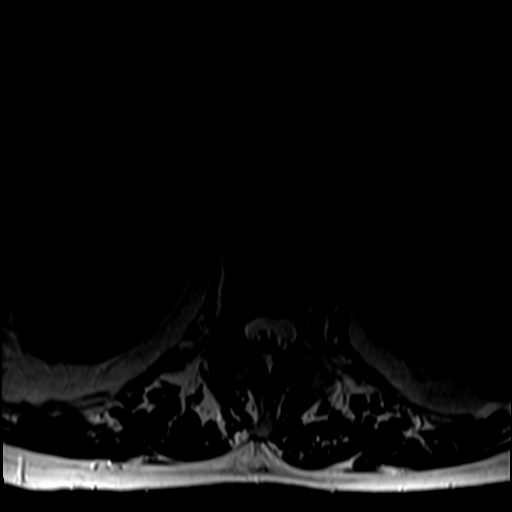
[im 45/45]
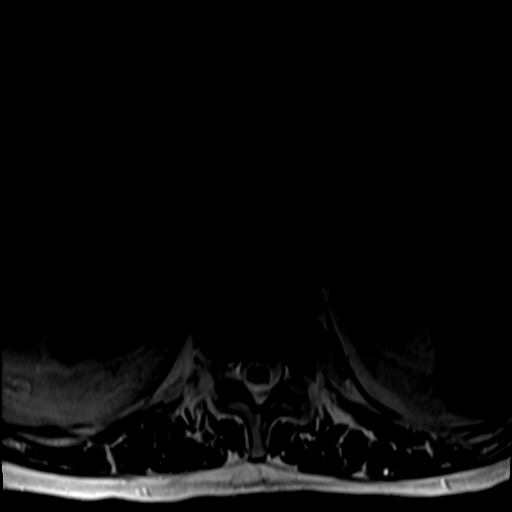

[Series 7: T1 · axial · 4.0mm · 0.39mm/px · z∈[-94,+93]mm · 4 of 45 slices shown (2 of 2)]
[im 1/45]
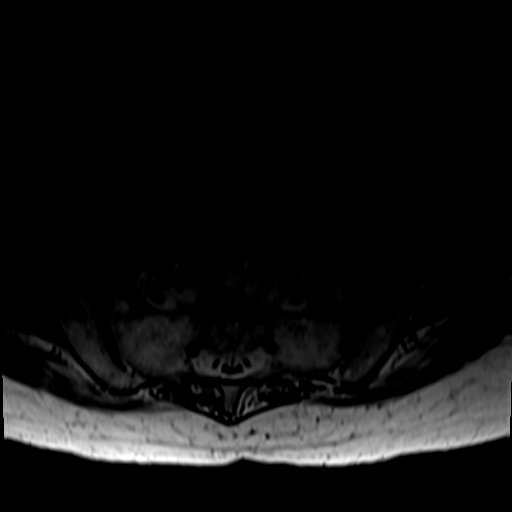
[im 7/45]
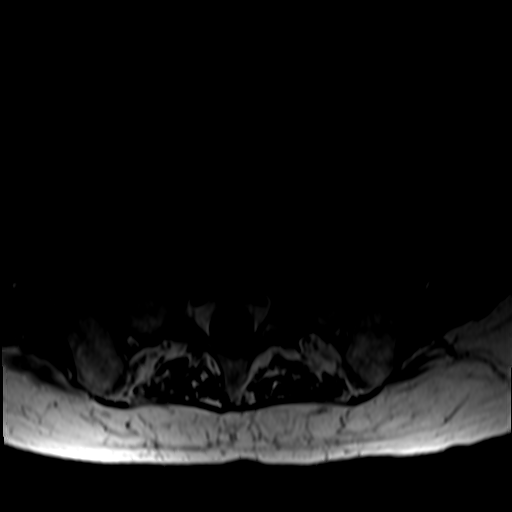
[im 24/45]
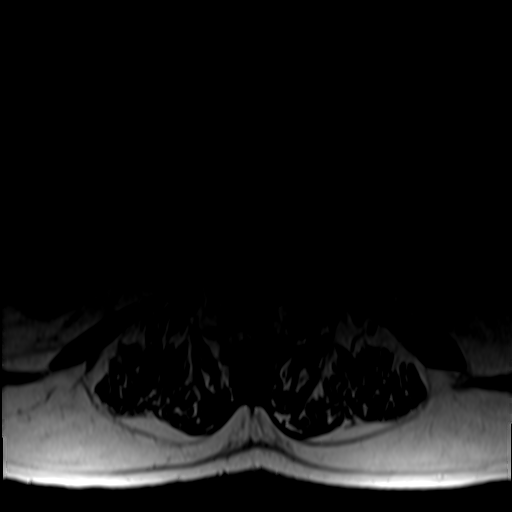
[im 38/45]
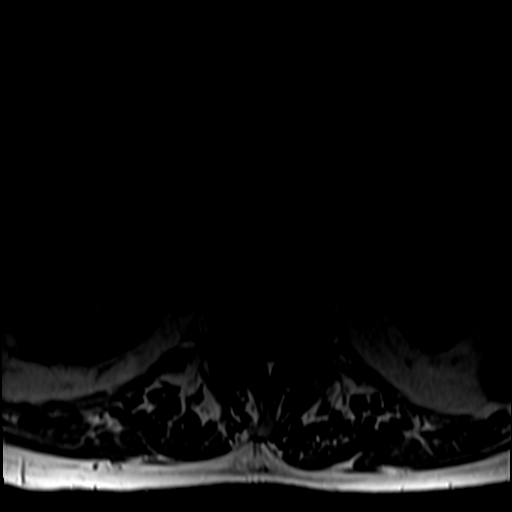

[25 of 48 positions shown; findings below may reference images not displayed]

FINDINGS: Segmentation: 5 lumbar vertebrae. The caudal most well-formed
intervertebral disc space is designated L5-S1.

Alignment: Lumbar dextrocurvature. Trace T12-L1, L1-L2, L2-L3 grade
1 retrolisthesis. Trace L4-L5 grade 1 anterolisthesis.

Vertebrae: Redemonstrated mild chronic T11 and T12 anterior wedge
compression deformities. Lumbar vertebral body height is maintained.
Patchy degenerative marrow edema within the posterior elements at L4
and L5. Elsewhere, no significant marrow edema or focal suspicious
osseous lesion is identified. Multilevel ventrolateral osteophytes.

Conus medullaris and cauda equina: Conus extends to the L1-L2 level.
No signal abnormality within the visualized distal spinal cord.

Paraspinal and other soft tissues: No abnormality identified within
included portions of the abdomen/retroperitoneum. Atrophy of the
lumbar paraspinal musculature.

Disc levels:

Multilevel disc degeneration. Most notably, there is moderate to
moderately advanced disc degeneration at L2-L3 and moderate disc
degeneration at T12-L1, L1-L2, L3-L4 and L4-L5.

T10-T11: Imaged sagittally. Small disc bulge. Superimposed tiny
right subarticular/foraminal disc protrusion. No more than mild
relative spinal canal narrowing. No significant foraminal stenosis.

T11-T12: Imaged sagittally. No significant disc herniation or
stenosis.

T12-L1: Trace retrolisthesis. Minimal disc bulge. Mild facet
arthrosis. No significant spinal canal stenosis. Mild right neural
foraminal narrowing.

L1-L2: Grade 1 retrolisthesis. Disc bulge. Mild facet arthrosis. No
significant spinal canal stenosis. Mild/moderate right neural
foraminal narrowing.

L2-L3: Grade 1 retrolisthesis. Disc bulge with endplate spurring.
Disc osteophyte ridge is more prominent within the right center to
right foraminal zone. Mild/moderate facet arthrosis with ligamentum
flavum hypertrophy. Moderate/severe right subarticular narrowing
with encroachment upon the descending right L3 nerve root (series 6,
image 19). Mild central canal stenosis. Bilateral neural foraminal
narrowing (mild/moderate right, mild left).

L3-L4: Disc bulge with endplate spurring. Facet arthrosis (moderate
right, advanced left) with ligamentum flavum hypertrophy. Moderate
right subarticular narrowing with encroachment upon the descending
right L4 nerve root (series 6, image 24). Mild relative narrowing of
the central canal. Moderate bilateral neural foraminal narrowing
(greater on the left).

L4-L5: Grade 1 anterolisthesis. Disc bulge. Superimposed broad-based
left center to left foraminal disc extrusion. Disc osteophyte ridge
along the left aspect of the disc space. Facet arthrosis (moderate
right, severe left) with ligamentum flavum hypertrophy. Severe left
subarticular stenosis with encroachment upon the descending left L5
nerve root. Mild right subarticular narrowing. Moderate to
moderately severe central canal stenosis. Bilateral neural foraminal
narrowing (moderate right, severe left).

L5-S1: Facet arthrosis (moderate right, severe left). No significant
disc herniation or stenosis.
IMPRESSION: Lumbar spondylosis as outlined and with findings most notably as
follows.

At L4-L5, there is mild grade 1 anterolisthesis. Disc bulge.
Superimposed broad-based left center to left foraminal disc
extrusion. Disc osteophyte ridge along the left aspect of the disc
space. Facet arthrosis (moderate right, severe left) with ligamentum
flavum hypertrophy. The disc extrusion contributes to severe left
subarticular stenosis with encroachment upon the descending left L5
nerve root. Mild right subarticular narrowing. Moderate to
moderately severe central canal stenosis. Bilateral neural foraminal
narrowing (moderate right, severe left).

At L2-L3, disc osteophyte ridge contributes to moderate/severe right
subarticular narrowing with encroachment upon the descending right
L3 nerve root. Multifactorial mild central canal stenosis and
bilateral neural foraminal narrowing (mild/moderate right, mild
left) so at this level.

At L3-L4, there is multifactorial moderate right subarticular
narrowing with encroachment upon the descending right L4 nerve root.
Multifactorial mild relative narrowing of the central canal and
moderate bilateral neural foraminal narrowing also at this level.

No significant spinal canal stenosis at the remaining levels. Sites
of lesser foraminal stenosis as detailed.

Redemonstrated mild chronic T11 and T12 vertebral compression
fractures.

## 2023-03-14 ENCOUNTER — Ambulatory Visit: Payer: PPO | Admitting: Nurse Practitioner

## 2023-05-09 ENCOUNTER — Encounter: Payer: PPO | Admitting: Internal Medicine

## 2023-05-22 ENCOUNTER — Encounter: Payer: PPO | Admitting: Internal Medicine

## 2023-08-14 ENCOUNTER — Ambulatory Visit: Payer: PPO | Admitting: Nurse Practitioner

## 2023-08-27 ENCOUNTER — Ambulatory Visit: Payer: PPO | Admitting: Nurse Practitioner

## 2023-10-03 ENCOUNTER — Ambulatory Visit: Admitting: Student

## 2023-10-03 ENCOUNTER — Other Ambulatory Visit: Payer: Self-pay

## 2023-10-03 ENCOUNTER — Encounter: Payer: Self-pay | Admitting: Student

## 2023-10-03 VITALS — BP 142/58 | HR 63 | Temp 97.6°F | Ht 65.0 in | Wt 140.6 lb

## 2023-10-03 DIAGNOSIS — N400 Enlarged prostate without lower urinary tract symptoms: Secondary | ICD-10-CM

## 2023-10-03 DIAGNOSIS — Z8249 Family history of ischemic heart disease and other diseases of the circulatory system: Secondary | ICD-10-CM

## 2023-10-03 DIAGNOSIS — E785 Hyperlipidemia, unspecified: Secondary | ICD-10-CM | POA: Diagnosis not present

## 2023-10-03 DIAGNOSIS — E782 Mixed hyperlipidemia: Secondary | ICD-10-CM

## 2023-10-03 DIAGNOSIS — H9193 Unspecified hearing loss, bilateral: Secondary | ICD-10-CM

## 2023-10-03 DIAGNOSIS — Z9079 Acquired absence of other genital organ(s): Secondary | ICD-10-CM

## 2023-10-03 DIAGNOSIS — Z87891 Personal history of nicotine dependence: Secondary | ICD-10-CM

## 2023-10-03 DIAGNOSIS — Z85118 Personal history of other malignant neoplasm of bronchus and lung: Secondary | ICD-10-CM

## 2023-10-03 DIAGNOSIS — G3184 Mild cognitive impairment, so stated: Secondary | ICD-10-CM | POA: Diagnosis not present

## 2023-10-03 DIAGNOSIS — K4030 Unilateral inguinal hernia, with obstruction, without gangrene, not specified as recurrent: Secondary | ICD-10-CM | POA: Diagnosis not present

## 2023-10-03 DIAGNOSIS — Z974 Presence of external hearing-aid: Secondary | ICD-10-CM

## 2023-10-03 NOTE — Assessment & Plan Note (Signed)
 Wears hearing aids.  No hearing deficits on today's exam.

## 2023-10-03 NOTE — Assessment & Plan Note (Signed)
 S/P TURP 2013. Has no urinary complaints.

## 2023-10-03 NOTE — Assessment & Plan Note (Addendum)
 Here today with his sister reports some trouble with short-term memory.  At times he forgets to write down appointments and has had some trouble operating the phone.  However overall he remains able to take care of ADLs, IADLs, his grooming, shopping, driving.  Does not require day-to-day help but his sister helps him with appointments. He is well-kempt in the office today and remembered this appointment.  On assessment there are mild cognitive deficits.  Word recall is intact, but some visual spatial deficits on clock draw.  I have uploaded a picture of the clock.  At next visit, recommend a MoCA assessment.  Can consider workup including vitamins and possible cranial imaging based on that and his ongoing functional status.  Pharmacologic intervention not recommended at this time.

## 2023-10-03 NOTE — Assessment & Plan Note (Addendum)
 History of this formally on statin therapy Crestor  10, now off medicine for a couple years.  Goal for him is primary prevention, LDL less than 100. - Lipid panel today, consider readministration of therapy pending

## 2023-10-03 NOTE — Assessment & Plan Note (Signed)
 Right upper lobe VATS 2008. Stopped smoking over 20 years ago.

## 2023-10-03 NOTE — Patient Instructions (Addendum)
 Today I will collect a blood cholesterol level  I do not recommend any new medicines at this time  Monitor your hernia, if it gets larger or causes consistent pain or becomes irritated by clothing, we can refer you to a surgeon for repair. Sudden severe pain or overlying skin changes would be a reason to go to the ER.  Please return for a general checkup in 6 months  Call any time for acute concerns  - Dr Harrie

## 2023-10-03 NOTE — Progress Notes (Addendum)
 CC: Establish Care  HPI:  Raymond Patrick is a 88 y.o. male with a PMH stated below who presents today for establishment of care.  He is here with his sister and they would like to discuss some recent short-term memory deficits and a hernia.  Please see problem based assessment and plan for additional details.  Past Medical History:  Diagnosis Date   Allergy    seasonal   Bladder tumor    Cancer (HCC)    bladder,, lung   COPD (chronic obstructive pulmonary disease) (HCC)    Diverticulosis of sigmoid colon    2016   History of kidney stones    History of lung cancer 2008  S/P RIGHT UPPER LOPECTOMY    STAGE I  NON-SMALL CELL LUNG CANCER--  ONCOLOGIST- DR SHERROD-   NO RECURRENCE   Hyperlipidemia    Hypertension    Impaired hearing bilateral aids   Irregular heart rate    per pt heart will skip a beat    Kidney stone 07/08/2019   Unspecified vitamin D  deficiency    Review of Systems: ROS negative except for what is noted on the assessment and plan.  Vitals:   10/03/23 1011 10/03/23 1015  BP: (!) 151/71 (!) 142/58  Pulse: 64 63  Temp: 97.6 F (36.4 C)   TempSrc: Oral   SpO2: 97%   Weight: 140 lb 9.6 oz (63.8 kg)   Height: 5' 5 (1.651 m)    Physical Exam: Constitutional: elderly well-appearing man in no acute distress. Good hygiene and grooming. Cardiovascular: regular rate and rhythm, no m/r/g Pulmonary/Chest: normal work of breathing on room air, lungs clear to auscultation bilaterally Abdominal: soft, non-tender, non-distended.  A large left inguinal hernia is present, approximately size of the lumen, is not reducible, there is mild tenderness when I press, there is no overlying skin changes or evidence of tissue ischemia. MSK: normal bulk and tone Neurological: alert & oriented x 3, no focal deficit Skin: warm and dry Psych: normal mood and behavior  Assessment & Plan:   Patient discussed with Dr. Trudy  Mild cognitive impairment Here today with his  sister reports some trouble with short-term memory.  At times he forgets to write down appointments and has had some trouble operating the phone.  However overall he remains able to take care of ADLs, IADLs, his grooming, shopping, driving.  Does not require day-to-day help but his sister helps him with appointments. He is well-kempt in the office today and remembered this appointment.  On assessment there are mild cognitive deficits.  Word recall is intact, but some visual spatial deficits on clock draw.  I have uploaded a picture of the clock.  At next visit, recommend a MoCA assessment.  Can consider workup including vitamins and possible cranial imaging based on that and his ongoing functional status.  Pharmacologic intervention not recommended at this time.    Inguinal hernia with irreducibility Left inguinal hernia that is not reducible but not strangulated.  Mild tenderness to pressure, but discomfort is minimal with day-to-day activity.  No overlying skin changes.  It is large, the size of a small lemon.  Arose 1 month ago without a clear etiology. - Will refer to general surgery to consider surgical repair  History of lung cancer Right upper lobe VATS 2008. Stopped smoking over 20 years ago.  BPH (benign prostatic hyperplasia) S/P TURP 2013. Has no urinary complaints.  Hyperlipidemia History of this formally on statin therapy Crestor  10, now off  medicine for a couple years.  Goal for him is primary prevention, LDL less than 100. - Lipid panel today, consider readministration of therapy pending  Bilateral hearing loss Wears hearing aids.  No hearing deficits on today's exam.  General Social and Functional History [name] lives on his own here in town.  He has a local niece and a sister, Raymond Patrick, present today lives in [place].  Was never married, has no children.  Tends to his yard and goes to the gym without trouble.  Drives, denies accidents.  Does his own shopping.  Does not  use a computer.  At times misses computer and does not answer the phone when his family calls him, and is known to fail to write down new appointments or recall recent changes such as his decision to close for the financial account.  Former Financial controller at [employer], now retired for 2 decades.  RTC in 6 months.  Consider MoCA assessment for mild cognitive impairment.  If ongoing concerns, consider evaluation thereof with vitamin levels, mood, and possibly imaging.  ADD: cholesterol high. Would likely benefit from statin, was on one in past. Would prefer to discuss before prescribing to identify his health goals. Unable to contact by phone. I have sent him a letter. I will talk to him by phone if he would like or can address at next visit.  Lonni Africa, D.O. [facility] Internal Medicine, PGY-2 Phone: [phone] Date 10/03/2023 Time 11:37 AM

## 2023-10-03 NOTE — Assessment & Plan Note (Addendum)
 Left inguinal hernia that is not reducible but not strangulated.  Mild tenderness to pressure, but discomfort is minimal with day-to-day activity.  No overlying skin changes.  It is large, the size of a small lemon.  Arose 1 month ago without a clear etiology. - Will refer to general surgery to consider surgical repair

## 2023-10-04 LAB — LIPID PANEL
Chol/HDL Ratio: 4.5 ratio (ref 0.0–5.0)
Cholesterol, Total: 204 mg/dL — ABNORMAL HIGH (ref 100–199)
HDL: 45 mg/dL (ref >39)
LDL Chol Calc (NIH): 141 mg/dL — ABNORMAL HIGH (ref 0–99)
Triglycerides: 101 mg/dL (ref 0–149)
VLDL Cholesterol Cal: 18 mg/dL (ref 5–40)

## 2023-10-12 NOTE — Progress Notes (Signed)
 Internal Medicine Clinic Attending  Case discussed with the resident at the time of the visit.  We reviewed the resident's history and exam and pertinent patient test results.  I agree with the assessment, diagnosis, and plan of care documented in the resident's note. Agree with further assessment for cognitive symptoms, to both clarify diagnosis and assess safety and appropriateness of living alone.

## 2023-10-26 ENCOUNTER — Ambulatory Visit: Payer: Self-pay | Admitting: Surgery

## 2023-11-20 ENCOUNTER — Ambulatory Visit: Payer: Self-pay | Admitting: Student

## 2023-11-20 DIAGNOSIS — E7849 Other hyperlipidemia: Secondary | ICD-10-CM

## 2023-12-03 ENCOUNTER — Telehealth: Payer: Self-pay | Admitting: *Deleted

## 2023-12-03 NOTE — Telephone Encounter (Unsigned)
 Copied from CRM #[id number]. Topic: Clinical - Medication Question >> Nov 30, 2023 12:26 PM Raymond Patrick wrote: Reason for CRM: Patient is calling in to let Dr. Harrie know that he would like the atorvastatin sent in to [facility] and he is okay with taking it.

## 2023-12-05 ENCOUNTER — Encounter (HOSPITAL_COMMUNITY): Admission: RE | Payer: Self-pay | Source: Home / Self Care

## 2023-12-05 ENCOUNTER — Ambulatory Visit (HOSPITAL_COMMUNITY): Admission: RE | Admit: 2023-12-05 | Source: Home / Self Care | Admitting: Surgery

## 2023-12-05 SURGERY — REPAIR, HERNIA, INGUINAL, ADULT
Anesthesia: General | Laterality: Left

## 2023-12-06 ENCOUNTER — Other Ambulatory Visit: Payer: Self-pay | Admitting: Student

## 2023-12-06 DIAGNOSIS — E785 Hyperlipidemia, unspecified: Secondary | ICD-10-CM

## 2023-12-06 MED ORDER — ROSUVASTATIN CALCIUM 10 MG PO TABS: 10.0000 mg | ORAL_TABLET | Freq: Every day | ORAL | 11 refills | Status: DC

## 2024-02-13 ENCOUNTER — Ambulatory Visit: Payer: Self-pay | Admitting: Student

## 2024-02-13 ENCOUNTER — Encounter: Payer: Self-pay | Admitting: Student

## 2024-02-13 VITALS — BP 159/76 | HR 75 | Temp 97.7°F | Ht 65.0 in | Wt 128.2 lb

## 2024-02-13 DIAGNOSIS — H9190 Unspecified hearing loss, unspecified ear: Secondary | ICD-10-CM

## 2024-02-13 DIAGNOSIS — Z23 Encounter for immunization: Secondary | ICD-10-CM | POA: Diagnosis not present

## 2024-02-13 DIAGNOSIS — I10 Essential (primary) hypertension: Secondary | ICD-10-CM

## 2024-02-13 DIAGNOSIS — E785 Hyperlipidemia, unspecified: Secondary | ICD-10-CM

## 2024-02-13 DIAGNOSIS — F03B0 Unspecified dementia, moderate, without behavioral disturbance, psychotic disturbance, mood disturbance, and anxiety: Secondary | ICD-10-CM | POA: Diagnosis not present

## 2024-02-13 MED ORDER — AMLODIPINE BESYLATE 5 MG PO TABS: 5.0000 mg | ORAL_TABLET | Freq: Every day | ORAL | 11 refills | Status: AC

## 2024-02-13 MED ORDER — ROSUVASTATIN CALCIUM 10 MG PO TABS: 10.0000 mg | ORAL_TABLET | Freq: Every day | ORAL | 11 refills | Status: AC
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
## 2024-02-13 NOTE — Assessment & Plan Note (Signed)
 Blood pressure is high.  For that it is appropriate to treat. -Amlodipine  5 daily

## 2024-02-14 ENCOUNTER — Other Ambulatory Visit: Payer: Self-pay | Admitting: Student

## 2024-02-14 ENCOUNTER — Ambulatory Visit: Payer: Self-pay | Admitting: Student

## 2024-02-14 DIAGNOSIS — E538 Deficiency of other specified B group vitamins: Secondary | ICD-10-CM | POA: Insufficient documentation

## 2024-02-14 LAB — TSH: TSH: 1.07 u[IU]/mL (ref 0.450–4.500)

## 2024-02-14 LAB — VITAMIN B12: Vitamin B-12: 188 pg/mL — ABNORMAL LOW (ref 232–1245)

## 2024-02-14 MED ORDER — DONEPEZIL HCL 5 MG PO TABS: 5.0000 mg | ORAL_TABLET | Freq: Every day | ORAL | 2 refills | Status: AC

## 2024-02-14 MED ORDER — B-12 2000 MCG PO TABS: 1.0000 | ORAL_TABLET | Freq: Every day | ORAL | Status: AC

## 2024-02-26 NOTE — Progress Notes (Signed)
 Internal Medicine Clinic Attending  Case discussed with the resident at the time of the visit.  We reviewed the resident's history and exam and pertinent patient test results.  I agree with the assessment, diagnosis, and plan of care documented in the resident's note.

## 2024-02-28 ENCOUNTER — Ambulatory Visit (HOSPITAL_BASED_OUTPATIENT_CLINIC_OR_DEPARTMENT_OTHER)

## 2024-02-29 ENCOUNTER — Telehealth: Payer: Self-pay | Admitting: *Deleted

## 2024-02-29 NOTE — Telephone Encounter (Signed)
 Patient's sister came by the Clinics today to see if she could start the process for the patient to be placed in a long term facility due to Memory loss.  Patient has lost lots of money recently and is unable to take care of himself.  The [employer] where he had a Long Term Care Policy will be sending forms to be completed so that this issue will be covered for the patient.  Patient's Power of Attorney Raymond Patrick will be involved. Patient's sister just wanted to make Dr. Harrie aware of the forms and need for documentation that will be coming up soon for the patient's condition.  Raymond Patrick can be reached at [phone] to talk with you to get further details on what will be needed.

## 2024-02-29 NOTE — Telephone Encounter (Unsigned)
 Copied from CRM #[id number]. Topic: General - Other >> Feb 27, 2024  1:55 PM Mercer PEDLAR wrote: Reason for CRM: Arland (Sister) is requesting a callback to discuss long-term care insuarance paperwork which needs to be completed by patient's PCP.   Callback: [phone]

## 2024-03-04 NOTE — Telephone Encounter (Signed)
 I contacted pt's sister Arland Ferrier regarding a form from [employer], pt's stister states at this time there's no form for the doctor to complete. Pt's sister states she has to do the paperwork to re-instate the insurance for the patient first, and will let us  know if the provider need to complete anything for him. Advised pt's sister to call back if she needs further assistance.

## 2024-04-04 ENCOUNTER — Ambulatory Visit: Payer: Self-pay | Admitting: Student
